# Patient Record
Sex: Female | Born: 1945 | Race: Black or African American | Hispanic: No | Smoking: Never smoker
Health system: Southern US, Community
[De-identification: ages and names within clinical notes are randomized; demographics above are authoritative.]

## PROBLEM LIST (undated history)

## (undated) DIAGNOSIS — M797 Fibromyalgia: Secondary | ICD-10-CM

## (undated) DIAGNOSIS — R000 Tachycardia, unspecified: Secondary | ICD-10-CM

## (undated) DIAGNOSIS — B029 Zoster without complications: Secondary | ICD-10-CM

## (undated) DIAGNOSIS — K219 Gastro-esophageal reflux disease without esophagitis: Secondary | ICD-10-CM

## (undated) DIAGNOSIS — G4730 Sleep apnea, unspecified: Secondary | ICD-10-CM

## (undated) DIAGNOSIS — G8929 Other chronic pain: Secondary | ICD-10-CM

## (undated) DIAGNOSIS — G4733 Obstructive sleep apnea (adult) (pediatric): Secondary | ICD-10-CM

## (undated) DIAGNOSIS — M1990 Unspecified osteoarthritis, unspecified site: Secondary | ICD-10-CM

## (undated) DIAGNOSIS — I10 Essential (primary) hypertension: Secondary | ICD-10-CM

## (undated) DIAGNOSIS — E119 Type 2 diabetes mellitus without complications: Secondary | ICD-10-CM

## (undated) DIAGNOSIS — H6690 Otitis media, unspecified, unspecified ear: Secondary | ICD-10-CM

## (undated) DIAGNOSIS — J45909 Unspecified asthma, uncomplicated: Secondary | ICD-10-CM

## (undated) DIAGNOSIS — J40 Bronchitis, not specified as acute or chronic: Secondary | ICD-10-CM

## (undated) HISTORY — PX: CHOLECYSTECTOMY: SHX55

## (undated) HISTORY — DX: Other chronic pain: G89.29

## (undated) HISTORY — DX: Fibromyalgia: M79.7

## (undated) HISTORY — DX: Sleep apnea, unspecified: G47.30

## (undated) HISTORY — DX: Obstructive sleep apnea (adult) (pediatric): G47.33

## (undated) HISTORY — PX: APPENDECTOMY: SHX54

## (undated) HISTORY — PX: FOOT SURGERY: SHX648

## (undated) HISTORY — DX: Type 2 diabetes mellitus without complications: E11.9

## (undated) HISTORY — DX: Zoster without complications: B02.9

## (undated) HISTORY — PX: ABDOMINAL HYSTERECTOMY: SHX81

## (undated) HISTORY — DX: Unspecified asthma, uncomplicated: J45.909

## (undated) HISTORY — PX: OTHER SURGICAL HISTORY: SHX169

## (undated) HISTORY — DX: Bronchitis, not specified as acute or chronic: J40

## (undated) HISTORY — DX: Gastro-esophageal reflux disease without esophagitis: K21.9

## (undated) HISTORY — PX: PARTIAL HYSTERECTOMY: SHX80

---

## 1988-06-30 HISTORY — PX: PARTIAL COLECTOMY: SHX5273

## 1990-06-30 HISTORY — PX: CERVICAL SPINE SURGERY: SHX589

## 1993-06-30 HISTORY — PX: NEUROPLASTY / TRANSPOSITION MEDIAN NERVE AT CARPAL TUNNEL BILATERAL: SUR894

## 1993-06-30 HISTORY — PX: CHOLECYSTECTOMY: SHX55

## 1993-06-30 HISTORY — PX: APPENDECTOMY: SHX54

## 1993-06-30 HISTORY — PX: BILATERAL SALPINGOOPHORECTOMY: SHX1223

## 1993-06-30 HISTORY — PX: PARTIAL COLECTOMY: SHX5273

## 1997-10-03 ENCOUNTER — Other Ambulatory Visit: Admission: RE | Admit: 1997-10-03 | Discharge: 1997-10-03 | Payer: Self-pay | Admitting: *Deleted

## 1998-06-30 HISTORY — PX: OTHER SURGICAL HISTORY: SHX169

## 1998-10-23 ENCOUNTER — Other Ambulatory Visit: Admission: RE | Admit: 1998-10-23 | Discharge: 1998-10-23 | Payer: Self-pay | Admitting: *Deleted

## 1999-10-22 ENCOUNTER — Encounter: Admission: RE | Admit: 1999-10-22 | Discharge: 1999-10-22 | Payer: Self-pay | Admitting: Unknown Physician Specialty

## 1999-10-22 ENCOUNTER — Encounter: Payer: Self-pay | Admitting: Unknown Physician Specialty

## 1999-11-07 ENCOUNTER — Encounter: Payer: Self-pay | Admitting: Internal Medicine

## 1999-11-07 ENCOUNTER — Encounter: Admission: RE | Admit: 1999-11-07 | Discharge: 1999-11-07 | Payer: Self-pay | Admitting: Internal Medicine

## 1999-12-23 ENCOUNTER — Encounter: Admission: RE | Admit: 1999-12-23 | Discharge: 1999-12-23 | Payer: Self-pay | Admitting: Internal Medicine

## 1999-12-23 ENCOUNTER — Encounter: Payer: Self-pay | Admitting: Internal Medicine

## 2000-05-26 ENCOUNTER — Encounter: Admission: RE | Admit: 2000-05-26 | Discharge: 2000-05-26 | Payer: Self-pay | Admitting: Orthopedic Surgery

## 2000-05-26 ENCOUNTER — Encounter: Payer: Self-pay | Admitting: Orthopedic Surgery

## 2000-11-09 ENCOUNTER — Encounter: Payer: Self-pay | Admitting: Internal Medicine

## 2000-11-09 ENCOUNTER — Encounter: Admission: RE | Admit: 2000-11-09 | Discharge: 2000-11-09 | Payer: Self-pay | Admitting: Internal Medicine

## 2001-11-16 ENCOUNTER — Encounter: Admission: RE | Admit: 2001-11-16 | Discharge: 2001-11-16 | Payer: Self-pay | Admitting: Unknown Physician Specialty

## 2001-11-16 ENCOUNTER — Encounter: Payer: Self-pay | Admitting: Unknown Physician Specialty

## 2002-11-19 ENCOUNTER — Emergency Department (HOSPITAL_COMMUNITY): Admission: EM | Admit: 2002-11-19 | Discharge: 2002-11-19 | Payer: Self-pay | Admitting: Emergency Medicine

## 2002-11-19 ENCOUNTER — Encounter: Payer: Self-pay | Admitting: Emergency Medicine

## 2002-11-22 ENCOUNTER — Encounter: Payer: Self-pay | Admitting: Internal Medicine

## 2003-06-01 ENCOUNTER — Encounter: Admission: RE | Admit: 2003-06-01 | Discharge: 2003-06-01 | Payer: Self-pay | Admitting: Internal Medicine

## 2003-06-28 ENCOUNTER — Ambulatory Visit (HOSPITAL_COMMUNITY): Admission: RE | Admit: 2003-06-28 | Discharge: 2003-06-28 | Payer: Self-pay | Admitting: Cardiology

## 2004-05-10 ENCOUNTER — Encounter: Admission: RE | Admit: 2004-05-10 | Discharge: 2004-05-10 | Payer: Self-pay | Admitting: Internal Medicine

## 2004-06-20 ENCOUNTER — Ambulatory Visit (HOSPITAL_BASED_OUTPATIENT_CLINIC_OR_DEPARTMENT_OTHER): Admission: RE | Admit: 2004-06-20 | Discharge: 2004-06-20 | Payer: Self-pay | Admitting: Internal Medicine

## 2004-07-26 ENCOUNTER — Encounter: Admission: RE | Admit: 2004-07-26 | Discharge: 2004-07-26 | Payer: Self-pay | Admitting: Internal Medicine

## 2004-09-14 ENCOUNTER — Inpatient Hospital Stay (HOSPITAL_COMMUNITY): Admission: EM | Admit: 2004-09-14 | Discharge: 2004-09-18 | Payer: Self-pay | Admitting: Emergency Medicine

## 2004-09-25 ENCOUNTER — Ambulatory Visit: Payer: Self-pay | Admitting: Internal Medicine

## 2004-12-20 ENCOUNTER — Ambulatory Visit: Payer: Self-pay | Admitting: Internal Medicine

## 2005-04-18 ENCOUNTER — Ambulatory Visit: Payer: Self-pay | Admitting: Internal Medicine

## 2005-04-22 ENCOUNTER — Ambulatory Visit: Payer: Self-pay | Admitting: Internal Medicine

## 2005-07-21 ENCOUNTER — Encounter: Admission: RE | Admit: 2005-07-21 | Discharge: 2005-07-21 | Payer: Self-pay | Admitting: Internal Medicine

## 2005-10-08 ENCOUNTER — Encounter: Admission: RE | Admit: 2005-10-08 | Discharge: 2005-10-08 | Payer: Self-pay | Admitting: Internal Medicine

## 2005-12-22 ENCOUNTER — Ambulatory Visit: Payer: Self-pay | Admitting: Internal Medicine

## 2006-04-24 ENCOUNTER — Ambulatory Visit: Payer: Self-pay | Admitting: Internal Medicine

## 2006-11-09 ENCOUNTER — Ambulatory Visit: Payer: Self-pay | Admitting: Internal Medicine

## 2007-01-28 ENCOUNTER — Encounter: Admission: RE | Admit: 2007-01-28 | Discharge: 2007-01-28 | Payer: Self-pay | Admitting: Internal Medicine

## 2007-03-11 ENCOUNTER — Ambulatory Visit: Payer: Self-pay | Admitting: Internal Medicine

## 2007-05-14 ENCOUNTER — Ambulatory Visit: Payer: Self-pay | Admitting: Internal Medicine

## 2007-10-11 ENCOUNTER — Ambulatory Visit: Payer: Self-pay | Admitting: Internal Medicine

## 2007-10-11 DIAGNOSIS — J4521 Mild intermittent asthma with (acute) exacerbation: Secondary | ICD-10-CM | POA: Insufficient documentation

## 2007-10-11 HISTORY — DX: Mild intermittent asthma with (acute) exacerbation: J45.21

## 2007-10-13 ENCOUNTER — Telehealth: Payer: Self-pay | Admitting: Internal Medicine

## 2007-10-17 DIAGNOSIS — J302 Other seasonal allergic rhinitis: Secondary | ICD-10-CM | POA: Insufficient documentation

## 2007-10-17 DIAGNOSIS — E119 Type 2 diabetes mellitus without complications: Secondary | ICD-10-CM | POA: Insufficient documentation

## 2007-10-17 DIAGNOSIS — K219 Gastro-esophageal reflux disease without esophagitis: Secondary | ICD-10-CM | POA: Insufficient documentation

## 2007-10-17 DIAGNOSIS — G4733 Obstructive sleep apnea (adult) (pediatric): Secondary | ICD-10-CM

## 2007-10-17 DIAGNOSIS — J4520 Mild intermittent asthma, uncomplicated: Secondary | ICD-10-CM | POA: Insufficient documentation

## 2007-10-17 DIAGNOSIS — J3089 Other allergic rhinitis: Secondary | ICD-10-CM

## 2007-10-17 HISTORY — DX: Other seasonal allergic rhinitis: J30.2

## 2007-10-17 HISTORY — DX: Mild intermittent asthma, uncomplicated: J45.20

## 2007-10-17 HISTORY — DX: Gastro-esophageal reflux disease without esophagitis: K21.9

## 2007-11-09 ENCOUNTER — Ambulatory Visit: Payer: Self-pay | Admitting: Internal Medicine

## 2008-01-31 ENCOUNTER — Encounter: Admission: RE | Admit: 2008-01-31 | Discharge: 2008-01-31 | Payer: Self-pay | Admitting: Internal Medicine

## 2008-03-24 ENCOUNTER — Ambulatory Visit: Payer: Self-pay | Admitting: Internal Medicine

## 2008-09-08 ENCOUNTER — Ambulatory Visit: Payer: Self-pay | Admitting: Internal Medicine

## 2008-09-19 ENCOUNTER — Ambulatory Visit (HOSPITAL_BASED_OUTPATIENT_CLINIC_OR_DEPARTMENT_OTHER): Admission: RE | Admit: 2008-09-19 | Discharge: 2008-09-20 | Payer: Self-pay | Admitting: Orthopaedic Surgery

## 2009-02-05 ENCOUNTER — Encounter: Admission: RE | Admit: 2009-02-05 | Discharge: 2009-02-05 | Payer: Self-pay | Admitting: Internal Medicine

## 2009-04-04 ENCOUNTER — Ambulatory Visit: Payer: Self-pay | Admitting: Internal Medicine

## 2009-04-05 ENCOUNTER — Ambulatory Visit: Payer: Self-pay | Admitting: Internal Medicine

## 2009-07-06 ENCOUNTER — Encounter: Admission: RE | Admit: 2009-07-06 | Discharge: 2009-07-06 | Payer: Self-pay | Admitting: Internal Medicine

## 2010-02-19 ENCOUNTER — Ambulatory Visit: Payer: Self-pay | Admitting: Internal Medicine

## 2010-02-20 ENCOUNTER — Encounter: Admission: RE | Admit: 2010-02-20 | Discharge: 2010-02-20 | Payer: Self-pay | Admitting: Internal Medicine

## 2010-03-28 ENCOUNTER — Telehealth: Payer: Self-pay | Admitting: Internal Medicine

## 2010-04-20 ENCOUNTER — Encounter: Payer: Self-pay | Admitting: Internal Medicine

## 2010-07-21 ENCOUNTER — Encounter: Payer: Self-pay | Admitting: Orthopaedic Surgery

## 2010-07-21 ENCOUNTER — Encounter: Payer: Self-pay | Admitting: Internal Medicine

## 2010-07-30 NOTE — Assessment & Plan Note (Signed)
Summary: NP follow up - discuss spirometry results   Primary Provider/Referring Provider:  [name]  CC:  abnormal spirometry at walk in clinic .  History of Present Illness: 03/24/08- 65 yo woman returning for f/u. Continues allergy vaccine at 0.3 ml of 1:50. OK at this dose. Highter doses give local reaction. Notices post nasal drip and some sniffing, but nothing purulent, no wheeze and no blood. Sleep issues impacted by need to care for demented husband in hospital. Much stress. She has been sitting up at bedside and often without her cpap. Fragmented sleep.  April 04, 2009- Yearly foillow up visit-brusing at injection site for allergy vaccine.  Needs allergy vaccine refilled.  Has noted easier bruising in general over last several months, not just with allergy shots.Has been giving her own shots for years, holding at 0.3 ml/vial/week of 1:50. Had sinus infection, got Zpak, not quite enough-residual stuffy head congestion, drainage, ear pressure.  February 19, 2010--Presents for an work in visit. Complains of had spirometry at walk-in clinic and was told that she has mild obstruction - Spirometry from walk in clinic showed FEV1 103%. She is very concerned b/c they said she had an obstruction. We repeated this today, she had alot of difficulty with the instructions, FEV1 appeared fine w/ >100%. . She had some sinus symptoms that have now resolved. Denies chest pain, dyspnea, orthopnea, hemoptysis, fever, n/v/d, edema, headache, wheezing.      Medications Prior to Update: 1)  Lyrica 50 Mg  Caps (Pregabalin) .... One Tablet 4 Times Daily 2)  Lopressor 100 Mg  Tabs (Metoprolol Tartrate) .... One Tablet Twice Daily 3)  Hyzaar 50-12.5 Mg  Tabs (Losartan Potassium-Hctz) .... Once Daily 4)  Bayer Aspirin Ec Low Dose 81 Mg  Tbec (Aspirin) .... Take 1 Tablet By Mouth Once A Day 5)  Nexium 40 Mg  Cpdr (Esomeprazole Magnesium) .... Take 1 Tablet By Mouth Once A Day 6)  Calcium 500/d 500-200  Mg-Unit  Tabs (Calcium Carbonate-Vitamin D) .... Take 1 Tablet By Mouth Once A Day 7)  Complete Energy   Tabs (Multiple Vitamins-Minerals) .... Take 1 Tablet By Mouth Once A Day 8)  Flexeril 10 Mg  Tabs (Cyclobenzaprine Hcl) .... As Needed 9)  Diclofenac Sodium 75 Mg  Tbec (Diclofenac Sodium) .... As Needed 10)  Ultram 50 Mg  Tabs (Tramadol Hcl) .... As Needed 11)  Cpap Machine .Marland Kitchen.. 12 12)  Allergy Vaccine Go 0.3 of 1:50 (W-E) 13)  Epipen 0.3 Mg/0.33ml (1:1000)  Devi (Epinephrine Hcl (Anaphylaxis)) .... For Severe Allergic Reaction 14)  Fluticasone Propionate 50 Mcg/act Susp (Fluticasone Propionate) .Marland Kitchen.. 1-2 Sprays Each Nostril Daily 15)  Metformin Hcl 500 Mg Tabs (Metformin Hcl) .... Take 1 By Mouth Two Times A Day 16)  Zithromax Z-Pak 250 Mg Tabs (Azithromycin) .... 2 Today Then One Daily  Current Medications (verified): 1)  Lyrica 50 Mg  Caps (Pregabalin) .... One Tablet 4 Times Daily 2)  Lopressor 100 Mg  Tabs (Metoprolol Tartrate) .... One Tablet Twice Daily 3)  Hyzaar 50-12.5 Mg  Tabs (Losartan Potassium-Hctz) .... Once Daily 4)  Bayer Aspirin Ec Low Dose 81 Mg  Tbec (Aspirin) .... Take 1 Tablet By Mouth Once A Day 5)  Nexium 40 Mg  Cpdr (Esomeprazole Magnesium) .... Take 1 Tablet By Mouth Once A Day 6)  Calcium 500/d 500-200 Mg-Unit  Tabs (Calcium Carbonate-Vitamin D) .... Take 1 Tablet By Mouth Once A Day 7)  Complete Energy   Tabs (Multiple Vitamins-Minerals) .... Take 1  Tablet By Mouth Once A Day 8)  Flexeril 10 Mg  Tabs (Cyclobenzaprine Hcl) .... As Needed 9)  Diclofenac Sodium 75 Mg  Tbec (Diclofenac Sodium) .... As Needed 10)  Ultram 50 Mg  Tabs (Tramadol Hcl) .... As Needed 11)  Cpap Machine .Marland Kitchen.. 12 12)  Allergy Vaccine Go 0.3 of 1:50 (W-E) 13)  Epipen 0.3 Mg/0.35ml (1:1000)  Devi (Epinephrine Hcl (Anaphylaxis)) .... For Severe Allergic Reaction 14)  Fluticasone Propionate 50 Mcg/act Susp (Fluticasone Propionate) .Marland Kitchen.. 1-2 Sprays Each Nostril Daily 15)  Metformin Hcl 500 Mg Tabs  (Metformin Hcl) .... Take 1 By Mouth Two Times A Day 16)  Fish Oil 1000 Mg Caps (Omega-3 Fatty Acids) .... Take 1 Capsule By Mouth Once A Day 17)  Metamucil 0.52 Gm Caps (Psyllium) .... Take 1 Capsule By Mouth Once A Day 18)  Lipitor 10 Mg Tabs (Atorvastatin Calcium) .... Take 1 Tab By Mouth At Bedtime  Allergies (verified): 1)  ! Compazine 2)  ! Percodan 3)  ! 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 adhesions- benign Appendectomy Cholecystectomy right knee arthroscopy right foot surgery bilateral carpal tunnel  Family History: Last updated: 04-10-09 Mother- deceased age 41; stroke/dm Father- died age 53; prostate cancer, Artihritis, multiple myeloma Sibling 1- living age 42  Social History: Last updated: 04-10-09 Patient never smoked.  Widowed with 1 children   Risk Factors: Smoking Status: never (10/11/2007)  Review of Systems      See HPI  Vital Signs:  Patient profile:   65 year old female Height:      59 inches Weight:      165.38 pounds BMI:     33.52 O2 Sat:      95 % on Room air Temp:     98.1 degrees F oral Pulse rate:   75 / minute BP sitting:   124 / 64  (left arm) Cuff size:   regular  Vitals Entered By: Boone Master CNA/MA (February 19, 2010 3:41 PM)  O2 Flow:  Room air CC: abnormal spirometry at walk in clinic  Is Patient Diabetic? Yes Comments Medications reviewed with patient Daytime contact number verified with patient. Boone Master CNA/MA  February 19, 2010 3:41 PM    Physical Exam  Additional Exam:  General: A/Ox3; pleasant and cooperative, NAD, obese SKIN: no rash, lesions NODES: no lymphadenopathy HEENT: Millard/AT, EOM- WNL,  Conjuctivae- clear, PERRLA, TM-left is retractedL, , Throat- clear and wnl, Melampatti II-III NECK: Supple w/ fair ROM, JVD- none, normal carotid impulses w/o bruits  CHEST: Clear to P&A HEART: RRR, no m/g/r heard ABDOMEN: Soft and nl; ZOX:WRUE, nl pulses, no edema  NEURO: Grossly intact to observation      Pulmonary Function Test Date: 02/19/2010 4:03 PM Gender: Female  Pre-Spirometry FVC    Value: 2.28 L/min   % Pred: 115.20 % FEV1    Value: 1.81 L     Pred: 1.53 L     % Pred: 118.40 % FEV1/FVC  Value: 79.75 %     % Pred: 101.50 %  Impression & Recommendations:  Problem # 1:  ASTHMA (ICD-493.90) Lung fxn appears ok  w/ FEV1 >100% no apparent flare.  discussed results with pt.   Problem # 2:  ALLERGIC RHINITIS (ICD-477.9) mild flare Clarinex  or Zyrtec  at bedtime as needed drainage Saline nasal rinse as needed  Veramyst or Nasonex 1 puff two times a day  Please contact office for sooner follow up if symptoms do not improve or worsen  Her updated medication list for this problem includes:    Fluticasone Propionate 50 Mcg/act Susp (Fluticasone propionate) .Marland Kitchen... 1-2 sprays each nostril daily  Medications Added to Medication List This Visit: 1)  Fish Oil 1000 Mg Caps (Omega-3 fatty acids) .... Take 1 capsule by mouth once a day 2)  Metamucil 0.52 Gm Caps (Psyllium) .... Take 1 capsule by mouth once a day 3)  Lipitor 10 Mg Tabs (Atorvastatin calcium) .... Take 1 tab by mouth at bedtime  Complete Medication List: 1)  Lyrica 50 Mg Caps (Pregabalin) .... One tablet 4 times daily 2)  Lopressor 100 Mg Tabs (Metoprolol tartrate) .... One tablet twice daily 3)  Hyzaar 50-12.5 Mg Tabs (Losartan potassium-hctz) .... Once daily 4)  Bayer Aspirin Ec Low Dose 81 Mg Tbec (Aspirin) .... Take 1 tablet by mouth once a day 5)  Nexium 40 Mg Cpdr (Esomeprazole magnesium) .... Take 1 tablet by mouth once a day 6)  Calcium 500/d 500-200 Mg-unit Tabs (Calcium carbonate-vitamin d) .... Take 1  tablet by mouth once a day 7)  Complete Energy Tabs (Multiple vitamins-minerals) .... Take 1 tablet by mouth once a day 8)  Flexeril 10 Mg Tabs (Cyclobenzaprine hcl) .... As needed 9)  Diclofenac Sodium 75 Mg Tbec (Diclofenac sodium) .... As needed 10)  Ultram 50 Mg Tabs (Tramadol hcl) .... As needed 11)  Cpap Machine  .Marland Kitchen.. 12 12)  Allergy Vaccine Go 0.3 of 1:50 (w-e)  13)  Epipen 0.3 Mg/0.18ml (1:1000) Devi (Epinephrine hcl (anaphylaxis)) .... For severe allergic reaction 14)  Fluticasone Propionate 50 Mcg/act Susp (Fluticasone propionate) .Marland Kitchen.. 1-2 sprays each nostril daily 15)  Metformin Hcl 500 Mg Tabs (Metformin hcl) .... Take 1 by mouth two times a day 16)  Fish Oil 1000 Mg Caps (Omega-3 fatty acids) .... Take 1 capsule by mouth once a day 17)  Metamucil 0.52 Gm Caps (Psyllium) .... Take 1 capsule by mouth once a day 18)  Lipitor 10 Mg Tabs (Atorvastatin calcium) .... Take 1 tab by mouth at bedtime  Other Orders: Est. Patient Level III (16109)  Patient Instructions: 1)  Clarinex or Zyrtec  at bedtime as needed drainage 2)  Saline nasal rinse as needed  3)  Veramyst or Nasonex 1 puff two times a day  4)  Please contact office for sooner follow up if symptoms do not improve or worsen    Immunization History:  Influenza Immunization History:    Influenza:  historical (04/30/2009)   CardioPerfect Spirometry  ID: [id number] Patient: Mindy Ingram, BEED DOB: 29-Jul-1945 Age: 65 Years Old Sex: Female Race: Black Physician: Rubye Oaks NP Height: 59 Weight: 165.38 Status: Unconfirmed Past Medical History:  AODM (ICD-250.00) OBSTRUCTIVE SLEEP APNEA (ICD-327.23) GERD (ICD-530.81) ASTHMA (ICD-493.90) ALLERGIC RHINITIS (ICD-477.9) BRONCHITIS (ICD-490)   Recorded: 02/19/2010 4:03 PM  Parameter  Measured Predicted %Predicted FVC     2.28        1.97        115.20 FEV1     1.81        1.53        118.40 FEV1%   79.75        78.61        101.50 PEF    4.51        4.49  100.50   Interpretation:

## 2010-07-30 NOTE — Progress Notes (Signed)
Summary: *****return nurse call****  Phone Note Call from Patient Call back at Home Phone [phone]   Caller: Patient Call For: young Reason for Call: Talk to Nurse Summary of Call: Need a new rx for cpap machine and supplies, also need 2 vials of allergy vaccine mailed to her home. Initial call taken by: Darletta Moll,  March 28, 2010 3:31 PM  Follow-up for Phone Call        [employer]  Need to know name of DME company.  Aundra Millet Reynolds LPN  March 28, 2010 3:59 PM   DME COMPANY IS [facility] SHE IS ELIGABLE FOR A NEW MACHINE AND THE FIXINS.Marland KitchenChantel Bowne  March 29, 2010 3:25 PM  will forward message to both CY to send order to [facility] for new cpap machine and supplies and also to allergy lab so they can ship out her allergy vials.  Aundra Millet Reynolds LPN  March 29, 2010 3:27 PM   Additional Follow-up for Phone Call Additional follow up Details #1::        I will order replacement CPAP machine through [facility] Additional Follow-up by: Waymon Budge MD,  March 29, 2010 5:09 PM    Additional Follow-up for Phone Call Additional follow up Details #2::    Replacement order for CPAP Machine, Mask and supplies, humidifier on pressures of 12 csp faxed to [facility]. Rhonda Cobb  April 01, 2010 9:42 AM Called and [facility] for pt that cpap replacement order has been faxed to [facility] Patient. If pt has any questions, to call me at [phone]. Rhonda Cobb  April 01, 2010 9:45 AM    New/Updated Medications: * CPAP MACHINE 12, HUMIDIFIER [facility] Prescriptions: CPAP MACHINE 12, HUMIDIFIER [facility]  #1 x prn   Entered by:   Waymon Budge MD   Authorized by:   Pulmonary Triage   Signed by:   Waymon Budge MD on 03/29/2010   Method used:   Historical   RxID:   [id number]

## 2010-07-30 NOTE — Letter (Signed)
Summary: CMN for CPAP Supplies/[facility]  CMN for CPAP Supplies/[facility]   Imported By: Sherian Rein 04/25/2010 08:30:38  _____________________________________________________________________  External Attachment:    Type:   Image     Comment:   External Document

## 2010-10-10 LAB — BASIC METABOLIC PANEL
BUN: 17 mg/dL (ref 6–23)
CO2: 27 mEq/L (ref 19–32)
Calcium: 9.3 mg/dL (ref 8.4–10.5)
Chloride: 105 mEq/L (ref 96–112)
Creatinine, Ser: 0.92 mg/dL (ref 0.4–1.2)
GFR calc Af Amer: >60 mL/min (ref >60)
GFR calc non Af Amer: >60 mL/min (ref >60)
Glucose, Bld: 146 mg/dL — ABNORMAL HIGH (ref 70–99)
Potassium: 3.8 mEq/L (ref 3.5–5.1)
Sodium: 139 mEq/L (ref 135–145)

## 2010-10-10 LAB — POCT HEMOGLOBIN-HEMACUE: Hemoglobin: 11.1 g/dL — ABNORMAL LOW (ref 12.0–15.0)

## 2010-11-12 NOTE — Op Note (Signed)
[name], Mindy Ingram            ACCOUNT NO.:  [id number]   MEDICAL RECORD NO.:  [id number]          PATIENT TYPE:  AMB   LOCATION:  DSC                          FACILITY:  [facility]   PHYSICIAN:  Lubertha Basque. Dalldorf, M.D.DATE OF BIRTH:  1946/04/08   DATE OF PROCEDURE:  DATE OF DISCHARGE:                               OPERATIVE REPORT   PREOPERATIVE DIAGNOSES:  1. Right knee torn medial meniscus.  2. Right knee chondromalacia of patella.   POSTOPERATIVE DIAGNOSES:  1. Right knee torn medial and torn lateral menisci.  2. Right knee chondromalacia of patella.   PROCEDURES:  1. Right knee partial medial and partial lateral meniscectomies.  2. Right knee abrasion chondroplasty, patellofemoral.   ANESTHESIA:  General.   ATTENDING SURGEON:  Lubertha Basque. Jerl Santos, MD   ASSISTANT:  Lindwood Qua, PA   INDICATIONS:  The patient is a 65 year old woman with a long history of  right knee pain.  This has persisted despite bracing and supervised  therapy as well as injections.  She has pain which seems to be better at  rest than walk.  An MRI scan shows some degenerative changes, but on  exam, she has pain consistent with the meniscal injury.  She is offered  an arthroscopy.  Informed operative consent was obtained after  discussion of the possible complications including reaction to  anesthesia and infection.   SUMMARY, FINDINGS, AND PROCEDURE:  Under general anesthesia, right knee  arthroscopy was performed.  Suprapatellar pouch was benign while the  patellofemoral joint exhibited some focal breakdown at the apex of the  patella, addressed with an abrasion chondroplasty to bleeding bone at  one small area.  Medial compartment exhibits some grade 3 change, fairly  broad across the medial femoral condyle and thorough chondroplasty was  done.  She did have a far posterior horn medial meniscus tear, addressed  with a 5% partial meniscectomy.  Lateral compartment had no degenerative  changes,  but she did, however, have a free-edge lateral meniscus tear,  addressed with a 5% partial lateral meniscectomy.  The ACL was intact.   DESCRIPTION OF PROCEDURE:  The patient was taken to the operating suite  where general anesthetic was applied without difficulty.  She was  positioned supine and prepped and draped in normal sterile fashion.  After the administration of IV Kefzol, an arthroscopy of the right knee  was performed through a total of 2 portals.  Findings were as noted  above.  Procedure consisted of the partial medial and partial lateral  meniscectomies as well as the abrasion chondroplasty of patellofemoral.  The knee was thoroughly irrigated followed by placement of Marcaine with  epinephrine and morphine.  Adaptic was applied followed by a dry gauze  and a loose Ace wrap.  Estimated blood loss and intraoperative fluids  were obtained from anesthesia records.   DISPOSITION:  The patient was extubated in the operating room and taken  to recovery room in stable condition.  She was to stay overnight for  pain control with probable discharge home in the morning.      Lubertha Basque Jerl Santos, M.D.  Electronically Signed  PGD/MEDQ  D:  09/19/2008  T:  09/20/2008  Job:  [id number]

## 2010-11-12 NOTE — Assessment & Plan Note (Signed)
[facility]                             PULMONARY OFFICE NOTE   TAVI, GAUGHRAN                   MRN:          [id number]  DATE:05/14/2007                            DOB:          1946-03-11    PROBLEM LIST:  1. Allergic rhinitis.  2. Allergic conjunctivitis.  3. Asthma.  4. Esophageal reflux.  5. Obstructive sleep apnea.  6. Diabetes.   HISTORY:  She continues using CPAP at 12-CWP every night and is  satisfied with that.  Allergy vaccine dosing is held at 0.3 of 1:50.  She feels it helps her but higher doses cause local reaction.  She uses  Zyrtec only p.r.n. because of finances and we discussed this.  I have re-  emphasized good sleep hygiene and environmental precautions.   MEDICATIONS:  1. CPAP 12.  2. Hyzaar.  3. Lopressor.  4. Nexium.  5. Zantac 150 mg.  6. Lyrica 50 mg.  7. Dicyclomine 20 mg.  8. Aspirin 81 mg.  9. She does have an EpiPen.   DRUG INTOLERANT:  1. COMPAZINE.  2. VICODIN.  3. PERCODAN.   OBJECTIVE:  VITAL SIGNS:  Weight 176 pounds, BP 114/76, pulse 80, room  air saturation 95%.  GENERAL:  She is obese and alert.  HEENT:  No pressure marks.  There is moderate turbinate edema with  watery clear secretion.  Conjunctivae are clear.  LUNGS:  Clear.  HEART:  Sounds normal.   IMPRESSION:  1. Allergic rhinitis, needs more alternatives.  2. CPAP does well with her sleep apnea.   PLAN:  1. Nasal inhalation Neo-Synephrine.  2. We are refilling her Astelin 1-2 sprays b.i.d. p.r.n.  3. Try Xyzal 5 mg p.r.n. antihistamine with some discussion.  4. We refilled her EpiPen.  5. Schedule return one year, earlier p.r.n.     Clinton D. Maple Hudson, MD, Tonny Bollman, FACP  Electronically Signed    CDY/MedQ  DD: 05/14/2007  DT: 05/16/2007  Job #: [id number]   cc:   [name], M.D.

## 2010-11-15 NOTE — Cardiovascular Report (Signed)
Mindy Ingram, Mindy Ingram            ACCOUNT NO.:  [id number]   MEDICAL RECORD NO.:  [id number]          PATIENT TYPE:  INP   LOCATION:  4731                         FACILITY:  [facility]   PHYSICIAN:  Armanda Magic, M.D.     DATE OF BIRTH:  April 29, 1946   DATE OF PROCEDURE:  DATE OF DISCHARGE:                              CARDIAC CATHETERIZATION   CONSULTING PHYSICIAN:  Armanda Magic, M.D.   REFERRING PHYSICIAN:  [name], M.D.   PROCEDURES:  1.  Left heart catheterization.  2.  Coronary angiography.  3.  Left ventriculography.   OPERATOR:  Armanda Magic, M.D.   INDICATIONS:  Chest pain.   COMPLICATIONS:  None.   INTRAVENOUS ACCESS:  Via the right femoral artery 6-French sheath.   This is a 65 year old black female with a history of atypical chest pain who  had a stress Cardiolite study done a year ago which showed no reducible  ischemia.  She does have a history of hyperlipidemia and hypertension, and a  family history of coronary disease with sudden death and now presents again  with substernal chest pain.  She ruled out for myocardial infarction with  enzymes and now is here to undergo cardiac catheterization for evaluation  chest pain.   The patient was brought to cardiac catheterization laboratory in the fasting  non sedated state.  Informed consent was obtained.  The patient was  connected to a continuous heart rate and pulse oximetry monitoring,  intermittent blood pressure monitoring.  The right groin was prepped and  draped in sterile fashion.  Xylocaine 1% was used for local anesthesia.  Using a modified Seldinger technique, a 6-French sheath was placed in the  right femoral artery.  Under fluoroscopic guidance, a 6-French JL-4 catheter  was placed in the left coronary artery.  Multiple cine films were taken at  30 degree REO, 40 LAO degree views.  This catheter was then exchanged over a  guidewire for 6-French JR-4 catheter which was placed under fluoroscopic  guidance in the right coronary artery.  Multiple cine films taken at 30  degree RAO, 40 degree LAO views.  This catheter was then exchanged over a  guidewire for a 6-French angled pigtail catheter which was placed under  fluoroscopic guidance in the left ventricular cavity.  Left ventriculography  was performed in 30 degree RAO view using total of 30 cc of contrast at 15  cc/sec.  The catheter was then pulled back across the aortic valve with no  significant gradient noted.  At the end of procedure, all catheters and  sheaths were removed.  Manual compression was performed until adequate  hemostasis was obtained.  The patient was transferred back to her room in  stable condition.   RESULTS:  1.  Left main coronary is widely patent. It bifurcates into the left      anterior descending artery and left circumflex artery. The left anterior      descending artery is widely patent throughout the course of the apex      giving rise to two diagonal branches.  The first diagonal branch is  patent but small.  The second diagonal is a moderate size vessel and      widely patent. The left circumflex is widely patent throughout its      course traversing the AV groove, giving rise to three obtuse marginal      branches.  The first two obtuse marginal branches are rather small but      patent.  The third obtuse marginal branch is a large branch which is      widely patent.  2.  Right coronary artery is widely patent throughout its course, except for      luminal irregularities and distally bifurcates into posterior descending      artery and posterior lateral artery both of which are widely patent.   LEFT VENTRICULOGRAPHY:  1.  Shows normal LV systolic function.  2.  Aortic pressure 139/75 mmHg.  3.  LV pressure 139 /8 mmHg.   ASSESSMENT:  1.  Noncardiac chest pain.  2.  Normal coronary arteries, except luminal irregularities.  3.  Normal left ventricle function.   PLAN:  IV fluid and  bedrest for 6 hours.  Okay for discharge home later  today from a cardiac standpoint if groin is stable.       TT/MEDQ  D:  09/17/2004  T:  09/17/2004  Job:  [id number]   cc:   [name], M.D.  [address] [place]  [place] [address]  Fax: [phone]

## 2010-11-15 NOTE — Op Note (Signed)
Mindy Ingram, Mindy Ingram            ACCOUNT NO.:  [id number]   MEDICAL RECORD NO.:  [id number]          PATIENT TYPE:  INP   LOCATION:  4731                         FACILITY:  [facility]   PHYSICIAN:  Cassell Clement, M.D. DATE OF BIRTH:  08/24/1945   DATE OF PROCEDURE:  09/15/2004  DATE OF DISCHARGE:                                 OPERATIVE REPORT   CHIEF COMPLAINT:  Chest pain.   HISTORY:  This is a 65 year old African-American woman, admitted with chest  pressure which began yesterday morning.  It persisted throughout the day,  and she came to the emergency room last night, where she was evaluated by  Dr. Kevan Ny and admitted.  The pain started at rest, and it appeared to  respond briefly to some nitroglycerin (which she had at home).  There was no  diaphoresis, but she felt like hot flashes at times.  She felt weak and  dizzy.  There was no nausea or vomiting.  Of note is the fact that  apparently a year or so ago she did have a stress Cardiolite by Dr. Mayford Knife,  which the patient states was negative.  The patient has multiple risk  factors for coronary disease, including hypertension, hypercholesterolemia  and a positive family history.  She is not diabetic and she is not a smoker.   FAMILY HISTORY:  Positive for coronary disease and sudden cardiac death in  several aunts on her father's side.  Her father himself is living and well.  Her mother died of a stroke and diabetes.   SOCIAL HISTORY:  Revealed that she is a nonsmoker.  She does office work.  She is married and has one child.   OPERATIONS:  Cholecystectomy, appendectomy, partial removal of intestines  for blockage, and carpal tunnel syndrome (both hands).   REVIEW OF SYSTEMS:  Positive for history of GERD.  She denies any recent  genitourinary symptoms.  She has had no cough or sputum production,  hemoptysis or pleurisy.   ALLERGIES:  COMPAZINE, VICODIN.   PHYSICAL EXAMINATION:  VITAL SIGNS:  Blood pressure 96/69, heart  rate 74,  respirations 20, temperature 97, O2 96% on 2L/min.  HEENT:  Negative.  NECK:  Carotids no bruits.  Jugular venous pressure normal.  CHEST:  Clear to percussion and auscultation.  HEART:  No murmur, gallop, rub or click.  There is no chest wall tenderness.  ABDOMEN:  Soft.  Liver and spleen are not enlarged.  EXTREMITIES:  Show good peripheral pulses; no edema, no phlebitis.   EKG:  Shows normal sinus rhythm and nonspecific T-wave flattening; but no  acute changes.   CHEST X-RAY:  Shows no active disease.   LABORATORY DATA:  Blood work is satisfactory, except for potassium of 3.2.  Initial cardiac enzymes are negative.   IMPRESSION:  1.  Chest pain, rule out acute coronary syndrome.  2.  Hypertensive cardiovascular disease.  3.  Hypercholesterolemia.  4.  GERD.   DISPOSITION:  We will continue present medications.  Anticipate possible  cardiac catheterization versus repeat stress Cardiolite on Monday by Dr.  Mayford Knife.      TB/MEDQ  D:  09/15/2004  T:  09/15/2004  Job:  [id number]   cc:   Candyce Churn, M.D.  [address] [place]  [place] [address]  Fax: [phone]   [name], M.D.  [address] [place]  [place] [address]  Fax: [phone]

## 2010-11-15 NOTE — Procedures (Signed)
NAMEFLANNERY, Mindy Ingram            ACCOUNT NO.:  [id number]   MEDICAL RECORD NO.:  [id number]          PATIENT TYPE:  OUT   LOCATION:  SLEEP CENTER                 FACILITY:  [facility]   PHYSICIAN:  Clinton D. Maple Hudson, M.D. DATE OF BIRTH:  [birth year]   DATE OF STUDY:                              NOCTURNAL POLYSOMNOGRAM   REFERRING PHYSICIAN:  Dr. [name].   INDICATION FOR SURGERY:  Hypersomnia with sleep apnea.   EPWORTH SLEEPINESS SCORE:  13/24.   NECK SIZE:  14 inches.   BODY MASS INDEX:  BMI 32, weight 165 pounds.   SLEEP ARCHITECTURE:  Total sleep time 341 minutes with sleep efficiency 72%;  stage I was 8%, stage II 67%, stages III and IV were absent.  REM was 25% of  total sleep time.  Sleep latency 16 minutes.  REM latency 167 minutes.  Awake after sleep onset 116 minutes.  Arousal index 17.   RESPIRATORY DATA:  Slit-study protocol.  RDI 17.9 obstructive events per  hour, indicating mild-to-moderate obstructive sleep apnea/hypopnea syndrome  before CPAP; this included 26 obstructive apneas and 25 hypopneas before  CPAP.  Most events were while sleeping supine.  REM RDI 12 per hour.  CPAP  was titrated to 12 CWP, RDI 0.7 per hour with snoring finally eliminated at  12 CWP.  An Ultra-Mirage full-face mask, size small, was used with heated  humidifier.   OXYGEN DATA:  Very loud snoring with oxygen desaturation to a nadir of 76%  with apneas.  Mean oxygen saturation after CPAP control was 95% to 98% on  room air.   CARDIAC DATA:  Normal sinus rhythm with PVCs.   MOVEMENT/PARASOMNIA:  An occasional leg jerk without significant affect on  sleep.  Bathroom x1.   IMPRESSION/RECOMMENDATION:  Mild-to-moderate obstructive sleep  apnea/hypopnea syndrome, respiratory disturbance index 17.9 per hour with  oxygen desaturation to 76%.  Continuous positive airway pressure titration  to  12 centimeters of water pressure, respiratory disturbance index 0.7 per hour  using a small  Ultra-Mirage full-face mask with heated humidifier.                                                           Clinton D. Maple Hudson, M.D.  Diplomate, [employer]   CDY/MEDQ  D:  06/24/2004 16:02:49  T:  06/25/2004 08:35:57  Job:  [id number]

## 2010-11-15 NOTE — H&P (Signed)
[name], Ingram            ACCOUNT NO.:  [id number]   MEDICAL RECORD NO.:  [id number]          PATIENT TYPE:  INP   LOCATION:  4731                         FACILITY:  [facility]   PHYSICIAN:  Candyce Churn, M.D.DATE OF BIRTH:  10-01-1945   DATE OF ADMISSION:  09/14/2004  DATE OF DISCHARGE:                                HISTORY & PHYSICAL   CHIEF COMPLAINT:  Chest pain.   HISTORY OF PRESENT ILLNESS:  Mindy Ingram is a very pleasant 65 year old  female with a history of feeling weak for several weeks and started feeling  lightheaded and 'woozy last p.m. She felt like she could not get enough air  or in other words could not take a deep enough breath the last p.m. This  a.m. she slept until approximately 11:30 which is very unusual for her. She  just felt exhausted after she woke up. After approximately 30 minutes of  being up she noticed some very deep precordial chest heaviness that radiated  to her left neck and to her left shoulder and also down her right arm. She  states that this was not like my reflux pain. She did have some  diaphoresis with the pain, but no shortness of breath. The pain lasted  fairly severely for about 10 minutes, but then had a lingering discomfort  all day. Two sublingual nitroglycerins at home helped relieve the precordial  pressure and after coming to the emergency room and nitro paste being  applied the chest pressure resolved. She denies any fever, chills, or cough.  She is admitted now to rule out unstable angina. She had negative cardiac  enzymes in the emergency room. She did have a stress Cardiolite, by history,  performed apparently by Dr. Armanda Magic in June 2005 that was within normal  limits.   PAST MEDICAL HISTORY:  1.  Hypertension.  2.  History of arrhythmias--question ectopy.  3.  GERD.  4.  Hypercholesterolemia.  5.  Obesity.  6.  Sleep apnea--she has been on CPAP for the last three months.  7.  Fibromyalgia.  8.   Allergies.  9.  DJD of multiple joints.  10. Constipation.   ALLERGIES:  1.  Mindy Ingram causes dystonia.  2.  Mindy Ingram AND Mindy Ingram have caused her throat to swell.   MEDICATIONS:  1.  Lopressor 100 mg p.o. b.i.d.  2.  Hyzaar 50/12.5 mg one daily.  3.  Lipitor 10 mg daily.  4.  Ranitidine 75 mg daily or Nexium 40 mg daily.  5.  Aspirin 81 mg daily.  6.  Calcium with vitamin D 600 mg daily.  7.  Flexeril 10 mg at night p.r.n.  8.  Ultram 50 mg b.i.d. p.r.n.  9.  Citrucel p.r.n. constipation.  10. Tylenol 650 mg p.o. q.4h. p.r.n.  11. Zyrtec 10 mg daily.  12. Allergy shots weekly.   PAST SURGICAL HISTORY:  1.  Appendectomy.  2.  Cholecystectomy.  3.  Status post intestinal resection for obstruction.  4.  Cervical diskectomy times two.  5.  Carpal tunnel surgery times two.   SOCIAL HISTORY:  The patient is married and quite  happily so. Her husband is  much older--age 36, but is very healthy and energetic. She is a Diplomatic Services operational officer at  [employer]. Denies tobacco or alcohol use.   FAMILY HISTORY:  Father had prostate cancer and strokes. Apparently mother  had coronary artery disease and strokes. She has one sister who is healthy.   REVIEW OF SYSTEMS:  As per HPI.  No recent change in bowel or urinary  habits.   PHYSICAL EXAMINATION:  GENERAL: Obese female in no acute distress. She is  alert and oriented.  VITAL SIGNS: Blood pressure 121/67, temperature 98.4, pulse 62 and regular,  respiratory rate 14. O2 saturation on two liters 99%.  HEENT: Normocephalic and atraumatic. No obvious abnormalities.  NECK: Without JVD or bruits. No thyromegaly.  CHEST: Clear to auscultation.  CARDIAC: Regular rhythm with a 1/6 systolic ejection murmur at the second  intercostal space. No rubs.  ABDOMEN: Soft, nontender. She sounds normal. No obvious organomegaly. She  does have sternal tenderness to palpation of the anterior chest wall. This  is a different pain that she described  having during the day.  EXTREMITIES: Without edema. There are warm cyanosis. Good capillary refill  distally.  NEUROLOGIC: Oriented times three. Nonfocal.  BREASTS/RECTAL/PELVIC EXAM: Performed.   Chest x-ray reveals no active disease. EKG revealed sinus bradycardia at 59  beats per minute. She has flattening of the T waves diffusely, but no ST  segment elevation or depression. Normal axis. White count is 6700,  hemoglobin 13.2, platelet count 217,000. CK in the ER is 130 and 120, two  hours apart, CK-MB was 1.4 and 1.2 respectively, and troponin-I was than  August 04, 2004, test two hours apart.   ASSESSMENT:  A 65 year old female with ischemic heart disease and unstable  angina.  Multiple other medical illnesses as well.   PLAN:  1.  Admit to telemetry bed and use IV nitroglycerin and subcu Lovenox. Will      give nasal O2, check serial enzymes, and EKGs. Will consult cardiology      and Dr. Patty Sermons will see in the a.m.  2.  Hypertension. Continue Lopressor.  3.  Hypercholesterolemia, continue Lipitor.  4.  Fibromyalgia, continue Flexeril.  5.  Gastroesophageal reflux disease--treat with Protonix 40  mg daily.  6.  Degenerative joint disease--use Tylenol p.r.n.  7.  Check D-dimer. Doubt PE, but consider nonetheless.      RNG/MEDQ  D:  09/14/2004  T:  09/15/2004  Job:  [id number]   cc:   Armanda Magic, M.D.  [address]  [place], [place] [address]  Fax: [phone]   Cassell Clement, M.D.  [address]  [place]  [place] [address]  Fax: [phone]   [name], M.D.  [address] [place]  [place] [address]  Fax: [phone]

## 2010-11-15 NOTE — Discharge Summary (Signed)
Mindy Ingram, Mindy Ingram            ACCOUNT NO.:  [id number]   MEDICAL RECORD NO.:  [id number]          PATIENT TYPE:  INP   LOCATION:  4731                         FACILITY:  [facility]   PHYSICIAN:  [name], M.D.    DATE OF BIRTH:  [birth year]   DATE OF ADMISSION:  09/14/2004  DATE OF DISCHARGE:  09/18/2004                                 DISCHARGE SUMMARY   ADMISSION DIAGNOSIS:  Chest pain.   DISCHARGE DIAGNOSES:  1.  Chest pain, questionable etiology.  2.  Hypertension.  3.  Gastroesophageal reflux disease.  4.  Hypercholesterolemia.  5.  Sleep apnea.  6.  Fibromyalgia.   HISTORY OF PRESENT ILLNESS:  The patient is a very nice 65 year old black  female who was admitted with an atypical chest pain syndrome. Please see the  admitting history and physical examination for further detail of that.   HOSPITAL COURSE:  The patient was admitted and on the basis of serial  enzymes and EKG's myocardial infarction was ruled out. She was seen in  consultation by cardiology, Dr. Cassell Clement and he felt the patient  should undergo further cardiac evaluation. The patient had some cardiac  workup in the past, and therefore it was opted to proceed with cardiac  catheterization but this could not be arranged on September 16, 2004 and was  done on September 17, 2004. Findings were of widely patent coronary arteries  with normal left ventricular systolic function. She was kept at bed rest but  could not be discharged that evening and was discharged the next day in  improved condition.   Throughout her hospitalization, her blood pressure remained under good  control and she had no additional complications.   DISCHARGE MEDICATIONS:  1.  Lopressor 100 mg b.i.d.  2.  Hyzaar 50/12.5 mg 1 q.d.  3.  Lipitor 10 mg q.d.  4.  Ranitidine or Nexium q.d.  5.  Aspirin 81 mg q.d.  6.  Calcium plus vitamin D q.d.  7.  Allergy shots as before.  8.  She will continue to use her sleep apnea appliance  nightly.   ACTIVITY:  No restrictions.   DIET:  No added salt.   FOLLOW UP:  She will call to make an appointment to be seen in Dr. Gloris Manchester  Turner's office in two weeks and will call to make an appointment to see me  in approximately three months. She will call me if there is any pain or  swelling occurring around the groin or if she develops any problems.      JO/MEDQ  D:  09/22/2004  T:  09/23/2004  Job:  [id number]

## 2010-11-15 NOTE — Assessment & Plan Note (Signed)
[facility]                               PULMONARY OFFICE NOTE   Mindy Ingram, Mindy Ingram                   MRN:          [id number]  DATE:04/24/2006                            DOB:          02/04/1946    PULMONARY/ALLERGY FOLLOWUP   PROBLEM LIST:  1. Allergic rhinitis.  2. Allergic conjunctivitis.  3. Asthma.  4. Esophageal reflux.  5. Obstructive sleep apnea.  6. Diabetes.   HISTORY:  Has been diagnosed with diabetes. She continues allergy vaccine at  1:50. She does well with this and thinks it helps but she cannot get over  0.5 mL per vile of 1:50 per week without getting a local reaction so she  holds at that dose. She still experiences some __________  nasal congestion  and pressure sensation occasionally and we discussed available supplemental  therapies. She has had a flu shot for this year. She says she cannot go  without her CPAP 12 CWP at night and she never misses it.   MEDICATIONS:  CPAP 12 CWP, Hyzaar, Lopressor, Lipitor, Nexium, Flexeril,  Ultram, allergy vaccine, Epipen for p.r.n.   Drug intolerance to COMPAZINE, VICODIN and PERCODAN.   We reviewed issues of vaccine administration outside of a medical office,  anaphylaxis and epinephrine. She wishes to continuing giving her own shots  and has had no problems.   PLAN:  Will hold her allergy vaccine at present dose. I discussed Claritin  for p.r.n. use and we reviewed nasal steroids. She did not like Astelin.  Schedule return in one year, earlier p.r.n.     Clinton D. Maple Hudson, MD, [facility], FACP    CDY/MedQ  DD: 04/25/2006  DT: 04/27/2006  Job #: [id number]   cc:   [name], M.D.

## 2010-12-31 ENCOUNTER — Ambulatory Visit (INDEPENDENT_AMBULATORY_CARE_PROVIDER_SITE_OTHER): Payer: Medicare Other

## 2010-12-31 DIAGNOSIS — J309 Allergic rhinitis, unspecified: Secondary | ICD-10-CM

## 2011-02-24 ENCOUNTER — Other Ambulatory Visit: Payer: Self-pay | Admitting: Internal Medicine

## 2011-02-24 DIAGNOSIS — Z1231 Encounter for screening mammogram for malignant neoplasm of breast: Secondary | ICD-10-CM

## 2011-03-05 ENCOUNTER — Encounter: Payer: Self-pay | Admitting: Internal Medicine

## 2011-03-06 ENCOUNTER — Ambulatory Visit (INDEPENDENT_AMBULATORY_CARE_PROVIDER_SITE_OTHER): Payer: Medicare Other | Admitting: Internal Medicine

## 2011-03-06 ENCOUNTER — Encounter: Payer: Self-pay | Admitting: Internal Medicine

## 2011-03-06 ENCOUNTER — Ambulatory Visit
Admission: RE | Admit: 2011-03-06 | Discharge: 2011-03-06 | Disposition: A | Discharge status: Home / Self Care | Payer: Medicare Other | Source: Ambulatory Visit | Attending: Internal Medicine | Admitting: Internal Medicine

## 2011-03-06 VITALS — BP 118/74 | HR 59 | Ht 59.0 in | Wt 158.2 lb

## 2011-03-06 DIAGNOSIS — Z23 Encounter for immunization: Secondary | ICD-10-CM

## 2011-03-06 DIAGNOSIS — G4733 Obstructive sleep apnea (adult) (pediatric): Secondary | ICD-10-CM

## 2011-03-06 DIAGNOSIS — Z1231 Encounter for screening mammogram for malignant neoplasm of breast: Secondary | ICD-10-CM

## 2011-03-06 DIAGNOSIS — J309 Allergic rhinitis, unspecified: Secondary | ICD-10-CM

## 2011-03-06 DIAGNOSIS — J45909 Unspecified asthma, uncomplicated: Secondary | ICD-10-CM

## 2011-03-06 MED ORDER — EPINEPHRINE 0.3 MG/0.3ML IJ DEVI: 0.3000 mg | Freq: Once | INTRAMUSCULAR | Status: AC

## 2011-03-06 MED ORDER — FLUTICASONE PROPIONATE 50 MCG/ACT NA SUSP: 2.0000 | Freq: Every day | NASAL | Status: DC

## 2011-03-06 NOTE — Patient Instructions (Signed)
Order- script for replacement CPAP machine, humidifier , Set pressure down to 10  Dx OSA  Script for replacement mask of choice and supplies.    Script sent for epipen,    Printed for fluticasone  Flu vax

## 2011-03-06 NOTE — Assessment & Plan Note (Signed)
inactive

## 2011-03-06 NOTE — Assessment & Plan Note (Signed)
Good compliance and control but with Nasal pillows and weight loss, pressure of 12 is a little high for comfort.  We will try reducing to 10.  Needs replacement for old machine.

## 2011-03-06 NOTE — Progress Notes (Signed)
Subjective:    Patient ID: Mindy Ingram, female    DOB: 10-Jul-1945, 65 y.o.   MRN: [id number]  HPI 03/06/11- 65 year old female never smoker followed for obstructive sleep apnea, allergic rhinitis complicated by DM, bronchitis, GERD. last here 02/19/10 w/ TP.  Now needs script for replacement CPAP machine- more than 65 years old. [facility].  She asks about when to retest. Has been set on 12 but feels high through her nasal pillows. Has been waking with headache between eyes for last week or two. Denies blowing or discharge. Has been trying slowly to lose weight and is down from 168 in 2010.  Allergy vaccine - does well. No more local reaction at 0.3- will hold there.  Old hx asthma, but has not wheezed or needed rescue inhaler in 4 years. She does get winded with choir singing.  Review of Systems Constitutional:   No-   weight loss, night sweats, fevers, chills, fatigue, lassitude. HEENT:   No-  headaches, difficulty swallowing, tooth/dental problems, sore throat,       No-  sneezing, itching, ear ache, nasal congestion, post nasal drip,  CV:  No-   chest pain, orthopnea, PND, swelling in lower extremities, anasarca, dizziness, palpitations Resp: No-   shortness of breath with exertion or at rest.              No-   productive cough,  No non-productive cough,  No-  coughing up of blood.              No-   change in color of mucus.  No- wheezing.   Skin: No-   rash or lesions. GI:  No-   heartburn, indigestion, abdominal pain, nausea, vomiting, diarrhea,                 change in bowel habits, loss of appetite GU: No-   dysuria, change in color of urine, no urgency or frequency.  No- flank pain. MS:  No-   joint pain or swelling.  No- decreased range of motion.  No- back pain. Neuro- grossly normal to observation, Or:  Psych:  No- change in mood or affect. No depression or anxiety.  No memory loss.      Objective:   Physical Exam General- Alert, Oriented,  Affect-appropriate, Distress- none acute Skin- rash-none, lesions- none, excoriation- none Lymphadenopathy- none Head- atraumatic            Eyes- Gross vision intact, PERRLA, conjunctivae clear secretions            Ears- Hearing, canals normal            Nose- turbinate edema, No-Septal dev, mucus, polyps, erosion, perforation             Throat- Mallampati II , mucosa clear , drainage- none, tonsils- atrophic Neck- flexible , trachea midline, no stridor , thyroid nl, carotid no bruit Chest - symmetrical excursion , unlabored           Heart/CV- RRR , no murmur , no gallop  , no rub, nl s1 s2                           - JVD- none , edema- none, stasis changes- none, varices- none           Lung- clear to P&A, wheeze- none, cough- none , dullness-none, rub- none           Chest wall-  Abd- tender-no, distended-no, bowel sounds-present, HSM- no Br/ Gen/ Rectal- Not done, not indicated Extrem- cyanosis- none, clubbing, none, atrophy- none, strength- nl Neuro- grossly intact to observation         Assessment & Plan:

## 2011-03-06 NOTE — Assessment & Plan Note (Addendum)
Good control- will continue vaccine, but will not try to advance beyond 1:50 which is the highest well-tolerated concentration. Headache between eyes w/out obvious sinusitis. Suggested otc med.

## 2011-04-02 ENCOUNTER — Ambulatory Visit (INDEPENDENT_AMBULATORY_CARE_PROVIDER_SITE_OTHER): Payer: Medicare Other

## 2011-04-02 DIAGNOSIS — J309 Allergic rhinitis, unspecified: Secondary | ICD-10-CM

## 2011-07-01 HISTORY — PX: BACK SURGERY: SHX140

## 2011-12-17 ENCOUNTER — Other Ambulatory Visit: Payer: Self-pay | Admitting: *Deleted

## 2011-12-17 DIAGNOSIS — M545 Low back pain: Secondary | ICD-10-CM

## 2012-01-26 DIAGNOSIS — M51379 Other intervertebral disc degeneration, lumbosacral region without mention of lumbar back pain or lower extremity pain: Secondary | ICD-10-CM

## 2012-01-26 DIAGNOSIS — M5137 Other intervertebral disc degeneration, lumbosacral region: Secondary | ICD-10-CM | POA: Insufficient documentation

## 2012-01-26 DIAGNOSIS — M48062 Spinal stenosis, lumbar region with neurogenic claudication: Secondary | ICD-10-CM | POA: Insufficient documentation

## 2012-01-26 DIAGNOSIS — M545 Low back pain, unspecified: Secondary | ICD-10-CM | POA: Insufficient documentation

## 2012-01-26 HISTORY — DX: Low back pain, unspecified: M54.50

## 2012-01-26 HISTORY — DX: Other intervertebral disc degeneration, lumbosacral region: M51.37

## 2012-01-26 HISTORY — DX: Spinal stenosis, lumbar region with neurogenic claudication: M48.062

## 2012-01-26 HISTORY — DX: Other intervertebral disc degeneration, lumbosacral region without mention of lumbar back pain or lower extremity pain: M51.379

## 2012-02-02 ENCOUNTER — Other Ambulatory Visit: Payer: Self-pay | Admitting: Internal Medicine

## 2012-02-02 DIAGNOSIS — Z1231 Encounter for screening mammogram for malignant neoplasm of breast: Secondary | ICD-10-CM

## 2012-02-10 DIAGNOSIS — M4306 Spondylolysis, lumbar region: Secondary | ICD-10-CM

## 2012-02-10 HISTORY — DX: Spondylolysis, lumbar region: M43.06

## 2012-03-05 ENCOUNTER — Ambulatory Visit: Payer: Medicare Other | Admitting: Internal Medicine

## 2012-03-16 ENCOUNTER — Ambulatory Visit: Payer: Medicare Other

## 2012-03-16 DIAGNOSIS — Z981 Arthrodesis status: Secondary | ICD-10-CM | POA: Insufficient documentation

## 2012-03-16 HISTORY — DX: Arthrodesis status: Z98.1

## 2012-03-30 ENCOUNTER — Encounter: Payer: Self-pay | Admitting: Internal Medicine

## 2012-03-30 ENCOUNTER — Ambulatory Visit
Admission: RE | Admit: 2012-03-30 | Discharge: 2012-03-30 | Disposition: A | Discharge status: Home / Self Care | Payer: Medicare Other | Source: Ambulatory Visit | Attending: Internal Medicine | Admitting: Internal Medicine

## 2012-03-30 ENCOUNTER — Ambulatory Visit (INDEPENDENT_AMBULATORY_CARE_PROVIDER_SITE_OTHER): Payer: Medicare Other | Admitting: Internal Medicine

## 2012-03-30 VITALS — BP 122/76 | HR 80 | Ht 59.0 in | Wt 152.2 lb

## 2012-03-30 DIAGNOSIS — G4733 Obstructive sleep apnea (adult) (pediatric): Secondary | ICD-10-CM

## 2012-03-30 DIAGNOSIS — Z1231 Encounter for screening mammogram for malignant neoplasm of breast: Secondary | ICD-10-CM

## 2012-03-30 DIAGNOSIS — Z23 Encounter for immunization: Secondary | ICD-10-CM

## 2012-03-30 NOTE — Progress Notes (Signed)
Subjective:    Patient ID: Mindy Ingram, female    DOB: February 09, 1946, 66 y.o.   MRN: [id number]  HPI 03/06/11- 66 year old female never smoker followed for obstructive sleep apnea, allergic rhinitis complicated by DM, bronchitis, GERD. last here 02/19/10 w/ TP.  Now needs script for replacement CPAP machine- more than 66 years old. [facility].  She asks about when to retest. Has been set on 12 but feels high through her nasal pillows. Has been waking with headache between eyes for last week or two. Denies blowing or discharge. Has been trying slowly to lose weight and is down from 168 in 2010.  Allergy vaccine - does well. No more local reaction at 0.3- will hold there.  Old hx asthma, but has not wheezed or needed rescue inhaler in 4 years. She does get winded with choir singing.   03/30/12-  66 year old female never smoker followed for obstructive sleep apnea, allergic rhinitis complicated by DM, bronchitis, GERD. Got new CPAP 10/ [facility] Has not taken allergy vaccine x 7 weeks due to  Lumbar spinesurgery and nursing home-would like to know how to restart. She had been on allergy vaccine 1:50 , 0.3 mL per vial, G.O.  Review of Systems- see HPI Constitutional:   No-   weight loss, night sweats, fevers, chills, fatigue, lassitude. HEENT:   No-  headaches, difficulty swallowing, tooth/dental problems, sore throat,       No-  sneezing, itching, ear ache, nasal congestion, post nasal drip,  CV:  No-   chest pain, orthopnea, PND, swelling in lower extremities, anasarca, dizziness, palpitations Resp: No-   shortness of breath with exertion or at rest.              No-   productive cough,  No non-productive cough,  No-  coughing up of blood.              No-   change in color of mucus.  No- wheezing.   Skin: No-   rash or lesions. GI:  No-   heartburn, indigestion, abdominal pain, nausea, vomiting,  GU:  MS:  No-   joint pain or swelling.   Neuro- nothing unusual  Psych:  No-  change in mood or affect. No depression or anxiety.  No memory loss.    Objective:   Physical Exam General- Alert, Oriented, Affect-appropriate, Distress- none acute Skin- rash-none, lesions- none, excoriation- none Lymphadenopathy- none Head- atraumatic            Eyes- Gross vision intact, PERRLA, conjunctivae clear secretions            Ears- Hearing, canals normal            Nose- turbinate edema, No-Septal dev, mucus, polyps, erosion, perforation             Throat- Mallampati II , mucosa clear , drainage- none, tonsils- atrophic Neck- flexible , trachea midline, no stridor , thyroid nl, carotid no bruit Chest - symmetrical excursion , unlabored           Heart/CV- RRR , no murmur , no gallop  , no rub, nl s1 s2                           - JVD- none , edema- none, stasis changes- none, varices- none           Lung- clear to P&A, wheeze- none, cough- none , dullness-none, rub- none  Chest wall-  Abd- Br/ Gen/ Rectal- Not done, not indicated Extrem- cyanosis- none, clubbing, none, atrophy- none, strength- nl. Cane. Neuro- grossly intact to observation Assessment & Plan:

## 2012-03-30 NOTE — Patient Instructions (Addendum)
We can continue CPAP at 10 for now. If it begins not to seem right, please let me know.  Start your allergy vaccine back at 0.1 ml each vial,  Then next week 0.2 ml,   Then go back to 0.3 ml/ vial and stay with that dose.   Flu vax  Sample Omnaris or Dymista

## 2012-04-08 NOTE — Assessment & Plan Note (Signed)
Good compliance and control. No changes needed. 

## 2012-08-11 ENCOUNTER — Encounter: Payer: Self-pay | Admitting: Neurology

## 2012-09-21 ENCOUNTER — Ambulatory Visit: Payer: Self-pay | Admitting: Neurology

## 2012-10-08 ENCOUNTER — Other Ambulatory Visit: Payer: Self-pay

## 2012-10-08 MED ORDER — PREGABALIN 50 MG PO CAPS: 50.0000 mg | ORAL_CAPSULE | Freq: Four times a day (QID) | ORAL | Status: DC

## 2012-10-08 NOTE — Telephone Encounter (Signed)
Former [facility] patient.  Assigned to Dr Terrace Arabia

## 2012-10-27 ENCOUNTER — Telehealth: Payer: Self-pay | Admitting: Internal Medicine

## 2012-10-27 NOTE — Telephone Encounter (Signed)
Okay I will make it up and send to Mrs.Henly.

## 2012-10-27 NOTE — Telephone Encounter (Deleted)
Pt.has been in hosp.

## 2012-10-27 NOTE — Telephone Encounter (Signed)
Per CY-Tammy Scott okay to refill vaccine. Keep at 0.66ml per vial per week.

## 2012-10-27 NOTE — Telephone Encounter (Signed)
In your 03/30/12 notes pt. Had been off shots for 7wks. Due to surgery. You advised the nursing home(rehab.) to start her at 0.1 16f 1:50 and build ea. Wk. And hold at 0.3.(tolerated dose) Pt. Claims you knew vac.was a year old. She says we sent her two sets of vaccine which she would have finished in April of 2013 if she was taking it once a wk.(which would have been within the exp. Date.) Obviously she got off schedule again. Pt. Requesting a refill please advise. Her next appt. Is 03/30/13.

## 2012-10-28 ENCOUNTER — Ambulatory Visit (INDEPENDENT_AMBULATORY_CARE_PROVIDER_SITE_OTHER): Payer: Medicare Other

## 2012-10-28 DIAGNOSIS — J309 Allergic rhinitis, unspecified: Secondary | ICD-10-CM

## 2013-02-23 DIAGNOSIS — M546 Pain in thoracic spine: Secondary | ICD-10-CM

## 2013-02-23 HISTORY — DX: Pain in thoracic spine: M54.6

## 2013-03-09 ENCOUNTER — Other Ambulatory Visit: Payer: Self-pay

## 2013-03-09 DIAGNOSIS — Z1231 Encounter for screening mammogram for malignant neoplasm of breast: Secondary | ICD-10-CM

## 2013-03-30 ENCOUNTER — Encounter: Payer: Self-pay | Admitting: Internal Medicine

## 2013-03-30 ENCOUNTER — Ambulatory Visit (INDEPENDENT_AMBULATORY_CARE_PROVIDER_SITE_OTHER): Payer: Medicare Other | Admitting: Internal Medicine

## 2013-03-30 VITALS — BP 120/72 | HR 70 | Ht 59.0 in | Wt 159.2 lb

## 2013-03-30 DIAGNOSIS — G4733 Obstructive sleep apnea (adult) (pediatric): Secondary | ICD-10-CM

## 2013-03-30 DIAGNOSIS — J309 Allergic rhinitis, unspecified: Secondary | ICD-10-CM

## 2013-03-30 DIAGNOSIS — Z23 Encounter for immunization: Secondary | ICD-10-CM

## 2013-03-30 DIAGNOSIS — J302 Other seasonal allergic rhinitis: Secondary | ICD-10-CM

## 2013-03-30 MED ORDER — AZELASTINE-FLUTICASONE 137-50 MCG/ACT NA SUSP: 1.0000 | Freq: Every day | NASAL | Status: DC

## 2013-03-30 MED ORDER — PHENYLEPHRINE HCL 1 % NA SOLN
3.0000 [drp] | Freq: Once | NASAL | Status: AC
  Administered 2013-03-30: 3 [drp] via NASAL

## 2013-03-30 MED ORDER — MOMETASONE FUROATE 50 MCG/ACT NA SUSP: 2.0000 | Freq: Every day | NASAL | Status: DC

## 2013-03-30 NOTE — Assessment & Plan Note (Signed)
Can continue allergy vaccine Mild seasonal exacerbation with eustachian dysfunction aggravated by cerumen retention Plan- neb nasal decongestant, Sample Dymista for now, then try going back to her usual Nasonex. Flu vax

## 2013-03-30 NOTE — Assessment & Plan Note (Signed)
Good compliance and control. She is satisfied life is better with CPAP

## 2013-03-30 NOTE — Progress Notes (Signed)
Subjective:    Patient ID: Mindy Ingram, female    DOB: [birth year], 67 y.o.   MRN: [id number]  HPI 03/06/11- 67 year old female never smoker followed for obstructive sleep apnea, allergic rhinitis complicated by DM, bronchitis, GERD. last here 02/19/10 w/ TP.  Now needs script for replacement CPAP machine- more than 67 years old. [facility].  She asks about when to retest. Has been set on 12 but feels high through her nasal pillows. Has been waking with headache between eyes for last week or two. Denies blowing or discharge. Has been trying slowly to lose weight and is down from 168 in 2010.  Allergy vaccine - does well. No more local reaction at 0.3- will hold there.  Old hx asthma, but has not wheezed or needed rescue inhaler in 4 years. She does get winded with choir singing.   03/30/12-  67 year old female never smoker followed for obstructive sleep apnea, allergic rhinitis complicated by DM, bronchitis, GERD. Got new CPAP 10/ [facility] Has not taken allergy vaccine x 7 weeks due to  Lumbar spinesurgery and nursing home-would like to know how to restart. She had been on allergy vaccine 1:50 , 0.3 mL per vial, G.O.  03/30/13- 67 year old female never smoker followed for obstructive sleep apnea, allergic rhinitis complicated by DM, bronchitis, GERD. FOLLOWS FOR: Wears CPAP 10 every night for about 7-8 hours; pressure working well for patient; DME is [facility]. Still on Allergy vaccine 1:50, 0.3 ml/ vial and doing well. Higher allergy vaccine doses cause local reaction. Throat tickle and drip x 3-4 weeks. Ears popping.  Review of Systems- see HPI Constitutional:   No-   weight loss, night sweats, fevers, chills, fatigue, lassitude. HEENT:   No-  headaches, difficulty swallowing, tooth/dental problems, sore throat,       No-  sneezing, itching, ear ache, nasal congestion,+post nasal drip,  CV:  No-   chest pain, orthopnea, PND, swelling in lower extremities,  anasarca, dizziness, palpitations Resp: No-   shortness of breath with exertion or at rest.              No-   productive cough,  No non-productive cough,  No-  coughing up of blood.              No-   change in color of mucus.  No- wheezing.   Skin: No-   rash or lesions. GI:  No-   heartburn, indigestion, abdominal pain, nausea, vomiting,  GU:  MS:  No-   joint pain or swelling.   Neuro- nothing unusual  Psych:  No- change in mood or affect. No depression or anxiety.  No memory loss.    Objective:   Physical Exam General- Alert, Oriented, Affect-appropriate, Distress- none acute Skin- rash-none, lesions- none, excoriation- none Lymphadenopathy- none Head- atraumatic            Eyes- Gross vision intact, PERRLA, conjunctivae clear secretions            Ears- +Occlusive cerumen            Nose- turbinate edema, No-Septal dev, mucus, polyps, erosion, perforation             Throat- Mallampati II , mucosa clear , drainage- none, tonsils- atrophic Neck- flexible , trachea midline, no stridor , thyroid nl, carotid no bruit Chest - symmetrical excursion , unlabored           Heart/CV- RRR , no murmur , no gallop  ,  no rub, nl s1 s2                           - JVD- none , edema- none, stasis changes- none, varices- none           Lung- clear to P&A, wheeze- none, cough-+slight , dullness-none, rub- none           Chest wall-  Abd- Br/ Gen/ Rectal- Not done, not indicated Extrem- cyanosis- none, clubbing, none, atrophy- none, strength- nl. Cane. Neuro- grossly intact to observation Assessment & Plan:

## 2013-03-30 NOTE — Patient Instructions (Addendum)
We can continue CPAP 10/ [facility]  We can continue Allergy vaccine 1:50, 0.3 ml/ vial GO  Sample Nasonex  Sample Dymista nasal spray    1-2 puffs each nostril once daily at bedtime. Try using this up first, then go back to your Nasonex.  Use ear wax kit or get your ears cleaned out to help the presure and popping in your ears.  Flu vax  Neb neo

## 2013-04-05 ENCOUNTER — Ambulatory Visit: Payer: Medicare Other

## 2013-04-11 ENCOUNTER — Ambulatory Visit: Payer: BC Managed Care – PPO

## 2013-04-25 ENCOUNTER — Ambulatory Visit
Admission: RE | Admit: 2013-04-25 | Discharge: 2013-04-25 | Disposition: A | Discharge status: Home / Self Care | Payer: BC Managed Care – PPO | Source: Ambulatory Visit

## 2013-04-25 DIAGNOSIS — Z1231 Encounter for screening mammogram for malignant neoplasm of breast: Secondary | ICD-10-CM

## 2013-07-07 ENCOUNTER — Other Ambulatory Visit: Payer: Self-pay

## 2013-07-07 DIAGNOSIS — K3189 Other diseases of stomach and duodenum: Secondary | ICD-10-CM

## 2013-07-07 NOTE — Progress Notes (Signed)
Pt has been scheduled for an EUS per [facility] request.  EUS scheduled, pt instructed and medications reviewed.  Patient instructions mailed to home.  Patient to call with any questions or concerns.

## 2013-07-08 ENCOUNTER — Encounter: Payer: Self-pay | Admitting: Gastroenterology

## 2013-07-12 ENCOUNTER — Telehealth: Payer: Self-pay

## 2013-07-12 ENCOUNTER — Encounter (HOSPITAL_COMMUNITY): Payer: Self-pay | Admitting: Pharmacy Technician

## 2013-07-12 ENCOUNTER — Encounter (HOSPITAL_COMMUNITY): Payer: Self-pay | Admitting: *Deleted

## 2013-07-12 NOTE — Telephone Encounter (Signed)
Patient phoned in to see if we could make her EUS appointment later on 07/21/13 than 7:30 AM, they live in [place].  [place] with Dr. Ardis Hughs to move time.  Spoke to [place] in ENDO and she moved her to 10:15 AM, arrive at 8:45 AM which will work much better for her.  Directions were mailed out yesterday by Christian Mate, CMA and patient will call back with any questions once she gets this in the mail.

## 2013-07-21 ENCOUNTER — Ambulatory Visit (HOSPITAL_COMMUNITY)
Admission: RE | Admit: 2013-07-21 | Discharge: 2013-07-21 | Disposition: A | Discharge status: Home / Self Care | Payer: Medicare Other | Source: Ambulatory Visit | Attending: Gastroenterology | Admitting: Gastroenterology

## 2013-07-21 ENCOUNTER — Encounter (HOSPITAL_COMMUNITY): Payer: Self-pay

## 2013-07-21 ENCOUNTER — Encounter (HOSPITAL_COMMUNITY)
Admission: RE | Disposition: A | Discharge status: Home / Self Care | Payer: Self-pay | Source: Ambulatory Visit | Attending: Gastroenterology

## 2013-07-21 ENCOUNTER — Ambulatory Visit (HOSPITAL_COMMUNITY): Payer: Medicare Other | Admitting: Anesthesiology

## 2013-07-21 ENCOUNTER — Encounter (HOSPITAL_COMMUNITY): Payer: Medicare Other | Admitting: Anesthesiology

## 2013-07-21 DIAGNOSIS — K3189 Other diseases of stomach and duodenum: Secondary | ICD-10-CM

## 2013-07-21 DIAGNOSIS — G4733 Obstructive sleep apnea (adult) (pediatric): Secondary | ICD-10-CM | POA: Insufficient documentation

## 2013-07-21 DIAGNOSIS — E119 Type 2 diabetes mellitus without complications: Secondary | ICD-10-CM | POA: Insufficient documentation

## 2013-07-21 DIAGNOSIS — K219 Gastro-esophageal reflux disease without esophagitis: Secondary | ICD-10-CM | POA: Insufficient documentation

## 2013-07-21 DIAGNOSIS — K319 Disease of stomach and duodenum, unspecified: Secondary | ICD-10-CM

## 2013-07-21 DIAGNOSIS — I10 Essential (primary) hypertension: Secondary | ICD-10-CM | POA: Insufficient documentation

## 2013-07-21 HISTORY — DX: Essential (primary) hypertension: I10

## 2013-07-21 HISTORY — DX: Tachycardia, unspecified: R00.0

## 2013-07-21 HISTORY — DX: Unspecified osteoarthritis, unspecified site: M19.90

## 2013-07-21 HISTORY — DX: Other diseases of stomach and duodenum: K31.89

## 2013-07-21 HISTORY — DX: Otitis media, unspecified, unspecified ear: H66.90

## 2013-07-21 HISTORY — PX: EUS: SHX5427

## 2013-07-21 LAB — GLUCOSE, CAPILLARY: Glucose-Capillary: 114 mg/dL — ABNORMAL HIGH (ref 70–99)

## 2013-07-21 SURGERY — UPPER ENDOSCOPIC ULTRASOUND (EUS) LINEAR
Anesthesia: Monitor Anesthesia Care

## 2013-07-21 MED ORDER — PROPOFOL 10 MG/ML IV BOLUS
INTRAVENOUS | Status: AC
  Filled 2013-07-21: qty 20

## 2013-07-21 MED ORDER — LACTATED RINGERS IV SOLN
INTRAVENOUS | Status: DC
  Administered 2013-07-21: 10:00:00 via INTRAVENOUS

## 2013-07-21 MED ORDER — SODIUM CHLORIDE 0.9 % IV SOLN: INTRAVENOUS | Status: DC

## 2013-07-21 MED ORDER — LIDOCAINE HCL (CARDIAC) 20 MG/ML IV SOLN
INTRAVENOUS | Status: DC | PRN
  Administered 2013-07-21: 50 mg via INTRAVENOUS

## 2013-07-21 MED ORDER — LIDOCAINE HCL (CARDIAC) 20 MG/ML IV SOLN
INTRAVENOUS | Status: AC
  Filled 2013-07-21: qty 5

## 2013-07-21 MED ORDER — PROPOFOL INFUSION 10 MG/ML OPTIME
INTRAVENOUS | Status: DC | PRN
  Administered 2013-07-21: 120 ug/kg/min via INTRAVENOUS

## 2013-07-21 NOTE — H&P (Signed)
  HPI: This is a woman noted to have duodenal nodule (submucosal?) by Dr. Lyda Jester EGD last month done for LUQ pain, reflux.  He took biopsies which showed inflammation, mild villous blunting.    Past Medical History  Diagnosis Date  . Type II or unspecified type diabetes mellitus without mention of complication, not stated as uncontrolled   . GERD (gastroesophageal reflux disease)   . Asthma   . Allergic rhinitis   . Bronchitis     none in years  . Fibromyalgia   . Hypertension   . Rapid heartbeat     no metoprolol lin pm  . OSA (obstructive sleep apnea)     cpap setting of 10  . Sleep apnea   . Otitis media     both ears with impacted cerumem  . DJD (degenerative joint disease)     Past Surgical History  Procedure Laterality Date  . Benign lump right axilla    . [place] and bso    . Partial colectomy  1990    benign adhesions  . Appendectomy    . Cholecystectomy    . Right knee arthroscopy    . Right foot surgery  2000    right great toe with artificial bone inserted  . Neuroplasty / transposition median nerve at carpal tunnel bilateral  1995  . Back surgery  2013    lower back with plates and screws  . Cervical spine surgery  1992    at [facility], slight limitation with turning neck  . Abdominal hysterectomy      Current Facility-Administered Medications  Medication Dose Route Frequency Provider Last Rate Last Dose  . 0.9 %  sodium chloride infusion   Intravenous Continuous Milus Banister, MD      . lactated ringers infusion   Intravenous Continuous Milus Banister, MD 125 mL/hr at 07/21/13 0086      Allergies as of 07/07/2013 - Review Complete 03/30/2013  Allergen Reaction Noted  . Hydrocodone-acetaminophen    . Oxycodone-aspirin    . Prochlorperazine edisylate      History reviewed. No pertinent family history.  History   Social History  . Marital Status: Widowed    Spouse Name: N/A    Number of Children: 1  . Years of Education: N/A    Occupational History  . Not on file.   Social History Main Topics  . Smoking status: Never Smoker   . Smokeless tobacco: Never Used  . Alcohol Use: No  . Drug Use: No  . Sexual Activity: Not on file   Other Topics Concern  . Not on file   Social History Narrative  . No narrative on file      Physical Exam: BP 138/90  Temp(Src) 98.2 F (36.8 C) (Oral)  Resp 16  SpO2 96% Constitutional: generally well-appearing Psychiatric: alert and oriented x3 Abdomen: soft, nontender, nondistended, no obvious ascites, no peritoneal signs, normal bowel sounds     Assessment and plan: 68 y.o. female with with duodenal nodule  For EUS today

## 2013-07-21 NOTE — Transfer of Care (Signed)
Immediate Anesthesia Transfer of Care Note  Patient: Mindy Ingram  Procedure(s) Performed: Procedure(s) (LRB): UPPER ENDOSCOPIC ULTRASOUND (EUS) LINEAR (N/A)  Patient Location: PACU  Anesthesia Type: MAC  Level of Consciousness: sedated, patient cooperative and responds to stimulation  Airway & Oxygen Therapy: Patient Spontanous Breathing and Patient connected to face mask oxgen  Post-op Assessment: Report given to PACU RN and Post -op Vital signs reviewed and stable  Post vital signs: Reviewed and stable  Complications: No apparent anesthesia complications

## 2013-07-21 NOTE — Op Note (Signed)
[facility] [place], [address]   ENDOSCOPIC ULTRASOUND PROCEDURE REPORT  PATIENT: Mindy, Ingram  MR#: [id number] BIRTHDATE: [birth year]  GENDER: Female ENDOSCOPIST: Milus Banister, MD REFERRED BY:  Kyra Leyland, M.D. PROCEDURE DATE:  07/21/2013 PROCEDURE:   Upper EUS ASA CLASS:      Class II INDICATIONS:   1.  abnormal duodenum on recent EGD by Dr. Lyda Jester. MEDICATIONS: MAC sedation, administered by CRNA  DESCRIPTION OF PROCEDURE:   After the risks benefits and alternatives of the procedure were  explained, informed consent was obtained. The patient was then placed in the left, lateral, decubitus postion and IV sedation was administered. Throughout the procedure, the patients blood pressure, pulse and oxygen saturations were monitored continuously.  Under direct visualization, the EUS scope [id number]  endoscope was introduced through the mouth  and advanced to the second portion of the duodenum .  Water was used as necessary to provide an acoustic interface.  Upon completion of the imaging, water was removed and the patient was sent to the recovery room in satisfactory condition.   Endoscopic findings: 1. In duodenal bulb there was a soft nodular area, about 1cm across. This was not clearly neoplastic appearing. 2. UGI tract was otherwise normal.  EUS findings: 1. The duodenal bulb nodularity corresponded with a thicker than usual mucosal, deep mucosal layers by EUS. There was no discrete mass lesions. 2. No perigastric, periportal adenopathy. 3. Limited views of the pancreas, left lobe of liver, spleen were all normal  Impression: The duodenal nodularity is not associated with any discrete masses, tumors.  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 Previous biopsies shows no sign of neoplasia.  I recommend she be followed clinically without need for further dedicated  imaging,surveillance of the duodenum unless new symptoms arise.   _______________________________ eSigned:  Milus Banister, MD 07/21/2013 11:44 AM

## 2013-07-21 NOTE — Anesthesia Postprocedure Evaluation (Signed)
  Anesthesia Post-op Note  Patient: Mindy Ingram  Procedure(s) Performed: Procedure(s) (LRB): UPPER ENDOSCOPIC ULTRASOUND (EUS) LINEAR (N/A)  Patient Location: PACU  Anesthesia Type: MAC  Level of Consciousness: awake and alert   Airway and Oxygen Therapy: Patient Spontanous Breathing  Post-op Pain: mild  Post-op Assessment: Post-op Vital signs reviewed, Patient's Cardiovascular Status Stable, Respiratory Function Stable, Patent Airway and No signs of Nausea or vomiting  Last Vitals:  Filed Vitals:   07/21/13 1142  BP: 138/89  Pulse: 79  Temp: 36.9 C  Resp: 16    Post-op Vital Signs: stable   Complications: No apparent anesthesia complications

## 2013-07-21 NOTE — Anesthesia Preprocedure Evaluation (Signed)
Anesthesia Evaluation  Patient identified by MRN, date of birth, ID band Patient awake    Reviewed: Allergy & Precautions, H&P , NPO status , Patient's Chart, lab work & pertinent test results  Airway Mallampati: II TM Distance: >3 FB Neck ROM: Full    Dental no notable dental hx. (+) Edentulous Upper and Upper Dentures   Pulmonary neg pulmonary ROS, asthma , sleep apnea and Continuous Positive Airway Pressure Ventilation ,  breath sounds clear to auscultation  Pulmonary exam normal       Cardiovascular hypertension, Pt. on medications negative cardio ROS  Rhythm:Regular Rate:Normal     Neuro/Psych negative neurological ROS  negative psych ROS   GI/Hepatic negative GI ROS, Neg liver ROS,   Endo/Other  negative endocrine ROSdiabetes, Type 2, Oral Hypoglycemic Agents  Renal/GU negative Renal ROS  negative genitourinary   Musculoskeletal negative musculoskeletal ROS (+)   Abdominal   Peds negative pediatric ROS (+)  Hematology negative hematology ROS (+)   Anesthesia Other Findings   Reproductive/Obstetrics negative OB ROS                           Anesthesia Physical Anesthesia Plan  ASA: II  Anesthesia Plan: MAC   Post-op Pain Management:    Induction:   Airway Management Planned:   Additional Equipment:   Intra-op Plan:   Post-operative Plan: Extubation in OR  Informed Consent: I have reviewed the patients History and Physical, chart, labs and discussed the procedure including the risks, benefits and alternatives for the proposed anesthesia with the patient or authorized representative who has indicated his/her understanding and acceptance.   Dental advisory given  Plan Discussed with: CRNA  Anesthesia Plan Comments:         Anesthesia Quick Evaluation

## 2013-07-21 NOTE — Discharge Instructions (Signed)
YOU HAD AN ENDOSCOPIC PROCEDURE TODAY: Refer to the procedure report that was given to you for any specific questions about what was found during the examination.  If the procedure report does not answer your questions, please call your gastroenterologist to clarify. ° °YOU SHOULD EXPECT: Some feelings of bloating in the abdomen. Passage of more gas than usual.  Walking can help get rid of the air that was put into your GI tract during the procedure and reduce the bloating. If you had a lower endoscopy (such as a colonoscopy or flexible sigmoidoscopy) you may notice spotting of blood in your stool or on the toilet paper.  ° °DIET: Your first meal following the procedure should be a light meal and then it is ok to progress to your normal diet.  A half-sandwich or bowl of soup is an example of a good first meal.  Heavy or fried foods are harder to digest and may make you feel nasueas or bloated.  Drink plenty of fluids but you should avoid alcoholic beverages for 24 hours. ° °ACTIVITY: Your care partner should take you home directly after the procedure.  You should plan to take it easy, moving slowly for the rest of the day.  You can resume normal activity the day after the procedure however you should NOT DRIVE or use heavy machinery for 24 hours (because of the sedation medicines used during the test).   ° °SYMPTOMS TO REPORT IMMEDIATELY  °A gastroenterologist can be reached at any hour.  Please call your doctor's office for any of the following symptoms: ° °· Following lower endoscopy (colonoscopy, flexible sigmoidoscopy) ° Excessive amounts of blood in the stool ° Significant tenderness, worsening of abdominal pains ° Swelling of the abdomen that is new, acute ° Fever of 100° or higher °· Following upper endoscopy (EGD, EUS, ERCP) ° Vomiting of blood or coffee ground material ° New, significant abdominal pain ° New, significant chest pain or pain under the shoulder blades ° Painful or persistently difficult  swallowing ° New shortness of breath ° Black, tarry-looking stools ° °FOLLOW UP: °If any biopsies were taken you will be contacted by phone or by letter within the next 1-3 weeks.  Call your gastroenterologist if you have not heard about the biopsies in 3 weeks.  °Please also call your gastroenterologist's office with any specific questions about appointments or follow up tests. °Gastrointestinal Endoscopy °Care After °Refer to this sheet in the next few weeks. These instructions provide you with information on caring for yourself after your procedure. Your caregiver may also give you more specific instructions. Your treatment has been planned according to current medical practices, but problems sometimes occur. Call your caregiver if you have any problems or questions after your procedure. °HOME CARE INSTRUCTIONS °· If you were given medicine to help you relax (sedative), do not drive, operate machinery, or sign important documents for 24 hours. °· Avoid alcohol and hot or warm beverages for the first 24 hours after the procedure. °· Only take over-the-counter or prescription medicines for pain, discomfort, or fever as directed by your caregiver. You may resume taking your normal medicines unless your caregiver tells you otherwise. Ask your caregiver when you may resume taking medicines that may cause bleeding, such as aspirin, clopidogrel, or warfarin. °· You may return to your normal diet and activities on the day after your procedure, or as directed by your caregiver. Walking may help to reduce any bloated feeling in your abdomen. °· Drink enough fluids to   keep your urine clear or pale yellow. °· You may gargle with salt water if you have a sore throat. °SEEK IMMEDIATE MEDICAL CARE IF: °· You have severe nausea or vomiting. °· You have severe abdominal pain, abdominal cramps that last longer than 6 hours, or abdominal swelling (distention). °· You have severe shoulder or back pain. °· You have trouble  swallowing. °· You have shortness of breath, your breathing is shallow, or you are breathing faster than normal. °· You have a fever or a rapid heartbeat. °· You vomit blood or material that looks like coffee grounds. °· You have bloody, black, or tarry stools. °MAKE SURE YOU: °· Understand these instructions. °· Will watch your condition. °· Will get help right away if you are not doing well or get worse. °Document Released: 01/29/2004 Document Revised: 12/16/2011 Document Reviewed: 09/16/2011 °ExitCare® Patient Information ©2014 [employer]. ° °

## 2013-07-21 NOTE — Preoperative (Signed)
Beta Blockers   Reason not to administer Beta Blockers:Not Applicable 

## 2013-07-22 ENCOUNTER — Encounter (HOSPITAL_COMMUNITY): Payer: Self-pay | Admitting: Gastroenterology

## 2014-03-30 ENCOUNTER — Encounter (INDEPENDENT_AMBULATORY_CARE_PROVIDER_SITE_OTHER): Payer: Self-pay

## 2014-03-30 ENCOUNTER — Ambulatory Visit (INDEPENDENT_AMBULATORY_CARE_PROVIDER_SITE_OTHER): Payer: Medicare Other | Admitting: Internal Medicine

## 2014-03-30 ENCOUNTER — Encounter: Payer: Self-pay | Admitting: Internal Medicine

## 2014-03-30 VITALS — BP 128/80 | HR 69 | Ht 59.0 in | Wt 152.0 lb

## 2014-03-30 DIAGNOSIS — J3089 Other allergic rhinitis: Secondary | ICD-10-CM

## 2014-03-30 DIAGNOSIS — G4733 Obstructive sleep apnea (adult) (pediatric): Secondary | ICD-10-CM

## 2014-03-30 DIAGNOSIS — J302 Other seasonal allergic rhinitis: Secondary | ICD-10-CM

## 2014-03-30 DIAGNOSIS — J309 Allergic rhinitis, unspecified: Secondary | ICD-10-CM

## 2014-03-30 DIAGNOSIS — Z23 Encounter for immunization: Secondary | ICD-10-CM

## 2014-03-30 NOTE — Progress Notes (Signed)
Subjective:    Patient ID: Mindy Ingram, female    DOB: 1946/05/30, 68 y.o.   MRN: [id number]  HPI 03/06/11- 68 year old female never smoker followed for obstructive sleep apnea, allergic rhinitis complicated by DM, bronchitis, GERD. last here 02/19/10 w/ TP.  Now needs script for replacement CPAP machine- more than 68 years old. [facility].  She asks about when to retest. Has been set on 12 but feels high through her nasal pillows. Has been waking with headache between eyes for last week or two. Denies blowing or discharge. Has been trying slowly to lose weight and is down from 168 in 2010.  Allergy vaccine - does well. No more local reaction at 0.3- will hold there.  Old hx asthma, but has not wheezed or needed rescue inhaler in 4 years. She does get winded with choir singing.   03/30/12-  68 year old female never smoker followed for obstructive sleep apnea, allergic rhinitis complicated by DM, bronchitis, GERD. Got new CPAP 10/ [facility] Has not taken allergy vaccine x 7 weeks due to  Lumbar spinesurgery and nursing home-would like to know how to restart. She had been on allergy vaccine 1:50 , 0.3 mL per vial, G.O.  03/30/13- 68 year old female never smoker followed for obstructive sleep apnea, allergic rhinitis complicated by DM, bronchitis, GERD. FOLLOWS FOR: Wears CPAP 10 every night for about 7-8 hours; pressure working well for patient; DME is [facility]. Still on Allergy vaccine 1:50, 0.3 ml/ vial and doing well. Higher allergy vaccine doses cause local reaction. Throat tickle and drip x 3-4 weeks. Ears popping.  03/30/14- 68 year old female never smoker followed for obstructive sleep apnea, allergic rhinitis complicated by DM, bronchitis, GERD. FOLLOWS IRS:WNIOE CPAP 10/ [facility] avg.7-8 hrs. at  night,pr. good,fitting good,had bronchitis 2 wks. ago,no cough,low energy level,no sob Curious about trying a little increase in pressure.  Allergy vaccine 1:50  GO, 0.3, Higher doses caused local reaction- QUIT VACCINE 3 months ago. Had an acute bronchitis 1 month ago, resoved wigth prednisone and antibiotic.Marland Kitchen  Review of Systems- see HPI Constitutional:   No-   weight loss, night sweats, fevers, chills, fatigue, lassitude. HEENT:   No-  headaches, difficulty swallowing, tooth/dental problems, sore throat,       No-  sneezing, itching, ear ache, +nasal congestion,+post nasal drip,  CV:  No-   chest pain, orthopnea, PND, swelling in lower extremities, anasarca, dizziness, palpitations Resp: No-   shortness of breath with exertion or at rest.              No-   productive cough,  No non-productive cough,  No-  coughing up of blood.              No-   change in color of mucus.  No- wheezing.   Skin: No-   rash or lesions. GI:  No-   heartburn, indigestion, abdominal pain, nausea, vomiting,  GU:  MS:  No-   joint pain or swelling.   Neuro- nothing unusual  Psych:  No- change in mood or affect. No depression or anxiety.  No memory loss.    Objective:   Physical Exam General- Alert, Oriented, Affect-appropriate, Distress- none acute Skin- rash-none, lesions- none, excoriation- none Lymphadenopathy- none Head- atraumatic            Eyes- Gross vision intact, PERRLA, conjunctivae clear secretions            Ears- +Occlusive cerumen  Nose- +turbinate edema, No-Septal dev, mucus+, No-polyps, erosion, perforation             Throat- Mallampati II , mucosa clear , drainage- none, tonsils- atrophic Neck- flexible , trachea midline, no stridor , thyroid nl, carotid no bruit Chest - symmetrical excursion , unlabored           Heart/CV- RRR , no murmur , no gallop  , no rub, nl s1 s2                           - JVD- none , edema- none, stasis changes- none, varices- none           Lung- clear to P&A, wheeze- none, cough-+slight , dullness-none, rub- none           Chest wall-  Abd- Br/ Gen/ Rectal- Not done, not indicated Extrem- cyanosis- none,  clubbing, none, atrophy- none, strength- nl. Cane. Neuro- grossly intact to observation Assessment & Plan:

## 2014-03-30 NOTE — Patient Instructions (Addendum)
Flu vax  Suggest you try Mucinex if you are having trouble getting rid of the last effects of the bronchitis  Order- DME [facility]    Download CPAP for pressure compliance         Dx OSA           You can call us if you want to try changing your CPAP pressure  We are stopping allergy shots and will watch to see how you do.  You can use salt water nasal spray (saline) as much as you want.   Flonase and Nasacort are now over the counter for daily use if needed

## 2014-03-31 NOTE — Assessment & Plan Note (Addendum)
Good compliance and control. Plan- she will call for CPAP pressure change if she decides to try up to 11.Marland Kitchen Request download.

## 2014-03-31 NOTE — Assessment & Plan Note (Signed)
Persistent mild rhinitis Plan- allergy vaccine discontinued few months ago, so we will watch to see how she does.

## 2014-05-02 ENCOUNTER — Ambulatory Visit
Admission: RE | Admit: 2014-05-02 | Discharge: 2014-05-02 | Disposition: A | Discharge status: Home / Self Care | Payer: Medicare Other | Source: Ambulatory Visit

## 2014-05-02 ENCOUNTER — Other Ambulatory Visit: Payer: Self-pay

## 2014-05-02 ENCOUNTER — Encounter (INDEPENDENT_AMBULATORY_CARE_PROVIDER_SITE_OTHER): Payer: Self-pay

## 2014-05-02 DIAGNOSIS — Z1231 Encounter for screening mammogram for malignant neoplasm of breast: Secondary | ICD-10-CM

## 2014-05-05 ENCOUNTER — Encounter: Payer: Self-pay | Admitting: Internal Medicine

## 2014-09-20 ENCOUNTER — Other Ambulatory Visit: Payer: BC Managed Care – PPO

## 2014-09-20 ENCOUNTER — Other Ambulatory Visit: Payer: Self-pay | Admitting: Otolaryngology

## 2014-09-20 DIAGNOSIS — M542 Cervicalgia: Secondary | ICD-10-CM

## 2014-10-11 ENCOUNTER — Ambulatory Visit
Admission: RE | Admit: 2014-10-11 | Discharge: 2014-10-11 | Disposition: A | Discharge status: Home / Self Care | Payer: PPO | Source: Ambulatory Visit | Attending: Otolaryngology | Admitting: Otolaryngology

## 2014-10-11 ENCOUNTER — Other Ambulatory Visit: Payer: PPO

## 2014-10-11 DIAGNOSIS — M542 Cervicalgia: Secondary | ICD-10-CM

## 2014-10-11 MED ORDER — IOPAMIDOL (ISOVUE-300) INJECTION 61%
75.0000 mL | Freq: Once | INTRAVENOUS | Status: AC | PRN
  Administered 2014-10-11: 75 mL via INTRAVENOUS

## 2014-10-24 ENCOUNTER — Telehealth: Payer: Self-pay | Admitting: Internal Medicine

## 2014-10-24 DIAGNOSIS — G4733 Obstructive sleep apnea (adult) (pediatric): Secondary | ICD-10-CM

## 2014-10-24 NOTE — Telephone Encounter (Signed)
Left message for patient that we have taken care of the request; Order sent to [facility]. Pt will call back if any other questions or concerns at this time.

## 2014-11-24 ENCOUNTER — Ambulatory Visit: Admit: 2014-11-24 | Payer: Self-pay | Admitting: Otolaryngology

## 2014-11-24 SURGERY — LARYNGOSCOPY, DIRECT
Anesthesia: General

## 2015-01-23 DIAGNOSIS — E785 Hyperlipidemia, unspecified: Secondary | ICD-10-CM | POA: Insufficient documentation

## 2015-01-23 DIAGNOSIS — I10 Essential (primary) hypertension: Secondary | ICD-10-CM | POA: Insufficient documentation

## 2015-01-23 DIAGNOSIS — I152 Hypertension secondary to endocrine disorders: Secondary | ICD-10-CM

## 2015-01-23 DIAGNOSIS — E1159 Type 2 diabetes mellitus with other circulatory complications: Secondary | ICD-10-CM

## 2015-01-23 HISTORY — DX: Essential (primary) hypertension: I10

## 2015-01-23 HISTORY — DX: Type 2 diabetes mellitus with other circulatory complications: E11.59

## 2015-01-23 HISTORY — DX: Hypertension secondary to endocrine disorders: I15.2

## 2015-01-23 HISTORY — DX: Hyperlipidemia, unspecified: E78.5

## 2015-02-20 ENCOUNTER — Encounter: Payer: Self-pay | Admitting: Internal Medicine

## 2015-04-02 ENCOUNTER — Ambulatory Visit: Payer: Medicare Other | Admitting: Internal Medicine

## 2015-04-03 ENCOUNTER — Ambulatory Visit (INDEPENDENT_AMBULATORY_CARE_PROVIDER_SITE_OTHER): Payer: PPO | Admitting: Internal Medicine

## 2015-04-03 ENCOUNTER — Encounter: Payer: Self-pay | Admitting: Internal Medicine

## 2015-04-03 ENCOUNTER — Other Ambulatory Visit: Payer: PPO

## 2015-04-03 VITALS — BP 130/72 | HR 71 | Ht 59.0 in | Wt 153.4 lb

## 2015-04-03 DIAGNOSIS — R21 Rash and other nonspecific skin eruption: Secondary | ICD-10-CM | POA: Diagnosis not present

## 2015-04-03 DIAGNOSIS — G4733 Obstructive sleep apnea (adult) (pediatric): Secondary | ICD-10-CM | POA: Diagnosis not present

## 2015-04-03 DIAGNOSIS — Z23 Encounter for immunization: Secondary | ICD-10-CM

## 2015-04-03 DIAGNOSIS — J40 Bronchitis, not specified as acute or chronic: Secondary | ICD-10-CM | POA: Diagnosis not present

## 2015-04-03 NOTE — Progress Notes (Signed)
Subjective:    Patient ID: Mindy Ingram, female    DOB: 15-Jan-1946, 69 y.o.   MRN: [id number]  HPI 03/06/11- 69 year old female never smoker followed for obstructive sleep apnea, allergic rhinitis complicated by DM, bronchitis, GERD. last here 02/19/10 w/ TP.  Now needs script for replacement CPAP machine- more than 69 years old. [facility].  She asks about when to retest. Has been set on 12 but feels high through her nasal pillows. Has been waking with headache between eyes for last week or two. Denies blowing or discharge. Has been trying slowly to lose weight and is down from 168 in 2010.  Allergy vaccine - does well. No more local reaction at 0.3- will hold there.  Old hx asthma, but has not wheezed or needed rescue inhaler in 4 years. She does get winded with choir singing.   03/30/12-  69 year old female never smoker followed for obstructive sleep apnea, allergic rhinitis complicated by DM, bronchitis, GERD. Got new CPAP 10/ [facility] Has not taken allergy vaccine x 7 weeks due to  Lumbar spinesurgery and nursing home-would like to know how to restart. She had been on allergy vaccine 1:50 , 0.3 mL per vial, G.O.  03/30/13- 69 year old female never smoker followed for obstructive sleep apnea, allergic rhinitis complicated by DM, bronchitis, GERD. FOLLOWS FOR: Wears CPAP 10 every night for about 7-8 hours; pressure working well for patient; DME is [facility]. Still on Allergy vaccine 1:50, 0.3 ml/ vial and doing well. Higher allergy vaccine doses cause local reaction. Throat tickle and drip x 3-4 weeks. Ears popping.  03/30/14- 69 year old female never smoker followed for obstructive sleep apnea, allergic rhinitis complicated by DM, bronchitis, GERD. FOLLOWS ZDG:UYQIH CPAP 10/ [facility] avg.7-8 hrs. at  night,pr. good,fitting good,had bronchitis 2 wks. ago,no cough,low energy level,no sob Curious about trying a little increase in pressure.  Allergy vaccine 1:50  GO, 0.3, Higher doses caused local reaction- QUIT VACCINE 3 months ago. Had an acute bronchitis 1 month ago, resoved wigth prednisone and antibiotic..  04/02/15- 69 year old female never smoker followed for obstructive sleep apnea, allergic rhinitis/ quit vaccine, complicated by DM, bronchitis, GERD. CPAP 10/[facility]. Pressure is comfortable Notices occasionally raspy breathing without wheeze or significant cough. Does not have inhaler. She had a rash on her low back which dermatology set was not shingles. She thinks they called an atopic dermatitis. She asks about potential allergy to peanuts and wheat. Discussed available testing. Office Spirometry 04/03/2015- WNL FVC 2.14/110%, FEV1 1.94/128%, FEV1/FVC 0.91   Review of Systems- see HPI Constitutional:   No-   weight loss, night sweats, fevers, chills, fatigue, lassitude. HEENT:   No-  headaches, difficulty swallowing, tooth/dental problems, sore throat,       No-  sneezing, itching, ear ache, +nasal congestion,+post nasal drip,  CV:  No-   chest pain, orthopnea, PND, swelling in lower extremities, anasarca, dizziness, palpitations Resp: No-   shortness of breath with exertion or at rest.              No-   productive cough,  No non-productive cough,  No-  coughing up of blood.              No-   change in color of mucus.  No- wheezing.   Skin: +HPI GI:  No-   heartburn, indigestion, abdominal pain, nausea, vomiting,  GU:  MS:  No-   joint pain or swelling.   Neuro- nothing unusual  Psych:  No-  change in mood or affect. No depression or anxiety.  No memory loss.    Objective:   Physical Exam General- Alert, Oriented, Affect-appropriate, Distress- none acute Skin- rash-none, lesions- none, excoriation- none Lymphadenopathy- none Head- atraumatic            Eyes- Gross vision intact, PERRLA, conjunctivae clear secretions            Ears- +Occlusive cerumen            Nose- +turbinate edema, No-Septal dev, mucus+, No-polyps, erosion,  perforation             Throat- Mallampati II , mucosa clear , drainage- none, tonsils- atrophic Neck- flexible , trachea midline, no stridor , thyroid nl, carotid no bruit Chest - symmetrical excursion , unlabored           Heart/CV- RRR , no murmur , no gallop  , no rub, nl s1 s2                           - JVD- none , edema- none, stasis changes- none, varices- none           Lung- clear to P&A, wheeze- none, cough-+slight , dullness-none, rub- none           Chest wall-  Abd- Br/ Gen/ Rectal- Not done, not indicated Extrem- cyanosis- none, clubbing, none, atrophy- none, strength- nl. Cane. Neuro- grossly intact to observation Assessment & Plan:

## 2015-04-03 NOTE — Patient Instructions (Signed)
Flu vax  Ok to continue CPAP 10/ [facility]  Order- office spirometry    Dx bronchitis without exacerbation  Order- lab- Celiac panel, Food IgE panel    Dx rash  We are stopping allergy vaccine now. Ok to take an antihistamine if needed

## 2015-04-03 NOTE — Assessment & Plan Note (Signed)
She reports good compliance and control with CPAP and is comfortable with pressure of 10

## 2015-04-03 NOTE — Assessment & Plan Note (Signed)
She did have a rash on her left low back which dermatologist told her was not shingles apparently thought was atopic dermatitis. She thinks peanuts and may be wheat products might bother her stomach at times. Not very solid history. Plan-food allergy and celiac panels.

## 2015-04-04 LAB — ALLERGEN FOOD PROFILE SPECIFIC IGE
Corn: <0.1 kU/L
Egg White IgE: <0.1 kU/L
Fish Cod: <0.1 kU/L
IGE (IMMUNOGLOBULIN E), SERUM: 321 kU/L — AB (ref <115)
MILK IGE: 1.23 kU/L — AB
SHRIMP IGE: 0.33 kU/L — AB
Soybean IgE: <0.1 kU/L
Tuna IgE: <0.1 kU/L
Wheat IgE: <0.1 kU/L

## 2015-04-04 LAB — GLIADIN ANTIBODIES, SERUM
GLIADIN IGG: 1 U (ref <20)
Gliadin IgA: 4 Units (ref <20)

## 2015-04-04 LAB — TISSUE TRANSGLUTAMINASE, IGA: Tissue Transglutaminase Ab, IgA: 1 U/mL (ref <4)

## 2015-04-06 ENCOUNTER — Telehealth: Payer: Self-pay | Admitting: Internal Medicine

## 2015-04-06 LAB — RETICULIN ANTIBODIES, IGA W TITER: RETICULIN AB, IGA: NEGATIVE

## 2015-04-06 NOTE — Telephone Encounter (Signed)
Pt returning call.Mindy Ingram ° °

## 2015-04-06 NOTE — Telephone Encounter (Signed)
Patient notified of results. Copy mailed to patient. Nothing further needed.

## 2015-04-06 NOTE — Telephone Encounter (Signed)
Notes Recorded by Deneise Lever, MD on 04/06/2015 at 2:11 PM Food allergy tests- There were elevated allergy antibody levels for milk/ dairy, and for shrimp. Avoid those if they seem to cause problems. Tests for wheat and Celiac/ gluten sensitivity were normal. ------------- lmtcb x1 for pt.

## 2015-04-09 NOTE — Progress Notes (Signed)
Quick Note:  Contacted pt regarding Allergy test results. Pt had already spoke with another nurse last week regarding these results. Results had been placed in the mail for pt. Pt now inquiring on results for allergy test from approx 10 years ago. Advised pt we will have to order paper chart and this could take approx 2 - 3 days to a week, and we will contact her back once we receive that with more information. Pt understood with no other concerns. Order for paper chart placed today 10/10. Will hold in [place] box to follow up on. ______

## 2015-04-10 ENCOUNTER — Other Ambulatory Visit: Payer: Self-pay

## 2015-04-10 DIAGNOSIS — Z1231 Encounter for screening mammogram for malignant neoplasm of breast: Secondary | ICD-10-CM

## 2015-04-23 ENCOUNTER — Telehealth: Payer: Self-pay | Admitting: Internal Medicine

## 2015-04-23 NOTE — Telephone Encounter (Signed)
lmtcb x1 for pt. 

## 2015-04-24 NOTE — Telephone Encounter (Signed)
lmtcb X2 for pt.  

## 2015-04-25 NOTE — Telephone Encounter (Signed)
Only suggest ahe avoid foods that really cause problems. If baked goods, dairy products don't bother her, then ok. The food allergy test just gives [place] advice about what to pay more attention to.

## 2015-04-25 NOTE — Telephone Encounter (Signed)
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

## 2015-04-25 NOTE — Telephone Encounter (Signed)
She is aware of CY's recommendations. Nothing further was needed.

## 2015-05-07 ENCOUNTER — Encounter: Payer: Self-pay | Admitting: Internal Medicine

## 2015-05-11 ENCOUNTER — Ambulatory Visit: Payer: PPO

## 2015-05-15 ENCOUNTER — Ambulatory Visit
Admission: RE | Admit: 2015-05-15 | Discharge: 2015-05-15 | Disposition: A | Discharge status: Home / Self Care | Payer: PPO | Source: Ambulatory Visit

## 2015-05-15 ENCOUNTER — Ambulatory Visit
Admission: RE | Admit: 2015-05-15 | Discharge: 2015-05-15 | Disposition: A | Discharge status: Home / Self Care | Payer: PPO | Source: Ambulatory Visit | Attending: Internal Medicine | Admitting: Internal Medicine

## 2015-05-15 ENCOUNTER — Other Ambulatory Visit: Payer: Self-pay | Admitting: Internal Medicine

## 2015-05-15 DIAGNOSIS — M25511 Pain in right shoulder: Secondary | ICD-10-CM

## 2015-05-15 DIAGNOSIS — Z1231 Encounter for screening mammogram for malignant neoplasm of breast: Secondary | ICD-10-CM

## 2015-07-24 DIAGNOSIS — E119 Type 2 diabetes mellitus without complications: Secondary | ICD-10-CM | POA: Diagnosis not present

## 2015-07-24 DIAGNOSIS — M2041 Other hammer toe(s) (acquired), right foot: Secondary | ICD-10-CM | POA: Diagnosis not present

## 2015-07-26 DIAGNOSIS — E785 Hyperlipidemia, unspecified: Secondary | ICD-10-CM | POA: Diagnosis not present

## 2015-07-26 DIAGNOSIS — I10 Essential (primary) hypertension: Secondary | ICD-10-CM | POA: Diagnosis not present

## 2015-07-26 DIAGNOSIS — E088 Diabetes mellitus due to underlying condition with unspecified complications: Secondary | ICD-10-CM | POA: Diagnosis not present

## 2015-09-24 ENCOUNTER — Encounter: Payer: Self-pay | Admitting: Internal Medicine

## 2015-09-24 ENCOUNTER — Ambulatory Visit (INDEPENDENT_AMBULATORY_CARE_PROVIDER_SITE_OTHER): Payer: PPO | Admitting: Internal Medicine

## 2015-09-24 ENCOUNTER — Telehealth: Payer: Self-pay | Admitting: Internal Medicine

## 2015-09-24 VITALS — BP 132/70 | HR 71 | Ht 59.5 in | Wt 155.0 lb

## 2015-09-24 DIAGNOSIS — R058 Other specified cough: Secondary | ICD-10-CM | POA: Insufficient documentation

## 2015-09-24 DIAGNOSIS — R05 Cough: Secondary | ICD-10-CM | POA: Diagnosis not present

## 2015-09-24 HISTORY — DX: Other specified cough: R05.8

## 2015-09-24 MED ORDER — TRAMADOL HCL 50 MG PO TABS: 75.0000 mg | ORAL_TABLET | Freq: Four times a day (QID) | ORAL | Status: DC | PRN

## 2015-09-24 MED ORDER — PREDNISONE 10 MG PO TABS: ORAL_TABLET | ORAL | Status: DC

## 2015-09-24 MED ORDER — OMEPRAZOLE 20 MG PO CPDR: 20.0000 mg | DELAYED_RELEASE_CAPSULE | Freq: Every day | ORAL | Status: DC

## 2015-09-24 MED ORDER — METHYLPREDNISOLONE ACETATE 80 MG/ML IJ SUSP
120.0000 mg | Freq: Once | INTRAMUSCULAR | Status: AC
  Administered 2015-09-24: 120 mg via INTRAMUSCULAR

## 2015-09-24 NOTE — Telephone Encounter (Signed)
Patient added on Wert's schedule today - no availability with CY or NP's.  Nothing further needed.

## 2015-09-24 NOTE — Patient Instructions (Signed)
Depomedrol 120 mg IM today and if not better then ok to take the prednisone = Prednisone 10 mg take  4 each am x 2 days,   2 each am x 2 days,  1 each am x 2 days and stop   Try prilosec otc 20mg   Take 30- 60 min before your first and last meals of the day until better for a week and then stop.  For cough > delsym 2 tsp every 12 hours as needed and supplement with tramadol   GERD (REFLUX)  is an extremely common cause of respiratory symptoms just like yours , many times with no obvious heartburn at all.    It can be treated with medication, but also with lifestyle changes including elevation of the head of your bed (ideally with 6 inch  bed blocks),  Smoking cessation, avoidance of late meals, excessive alcohol, and avoid fatty foods, chocolate, peppermint, colas, red wine, and acidic juices such as orange juice.  NO MINT OR MENTHOL PRODUCTS SO NO COUGH DROPS  USE SUGARLESS CANDY INSTEAD (Jolley ranchers or Stover's or Life Savers) or even ice chips will also do - the key is to swallow to prevent all throat clearing. NO OIL BASED VITAMINS - use powdered substitutes.  See Dr Annamaria Boots in 2 weeks if not better

## 2015-09-24 NOTE — Telephone Encounter (Signed)
Pt c/o sore throat and cough. Pt reports cough was very dry in the beginning but is slowly starting to produce mucus.  Pt states that she she painted her finger nails Saturday night and the fumes were strong. Pt states that the fumes were very strong on her hands even after drying.  Pt states that she is not sure if this is what caused her current symptoms or if it is the rising pollen count. Pt denies any current allergies.  Denies head congestion/chest congestion. Pt using Robitussin prn Please advise Dr Annamaria Boots. Pt requesting to be seen today.  Allergies  Allergen Reactions  . Hydrocodone-Acetaminophen     REACTION: sore throat with vicodin  . Oxycontin [Oxycodone Hcl]     "makes me crazy"  . Prochlorperazine Edisylate     REACTION: stroke-like symptoms with companzine  . Percodan [Oxycodone-Aspirin] Palpitations    Fast heartbeat with percodan     Medication List       This list is accurate as of: 09/24/15  9:49 AM.  Always use your most recent med list.               Azelastine-Fluticasone 137-50 MCG/ACT Susp  Place 1 spray into the nose daily as needed (congestion).     calcium-vitamin D 500-200 MG-UNIT tablet  Take 1 tablet by mouth daily.     COMPLETE ENERGY Tabs  Take 1 tablet by mouth daily.     cyclobenzaprine 10 MG tablet  Commonly known as:  FLEXERIL  Take 10 mg by mouth as needed for muscle spasms. As needed     dicyclomine 10 MG capsule  Commonly known as:  BENTYL  Take 10 mg by mouth 4 (four) times daily. Take 4 times a day as needed     fish oil-omega-3 fatty acids 1000 MG capsule  Take 2 g by mouth daily.     fluticasone 50 MCG/ACT nasal spray  Commonly known as:  FLONASE  Place 2 sprays into the nose daily as needed for allergies.     losartan-hydrochlorothiazide 50-12.5 MG tablet  Commonly known as:  HYZAAR  Take 1 tablet by mouth at bedtime.     metFORMIN 500 MG 24 hr tablet  Commonly known as:  GLUCOPHAGE-XR  Take 1,000 mg by mouth daily.     metoprolol 200 MG 24 hr tablet  Commonly known as:  TOPROL-XL  Take 200 mg by mouth daily.     NONFORMULARY OR COMPOUNDED ITEM  Allergy Vaccine 1:50 Given at Home     omeprazole 20 MG tablet  Commonly known as:  PRILOSEC OTC  Take 20 mg by mouth daily.     PRESCRIPTION MEDICATION  by Subconjunctival route once a week.     rosuvastatin 10 MG tablet  Commonly known as:  CRESTOR  Take 10 mg by mouth 2 (two) times a week.     traMADol 50 MG tablet  Commonly known as:  ULTRAM  Take 75 mg by mouth Every 4 hours as needed for severe pain. pain     triamcinolone 0.025 % ointment  Commonly known as:  KENALOG  Apply 1 application topically 2 (two) times daily.

## 2015-09-24 NOTE — Progress Notes (Signed)
Subjective:     Patient ID: Mindy Ingram, female   DOB: April 24, 1946,   MRN: [id number]  HPI   87 yobf never smoker followed by Dr Annamaria Boots for allergic rhinitis/osa  09/25/2015 acute extended ov/Abbegail Matuska re: coughing fits Chief Complaint  Patient presents with  . Acute Visit    Pt c/o cough x 2 days- prod but unsure of sputum color. She states that when she coughs her chest burns.    not on any maint rx for rhinitis(out of astelin)  and not taking ppi consistently then abruptly ill with harcking mostly dry cough worse day than noct Abrupt onset p inhaling finger nail polish or remover s antecedent nasal sympotms   No obvious day to day or daytime variability or assoc excess/ purulent sputum or mucus plugs   or   chest tightness, subjective wheeze or overt sinus or hb symptoms. No unusual exp hx or h/o childhood pna/ asthma or knowledge of premature birth.   Also denies any obvious fluctuation of symptoms with weather or environmental changes or other aggravating or alleviating factors except as outlined above   Current Medications, Allergies, Complete Past Medical History, Past Surgical History, Family History, and Social History were reviewed in [employer] record.  ROS  The following are not active complaints unless bolded sore throat, dysphagia, dental problems, itching, sneezing,  nasal congestion or excess/ purulent secretions, ear ache,   fever, chills, sweats, unintended wt loss, classically pleuritic or exertional cp, hemoptysis,  orthopnea pnd or leg swelling, presyncope, palpitations, abdominal pain, anorexia, nausea, vomiting, diarrhea  or change in bowel or bladder habits, change in stools or urine, dysuria,hematuria,  rash, arthralgias, visual complaints, headache, numbness, weakness or ataxia or problems with walking or coordination,  change in mood/affect or memory.            Review of Systems     Objective:   Physical Exam   Animated hoarse bf nad  with shrill upper airway cough   Wt Readings from Last 3 Encounters:  09/24/15 155 lb (70.308 kg)  04/03/15 153 lb 6.4 oz (69.582 kg)  03/30/14 152 lb (68.947 kg)    Vital signs reviewed  HEENT: nl dentition, turbinates, and oropharynx. Nl external ear canals without cough reflex   NECK :  without JVD/Nodes/TM/ nl carotid upstrokes bilaterally   LUNGS: no acc muscle use,  Nl contour chest with classic insp>> exp wheeze with cough early on inspt   CV:  RRR  no s3 or murmur or increase in P2, no edema   ABD:  soft and nontender with nl inspiratory excursion in the supine position. No bruits or organomegaly, bowel sounds nl  MS:  Nl gait/ ext warm without deformities, calf tenderness, cyanosis or clubbing No obvious joint restrictions   SKIN: warm and dry without lesions    NEURO:  alert, approp, nl sensorium with  no motor deficits        Assessment:

## 2015-09-24 NOTE — Telephone Encounter (Signed)
Error

## 2015-09-24 NOTE — Telephone Encounter (Signed)
Per CY-sorry no openings today and suggests patient be seen by NP. Thanks.

## 2015-09-24 NOTE — Telephone Encounter (Signed)
Phone note closed in error. 

## 2015-09-25 ENCOUNTER — Encounter: Payer: Self-pay | Admitting: Internal Medicine

## 2015-09-25 NOTE — Assessment & Plan Note (Signed)
?   Triggered by allergic rhinitis or sinus dz or viral or non-specific irritants (fingernail polish remover in her case) but there is no evidence of asthma here and all the wheezing she is doing is upper airway in nature.   Classic Upper airway cough syndrome, so named because it's frequently impossible to sort out how much is  CR/sinusitis with freq throat clearing (which can be related to primary GERD)   vs  causing  secondary (" extra esophageal")  GERD from wide swings in gastric pressure that occur with throat clearing, often  promoting self use of mint and menthol lozenges that reduce the lower esophageal sphincter tone and exacerbate the problem further in a cyclical fashion.   These are the same pts (now being labeled as having "irritable larynx syndrome" by some cough centers) who not infrequently have a history of having failed to tolerate ace inhibitors,  dry powder inhalers or biphosphonates or report having atypical reflux symptoms that don't respond to standard doses of PPI , and are easily confused as having aecopd or asthma flares by even experienced allergists/ pulmonologists.   Explained the natural history of cyclical cough and why it's necessary in patients at risk to treat GERD aggressively - at least  short term -   to reduce risk of evolving cyclical cough initially  triggered by epithelial injury and a heightened sensitivty to the effects of any upper airway irritants,  most importantly acid - related - then perpetuated by epithelial injury related to the cough itself as the upper airway collapses on itself.  That is, the more sensitive the epithelium becomes once it is damaged by the virus, the more the ensuing irritability> the more the cough, the more the secondary reflux (especially in those prone to reflux) the more the irritation of the sensitive mucosa and so on in a  Classic cyclical pattern.    I had an extended discussion with the patient reviewing all relevant studies  completed to date and  lasting 25 minutes of a 40  Minute acute office  visit    Each maintenance medication was reviewed in detail including most importantly the difference between maintenance and prns and under what circumstances the prns are to be triggered using an action plan format that is not reflected in the computer generated alphabetically organized AVS.    Please see instructions for details which were reviewed in writing and the patient given a copy highlighting the part that I personally wrote and discussed at today's ov.

## 2015-09-27 ENCOUNTER — Other Ambulatory Visit: Payer: Self-pay | Admitting: Internal Medicine

## 2015-09-27 MED ORDER — OMEPRAZOLE 20 MG PO CPDR: DELAYED_RELEASE_CAPSULE | ORAL | Status: DC

## 2015-10-26 DIAGNOSIS — M797 Fibromyalgia: Secondary | ICD-10-CM | POA: Diagnosis not present

## 2015-10-26 DIAGNOSIS — J45909 Unspecified asthma, uncomplicated: Secondary | ICD-10-CM | POA: Diagnosis not present

## 2015-10-26 DIAGNOSIS — K219 Gastro-esophageal reflux disease without esophagitis: Secondary | ICD-10-CM | POA: Diagnosis not present

## 2015-10-26 DIAGNOSIS — G4733 Obstructive sleep apnea (adult) (pediatric): Secondary | ICD-10-CM | POA: Diagnosis not present

## 2015-10-26 DIAGNOSIS — E559 Vitamin D deficiency, unspecified: Secondary | ICD-10-CM | POA: Diagnosis not present

## 2015-10-26 DIAGNOSIS — Z1389 Encounter for screening for other disorder: Secondary | ICD-10-CM | POA: Diagnosis not present

## 2015-10-26 DIAGNOSIS — E785 Hyperlipidemia, unspecified: Secondary | ICD-10-CM | POA: Diagnosis not present

## 2015-10-26 DIAGNOSIS — E119 Type 2 diabetes mellitus without complications: Secondary | ICD-10-CM | POA: Diagnosis not present

## 2015-10-26 DIAGNOSIS — Z79899 Other long term (current) drug therapy: Secondary | ICD-10-CM | POA: Diagnosis not present

## 2015-10-26 DIAGNOSIS — I10 Essential (primary) hypertension: Secondary | ICD-10-CM | POA: Diagnosis not present

## 2015-10-26 DIAGNOSIS — Z7984 Long term (current) use of oral hypoglycemic drugs: Secondary | ICD-10-CM | POA: Diagnosis not present

## 2015-10-26 DIAGNOSIS — Z0000 Encounter for general adult medical examination without abnormal findings: Secondary | ICD-10-CM | POA: Diagnosis not present

## 2015-10-26 DIAGNOSIS — Z0001 Encounter for general adult medical examination with abnormal findings: Secondary | ICD-10-CM | POA: Diagnosis not present

## 2015-10-26 DIAGNOSIS — M549 Dorsalgia, unspecified: Secondary | ICD-10-CM | POA: Diagnosis not present

## 2015-11-12 DIAGNOSIS — G4733 Obstructive sleep apnea (adult) (pediatric): Secondary | ICD-10-CM | POA: Diagnosis not present

## 2015-11-13 DIAGNOSIS — G4733 Obstructive sleep apnea (adult) (pediatric): Secondary | ICD-10-CM | POA: Diagnosis not present

## 2015-11-27 DIAGNOSIS — N952 Postmenopausal atrophic vaginitis: Secondary | ICD-10-CM | POA: Diagnosis not present

## 2015-11-27 DIAGNOSIS — N951 Menopausal and female climacteric states: Secondary | ICD-10-CM | POA: Diagnosis not present

## 2015-11-27 DIAGNOSIS — Z01411 Encounter for gynecological examination (general) (routine) with abnormal findings: Secondary | ICD-10-CM | POA: Diagnosis not present

## 2016-01-15 DIAGNOSIS — Z780 Asymptomatic menopausal state: Secondary | ICD-10-CM | POA: Diagnosis not present

## 2016-01-15 DIAGNOSIS — N952 Postmenopausal atrophic vaginitis: Secondary | ICD-10-CM | POA: Diagnosis not present

## 2016-02-25 DIAGNOSIS — Z6831 Body mass index (BMI) 31.0-31.9, adult: Secondary | ICD-10-CM | POA: Diagnosis not present

## 2016-02-25 DIAGNOSIS — E785 Hyperlipidemia, unspecified: Secondary | ICD-10-CM | POA: Diagnosis not present

## 2016-04-02 ENCOUNTER — Ambulatory Visit (INDEPENDENT_AMBULATORY_CARE_PROVIDER_SITE_OTHER): Payer: PPO | Admitting: Internal Medicine

## 2016-04-02 ENCOUNTER — Ambulatory Visit: Payer: PPO | Admitting: Internal Medicine

## 2016-04-02 ENCOUNTER — Encounter: Payer: Self-pay | Admitting: Internal Medicine

## 2016-04-02 VITALS — BP 128/70 | HR 70 | Ht 59.0 in | Wt 157.2 lb

## 2016-04-02 DIAGNOSIS — J4521 Mild intermittent asthma with (acute) exacerbation: Secondary | ICD-10-CM

## 2016-04-02 DIAGNOSIS — J3089 Other allergic rhinitis: Secondary | ICD-10-CM

## 2016-04-02 DIAGNOSIS — J302 Other seasonal allergic rhinitis: Secondary | ICD-10-CM

## 2016-04-02 DIAGNOSIS — Z23 Encounter for immunization: Secondary | ICD-10-CM

## 2016-04-02 DIAGNOSIS — R05 Cough: Secondary | ICD-10-CM

## 2016-04-02 DIAGNOSIS — R21 Rash and other nonspecific skin eruption: Secondary | ICD-10-CM | POA: Diagnosis not present

## 2016-04-02 DIAGNOSIS — G4733 Obstructive sleep apnea (adult) (pediatric): Secondary | ICD-10-CM

## 2016-04-02 DIAGNOSIS — R058 Other specified cough: Secondary | ICD-10-CM

## 2016-04-02 MED ORDER — LEVALBUTEROL HCL 0.63 MG/3ML IN NEBU
0.6300 mg | INHALATION_SOLUTION | Freq: Once | RESPIRATORY_TRACT | Status: AC
  Administered 2016-04-02: 0.63 mg via RESPIRATORY_TRACT

## 2016-04-02 MED ORDER — METHYLPREDNISOLONE ACETATE 80 MG/ML IJ SUSP
80.0000 mg | Freq: Once | INTRAMUSCULAR | Status: AC
  Administered 2016-04-02: 80 mg via INTRAMUSCULAR

## 2016-04-02 MED ORDER — AZELASTINE-FLUTICASONE 137-50 MCG/ACT NA SUSP: NASAL | 12 refills | Status: DC

## 2016-04-02 MED ORDER — FLUTICASONE-SALMETEROL 100-50 MCG/DOSE IN AEPB: 1.0000 | INHALATION_SPRAY | Freq: Every day | RESPIRATORY_TRACT | 0 refills | Status: DC

## 2016-04-02 NOTE — Progress Notes (Signed)
Subjective:    Patient ID: Mindy Ingram, female    DOB: 03/04/1946, 70 y.o.   MRN: [id number]  HPI F never smoker followed for obstructive sleep apnea, allergic rhinitis complicated by DM, bronchitis, GERD. .  04/02/15- 70 year old female never smoker followed for obstructive sleep apnea, allergic rhinitis/ quit vaccine, complicated by DM, bronchitis, GERD. CPAP 10/[facility]. Pressure is comfortable Notices occasionally raspy breathing without wheeze or significant cough. Does not have inhaler. She had a rash on her low back which dermatology set was not shingles. She thinks they called an atopic dermatitis. She asks about potential allergy to peanuts and wheat. Discussed available testing. Office Spirometry 04/03/2015- WNL FVC 2.14/110%, FEV1 1.94/128%, FEV1/FVC 0.91   04/02/2016-70 year old female never smoker followed for OSA, allergic rhinitis, complicated by DM, bronchitis, GERD CPAP 10[facility] FOLLOWS FOR: DME: [facility]. DL attached. Pt wears CPAP nightly for about 6 hours. Pressure works well for patient and needs order for new supplies  Download shows good compliance 94%/4 hours, good control AHI 3.1/hour she has been comfortable with CPAP and sleeping better with it. Concerned about bumps on skin of face over last 6 weeks-roughly in the area of her fullface mask. No itching or burning. She has seen a dermatologist. No biopsy. Increased dry cough without wheeze, nasal stuffiness. Blames fall season.  Review of Systems- see HPI Constitutional:   No-   weight loss, night sweats, fevers, chills, fatigue, lassitude. HEENT:   No-  headaches, difficulty swallowing, tooth/dental problems, sore throat,       No-  sneezing, itching, ear ache, +nasal congestion,+post nasal drip,  CV:  No-   chest pain, orthopnea, PND, swelling in lower extremities, anasarca, dizziness, palpitations Resp: No-   shortness of breath with exertion or at rest.              No-   productive cough,  No  non-productive cough,  No-  coughing up of blood.              No-   change in color of mucus.  No- wheezing.   Skin: +HPI GI:  No-   heartburn, indigestion, abdominal pain, nausea, vomiting,  GU:  MS:  No-   joint pain or swelling.   Neuro- nothing unusual  Psych:  No- change in mood or affect. No depression or anxiety.  No memory loss.    Objective:   Physical Exam General- Alert, Oriented, Affect-appropriate, Distress- none acute Skin- +Raised nodules right nasolabial fold without erythema or excoriation Lymphadenopathy- none Head- atraumatic            Eyes- Gross vision intact, PERRLA, conjunctivae clear secretions            Ears- +Occlusive cerumen            Nose- +turbinate edema, No-Septal dev, mucus+, No-polyps, erosion, perforation             Throat- Mallampati II , mucosa clear , drainage- none, tonsils- atrophic Neck- flexible , trachea midline, no stridor , thyroid nl, carotid no bruit Chest - symmetrical excursion , unlabored           Heart/CV- RRR , no murmur , no gallop  , no rub, nl s1 s2                           - JVD- none , edema- none, stasis changes- none, varices- none  Lung- clear to P&A, wheeze + minimal unlabored, cough-+slight , dullness-none, rub- none           Chest wall-  Abd- Br/ Gen/ Rectal- Not done, not indicated Extrem- cyanosis- none, clubbing, none, atrophy- none, strength- nl. Cane. Neuro- grossly intact to observation Assessment & Plan:

## 2016-04-02 NOTE — Patient Instructions (Addendum)
Order- [facility]   Please help change CPAP mask of choice, continue CPAP 10, humidifier, supplies, AirView   Dx  OSA  Suggest you get a CPAP mask that touches your face differently   Depo 80    Dx rash, allergic rhinitis  Flu vax  Sample Advair 100     Inhale 1 puff then rinse mouth, twice daily  See if this helps your chest

## 2016-04-03 NOTE — Assessment & Plan Note (Addendum)
Good compliance and control. She is going try different face mask styles to see if that helps with the bumps on her face.

## 2016-04-03 NOTE — Assessment & Plan Note (Signed)
Minimal wheezing noted. Plan-Depo-Medrol today to help both bronchospasm and skin rash. Sample Advair 100 with education for trial

## 2016-04-03 NOTE — Assessment & Plan Note (Signed)
Turbinate edema. We discussed management options. Plan-refill Dymista nasal spray

## 2016-04-03 NOTE — Assessment & Plan Note (Signed)
Intradermal nodules right nasolabial fold and maybe a little bit on her chin. No evident inflammation. This is not a common finding from CPAP mask pressure but we will have her try changing mask style anyway. She will follow-up with dermatology.

## 2016-04-16 ENCOUNTER — Encounter: Payer: Self-pay | Admitting: Internal Medicine

## 2016-04-29 ENCOUNTER — Other Ambulatory Visit: Payer: Self-pay | Admitting: Internal Medicine

## 2016-04-29 ENCOUNTER — Ambulatory Visit
Admission: RE | Admit: 2016-04-29 | Discharge: 2016-04-29 | Disposition: A | Discharge status: Home / Self Care | Payer: PPO | Source: Ambulatory Visit | Attending: Internal Medicine | Admitting: Internal Medicine

## 2016-04-29 DIAGNOSIS — E559 Vitamin D deficiency, unspecified: Secondary | ICD-10-CM | POA: Diagnosis not present

## 2016-04-29 DIAGNOSIS — J45909 Unspecified asthma, uncomplicated: Secondary | ICD-10-CM | POA: Diagnosis not present

## 2016-04-29 DIAGNOSIS — Z1231 Encounter for screening mammogram for malignant neoplasm of breast: Secondary | ICD-10-CM

## 2016-04-29 DIAGNOSIS — G4762 Sleep related leg cramps: Secondary | ICD-10-CM | POA: Diagnosis not present

## 2016-04-29 DIAGNOSIS — E785 Hyperlipidemia, unspecified: Secondary | ICD-10-CM | POA: Diagnosis not present

## 2016-04-29 DIAGNOSIS — G4730 Sleep apnea, unspecified: Secondary | ICD-10-CM | POA: Diagnosis not present

## 2016-04-29 DIAGNOSIS — Z7984 Long term (current) use of oral hypoglycemic drugs: Secondary | ICD-10-CM | POA: Diagnosis not present

## 2016-04-29 DIAGNOSIS — N952 Postmenopausal atrophic vaginitis: Secondary | ICD-10-CM | POA: Diagnosis not present

## 2016-04-29 DIAGNOSIS — L659 Nonscarring hair loss, unspecified: Secondary | ICD-10-CM | POA: Diagnosis not present

## 2016-04-29 DIAGNOSIS — I10 Essential (primary) hypertension: Secondary | ICD-10-CM | POA: Diagnosis not present

## 2016-04-29 DIAGNOSIS — E119 Type 2 diabetes mellitus without complications: Secondary | ICD-10-CM | POA: Diagnosis not present

## 2016-04-29 DIAGNOSIS — J309 Allergic rhinitis, unspecified: Secondary | ICD-10-CM | POA: Diagnosis not present

## 2016-05-01 DIAGNOSIS — G4733 Obstructive sleep apnea (adult) (pediatric): Secondary | ICD-10-CM | POA: Diagnosis not present

## 2016-06-05 DIAGNOSIS — H43811 Vitreous degeneration, right eye: Secondary | ICD-10-CM | POA: Diagnosis not present

## 2016-06-05 DIAGNOSIS — H2511 Age-related nuclear cataract, right eye: Secondary | ICD-10-CM | POA: Diagnosis not present

## 2016-06-05 DIAGNOSIS — H2512 Age-related nuclear cataract, left eye: Secondary | ICD-10-CM | POA: Diagnosis not present

## 2016-06-05 DIAGNOSIS — H25012 Cortical age-related cataract, left eye: Secondary | ICD-10-CM | POA: Diagnosis not present

## 2016-08-08 DIAGNOSIS — E119 Type 2 diabetes mellitus without complications: Secondary | ICD-10-CM | POA: Diagnosis not present

## 2016-08-08 DIAGNOSIS — H04123 Dry eye syndrome of bilateral lacrimal glands: Secondary | ICD-10-CM | POA: Diagnosis not present

## 2016-08-08 DIAGNOSIS — H25812 Combined forms of age-related cataract, left eye: Secondary | ICD-10-CM | POA: Diagnosis not present

## 2016-08-12 ENCOUNTER — Telehealth: Payer: Self-pay | Admitting: Internal Medicine

## 2016-08-12 DIAGNOSIS — G4733 Obstructive sleep apnea (adult) (pediatric): Secondary | ICD-10-CM

## 2016-08-12 NOTE — Telephone Encounter (Signed)
Spoke with pt. She is needing an order sent to [facility] for a new CPAP machine. Pt is eligible for a new machine. Order has been placed. Nothing further was needed.

## 2016-09-01 DIAGNOSIS — G4733 Obstructive sleep apnea (adult) (pediatric): Secondary | ICD-10-CM | POA: Diagnosis not present

## 2016-09-18 DIAGNOSIS — M6701 Short Achilles tendon (acquired), right ankle: Secondary | ICD-10-CM | POA: Diagnosis not present

## 2016-09-18 DIAGNOSIS — M6702 Short Achilles tendon (acquired), left ankle: Secondary | ICD-10-CM | POA: Diagnosis not present

## 2016-09-18 DIAGNOSIS — M7742 Metatarsalgia, left foot: Secondary | ICD-10-CM | POA: Diagnosis not present

## 2016-09-18 DIAGNOSIS — L989 Disorder of the skin and subcutaneous tissue, unspecified: Secondary | ICD-10-CM

## 2016-09-18 DIAGNOSIS — M7741 Metatarsalgia, right foot: Secondary | ICD-10-CM

## 2016-09-18 DIAGNOSIS — M6240 Contracture of muscle, unspecified site: Secondary | ICD-10-CM | POA: Insufficient documentation

## 2016-09-18 HISTORY — DX: Metatarsalgia, right foot: M77.41

## 2016-09-18 HISTORY — DX: Disorder of the skin and subcutaneous tissue, unspecified: L98.9

## 2016-10-08 DIAGNOSIS — R3915 Urgency of urination: Secondary | ICD-10-CM | POA: Diagnosis not present

## 2016-10-08 DIAGNOSIS — R350 Frequency of micturition: Secondary | ICD-10-CM | POA: Diagnosis not present

## 2016-10-14 ENCOUNTER — Ambulatory Visit: Payer: Self-pay | Admitting: Allergy

## 2016-10-17 ENCOUNTER — Telehealth: Payer: Self-pay | Admitting: Internal Medicine

## 2016-10-17 DIAGNOSIS — G4733 Obstructive sleep apnea (adult) (pediatric): Secondary | ICD-10-CM

## 2016-10-17 NOTE — Telephone Encounter (Signed)
Called and spoke with pt and she stated that she will need to have an order sent over to [facility] [place] [facility] [facility] in [place] for a new cpap machine.  She stated that the humidifier keeps failing and she stated that she has to restart the machine over and over to get it to work.  They advised her that she has had her machine for over 5 years so she will qualify for a new machine.  CY please advise. Thanks  Last ov--04/02/2016 Next ov--no pending appt.   Allergies  Allergen Reactions  . Hydrocodone-Acetaminophen     REACTION: sore throat with vicodin  . Oxycontin [Oxycodone Hcl]     "makes me crazy"  . Prochlorperazine Edisylate     REACTION: stroke-like symptoms with companzine  . Percodan [Oxycodone-Aspirin] Palpitations    Fast heartbeat with percodan

## 2016-10-17 NOTE — Telephone Encounter (Signed)
Order has been placed for the pt to get the  new cpap via [facility].  Pt is aware.

## 2016-10-17 NOTE — Telephone Encounter (Signed)
We had her listed as DME company [facility]. Ok to change to [facility] if that is what she wishes. There has been no break in therapy.  Order- replace old CPAP machine  Auto 5-15, mask of choice, humidifier, supplies, AirView   Dx OSA

## 2016-10-20 DIAGNOSIS — H25812 Combined forms of age-related cataract, left eye: Secondary | ICD-10-CM | POA: Diagnosis not present

## 2016-10-20 DIAGNOSIS — H2512 Age-related nuclear cataract, left eye: Secondary | ICD-10-CM | POA: Diagnosis not present

## 2016-10-28 DIAGNOSIS — N952 Postmenopausal atrophic vaginitis: Secondary | ICD-10-CM | POA: Diagnosis not present

## 2016-10-28 DIAGNOSIS — Z1389 Encounter for screening for other disorder: Secondary | ICD-10-CM | POA: Diagnosis not present

## 2016-10-28 DIAGNOSIS — J309 Allergic rhinitis, unspecified: Secondary | ICD-10-CM | POA: Diagnosis not present

## 2016-10-28 DIAGNOSIS — Z23 Encounter for immunization: Secondary | ICD-10-CM | POA: Diagnosis not present

## 2016-10-28 DIAGNOSIS — I10 Essential (primary) hypertension: Secondary | ICD-10-CM | POA: Diagnosis not present

## 2016-10-28 DIAGNOSIS — E119 Type 2 diabetes mellitus without complications: Secondary | ICD-10-CM | POA: Diagnosis not present

## 2016-10-28 DIAGNOSIS — Z0001 Encounter for general adult medical examination with abnormal findings: Secondary | ICD-10-CM | POA: Diagnosis not present

## 2016-10-28 DIAGNOSIS — E559 Vitamin D deficiency, unspecified: Secondary | ICD-10-CM | POA: Diagnosis not present

## 2016-10-28 DIAGNOSIS — E785 Hyperlipidemia, unspecified: Secondary | ICD-10-CM | POA: Diagnosis not present

## 2016-10-28 DIAGNOSIS — J45909 Unspecified asthma, uncomplicated: Secondary | ICD-10-CM | POA: Diagnosis not present

## 2016-10-28 DIAGNOSIS — G4762 Sleep related leg cramps: Secondary | ICD-10-CM | POA: Diagnosis not present

## 2016-10-28 DIAGNOSIS — Z79899 Other long term (current) drug therapy: Secondary | ICD-10-CM | POA: Diagnosis not present

## 2016-10-28 DIAGNOSIS — G4730 Sleep apnea, unspecified: Secondary | ICD-10-CM | POA: Diagnosis not present

## 2016-10-28 DIAGNOSIS — L659 Nonscarring hair loss, unspecified: Secondary | ICD-10-CM | POA: Diagnosis not present

## 2016-10-30 DIAGNOSIS — G8929 Other chronic pain: Secondary | ICD-10-CM | POA: Diagnosis not present

## 2016-10-30 DIAGNOSIS — M25561 Pain in right knee: Secondary | ICD-10-CM | POA: Diagnosis not present

## 2016-11-03 ENCOUNTER — Telehealth: Payer: Self-pay | Admitting: Allergy

## 2016-11-03 NOTE — Telephone Encounter (Signed)
Dr. Nelva Bush, Mindy Ingram has been seeing Dr. Tarri Fuller D. Young.  He is retiring and she was wanting to establish care with you.  Analiza just wanted me to send you a message and ask that you look at her notes from Dr. Annamaria Boots because she did not feel she needed to be allergy tested again.  She is still coming for her appointment on 5/8 but she was hoping to bypass the testing.

## 2016-11-04 ENCOUNTER — Encounter: Payer: Self-pay | Admitting: Allergy

## 2016-11-04 ENCOUNTER — Ambulatory Visit (INDEPENDENT_AMBULATORY_CARE_PROVIDER_SITE_OTHER): Payer: PPO | Admitting: Allergy

## 2016-11-04 VITALS — BP 136/80 | HR 62 | Temp 98.5°F | Resp 18 | Ht 58.15 in | Wt 153.0 lb

## 2016-11-04 DIAGNOSIS — Z91018 Allergy to other foods: Secondary | ICD-10-CM | POA: Diagnosis not present

## 2016-11-04 DIAGNOSIS — J3089 Other allergic rhinitis: Secondary | ICD-10-CM

## 2016-11-04 DIAGNOSIS — R058 Other specified cough: Secondary | ICD-10-CM

## 2016-11-04 DIAGNOSIS — J4520 Mild intermittent asthma, uncomplicated: Secondary | ICD-10-CM

## 2016-11-04 DIAGNOSIS — R05 Cough: Secondary | ICD-10-CM | POA: Diagnosis not present

## 2016-11-04 DIAGNOSIS — J302 Other seasonal allergic rhinitis: Secondary | ICD-10-CM | POA: Diagnosis not present

## 2016-11-04 MED ORDER — ALBUTEROL SULFATE HFA 108 (90 BASE) MCG/ACT IN AERS: INHALATION_SPRAY | RESPIRATORY_TRACT | 1 refills | Status: DC

## 2016-11-04 MED ORDER — EPINEPHRINE 0.3 MG/0.3ML IJ SOAJ: 0.3000 mg | Freq: Once | INTRAMUSCULAR | 1 refills | Status: AC

## 2016-11-04 NOTE — Patient Instructions (Addendum)
Allergies     - continue as needed use of long-actin antihistamine like Zyrtec 10mg , Allegra 180mg  or Xyzal 5mg  daily (all are over-the-counter     - continue use of nasal spray as needed for nasal congestion/drainage like Fluticasone, Nasacort 2 sprays each nostril    - if symptoms persist despite above regimen and you would like to resume allergy shots let us know and we will need to perform skin testing to environmental allergens prior to going back on allergy shots.    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     - recommend a in-office shrimp challenge to determine if you are truly allergy to shrimp or if you can re-introduce in diet.      - have access to Epipen 0.3mg  for as needed use in case of allergic reaction and follow emergency action plan provided today.    Asthma   - at this time appears well controlled   - have access to albuterol inhaler 2 puffs every 4-6 hours as needed for cough, wheeze, difficulty breathing or chest tightness.  Monitor frequency of use.    Follow-up 6 months or sooner if needed (or for shrimp challenge)

## 2016-11-04 NOTE — Progress Notes (Signed)
New Patient Note  RE: Mindy Ingram MRN: [id number] DOB: 1945-07-11 Date of Office Visit: 11/04/2016  Referring provider: Josetta Huddle, MD Primary care provider: Josetta Huddle, MD  Chief Complaint: Environmental and food allergies  History of present illness: Mindy Ingram is a 71 y.o. female presenting today for consultation for allergy management. She would like to establish care for her allergies as her previous doctor (Dr. Annamaria Boots) is no longer providing this service.  She reports she was seeing Dr. Annamaria Boots since the 80s.  She was on allergen immunotherapy for a while and she stopped this about 2 to 2-1/2 years ago.  She reports she did really well without immunotherapy and could tell that her symptoms were much improved on this therapy.  She does recall having to have to vials and believe she was allergic to pollens, mold, dust.  She reports her symptoms are sneezing, cough and nasal congestion but symptoms are not that bothersome to her. She reports she does not take an antihistamine.  She will use a nasal spray (fluticasone) about 4 times a week which helps with her congestion.  At this time she is not interested in resuming allergen immunotherapy.  She reports she avoids dairy.  She reports upset stomach and diarrhea pretty immediately after straight dairy ingestion.  She is able to eat baked dairy products without any issue.   She does have a history of a rash back in 2016 that per EMR was noted to be atopic dermatitis.  She did see a dermatologist for this.  She did have serum IgE testing to various foods due to the rash that came back positive to dairy and shrimp. Thus she cut out shellfish from her diet. She does note the rash seemed to improve when she cut fish/shellfish from her diet.   She reports she would eat shrimp all the time without any issues.  She no longer has an up-to-date EpiPen.  She has a history of asthma.  She was told by Dr. Annamaria Boots at past visits that her  asthma was under good control.  She did use advair in the past but reports she has not been on a controller medication in the past year.  She does not have an albuterol inhaler either.  She denies any significant daytime or nighttime symptoms. She denies any oral steroid needs however she has received Depo-Medrol injections in the past with most recent being on October 2017. 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  She has sleep apnea and is on a CPAP nightly which she will continue to follow Dr. Annamaria Boots for management.  She does have occasional issue with reflux however she does not take any medication for this.    Review of systems: Review of Systems  Constitutional: Negative for chills and malaise/fatigue.  HENT: Positive for congestion. Negative for ear discharge, ear pain, nosebleeds, sinus pain, sore throat and tinnitus.   Eyes: Negative for discharge and redness.  Respiratory: Negative for cough, shortness of breath and wheezing.   Cardiovascular: Negative for chest pain.  Gastrointestinal: Negative for abdominal pain, diarrhea, heartburn, nausea and vomiting.  Musculoskeletal: Negative for joint pain and myalgias.  Skin: Negative for itching and rash.  Neurological: Negative for headaches.    All other systems negative unless noted above in HPI  Past medical history: Past Medical History:  Diagnosis Date  . Allergic rhinitis   .  Asthma   . Bronchitis    none in years  . DJD (degenerative joint disease)   . Fibromyalgia   . GERD (gastroesophageal reflux disease)   . Hypertension   . OSA (obstructive sleep apnea)    cpap setting of 10  . Otitis media    both ears with impacted cerumem  . Rapid heartbeat    no metoprolol lin pm  . Sleep apnea   . Type II or unspecified type diabetes mellitus without mention of complication, not stated as uncontrolled     Past surgical history: Past Surgical  History:  Procedure Laterality Date  . ABDOMINAL HYSTERECTOMY    . APPENDECTOMY    . BACK SURGERY  2013   lower back with plates and screws  . benign lump right axilla    . [place]   at [facility], slight limitation with turning neck  . CHOLECYSTECTOMY    . EUS N/A 07/21/2013   Procedure: UPPER ENDOSCOPIC ULTRASOUND (EUS) LINEAR;  Surgeon: Milus Banister, MD;  Location: [facility];  Service: Endoscopy;  Laterality: N/A;  . NEUROPLASTY / TRANSPOSITION MEDIAN NERVE AT [place]  . PARTIAL COLECTOMY  1990   benign adhesions  . right foot surgery  2000   right great toe with artificial bone inserted  . right knee arthroscopy    . [facility] and [facility]so      Family history:  Family History  Problem Relation Age of Onset  . Allergies Neg Hx   . Asthma Neg Hx   . Eczema Neg Hx   . Immunodeficiency Neg Hx     Social history: She lives in a home without carpeting with gas heating and central cooling. There are no pets in the home. There is no concern for water damage, mildew or roaches in the home. She is retired. She is a widow.    Medication List: Allergies as of 11/04/2016      Reactions   Hydrocodone-acetaminophen    REACTION: sore throat with vicodin   Oxycontin [oxycodone Hcl]    "makes me crazy"   Prochlorperazine Edisylate    REACTION: stroke-like symptoms with companzine   Percodan [oxycodone-aspirin] Palpitations   Fast heartbeat with percodan      Medication List       Accurate as of 11/04/16 11:52 AM. Always use your most recent med list.          albuterol 108 (90 Base) MCG/ACT inhaler Commonly known as:  PROVENTIL HFA;VENTOLIN HFA Inhale 2 puffs every 4-6 hours as needed   aspirin 325 MG tablet Take 325 mg by mouth daily.   COMPLETE ENERGY Tabs Take 1 tablet by mouth daily.   cyclobenzaprine 10 MG tablet Commonly known as:  FLEXERIL Take 10 mg by mouth as needed for muscle spasms. As needed   EPINEPHrine 0.3 mg/0.3 mL  Soaj injection Commonly known as:  EPI-PEN Inject 0.3 mLs (0.3 mg total) into the muscle once.   fluticasone 50 MCG/ACT nasal spray Commonly known as:  FLONASE Place 2 sprays into the nose daily as needed for allergies.   metFORMIN 500 MG 24 hr tablet Commonly known as:  GLUCOPHAGE-XR Take 1,000 mg by mouth daily.   metoprolol 200 MG 24 hr tablet Commonly known as:  TOPROL-XL Take 200 mg by mouth daily.   nitroGLYCERIN 0.4 MG SL tablet Commonly known as:  NITROSTAT Place 0.4 mg under the tongue every 5 (five) minutes as needed for chest pain.  rosuvastatin 10 MG tablet Commonly known as:  CRESTOR Take 10 mg by mouth 2 (two) times a week.   traMADol 50 MG tablet Commonly known as:  ULTRAM Take 1.5 tablets (75 mg total) by mouth every 6 (six) hours as needed for severe pain (or cough). pain   triamcinolone 0.025 % ointment Commonly known as:  KENALOG Apply 1 application topically 2 (two) times daily.   valsartan-hydrochlorothiazide 80-12.5 MG tablet Commonly known as:  DIOVAN-HCT Take 1 tablet by mouth daily.   VITAMIN D PO Take 1 tablet by mouth daily.       Known medication allergies: Allergies  Allergen Reactions  . Hydrocodone-Acetaminophen     REACTION: sore throat with vicodin  . Oxycontin [Oxycodone Hcl]     "makes me crazy"  . Prochlorperazine Edisylate     REACTION: stroke-like symptoms with companzine  . Percodan [Oxycodone-Aspirin] Palpitations    Fast heartbeat with percodan     Physical examination: Blood pressure 136/80, pulse 62, temperature 98.5 F (36.9 C), temperature source Oral, resp. rate 18, height 4' 10.15" (1.477 m), weight 153 lb (69.4 kg).  General: Alert, interactive, in no acute distress. HEENT: TMs pearly gray, turbinates minimally edematous without discharge, post-pharynx non erythematous. Neck: Supple without lymphadenopathy. Lungs: Clear to auscultation without wheezing, rhonchi or rales. {no increased work of  breathing. CV: Normal S1, S2 without murmurs. Abdomen: Nondistended, nontender. Skin: Warm and dry, without lesions or rashes. Extremities:  No clubbing, cyanosis or edema. Neuro:   Grossly intact.  Diagnositics/Labs: Labs:  Component     Latest Ref Rng & Units 04/03/2015  Egg White IgE     kU/L <0.10  Milk IgE     kU/L 1.23 (H)  Fish Cod     kU/L <0.10  Wheat IgE     kU/L <0.10  Peanut IgE     kU/L <0.10  Soybean IgE     kU/L <0.10  Corn     kU/L <0.10  Tomato IgE     kU/L <0.10  Orange     kU/L <0.10  Apple     kU/L <0.10  Chicken IgE     kU/L <0.10  Shrimp IgE     kU/L 0.33 (H)  Tuna IgE     kU/L <0.10  IgE (Immunoglobulin E), Serum     <115 kU/L 321 (H)   Component     Latest Ref Rng & Units 04/03/2015  Deamidated Gliadin Abs, IgG     <20 Units 1  Gliadin IgA     <20 Units 4  Reticulin Ab, IgA     NEGATIVE NEGATIVE  Reticulin IgA titer     <1:2.5 SEE NOTE  Tissue Transglutaminase Ab, IgA     <4 U/mL 1   Spirometry: FEV1: 1.87L   143%, FVC: 2.44L  143%, ratio consistent with non-obstructive pattern  Allergy testing: food skin prick testing slightly reactive to crab. Histamine was positive.   Milk, shrimp, oyster, lobster, scallop were negative   Allergy testing results were read and interpreted by provider, documented by clinical staff.   Assessment and plan:   Allergic rhinitis     - continue as needed use of long-actin antihistamine like Zyrtec 10mg , Allegra 180mg  or Xyzal 5mg  daily (all are over-the-counter) -- provided with Xyzal sample box     - continue use of nasal spray as needed for nasal congestion/drainage like Fluticasone, Nasacort 2 sprays each nostril -- provided with Nasacort sample box    - if symptoms  persist despite above regimen and you would like to resume allergen immunotherapy let us know and we will need to perform skin testing to environmental allergens prior to going back on allergy shots.    Food allergy    - food skin  prick testing today was slightly positive to crab    - at this time recommend continued avoidance of straight dairy products (keep baked dairy products in diet) and shellfish.     - recommend a in-office shrimp challenge to determine if you are truly allergy to shrimp or if you can re-introduce in diet.      - have access to Epipen 0.3mg  for as needed use in case of allergic reaction and follow emergency action plan provided today.    Asthma, mild intermittent vs upper airway cough syndrome   - at this time symptoms are well-controlled   - Given her history I would like for her to have access to a rescue inhaler -- albuterol inhaler 2 puffs every 4-6 hours as needed for cough, wheeze, difficulty breathing or chest tightness.  Monitor frequency of use.    Follow-up 6 months or sooner if needed (or for shrimp challenge)  I appreciate the opportunity to take part in Loyce's care. Please do not hesitate to contact me with questions.  Sincerely,   Prudy Feeler, MD Allergy/Immunology [facility]

## 2016-11-11 DIAGNOSIS — R3915 Urgency of urination: Secondary | ICD-10-CM | POA: Diagnosis not present

## 2016-11-11 DIAGNOSIS — R350 Frequency of micturition: Secondary | ICD-10-CM | POA: Diagnosis not present

## 2016-11-11 DIAGNOSIS — I10 Essential (primary) hypertension: Secondary | ICD-10-CM | POA: Diagnosis not present

## 2016-11-11 DIAGNOSIS — E785 Hyperlipidemia, unspecified: Secondary | ICD-10-CM | POA: Diagnosis not present

## 2016-11-11 DIAGNOSIS — R0789 Other chest pain: Secondary | ICD-10-CM | POA: Diagnosis not present

## 2016-11-11 DIAGNOSIS — M62838 Other muscle spasm: Secondary | ICD-10-CM | POA: Diagnosis not present

## 2016-11-11 DIAGNOSIS — M940 Chondrocostal junction syndrome [Tietze]: Secondary | ICD-10-CM | POA: Diagnosis not present

## 2016-12-16 DIAGNOSIS — H11153 Pinguecula, bilateral: Secondary | ICD-10-CM | POA: Diagnosis not present

## 2017-01-09 DIAGNOSIS — E119 Type 2 diabetes mellitus without complications: Secondary | ICD-10-CM | POA: Diagnosis not present

## 2017-01-30 DIAGNOSIS — G4733 Obstructive sleep apnea (adult) (pediatric): Secondary | ICD-10-CM | POA: Diagnosis not present

## 2017-02-10 DIAGNOSIS — H20012 Primary iridocyclitis, left eye: Secondary | ICD-10-CM | POA: Diagnosis not present

## 2017-02-10 DIAGNOSIS — H25811 Combined forms of age-related cataract, right eye: Secondary | ICD-10-CM | POA: Diagnosis not present

## 2017-02-10 DIAGNOSIS — Z961 Presence of intraocular lens: Secondary | ICD-10-CM | POA: Diagnosis not present

## 2017-02-28 HISTORY — PX: OTHER SURGICAL HISTORY: SHX169

## 2017-04-02 ENCOUNTER — Other Ambulatory Visit: Payer: Self-pay | Admitting: Internal Medicine

## 2017-04-02 DIAGNOSIS — Z1239 Encounter for other screening for malignant neoplasm of breast: Secondary | ICD-10-CM

## 2017-04-22 ENCOUNTER — Ambulatory Visit (INDEPENDENT_AMBULATORY_CARE_PROVIDER_SITE_OTHER): Payer: PPO | Admitting: Acute Care

## 2017-04-22 ENCOUNTER — Telehealth: Payer: Self-pay | Admitting: Internal Medicine

## 2017-04-22 ENCOUNTER — Encounter: Payer: Self-pay | Admitting: Acute Care

## 2017-04-22 DIAGNOSIS — J329 Chronic sinusitis, unspecified: Secondary | ICD-10-CM | POA: Diagnosis not present

## 2017-04-22 LAB — NITRIC OXIDE: Nitric Oxide: 17

## 2017-04-22 MED ORDER — PREDNISONE 10 MG PO TABS: ORAL_TABLET | ORAL | 0 refills | Status: DC

## 2017-04-22 MED ORDER — LEVALBUTEROL HCL 0.63 MG/3ML IN NEBU
0.6300 mg | INHALATION_SOLUTION | Freq: Once | RESPIRATORY_TRACT | Status: AC
  Administered 2017-04-22: 0.63 mg via RESPIRATORY_TRACT

## 2017-04-22 MED ORDER — AMOXICILLIN-POT CLAVULANATE 875-125 MG PO TABS
1.0000 | ORAL_TABLET | Freq: Two times a day (BID) | ORAL | 0 refills | Status: DC
Start: 2017-04-22 — End: 2017-06-02

## 2017-04-22 MED ORDER — METHYLPREDNISOLONE ACETATE 80 MG/ML IJ SUSP
80.0000 mg | Freq: Once | INTRAMUSCULAR | Status: AC
  Administered 2017-04-22: 80 mg via INTRAMUSCULAR

## 2017-04-22 NOTE — Patient Instructions (Addendum)
It is nice to meet you today. We will do a breathing treatment now. ( Xopenex) We will treat you with Augmentin 875 mg every 12 hours ( twice daily ) x 7 days.  Prednisone taper; 10 mg tablets: 4 tabs x 2 days, 3 tabs x 2 days, 2 tabs x 2 days 1 tab x 2 days then stop. Nasocort sinus spray for nasal congestion. Sinus rinses with Neti Pott. Sips of water instead of throat clearing Sugar free hard candies for throat soothing Zyrtec once daily for post nasal drip. Avoid mint, menthol, and oil based antibiotics. Follow up with Dr. Annamaria Boots 06/02/2017 as is scheduled. Follow up next week if you are not better. Please contact office for sooner follow up if symptoms do not improve or worsen or seek emergency care

## 2017-04-22 NOTE — Assessment & Plan Note (Addendum)
Sinusitis  Afebrile, but bloody secretions and sinus pain upon palpation Plan: FENO is 17 ppb ( less likely asthma) We will do a breathing treatment now. ( Xopenex) We will treat you with Augmentin 875 mg every 12 hours ( twice daily ) x 7 days.  Prednisone taper; 10 mg tablets: 4 tabs x 2 days, 3 tabs x 2 days, 2 tabs x 2 days 1 tab x 2 days then stop. Nasocort sinus spray for nasal congestion. Sinus rinses with Neti Pott. Sips of water instead of throat clearing Sugar free hard candies for throat soothing Zyrtec once daily for post nasal drip. Avoid mint, menthol, and oil based antibiotics. Follow up with Dr. Annamaria Boots 06/02/2017 as is scheduled. Follow up next week if you are not better. Please contact office for sooner follow up if symptoms do not improve or worsen or seek emergency care

## 2017-04-22 NOTE — Progress Notes (Signed)
History of Present Illness Mindy Ingram is a 71 y.o. female never smoker followed by Dr. Annamaria Boots for upper airway cough/ allergic rhinitis/osa.     04/22/2017 Acute OV: Pt presents for an acute OV: She stares she is having an asthma flare, which sounds more like a sinusitis.. She states she has no fever, but has sinus congestion and headache.with sneezing. She states she had yellow secretions yesterday and is so congested that she is not blowing anything . She states she has blood streaks in the sputum. She states on Sunday she made 2 home visits where there were  powerful home deodorizers, which she thinks has triggered this again. She states she has been wheezing. She is not coughing.She denies fever,chest pain, orthopnea  Or hemoptysis.  Test Results:  FENO: 17  CBC Latest Ref Rng & Units 09/19/2008  Hemoglobin 12.0 - 15.0 g/dL 11.1(L)    BMP Latest Ref Rng & Units 09/14/2008  Glucose 70 - 99 mg/dL 146(H)  BUN 6 - 23 mg/dL 17  Creatinine 0.4 - 1.2 mg/dL 0.92  Sodium 135 - 145 mEq/L 139  Potassium 3.5 - 5.1 mEq/L 3.8  Chloride 96 - 112 mEq/L 105  CO2 19 - 32 mEq/L 27  Calcium 8.4 - 10.5 mg/dL 9.3     Past medical hx Past Medical History:  Diagnosis Date  . Allergic rhinitis   . Asthma   . Bronchitis    none in years  . DJD (degenerative joint disease)   . Fibromyalgia   . GERD (gastroesophageal reflux disease)   . Hypertension   . OSA (obstructive sleep apnea)    cpap setting of 10  . Otitis media    both ears with impacted cerumem  . Rapid heartbeat    no metoprolol lin pm  . Sleep apnea   . Type II or unspecified type diabetes mellitus without mention of complication, not stated as uncontrolled      Social History  Substance Use Topics  . Smoking status: Never Smoker  . Smokeless tobacco: Never Used  . Alcohol use No    Ms.Hendler reports that she has never smoked. She has never used smokeless tobacco. She reports that she does not drink alcohol or  use drugs.  Tobacco Cessation: Never smoker  Past surgical hx, Family hx, Social hx all reviewed.  Current Outpatient Prescriptions on File Prior to Visit  Medication Sig  . albuterol (PROVENTIL HFA;VENTOLIN HFA) 108 (90 Base) MCG/ACT inhaler Inhale 2 puffs every 4-6 hours as needed  . aspirin 325 MG tablet Take 325 mg by mouth daily.  . Cholecalciferol (VITAMIN D PO) Take 1 tablet by mouth daily.  . cyclobenzaprine (FLEXERIL) 10 MG tablet Take 10 mg by mouth as needed for muscle spasms. As needed  . metFORMIN (GLUCOPHAGE-XR) 500 MG 24 hr tablet Take 1,000 mg by mouth daily.  . metoprolol (TOPROL-XL) 200 MG 24 hr tablet Take 200 mg by mouth daily.  . Multiple Vitamins-Minerals (COMPLETE ENERGY) TABS Take 1 tablet by mouth daily.   . nitroGLYCERIN (NITROSTAT) 0.4 MG SL tablet Place 0.4 mg under the tongue every 5 (five) minutes as needed for chest pain.  . rosuvastatin (CRESTOR) 10 MG tablet Take 10 mg by mouth 2 (two) times a week.  . traMADol (ULTRAM) 50 MG tablet Take 1.5 tablets (75 mg total) by mouth every 6 (six) hours as needed for severe pain (or cough). pain  . triamcinolone (KENALOG) 0.025 % ointment Apply 1 application topically 2 (two) times  daily.  . fluticasone (FLONASE) 50 MCG/ACT nasal spray Place 2 sprays into the nose daily as needed for allergies.   No current facility-administered medications on file prior to visit.      Allergies  Allergen Reactions  . Hydrocodone-Acetaminophen     REACTION: sore throat with vicodin  . Oxycontin [Oxycodone Hcl]     "makes me crazy"  . Prochlorperazine Edisylate     REACTION: stroke-like symptoms with companzine  . Percodan [Oxycodone-Aspirin] Palpitations    Fast heartbeat with percodan    Review Of Systems:  Constitutional:   No  weight loss, night sweats,  Fevers, chills, fatigue, or  lassitude.  HEENT:   No headaches,  Difficulty swallowing,  Tooth/dental problems, or  Sore throat,                No sneezing, itching,  ear ache, +nasal congestion,+ post nasal drip,   CV:  No chest pain,  Orthopnea, PND, swelling in lower extremities, anasarca, dizziness, palpitations, syncope.   GI  No heartburn, indigestion, abdominal pain, nausea, vomiting, diarrhea, change in bowel habits, loss of appetite, bloody stools.   Resp: No shortness of breath with exertion or at rest.  + excess mucus, no productive cough,  No non-productive cough,  No coughing up of blood.  + change in color of mucus.  + wheezing.  No chest wall deformity  Skin: no rash or lesions.  GU: no dysuria, change in color of urine, no urgency or frequency.  No flank pain, no hematuria   MS:  No joint pain or swelling.  No decreased range of motion.  No back pain.  Psych:  No change in mood or affect. No depression or anxiety.  No memory loss.   Vital Signs BP 132/70 (BP Location: Left Arm, Cuff Size: Normal)   Pulse 72   Temp (!) 97.5 F (36.4 C) (Oral)   Ht 4\' 11"  (1.499 m)   Wt 152 lb (68.9 kg)   SpO2 97%   BMI 30.70 kg/m    Physical Exam:  General- No distress,  A&Ox3, pleasant ENT: No sinus tenderness, TM clear, pale nasal mucosa, no oral exudate,no post nasal drip, no LAN Cardiac: S1, S2, regular rate and rhythm, no murmur Chest: Few wheezes/no rales/ dullness; no accessory muscle use, no nasal flaring, no sternal retractions Abd.: Soft Non-tender, non-distended Ext: No clubbing cyanosis, edema Neuro:  normal strength, cranial nerves intact Skin: No rashes, warm and dry Psych: normal mood and behavior   Assessment/Plan  Sinusitis Sinusitis  Afebrile, but bloody secretions and sinus pain upon palpation Plan: FENO is 17 ppb ( less likely asthma) We will do a breathing treatment now. ( Xopenex) We will treat you with Augmentin 875 mg every 12 hours ( twice daily ) x 7 days.  Prednisone taper; 10 mg tablets: 4 tabs x 2 days, 3 tabs x 2 days, 2 tabs x 2 days 1 tab x 2 days then stop. Nasocort sinus spray for nasal  congestion. Sinus rinses with Neti Pott. Sips of water instead of throat clearing Sugar free hard candies for throat soothing Zyrtec once daily for post nasal drip. Avoid mint, menthol, and oil based antibiotics. Follow up with Dr. Annamaria Boots 06/02/2017 as is scheduled. Follow up next week if you are not better. Please contact office for sooner follow up if symptoms do not improve or worsen or seek emergency care      Magdalen Spatz, NP 04/22/2017  2:36 PM

## 2017-04-22 NOTE — Telephone Encounter (Signed)
Patient stated that her asthma/allergies have been bothering her for the past few days. She was requesting to see CY today but he did not have any appointments. I was able to schedule her with SG at 215. She verbalized understanding. Nothing else needed at time of call.

## 2017-04-22 NOTE — Telephone Encounter (Signed)
lmtcb X1 for pt. abx prescribed today by SG was in fact sent to [place].

## 2017-04-22 NOTE — Addendum Note (Signed)
Addended by: Jannette Spanner on: 04/22/2017 02:54 PM   Modules accepted: Orders

## 2017-04-23 NOTE — Telephone Encounter (Signed)
lmtcb x2 for pt. 

## 2017-04-23 NOTE — Telephone Encounter (Signed)
Pt returned phone call; advised the medication was sent to [place].Marland Kitchen

## 2017-04-30 ENCOUNTER — Ambulatory Visit
Admission: RE | Admit: 2017-04-30 | Discharge: 2017-04-30 | Disposition: A | Discharge status: Home / Self Care | Payer: PPO | Source: Ambulatory Visit | Attending: Internal Medicine | Admitting: Internal Medicine

## 2017-04-30 DIAGNOSIS — G4730 Sleep apnea, unspecified: Secondary | ICD-10-CM | POA: Diagnosis not present

## 2017-04-30 DIAGNOSIS — E559 Vitamin D deficiency, unspecified: Secondary | ICD-10-CM | POA: Diagnosis not present

## 2017-04-30 DIAGNOSIS — J45909 Unspecified asthma, uncomplicated: Secondary | ICD-10-CM | POA: Diagnosis not present

## 2017-04-30 DIAGNOSIS — Z1239 Encounter for other screening for malignant neoplasm of breast: Secondary | ICD-10-CM

## 2017-04-30 DIAGNOSIS — Z1231 Encounter for screening mammogram for malignant neoplasm of breast: Secondary | ICD-10-CM | POA: Diagnosis not present

## 2017-04-30 DIAGNOSIS — J309 Allergic rhinitis, unspecified: Secondary | ICD-10-CM | POA: Diagnosis not present

## 2017-04-30 DIAGNOSIS — Z79899 Other long term (current) drug therapy: Secondary | ICD-10-CM | POA: Diagnosis not present

## 2017-04-30 DIAGNOSIS — Z7984 Long term (current) use of oral hypoglycemic drugs: Secondary | ICD-10-CM | POA: Diagnosis not present

## 2017-04-30 DIAGNOSIS — E785 Hyperlipidemia, unspecified: Secondary | ICD-10-CM | POA: Diagnosis not present

## 2017-04-30 DIAGNOSIS — G4762 Sleep related leg cramps: Secondary | ICD-10-CM | POA: Diagnosis not present

## 2017-04-30 DIAGNOSIS — B001 Herpesviral vesicular dermatitis: Secondary | ICD-10-CM | POA: Diagnosis not present

## 2017-04-30 DIAGNOSIS — I10 Essential (primary) hypertension: Secondary | ICD-10-CM | POA: Diagnosis not present

## 2017-04-30 DIAGNOSIS — E119 Type 2 diabetes mellitus without complications: Secondary | ICD-10-CM | POA: Diagnosis not present

## 2017-05-05 ENCOUNTER — Ambulatory Visit: Payer: PPO | Admitting: Allergy

## 2017-06-02 ENCOUNTER — Ambulatory Visit: Payer: PPO | Admitting: Internal Medicine

## 2017-06-02 ENCOUNTER — Ambulatory Visit (INDEPENDENT_AMBULATORY_CARE_PROVIDER_SITE_OTHER): Payer: PPO | Admitting: Internal Medicine

## 2017-06-02 ENCOUNTER — Encounter: Payer: Self-pay | Admitting: Internal Medicine

## 2017-06-02 DIAGNOSIS — J3089 Other allergic rhinitis: Secondary | ICD-10-CM | POA: Diagnosis not present

## 2017-06-02 DIAGNOSIS — J4520 Mild intermittent asthma, uncomplicated: Secondary | ICD-10-CM

## 2017-06-02 DIAGNOSIS — G4733 Obstructive sleep apnea (adult) (pediatric): Secondary | ICD-10-CM

## 2017-06-02 DIAGNOSIS — J302 Other seasonal allergic rhinitis: Secondary | ICD-10-CM

## 2017-06-02 MED ORDER — ALBUTEROL SULFATE HFA 108 (90 BASE) MCG/ACT IN AERS: INHALATION_SPRAY | RESPIRATORY_TRACT | 12 refills | Status: DC

## 2017-06-02 MED ORDER — FLUTICASONE PROPIONATE 50 MCG/ACT NA SUSP: NASAL | 12 refills | Status: DC

## 2017-06-02 NOTE — Assessment & Plan Note (Signed)
She is comfortable with current pressure.  Machine is 71 years old.  She asks about changing DME companies when she gets new insurance after the first of the year so we will use that time to change her to a new CPAP machine with [employer]ap 5-15

## 2017-06-02 NOTE — Patient Instructions (Addendum)
Scripts printed refilling albuterol rescue inhaler for asthma  Please call us after the first of the year when you have your new insurance information. I expect we will be able to help you change your DME company from [employer] and get you a new autoPAP CPAP machine set at 5-15, as discussed.  Please call if we can help

## 2017-06-02 NOTE — Assessment & Plan Note (Signed)
Well-controlled with very rare need for rescue inhaler.  She just asked for a printed prescription.

## 2017-06-02 NOTE — Progress Notes (Signed)
Subjective:    Patient ID: Mindy Ingram, female    DOB: 03-26-1946, 71 y.o.   MRN: [id number]  HPI F never smoker followed for obstructive sleep apnea, allergic rhinitis complicated by DM, bronchitis, GERD. Office Spirometry 04/03/2015- WNL FVC 2.14/110%, FEV1 1.94/128%, FEV1/FVC 0.91 . NPSG 11/12/10- AHI  17.9/ hr, desat to 76% titrated to CPAP 12, body weight 165 lbs Office spirometry- 11/04/16--by allergy office- FEV1/FVC 0.77 WNL -------------------------------------------------------------------------------.  04/02/2016-71 year old female never smoker followed for OSA, allergic rhinitis, complicated by DM, bronchitis, GERD CPAP 10/Advanced FOLLOWS FOR: DME: [facility]. DL attached. Pt wears CPAP nightly for about 6 hours. Pressure works well for patient and needs order for new supplies  Download shows good compliance 94%/4 hours, good control AHI 3.1/hour she has been comfortable with CPAP and sleeping better with it. Concerned about bumps on skin of face over last 6 weeks-roughly in the area of her fullface mask. No itching or burning. She has seen a dermatologist. No biopsy. Increased dry cough without wheeze, nasal stuffiness. Blames fall season.  06/02/17- 71 year old female never smoker followed for OSA, complicated by allergic rhinitis, asthmatic bronchitis, DM2, GERD,  CPAP 10/Advanced ----OSA; DME:[facility]-pt would like to change DME's at first of year with new insurance. DL attached. Pt wears CPAP nightly. Office spirometry- 11/04/16--by allergy office- FEV1/FVC 0.77 WNL CPAP download 92% compliance, AHI 3.4/hour.  She is comfortable with the pressure.  Feels better and sleeps better with CPAP. Bothered by nasal stuffiness with some seasonal variation.  We discussed Flonase and she continues saline nasal rinse.  Review of Systems- see HPI + = positive Constitutional:   No-   weight loss, night sweats, fevers, chills, fatigue, lassitude. HEENT:   No-  headaches, difficulty swallowing,  tooth/dental problems, sore throat,       No-  sneezing, itching, ear ache, +nasal congestion,+post nasal drip,  CV:  No-   chest pain, orthopnea, PND, swelling in lower extremities, anasarca, dizziness, palpitations Resp: No-   shortness of breath with exertion or at rest.              No-   productive cough,  No non-productive cough,  No-  coughing up of blood.              No-   change in color of mucus.  No- wheezing.   Skin:  GI:  No-   heartburn, indigestion, abdominal pain, nausea, vomiting,  GU:  MS:  No-   joint pain or swelling.   Neuro- nothing unusual  Psych:  No- change in mood or affect. No depression or anxiety.  No memory loss.    Objective:   Physical Exam General- Alert, Oriented, Affect-appropriate, Distress- none acute Skin- +Raised nodules right nasolabial fold without erythema or excoriation Lymphadenopathy- none Head- atraumatic            Eyes- Gross vision intact, PERRLA, conjunctivae clear secretions            Ears-hearing normal            Nose- +turbinate edema, No-Septal dev, mucus+, No-polyps, erosion, perforation             Throat- Mallampati II , mucosa clear , drainage- none, tonsils- atrophic Neck- flexible , trachea midline, no stridor , thyroid nl, carotid no bruit Chest - symmetrical excursion , unlabored           Heart/CV- RRR , no murmur , no gallop  , no rub, nl s1 s2                           -  JVD- none , edema- none, stasis changes- none, varices- none           Lung- clear to P&A, wheeze-none, unlabored, cough-none, dullness-none, rub- none           Chest wall-  Abd- Br/ Gen/ Rectal- Not done, not indicated Extrem- cyanosis- none, clubbing, none, atrophy- none, strength- nl. Cane. Neuro- grossly intact to observation Assessment & Plan:

## 2017-06-02 NOTE — Assessment & Plan Note (Signed)
We discussed OTC meds.  She is careful not to use decongestant nasal sprays but continues Flonase.  Prescription refilled at her request.

## 2017-06-12 DIAGNOSIS — Z1211 Encounter for screening for malignant neoplasm of colon: Secondary | ICD-10-CM | POA: Diagnosis not present

## 2017-06-12 DIAGNOSIS — Z86010 Personal history of colonic polyps: Secondary | ICD-10-CM | POA: Diagnosis not present

## 2017-06-12 LAB — HM COLONOSCOPY

## 2017-06-25 DIAGNOSIS — E119 Type 2 diabetes mellitus without complications: Secondary | ICD-10-CM | POA: Diagnosis not present

## 2017-06-25 DIAGNOSIS — Z961 Presence of intraocular lens: Secondary | ICD-10-CM | POA: Diagnosis not present

## 2017-06-25 DIAGNOSIS — H2511 Age-related nuclear cataract, right eye: Secondary | ICD-10-CM | POA: Diagnosis not present

## 2017-06-25 DIAGNOSIS — H43811 Vitreous degeneration, right eye: Secondary | ICD-10-CM | POA: Diagnosis not present

## 2017-06-26 DIAGNOSIS — E785 Hyperlipidemia, unspecified: Secondary | ICD-10-CM | POA: Diagnosis not present

## 2017-06-26 DIAGNOSIS — Z7984 Long term (current) use of oral hypoglycemic drugs: Secondary | ICD-10-CM | POA: Diagnosis not present

## 2017-06-26 DIAGNOSIS — J45909 Unspecified asthma, uncomplicated: Secondary | ICD-10-CM | POA: Diagnosis not present

## 2017-06-26 DIAGNOSIS — E119 Type 2 diabetes mellitus without complications: Secondary | ICD-10-CM | POA: Diagnosis not present

## 2017-06-26 DIAGNOSIS — I10 Essential (primary) hypertension: Secondary | ICD-10-CM | POA: Diagnosis not present

## 2017-07-14 ENCOUNTER — Telehealth: Payer: Self-pay | Admitting: Internal Medicine

## 2017-07-14 DIAGNOSIS — G4733 Obstructive sleep apnea (adult) (pediatric): Secondary | ICD-10-CM

## 2017-07-14 NOTE — Telephone Encounter (Signed)
Spoke with pt. She is needing an order sent to [facility] for new CPAP supplies. Order has been placed. Nothing further was needed.

## 2017-08-11 DIAGNOSIS — G5791 Unspecified mononeuropathy of right lower limb: Secondary | ICD-10-CM | POA: Diagnosis not present

## 2017-08-11 DIAGNOSIS — M2041 Other hammer toe(s) (acquired), right foot: Secondary | ICD-10-CM

## 2017-08-11 DIAGNOSIS — E119 Type 2 diabetes mellitus without complications: Secondary | ICD-10-CM | POA: Diagnosis not present

## 2017-08-11 HISTORY — DX: Other hammer toe(s) (acquired), right foot: M20.41

## 2017-08-28 DIAGNOSIS — J309 Allergic rhinitis, unspecified: Secondary | ICD-10-CM | POA: Diagnosis not present

## 2017-08-28 DIAGNOSIS — K219 Gastro-esophageal reflux disease without esophagitis: Secondary | ICD-10-CM | POA: Diagnosis not present

## 2017-08-28 DIAGNOSIS — R05 Cough: Secondary | ICD-10-CM | POA: Diagnosis not present

## 2017-09-16 DIAGNOSIS — H04123 Dry eye syndrome of bilateral lacrimal glands: Secondary | ICD-10-CM | POA: Diagnosis not present

## 2017-09-25 DIAGNOSIS — G4733 Obstructive sleep apnea (adult) (pediatric): Secondary | ICD-10-CM | POA: Diagnosis not present

## 2017-10-27 DIAGNOSIS — E119 Type 2 diabetes mellitus without complications: Secondary | ICD-10-CM | POA: Diagnosis not present

## 2017-10-27 DIAGNOSIS — I10 Essential (primary) hypertension: Secondary | ICD-10-CM | POA: Diagnosis not present

## 2017-10-27 DIAGNOSIS — J45909 Unspecified asthma, uncomplicated: Secondary | ICD-10-CM | POA: Diagnosis not present

## 2017-10-27 DIAGNOSIS — Z7984 Long term (current) use of oral hypoglycemic drugs: Secondary | ICD-10-CM | POA: Diagnosis not present

## 2017-10-27 DIAGNOSIS — E785 Hyperlipidemia, unspecified: Secondary | ICD-10-CM | POA: Diagnosis not present

## 2017-10-29 DIAGNOSIS — E559 Vitamin D deficiency, unspecified: Secondary | ICD-10-CM | POA: Diagnosis not present

## 2017-10-29 DIAGNOSIS — J45909 Unspecified asthma, uncomplicated: Secondary | ICD-10-CM | POA: Diagnosis not present

## 2017-10-29 DIAGNOSIS — M2550 Pain in unspecified joint: Secondary | ICD-10-CM | POA: Diagnosis not present

## 2017-10-29 DIAGNOSIS — M2041 Other hammer toe(s) (acquired), right foot: Secondary | ICD-10-CM | POA: Diagnosis not present

## 2017-10-29 DIAGNOSIS — Z1389 Encounter for screening for other disorder: Secondary | ICD-10-CM | POA: Diagnosis not present

## 2017-10-29 DIAGNOSIS — Z0000 Encounter for general adult medical examination without abnormal findings: Secondary | ICD-10-CM | POA: Diagnosis not present

## 2017-10-29 DIAGNOSIS — E785 Hyperlipidemia, unspecified: Secondary | ICD-10-CM | POA: Diagnosis not present

## 2017-10-29 DIAGNOSIS — E119 Type 2 diabetes mellitus without complications: Secondary | ICD-10-CM | POA: Diagnosis not present

## 2017-10-29 DIAGNOSIS — I10 Essential (primary) hypertension: Secondary | ICD-10-CM | POA: Diagnosis not present

## 2017-10-29 DIAGNOSIS — L84 Corns and callosities: Secondary | ICD-10-CM | POA: Diagnosis not present

## 2017-10-30 DIAGNOSIS — J45909 Unspecified asthma, uncomplicated: Secondary | ICD-10-CM | POA: Diagnosis not present

## 2017-10-30 DIAGNOSIS — E785 Hyperlipidemia, unspecified: Secondary | ICD-10-CM | POA: Diagnosis not present

## 2017-10-30 DIAGNOSIS — I10 Essential (primary) hypertension: Secondary | ICD-10-CM | POA: Diagnosis not present

## 2017-10-30 DIAGNOSIS — Z7984 Long term (current) use of oral hypoglycemic drugs: Secondary | ICD-10-CM | POA: Diagnosis not present

## 2017-10-30 DIAGNOSIS — E119 Type 2 diabetes mellitus without complications: Secondary | ICD-10-CM | POA: Diagnosis not present

## 2017-12-01 ENCOUNTER — Ambulatory Visit: Payer: PPO | Admitting: Internal Medicine

## 2017-12-10 DIAGNOSIS — J45909 Unspecified asthma, uncomplicated: Secondary | ICD-10-CM | POA: Diagnosis not present

## 2017-12-10 DIAGNOSIS — E119 Type 2 diabetes mellitus without complications: Secondary | ICD-10-CM | POA: Diagnosis not present

## 2017-12-10 DIAGNOSIS — Z7984 Long term (current) use of oral hypoglycemic drugs: Secondary | ICD-10-CM | POA: Diagnosis not present

## 2017-12-10 DIAGNOSIS — E785 Hyperlipidemia, unspecified: Secondary | ICD-10-CM | POA: Diagnosis not present

## 2017-12-10 DIAGNOSIS — I10 Essential (primary) hypertension: Secondary | ICD-10-CM | POA: Diagnosis not present

## 2018-01-19 DIAGNOSIS — Z7984 Long term (current) use of oral hypoglycemic drugs: Secondary | ICD-10-CM | POA: Diagnosis not present

## 2018-01-19 DIAGNOSIS — E785 Hyperlipidemia, unspecified: Secondary | ICD-10-CM | POA: Diagnosis not present

## 2018-01-19 DIAGNOSIS — E119 Type 2 diabetes mellitus without complications: Secondary | ICD-10-CM | POA: Diagnosis not present

## 2018-01-19 DIAGNOSIS — J45909 Unspecified asthma, uncomplicated: Secondary | ICD-10-CM | POA: Diagnosis not present

## 2018-01-19 DIAGNOSIS — I10 Essential (primary) hypertension: Secondary | ICD-10-CM | POA: Diagnosis not present

## 2018-02-10 DIAGNOSIS — Z7984 Long term (current) use of oral hypoglycemic drugs: Secondary | ICD-10-CM | POA: Diagnosis not present

## 2018-02-10 DIAGNOSIS — J45909 Unspecified asthma, uncomplicated: Secondary | ICD-10-CM | POA: Diagnosis not present

## 2018-02-10 DIAGNOSIS — I10 Essential (primary) hypertension: Secondary | ICD-10-CM | POA: Diagnosis not present

## 2018-02-10 DIAGNOSIS — E119 Type 2 diabetes mellitus without complications: Secondary | ICD-10-CM | POA: Diagnosis not present

## 2018-02-10 DIAGNOSIS — E785 Hyperlipidemia, unspecified: Secondary | ICD-10-CM | POA: Diagnosis not present

## 2018-02-18 ENCOUNTER — Ambulatory Visit: Payer: PPO | Admitting: Internal Medicine

## 2018-03-11 ENCOUNTER — Other Ambulatory Visit: Payer: Self-pay | Admitting: Internal Medicine

## 2018-03-11 DIAGNOSIS — L84 Corns and callosities: Secondary | ICD-10-CM | POA: Diagnosis not present

## 2018-03-11 DIAGNOSIS — L989 Disorder of the skin and subcutaneous tissue, unspecified: Secondary | ICD-10-CM | POA: Diagnosis not present

## 2018-03-11 DIAGNOSIS — W57XXXA Bitten or stung by nonvenomous insect and other nonvenomous arthropods, initial encounter: Secondary | ICD-10-CM | POA: Diagnosis not present

## 2018-03-11 DIAGNOSIS — E119 Type 2 diabetes mellitus without complications: Secondary | ICD-10-CM | POA: Diagnosis not present

## 2018-03-11 DIAGNOSIS — Z23 Encounter for immunization: Secondary | ICD-10-CM | POA: Diagnosis not present

## 2018-03-11 DIAGNOSIS — J45909 Unspecified asthma, uncomplicated: Secondary | ICD-10-CM | POA: Diagnosis not present

## 2018-03-11 DIAGNOSIS — M7712 Lateral epicondylitis, left elbow: Secondary | ICD-10-CM | POA: Diagnosis not present

## 2018-03-11 DIAGNOSIS — E785 Hyperlipidemia, unspecified: Secondary | ICD-10-CM | POA: Diagnosis not present

## 2018-03-11 DIAGNOSIS — Z1231 Encounter for screening mammogram for malignant neoplasm of breast: Secondary | ICD-10-CM

## 2018-03-11 DIAGNOSIS — I10 Essential (primary) hypertension: Secondary | ICD-10-CM | POA: Diagnosis not present

## 2018-03-15 ENCOUNTER — Encounter: Payer: Self-pay | Admitting: Acute Care

## 2018-03-15 ENCOUNTER — Ambulatory Visit (INDEPENDENT_AMBULATORY_CARE_PROVIDER_SITE_OTHER): Payer: PPO | Admitting: Acute Care

## 2018-03-15 VITALS — BP 124/72 | HR 60 | Ht 59.0 in | Wt 145.8 lb

## 2018-03-15 DIAGNOSIS — J4521 Mild intermittent asthma with (acute) exacerbation: Secondary | ICD-10-CM | POA: Diagnosis not present

## 2018-03-15 DIAGNOSIS — Z0000 Encounter for general adult medical examination without abnormal findings: Secondary | ICD-10-CM

## 2018-03-15 DIAGNOSIS — J4520 Mild intermittent asthma, uncomplicated: Secondary | ICD-10-CM

## 2018-03-15 DIAGNOSIS — G4733 Obstructive sleep apnea (adult) (pediatric): Secondary | ICD-10-CM | POA: Diagnosis not present

## 2018-03-15 DIAGNOSIS — K219 Gastro-esophageal reflux disease without esophagitis: Secondary | ICD-10-CM | POA: Diagnosis not present

## 2018-03-15 DIAGNOSIS — Z23 Encounter for immunization: Secondary | ICD-10-CM | POA: Insufficient documentation

## 2018-03-15 DIAGNOSIS — G5602 Carpal tunnel syndrome, left upper limb: Secondary | ICD-10-CM | POA: Diagnosis not present

## 2018-03-15 HISTORY — DX: Encounter for immunization: Z23

## 2018-03-15 MED ORDER — ALBUTEROL SULFATE HFA 108 (90 BASE) MCG/ACT IN AERS: INHALATION_SPRAY | RESPIRATORY_TRACT | 12 refills | Status: DC

## 2018-03-15 NOTE — Assessment & Plan Note (Signed)
AHI is 4.1 at 10 cm H2O Pressure  Mask leak 100% compliance Plan: We will order change in settings Please place order for auto set 5-15 cm H2O We will get a down Load 1 month after setting change to ensure adequate control. We will place an order to check your machine to make sure it is working properly. Work on mask leak as your down load  showed some increase in leaks.  The seal must be right to assure optimal functioning of the CPAP device .   Continue on CPAP at bedtime. You appear to be benefiting from the treatment Goal is to wear for at least 6 hours each night for maximal clinical benefit. Continue to work on weight loss, as the link between excess weight  and sleep apnea is well established.  Do not drive if sleepy. Remember to clean mask, tubing, filter, and reservoir once weekly with soapy water.  Follow up with Dr. Annamaria Boots or Judson Roch NP  In 6 months  or before as needed.

## 2018-03-15 NOTE — Assessment & Plan Note (Signed)
Stable interval She is using Flonase daily She is not using Zyrtec daily Plan: Continue Flonase once daily Consider adding Zyrtec generic and prilosec to see if these help with cough. Albuterol rescue inhaler preferred by [employer] prescription [facility]) as needed for shortness of breath or wheezing. Follow up with Dr. Nelva Bush as needed for allergy issues.

## 2018-03-15 NOTE — Assessment & Plan Note (Signed)
Cough and + ROS for reflux Plan: Consider adding Prilosec back into your medication routine Follow GERD Diet

## 2018-03-15 NOTE — Progress Notes (Signed)
History of Present Illness Mindy Ingram is a 72 y.o. female never smoker followed for OSA, allergic asthma, allergic rhinitis, complicated by DM, bronchitis, GERD. She is followed by Dr. Annamaria Boots.  CPAP: Set Pressure 10 cm H2O   03/15/2018  6 month follow up for asthma/ OSA/CPAP: Pt. Presents for 6 month  follow up. She states she has been doing well with her CPAP.She states she has been compliant, she has no issues with either her device or her therapy with the exception of being awakened one night last week with the sensation that she had stopped breathing. Her down Load does  Note significant leak. She states her asthma and allergies have been well controlled.She does still have some coughing which she thinks is drainage from the back of her throat. She denies any wheezing.She is not taking her Zyrtec or her Prilosec. We discussed resuming both. She does not like taking medications, and therefore she has agreed taking these as needed.She uses her rescue inhaler very infrequently. She denies fever, chest pain, orthopnea or hemoptysis.   Down Load Results: S9 Autoset Set pressure of 10 cm H2O 12/15/2017-03/14/2018 Usage 100% of the time Average usage: 7 hours 29 minutes AHI>> 4.1 Leak Max 115.8    Test Results: NPSG 11/12/10- AHI  17.9/ hr, desat to 76% titrated to CPAP 12, body weight 165 lbs Spirometry 10/2016>> Normal Respiratory Function                   03/2015>> Normal Respiratory Function  CBC Latest Ref Rng & Units 09/19/2008  Hemoglobin 12.0 - 15.0 g/dL 11.1(L)    BMP Latest Ref Rng & Units 09/14/2008  Glucose 70 - 99 mg/dL 146(H)  BUN 6 - 23 mg/dL 17  Creatinine 0.4 - 1.2 mg/dL 0.92  Sodium 135 - 145 mEq/L 139  Potassium 3.5 - 5.1 mEq/L 3.8  Chloride 96 - 112 mEq/L 105  CO2 19 - 32 mEq/L 27  Calcium 8.4 - 10.5 mg/dL 9.3    BNP No results found for: BNP  ProBNP No results found for: PROBNP  PFT No results found for: FEV1PRE, FEV1POST, FVCPRE, FVCPOST, TLC,  DLCOUNC, PREFEV1FVCRT, PSTFEV1FVCRT  No results found.   Past medical hx Past Medical History:  Diagnosis Date  . Allergic rhinitis   . Asthma   . Bronchitis    none in years  . DJD (degenerative joint disease)   . Fibromyalgia   . GERD (gastroesophageal reflux disease)   . Hypertension   . OSA (obstructive sleep apnea)    cpap setting of 10  . Otitis media    both ears with impacted cerumem  . Rapid heartbeat    no metoprolol lin pm  . Sleep apnea   . Type II or unspecified type diabetes mellitus without mention of complication, not stated as uncontrolled      Social History   Tobacco Use  . Smoking status: Never Smoker  . Smokeless tobacco: Never Used  Substance Use Topics  . Alcohol use: No  . Drug use: No    Ms.Courter reports that she has never smoked. She has never used smokeless tobacco. She reports that she does not drink alcohol or use drugs.  Tobacco Cessation: Never smoker  Past surgical hx, Family hx, Social hx all reviewed.  Current Outpatient Medications on File Prior to Visit  Medication Sig  . aspirin EC 81 MG tablet Take 81 mg by mouth daily.  . Cholecalciferol (VITAMIN D PO) Take 2,000 Units by  mouth daily.   . cyclobenzaprine (FLEXERIL) 10 MG tablet Take 10 mg by mouth as needed for muscle spasms. As needed  . fluticasone (FLONASE) 50 MCG/ACT nasal spray 1-2 puffs each nostril once daily  . irbesartan-hydrochlorothiazide (AVALIDE) 150-12.5 MG tablet TK 1 T PO QD  . meloxicam (MOBIC) 7.5 MG tablet Take 7.5 mg by mouth daily as needed.   . metFORMIN (GLUCOPHAGE-XR) 500 MG 24 hr tablet Take 1,000 mg by mouth daily.  . metoprolol (TOPROL-XL) 200 MG 24 hr tablet Take 200 mg by mouth daily.  . Multiple Vitamins-Minerals (COMPLETE ENERGY) TABS Take 1 tablet by mouth daily.   . nitroGLYCERIN (NITROSTAT) 0.4 MG SL tablet Place 0.4 mg under the tongue every 5 (five) minutes as needed for chest pain.  . rosuvastatin (CRESTOR) 10 MG tablet Take 10 mg by  mouth 2 (two) times a week.  . traMADol (ULTRAM) 50 MG tablet Take 1.5 tablets (75 mg total) by mouth every 6 (six) hours as needed for severe pain (or cough). pain  . triamcinolone (KENALOG) 0.025 % ointment Apply 1 application topically 2 (two) times daily.   No current facility-administered medications on file prior to visit.      Allergies  Allergen Reactions  . Dairy Aid [Lactase]     Hives, diarrhea  . Anoro Ellipta [Umeclidinium-Vilanterol]     Lactose allergy  . Compazine [Prochlorperazine Edisylate]   . Hydrocodone-Acetaminophen     REACTION: sore throat with vicodin  . Hydrocodone-Acetaminophen     Other reaction(s): Other (See Comments) Sore throat , like it is closing up on her REACTION: sore throat with vicodin   . Lipitor [Atorvastatin]     Severe myalgias  . Myrbetriq [Mirabegron]     Headache   . Oxycontin [Oxycodone Hcl]     "makes me crazy"  . Prochlorperazine Edisylate     REACTION: stroke-like symptoms with companzine  . Rosuvastatin     Can take brand crestor-severe myalgias  . Shellfish Allergy   . Percodan [Oxycodone-Aspirin] Palpitations    Fast heartbeat with percodan    Review Of Systems:  Constitutional:   No  weight loss, night sweats,  Fevers, chills, fatigue, or  lassitude.  HEENT:   No headaches,  Difficulty swallowing,  Tooth/dental problems, or  Sore throat,                No sneezing, itching, ear ache, + mild nasal congestion, +post nasal drip,   CV:  No chest pain,  Orthopnea, PND, swelling in lower extremities, anasarca, dizziness, palpitations, syncope.   GI  No heartburn,+ Mild  indigestion, No abdominal pain, nausea, vomiting, diarrhea, change in bowel habits, loss of appetite, bloody stools.   Resp: No shortness of breath with exertion or at rest.  No excess mucus, no productive cough,  + Mild  non-productive cough,  No coughing up of blood.  No change in color of mucus.  No wheezing.  No chest wall deformity  Skin: no rash or  lesions, warm, dry and intact.  GU: no dysuria, change in color of urine, no urgency or frequency.  No flank pain, no hematuria   MS:  No joint pain or swelling.  No decreased range of motion.  No back pain.  Psych:  No change in mood or affect. No depression or anxiety.  No memory loss.   Vital Signs BP 124/72 (BP Location: Left Arm, Cuff Size: Normal)   Pulse 60   Ht 4\' 11"  (1.499 m)   Wt  145 lb 12.8 oz (66.1 kg)   SpO2 97%   BMI 29.45 kg/m    Physical Exam:  General- No distress,  A&Ox3, pleasant ENT: No sinus tenderness, TM clear, pale nasal mucosa, no oral exudate,+ post nasal drip, no LAN Cardiac: S1, S2, regular rate and rhythm, no murmur Chest: No wheeze/ rales/ dullness; no accessory muscle use, no nasal flaring, no sternal retractions Abd.: Soft Non-tender, ND, BS + Ext: No clubbing cyanosis, edema Neuro:  normal strength, A&O x 3, MAE x 4, appropriate Skin: No rashes, warm and dry Psych: normal mood and behavior   Assessment/Plan  Obstructive sleep apnea AHI is 4.1 at 10 cm H2O Pressure  Mask leak 100% compliance Plan: We will order change in settings Please place order for auto set 5-15 cm H2O We will get a down Load 1 month after setting change to ensure adequate control. We will place an order to check your machine to make sure it is working properly. Work on mask leak as your down load  showed some increase in leaks.  The seal must be right to assure optimal functioning of the CPAP device .   Continue on CPAP at bedtime. You appear to be benefiting from the treatment Goal is to wear for at least 6 hours each night for maximal clinical benefit. Continue to work on weight loss, as the link between excess weight  and sleep apnea is well established.  Do not drive if sleepy. Remember to clean mask, tubing, filter, and reservoir once weekly with soapy water.  Follow up with Dr. Annamaria Boots or Judson Roch NP  In 6 months  or before as needed.     Asthmatic  bronchitis, mild intermittent, with acute exacerbation Stable interval She is using Flonase daily She is not using Zyrtec daily Plan: Continue Flonase once daily Consider adding Zyrtec generic and prilosec to see if these help with cough. Albuterol rescue inhaler preferred by [employer] prescription [facility]) as needed for shortness of breath or wheezing. Follow up with Dr. Nelva Bush as needed for allergy issues.   GERD Cough and + ROS for reflux Plan: Consider adding Prilosec back into your medication routine Follow GERD Diet  Healthcare maintenance Pt. Had flu vaccine 03/11/2018    Magdalen Spatz, NP 03/15/2018  3:03 PM

## 2018-03-15 NOTE — Assessment & Plan Note (Signed)
Pt. Had flu vaccine 03/11/2018

## 2018-03-15 NOTE — Patient Instructions (Addendum)
It is good to see you today. We will order change in settings Please place order for auto set 5-15 cm H2O We will get a down Load 1 month after setting change to ensure adequate control. We will place an order to check your machine to make sure it is working properly. Work on mask leak as your down load  showed some increase in leaks.  The seal must be right to assure optimal functioning of the CPAP device .   Continue on CPAP at bedtime. You appear to be benefiting from the treatment Goal is to wear for at least 6 hours each night for maximal clinical benefit. Continue to work on weight loss, as the link between excess weight  and sleep apnea is well established.  Do not drive if sleepy. Remember to clean mask, tubing, filter, and reservoir once weekly with soapy water.  Follow up with Dr. Annamaria Boots or Judson Roch NP  In 6 months  or before as needed.   Continue Flonase once daily Consider adding Zyrtec generic and prilosec to see if these help with cough. Albuterol rescue inhaler preferred by [employer] prescription [facility]) as needed for shortness of breath or wheezing. Follow up with Dr. Nelva Bush as needed for allergy issues.  Please contact office for sooner follow up if symptoms do not improve or worsen or seek emergency care

## 2018-03-24 DIAGNOSIS — R52 Pain, unspecified: Secondary | ICD-10-CM | POA: Insufficient documentation

## 2018-03-24 HISTORY — DX: Pain, unspecified: R52

## 2018-04-14 DIAGNOSIS — H04123 Dry eye syndrome of bilateral lacrimal glands: Secondary | ICD-10-CM | POA: Diagnosis not present

## 2018-04-14 DIAGNOSIS — H0289 Other specified disorders of eyelid: Secondary | ICD-10-CM | POA: Diagnosis not present

## 2018-04-14 DIAGNOSIS — H25811 Combined forms of age-related cataract, right eye: Secondary | ICD-10-CM | POA: Diagnosis not present

## 2018-04-14 DIAGNOSIS — E119 Type 2 diabetes mellitus without complications: Secondary | ICD-10-CM | POA: Diagnosis not present

## 2018-04-14 DIAGNOSIS — Z961 Presence of intraocular lens: Secondary | ICD-10-CM | POA: Diagnosis not present

## 2018-04-14 DIAGNOSIS — H43812 Vitreous degeneration, left eye: Secondary | ICD-10-CM | POA: Diagnosis not present

## 2018-04-18 ENCOUNTER — Encounter: Payer: Self-pay | Admitting: Internal Medicine

## 2018-04-21 DIAGNOSIS — E785 Hyperlipidemia, unspecified: Secondary | ICD-10-CM | POA: Diagnosis not present

## 2018-04-21 DIAGNOSIS — J45909 Unspecified asthma, uncomplicated: Secondary | ICD-10-CM | POA: Diagnosis not present

## 2018-04-21 DIAGNOSIS — I10 Essential (primary) hypertension: Secondary | ICD-10-CM | POA: Diagnosis not present

## 2018-04-21 DIAGNOSIS — E119 Type 2 diabetes mellitus without complications: Secondary | ICD-10-CM | POA: Diagnosis not present

## 2018-05-04 ENCOUNTER — Ambulatory Visit
Admission: RE | Admit: 2018-05-04 | Discharge: 2018-05-04 | Disposition: A | Discharge status: Home / Self Care | Payer: PPO | Source: Ambulatory Visit | Attending: Internal Medicine | Admitting: Internal Medicine

## 2018-05-04 DIAGNOSIS — Z1231 Encounter for screening mammogram for malignant neoplasm of breast: Secondary | ICD-10-CM

## 2018-05-06 ENCOUNTER — Other Ambulatory Visit: Payer: Self-pay | Admitting: Internal Medicine

## 2018-05-06 DIAGNOSIS — R928 Other abnormal and inconclusive findings on diagnostic imaging of breast: Secondary | ICD-10-CM

## 2018-05-10 ENCOUNTER — Other Ambulatory Visit: Payer: Self-pay | Admitting: Internal Medicine

## 2018-05-10 ENCOUNTER — Ambulatory Visit
Admission: RE | Admit: 2018-05-10 | Discharge: 2018-05-10 | Disposition: A | Discharge status: Home / Self Care | Payer: PPO | Source: Ambulatory Visit | Attending: Internal Medicine | Admitting: Internal Medicine

## 2018-05-10 DIAGNOSIS — R928 Other abnormal and inconclusive findings on diagnostic imaging of breast: Secondary | ICD-10-CM

## 2018-05-10 DIAGNOSIS — N6320 Unspecified lump in the left breast, unspecified quadrant: Secondary | ICD-10-CM

## 2018-05-10 DIAGNOSIS — N6489 Other specified disorders of breast: Secondary | ICD-10-CM | POA: Diagnosis not present

## 2018-05-11 ENCOUNTER — Other Ambulatory Visit: Payer: PPO

## 2018-05-13 ENCOUNTER — Telehealth: Payer: Self-pay | Admitting: Internal Medicine

## 2018-05-13 NOTE — Telephone Encounter (Signed)
Called patient unable to reach left message to give us a call back.

## 2018-05-17 NOTE — Telephone Encounter (Signed)
Patient returned phone call;contact # [phone]

## 2018-05-17 NOTE — Telephone Encounter (Signed)
Called [facility] back, no one answered. Will try again later.

## 2018-05-17 NOTE — Telephone Encounter (Signed)
Attempted to call pt but unable to reach her. Left message for pt to return call. 

## 2018-05-17 NOTE — Telephone Encounter (Signed)
Spoke with patient. She was checking to see if we had received a CPAP report from [place] [facility]. Per patient, this was sent over at least a week or so ago. Advised patient that I would ask [facility] to resend the report.   I called [facility] but the person handling the CPAP reports was at lunch. Will call back in an hour.

## 2018-05-19 DIAGNOSIS — M7741 Metatarsalgia, right foot: Secondary | ICD-10-CM | POA: Diagnosis not present

## 2018-05-19 DIAGNOSIS — R52 Pain, unspecified: Secondary | ICD-10-CM | POA: Diagnosis not present

## 2018-05-19 DIAGNOSIS — M5413 Radiculopathy, cervicothoracic region: Secondary | ICD-10-CM | POA: Diagnosis not present

## 2018-05-19 DIAGNOSIS — M7742 Metatarsalgia, left foot: Secondary | ICD-10-CM | POA: Diagnosis not present

## 2018-05-19 NOTE — Telephone Encounter (Signed)
Mindy Ingram and Dr. Annamaria Boots, please advise if you have received the report from [facility] on pt's CPAP. Thanks!

## 2018-05-19 NOTE — Telephone Encounter (Signed)
Called patient unable to reach left message to give us a call back.

## 2018-05-19 NOTE — Telephone Encounter (Signed)
No records found on patient.

## 2018-05-21 NOTE — Telephone Encounter (Signed)
Attempted to call pt but unable to reach her. Left message for pt to return call. 

## 2018-05-24 NOTE — Telephone Encounter (Signed)
Patient returned call, asks to call her cell [phone] around 12:00 or after today.

## 2018-05-24 NOTE — Telephone Encounter (Signed)
Attempted to contact pt. I did not receive an answer. I have left a message for pt to return our call.  

## 2018-05-24 NOTE — Telephone Encounter (Signed)
Called patient unable to reach left message to give us a call back.

## 2018-05-26 NOTE — Telephone Encounter (Signed)
Attempted to contact pt. I did not receive an answer. I have left a message for pt to return our call.  

## 2018-05-28 NOTE — Telephone Encounter (Signed)
Called and spoke with pt letting her know that we have checked and we have not seen any report/records from [facility] [address] on pt.  I advised pt to call [facility] letting them know this to see if there is anything that needs to be done by her again so we can be able to receive the report. Pt expressed understanding.  I did provide pt with our new phone number and fax number so she could giveto [facility] and also gave pt our new office address. Nothing further needed.

## 2018-06-04 DIAGNOSIS — G4733 Obstructive sleep apnea (adult) (pediatric): Secondary | ICD-10-CM | POA: Diagnosis not present

## 2018-06-18 ENCOUNTER — Ambulatory Visit
Admission: RE | Admit: 2018-06-18 | Discharge: 2018-06-18 | Disposition: A | Discharge status: Home / Self Care | Payer: PPO | Source: Ambulatory Visit | Attending: Internal Medicine | Admitting: Internal Medicine

## 2018-06-18 ENCOUNTER — Other Ambulatory Visit: Payer: Self-pay | Admitting: Internal Medicine

## 2018-06-18 DIAGNOSIS — M542 Cervicalgia: Secondary | ICD-10-CM

## 2018-06-18 DIAGNOSIS — E119 Type 2 diabetes mellitus without complications: Secondary | ICD-10-CM | POA: Diagnosis not present

## 2018-06-18 DIAGNOSIS — I10 Essential (primary) hypertension: Secondary | ICD-10-CM | POA: Diagnosis not present

## 2018-06-18 DIAGNOSIS — G5602 Carpal tunnel syndrome, left upper limb: Secondary | ICD-10-CM | POA: Diagnosis not present

## 2018-06-18 DIAGNOSIS — M5134 Other intervertebral disc degeneration, thoracic region: Secondary | ICD-10-CM | POA: Diagnosis not present

## 2018-06-18 DIAGNOSIS — M549 Dorsalgia, unspecified: Secondary | ICD-10-CM | POA: Diagnosis not present

## 2018-06-18 DIAGNOSIS — M546 Pain in thoracic spine: Secondary | ICD-10-CM

## 2018-06-18 DIAGNOSIS — L84 Corns and callosities: Secondary | ICD-10-CM | POA: Diagnosis not present

## 2018-06-18 DIAGNOSIS — J45909 Unspecified asthma, uncomplicated: Secondary | ICD-10-CM | POA: Diagnosis not present

## 2018-06-18 DIAGNOSIS — M47812 Spondylosis without myelopathy or radiculopathy, cervical region: Secondary | ICD-10-CM | POA: Diagnosis not present

## 2018-06-18 DIAGNOSIS — M7712 Lateral epicondylitis, left elbow: Secondary | ICD-10-CM | POA: Diagnosis not present

## 2018-06-18 DIAGNOSIS — E785 Hyperlipidemia, unspecified: Secondary | ICD-10-CM | POA: Diagnosis not present

## 2018-06-27 DIAGNOSIS — H9312 Tinnitus, left ear: Secondary | ICD-10-CM | POA: Diagnosis not present

## 2018-07-01 DIAGNOSIS — H9192 Unspecified hearing loss, left ear: Secondary | ICD-10-CM | POA: Insufficient documentation

## 2018-07-01 DIAGNOSIS — H6123 Impacted cerumen, bilateral: Secondary | ICD-10-CM

## 2018-07-01 DIAGNOSIS — H903 Sensorineural hearing loss, bilateral: Secondary | ICD-10-CM | POA: Diagnosis not present

## 2018-07-01 DIAGNOSIS — H9203 Otalgia, bilateral: Secondary | ICD-10-CM | POA: Diagnosis not present

## 2018-07-01 HISTORY — DX: Impacted cerumen, bilateral: H61.23

## 2018-07-01 HISTORY — DX: Unspecified hearing loss, left ear: H91.92

## 2018-07-26 DIAGNOSIS — J3089 Other allergic rhinitis: Secondary | ICD-10-CM | POA: Diagnosis not present

## 2018-08-19 ENCOUNTER — Encounter: Payer: Self-pay | Admitting: Cardiology

## 2018-08-19 ENCOUNTER — Ambulatory Visit (INDEPENDENT_AMBULATORY_CARE_PROVIDER_SITE_OTHER): Payer: PPO | Admitting: Cardiology

## 2018-08-19 ENCOUNTER — Ambulatory Visit: Payer: PPO | Admitting: Cardiology

## 2018-08-19 VITALS — BP 132/80 | HR 66 | Ht 59.0 in | Wt 151.0 lb

## 2018-08-19 DIAGNOSIS — E785 Hyperlipidemia, unspecified: Secondary | ICD-10-CM

## 2018-08-19 DIAGNOSIS — E088 Diabetes mellitus due to underlying condition with unspecified complications: Secondary | ICD-10-CM | POA: Insufficient documentation

## 2018-08-19 DIAGNOSIS — I10 Essential (primary) hypertension: Secondary | ICD-10-CM | POA: Diagnosis not present

## 2018-08-19 NOTE — Patient Instructions (Signed)
Medication Instructions:   Your physician recommends that you continue on your current medications as directed. Please refer to the Current Medication list given to you today.   If you need a refill on your cardiac medications before your next appointment, please call your pharmacy.   Lab work:  NONE  Testing/Procedures:  NONE  Follow-Up: At [facility], you and your health needs are our priority.  As part of our continuing mission to provide you with exceptional heart care, we have created designated Provider Care Teams.  These Care Teams include your primary Cardiologist (physician) and Advanced Practice Providers (APPs -  Physician Assistants and Nurse Practitioners) who all work together to provide you with the care you need, when you need it.  . You will need a follow up appointment in 9 months.  Please call our office 2 months in advance to schedule this appointment.

## 2018-08-19 NOTE — Progress Notes (Signed)
Cardiology Office Note:    Date:  08/19/2018   ID:  Mindy Ingram, DOB 08-12-1945, MRN [id number]  PCP:  Josetta Huddle, MD  Cardiologist:  Jenean Lindau, MD   Referring MD: Josetta Huddle, MD    ASSESSMENT:    1. Essential hypertension   2. Dyslipidemia   3. Diabetes mellitus due to underlying condition with unspecified complications ([place])    PLAN:    In order of problems listed above:  1. Primary prevention stressed with the patient.  Importance of compliance with diet and medication stressed and she vocalized understanding.  Her blood pressure is stable.  Diet was discussed for dyslipidemia and diabetes mellitus.  She has excellent effort tolerance.  I discussed the EKG findings today again with her she has T wave inversions in inferolateral leads.  She is asymptomatic. 2. Patient will be seen in follow-up appointment in 9 months or earlier if the patient has any concerns   Medication Adjustments/Labs and Tests Ordered: Current medicines are reviewed at length with the patient today.  Concerns regarding medicines are outlined above.  No orders of the defined types were placed in this encounter.  No orders of the defined types were placed in this encounter.    No chief complaint on file.    History of Present Illness:    Mindy Ingram is a 73 y.o. female.  She has past medical history of essential hypertension dyslipidemia diabetes mellitus and abnormal EKG.  She denies any problems at this time and takes care of activities of daily living.  No chest pain orthopnea or PND.  She is here for a routine visit.    Past Medical History:  Diagnosis Date  . Allergic rhinitis   . Asthma   . Bronchitis    none in years  . DJD (degenerative joint disease)   . Fibromyalgia   . GERD (gastroesophageal reflux disease)   . Hypertension   . OSA (obstructive sleep apnea)    cpap setting of 10  . Otitis media    both ears with impacted cerumem  . Rapid heartbeat    no  metoprolol lin pm  . Sleep apnea   . Type II or unspecified type diabetes mellitus without mention of complication, not stated as uncontrolled     Past Surgical History:  Procedure Laterality Date  . ABDOMINAL HYSTERECTOMY    . APPENDECTOMY    . BACK SURGERY  2013   lower back with plates and screws  . benign lump right axilla    . [place]   at [facility], slight limitation with turning neck  . CHOLECYSTECTOMY    . EUS N/A 07/21/2013   Procedure: UPPER ENDOSCOPIC ULTRASOUND (EUS) LINEAR;  Surgeon: Milus Banister, MD;  Location: [facility];  Service: Endoscopy;  Laterality: N/A;  . NEUROPLASTY / TRANSPOSITION MEDIAN NERVE AT [place]  . PARTIAL COLECTOMY  1990   benign adhesions  . right foot surgery  2000   right great toe with artificial bone inserted  . right knee arthroscopy    . tah and bso      Current Medications: Current Meds  Medication Sig  . albuterol (PROVENTIL HFA;VENTOLIN HFA) 108 (90 Base) MCG/ACT inhaler Inhale 2 puffs every 4-6 hours as needed  . aspirin EC 81 MG tablet Take 81 mg by mouth daily.  . cetirizine (ZYRTEC) 10 MG tablet Take 1 tablet by mouth daily.  . Cholecalciferol (VITAMIN D PO) Take  2,000 Units by mouth daily.   . cyclobenzaprine (FLEXERIL) 10 MG tablet Take 10 mg by mouth as needed for muscle spasms. As needed  . fluticasone (FLONASE) 50 MCG/ACT nasal spray 1-2 puffs each nostril once daily  . irbesartan-hydrochlorothiazide (AVALIDE) 150-12.5 MG tablet Take 1 tablet by mouth daily.   . meloxicam (MOBIC) 7.5 MG tablet Take 7.5 mg by mouth 2 (two) times a week.   . metFORMIN (GLUCOPHAGE-XR) 500 MG 24 hr tablet Take 1,000 mg by mouth daily.  . metoprolol (TOPROL-XL) 200 MG 24 hr tablet Take 200 mg by mouth daily.  . Multiple Vitamins-Minerals (COMPLETE ENERGY) TABS Take 1 tablet by mouth daily.   . nitroGLYCERIN (NITROSTAT) 0.4 MG SL tablet Place 0.4 mg under the tongue every 5 (five) minutes as needed for  chest pain.  Marland Kitchen omeprazole (PRILOSEC) 20 MG capsule Take 1 capsule by mouth daily.  . rosuvastatin (CRESTOR) 10 MG tablet Take 10 mg by mouth 2 (two) times a week.  . traMADol (ULTRAM) 50 MG tablet Take 1.5 tablets (75 mg total) by mouth every 6 (six) hours as needed for severe pain (or cough). pain  . triamcinolone (KENALOG) 0.025 % ointment Apply 1 application topically 2 (two) times daily.     Allergies:   Dairy aid [lactase]; Lactose; Anoro ellipta [umeclidinium-vilanterol]; Compazine [prochlorperazine edisylate]; Hydrocodone-acetaminophen; Hydrocodone-acetaminophen; Lipitor [atorvastatin]; Myrbetriq [mirabegron]; Oxycontin [oxycodone hcl]; Prochlorperazine edisylate; Rosuvastatin; Shellfish allergy; and Percodan [oxycodone-aspirin]   Social History   Socioeconomic History  . Marital status: Widowed    Spouse name: Not on file  . Number of children: 1  . Years of education: Not on file  . Highest education level: Not on file  Occupational History  . Not on file  Social Needs  . Financial resource strain: Not on file  . Food insecurity:    Worry: Not on file    Inability: Not on file  . Transportation needs:    Medical: Not on file    Non-medical: Not on file  Tobacco Use  . Smoking status: Never Smoker  . Smokeless tobacco: Never Used  Substance and Sexual Activity  . Alcohol use: No  . Drug use: No  . Sexual activity: Not on file  Lifestyle  . Physical activity:    Days per week: Not on file    Minutes per session: Not on file  . Stress: Not on file  Relationships  . Social connections:    Talks on phone: Not on file    Gets together: Not on file    Attends religious service: Not on file    Active member of club or organization: Not on file    Attends meetings of clubs or organizations: Not on file    Relationship status: Not on file  Other Topics Concern  . Not on file  Social History Narrative  . Not on file     Family History: The patient's family history  includes Breast cancer in her maternal aunt. There is no history of Allergies, Asthma, Eczema, or Immunodeficiency.  ROS:   Please see the history of present illness.    All other systems reviewed and are negative.  EKGs/Labs/Other Studies Reviewed:    The following studies were reviewed today: EKG reveals sinus rhythm and T wave inversions in inferolateral leads.   Recent Labs: No results found for requested labs within last 8760 hours.  Recent Lipid Panel No results found for: CHOL, TRIG, HDL, CHOLHDL, VLDL, LDLCALC, LDLDIRECT  Physical Exam:  VS:  BP 132/80 (BP Location: Right Arm, Patient Position: Sitting, Cuff Size: Normal)   Pulse 66   Ht 4\' 11"  (1.499 m)   Wt 151 lb (68.5 kg)   SpO2 98%   BMI 30.50 kg/m     Wt Readings from Last 3 Encounters:  08/19/18 151 lb (68.5 kg)  03/15/18 145 lb 12.8 oz (66.1 kg)  06/02/17 153 lb 6.4 oz (69.6 kg)     GEN: Patient is in no acute distress HEENT: Normal NECK: No JVD; No carotid bruits LYMPHATICS: No lymphadenopathy CARDIAC: Hear sounds regular, 2/6 systolic murmur at the apex. RESPIRATORY:  Clear to auscultation without rales, wheezing or rhonchi  ABDOMEN: Soft, non-tender, non-distended MUSCULOSKELETAL:  No edema; No deformity  SKIN: Warm and dry NEUROLOGIC:  Alert and oriented x 3 PSYCHIATRIC:  Normal affect   Signed, Jenean Lindau, MD  08/19/2018 12:16 PM    [facility]

## 2018-08-23 ENCOUNTER — Telehealth: Payer: Self-pay | Admitting: Cardiology

## 2018-08-23 NOTE — Telephone Encounter (Signed)
Called patient to discuss after visit summary task complete

## 2018-08-23 NOTE — Telephone Encounter (Signed)
Has questions about her after visit summary

## 2018-08-24 ENCOUNTER — Telehealth: Payer: Self-pay | Admitting: Internal Medicine

## 2018-08-24 NOTE — Telephone Encounter (Signed)
Left message for patient to call back  

## 2018-08-25 NOTE — Telephone Encounter (Signed)
Called and spoke with patient, she stated that she has been trying to get [place] [facility] to fax over her compliance report for [place]. Per SG she wanted to see if the patients current pressure was working well for her. I do not see where we have received this. I contacted [place] [facility] and spoke with Judson Roch, she stated that the patient will have to bring her machine in for them to get a download printed. Called and spoke with patient advised her that per Judson Roch and Hollister from [facility], she will need to bring her machine in for a current download. Patient stated that she will take it and have them send the download to [place]. Verified correct fax number. Will route this over to Katie to follow up on since this is a CY patient.

## 2018-08-31 NOTE — Telephone Encounter (Signed)
Raquel Sarna Have you seen this download report from [facility] yet for CY?

## 2018-09-01 NOTE — Telephone Encounter (Signed)
Patient is returning phone call.  Patient phone number is [phone].

## 2018-09-01 NOTE — Telephone Encounter (Signed)
Will route this over to CY as Raquel Sarna has not worked in [employer] yet per [facility]. Dr. Annamaria Boots please advise if you have received the download for this patient. If not we can contact both the patient and [facility] as well. Thank you.

## 2018-09-01 NOTE — Telephone Encounter (Signed)
I don't think we have gotten any recent download on her.

## 2018-09-01 NOTE — Telephone Encounter (Signed)
Spoke with pt. She is aware that we have not received anything on her CPAP. States that she is going to take her machine back to [place] [facility] and have them do the download again.

## 2018-09-01 NOTE — Telephone Encounter (Signed)
Attempted to call pt but unable to reach. Left message for pt to return call. 

## 2018-09-10 NOTE — Telephone Encounter (Signed)
Call made to [place] [facility], made aware patient has not yet brought in her cpap machine for a download. Left message for a update regarding this with patient.

## 2018-09-12 ENCOUNTER — Encounter: Payer: Self-pay | Admitting: Internal Medicine

## 2018-09-13 ENCOUNTER — Ambulatory Visit (INDEPENDENT_AMBULATORY_CARE_PROVIDER_SITE_OTHER): Payer: PPO | Admitting: Internal Medicine

## 2018-09-13 ENCOUNTER — Other Ambulatory Visit: Payer: Self-pay

## 2018-09-13 ENCOUNTER — Encounter: Payer: Self-pay | Admitting: Internal Medicine

## 2018-09-13 DIAGNOSIS — G4733 Obstructive sleep apnea (adult) (pediatric): Secondary | ICD-10-CM

## 2018-09-13 DIAGNOSIS — J4520 Mild intermittent asthma, uncomplicated: Secondary | ICD-10-CM

## 2018-09-13 MED ORDER — ALBUTEROL SULFATE HFA 108 (90 BASE) MCG/ACT IN AERS: INHALATION_SPRAY | RESPIRATORY_TRACT | 12 refills | Status: DC

## 2018-09-13 NOTE — Patient Instructions (Signed)
We can continue CPAP auto 5-15, mask of choice, humidifier, supplies, AirView  Refill printed for albuterol rescue inhaler    Inhale 2 puffs every 6 hours if needed for wheezing, shortness of breath  Please call if we can elp

## 2018-09-13 NOTE — Progress Notes (Signed)
Subjective:    Patient ID: Mindy Ingram, female    DOB: [birth year], 73 y.o.   MRN: [id number]  HPI F never smoker followed for obstructive sleep apnea, allergic rhinitis complicated by DM, bronchitis, GERD. Office Spirometry 04/03/2015- WNL FVC 2.14/110%, FEV1 1.94/128%, FEV1/FVC 0.91 . NPSG 11/12/10- AHI  17.9/ hr, desat to 76% titrated to CPAP 12, body weight 165 lbs Office spirometry- 11/04/16--by allergy office- FEV1/FVC 0.77 WNL -------------------------------------------------------------------------------.  06/02/17- 74 year old female never smoker followed for OSA, complicated by allergic rhinitis, asthmatic bronchitis, DM2, GERD,  CPAP 10/[facility] ----OSA; DME:[facility]-pt would like to change DME's at first of year with new insurance. DL attached. Pt wears CPAP nightly. Office spirometry- 11/04/16--by allergy office- FEV1/FVC 0.77 WNL CPAP download 92% compliance, AHI 3.4/hour.  She is comfortable with the pressure.  Feels better and sleeps better with CPAP. Bothered by nasal stuffiness with some seasonal variation.  We discussed Flonase and she continues saline nasal rinse.  09/13/2018- 73 year old female never smoker followed for OSA, complicated by allergic rhinitis, asthmatic bronchitis, DM2, GERD,  CPAP auto 5-15/ [facility] ([facility]) Download 100% compliance, AHI 2.8/ hr -----OSA on CPAP; uses every night, no issues w/ pressure settings or mask; no new or worsening breathing symptoms Body weight today 153 lbs Asthma is doing well- rare need for rescue inhaler.  Review of Systems- see HPI + = positive Constitutional:   No-   weight loss, night sweats, fevers, chills, fatigue, lassitude. HEENT:   No-  headaches, difficulty swallowing, tooth/dental problems, sore throat,       No-  sneezing, itching, ear ache, +nasal congestion,+post nasal drip,  CV:  No-   chest pain, orthopnea, PND, swelling in lower extremities, anasarca, dizziness, palpitations Resp: No-   shortness of breath with  exertion or at rest.              No-   productive cough,  No non-productive cough,  No-  coughing up of blood.              No-   change in color of mucus.  No- wheezing.   Skin:  GI:  No-   heartburn, indigestion, abdominal pain, nausea, vomiting,  GU:  MS:  No-   joint pain or swelling.   Neuro- nothing unusual  Psych:  No- change in mood or affect. No depression or anxiety.  No memory loss.    Objective:   Physical Exam    Mask Covid precaution General- Alert, Oriented, Affect-appropriate, Distress- none acute, + overweight Skin- +Raised nodules right nasolabial fold without erythema or excoriation Lymphadenopathy- none Head- atraumatic            Eyes- Gross vision intact, PERRLA, conjunctivae clear secretions            Ears-hearing normal            Nose- +turbinate edema, No-Septal dev, mucus+, No-polyps, erosion, perforation             Throat- Mallampati II , mucosa clear , drainage- none, tonsils- atrophic Neck- flexible , trachea midline, no stridor , thyroid nl, carotid no bruit Chest - symmetrical excursion , unlabored           Heart/CV- RRR , no murmur , no gallop  , no rub, nl s1 s2                           - JVD- none , edema- none, stasis changes- none, varices- none  Lung- clear to P&A, wheeze-none, unlabored, cough-none, dullness-none, rub- none           Chest wall-  Abd- Br/ Gen/ Rectal- Not done, not indicated Extrem- cyanosis- none, clubbing, none, atrophy- none, strength- nl. Cane. Neuro- grossly intact to observation Assessment & Plan:

## 2018-09-14 DIAGNOSIS — G4733 Obstructive sleep apnea (adult) (pediatric): Secondary | ICD-10-CM | POA: Diagnosis not present

## 2018-09-20 NOTE — Assessment & Plan Note (Signed)
She continues to benefit from CPAP with improved sleep. Plan- Continue CPAP auto 5-15

## 2018-09-20 NOTE — Assessment & Plan Note (Signed)
No recent exacerbation. She will keep rescue inhaler available as discussed.

## 2018-10-15 DIAGNOSIS — G4733 Obstructive sleep apnea (adult) (pediatric): Secondary | ICD-10-CM | POA: Diagnosis not present

## 2018-11-16 ENCOUNTER — Other Ambulatory Visit: Payer: PPO

## 2018-11-25 ENCOUNTER — Telehealth: Payer: Self-pay | Admitting: Internal Medicine

## 2018-11-25 NOTE — Telephone Encounter (Signed)
I understand the patient has concerns regarding inhaler use.  If the patient is not currently using her albuterol inhaler there still is not a need for maintenance inhalers.  If she would like to discuss maintenance inhalers further she can be scheduled with an office visit with either Dr. Annamaria Boots or a visit with an APP.  If we were going to transition the patient to a maintenance inhaler with asthma is the main diagnosis and I would favor more an inhaled corticosteroid inhaler or LABA/ICS as needed. This really should be discussed more though in an office visit setting or we can weigh out the pros and cons of doing this.   Wyn Quaker FNP

## 2018-11-25 NOTE — Telephone Encounter (Signed)
Called and spoke with pt who is wanting to know if she can be placed on either Spiriva or Combivent Respimat. Pt has albuterol prescribed as rescue inhaler and stated that she has not had to use that and is wanting to have an inhaler prescribed for a maintenance inhaler to help prevent her from having any flares with her asthma.  Pt stated when she was at the office to see CY 09/13/2018, she had a good visit and has not had any complaints of wheezing or SOB.  Instructions from that [place] are shown below: Patient Instructions by Deneise Lever, MD at 09/13/2018 11:00 AM  Author: Deneise Lever, MD Author Type: Physician Filed: 09/13/2018 11:31 AM  Note Status: Signed Cosign: Cosign Not Required Encounter Date: 09/13/2018  Editor: Deneise Lever, MD (Physician)    We can continue CPAP auto 5-15, mask of choice, humidifier, supplies, AirView  Refill printed for albuterol rescue inhaler    Inhale 2 puffs every 6 hours if needed for wheezing, shortness of breath  Please call if we can elp    Instructions Return in about 1 year (around 09/13/2019).    Since Aaron Edelman was APP of the day today, 11/25/2018 routing this to him for review as Judson Roch is not going to be in the office 11/26/2018. Pt has been made aware that it will be 5/29 before we will call her back with a response and she was fine with that.  Aaron Edelman, please advise on this for pt. Thanks!

## 2018-11-26 NOTE — Telephone Encounter (Signed)
Called & spoke w/ pt regarding BPM's recommendations. Pt verbalized understanding. Pt states she is doing well and has no new or worsening breathing symptoms since LOV w/ CY 09/13/2018. She also states that she hasn't had to use her albuterol rescue inhaler.   Pt's main question was if she could use Combivent respimat as an alternative to her albuterol rescue inhaler if she ever ran out of albuterol. I informed pt that combivent respimat is a maintenance inhaler and that according to CY's most recent recommendations from [place] 09/13/2018, she is only to take albuterol q6h PRN for wheezing & SOB. Pt verbalized understanding.   I offered pt an OV w/ CY when he returns to the office 12/01/2018. Pt stated she does not need an OV at this time. I let pt know she has 12 refills of her albuterol rescue inhaler, therefore, she should not run out but if she has any problems attaining this medication to give our office a call. I also let her know to give our office a call should she have any new or worsening breathing symptoms. Pt expressed understanding with no additional questions. Nothing further needed at this time.

## 2018-12-01 ENCOUNTER — Ambulatory Visit
Admission: RE | Admit: 2018-12-01 | Discharge: 2018-12-01 | Disposition: A | Discharge status: Home / Self Care | Payer: PPO | Source: Ambulatory Visit | Attending: Internal Medicine | Admitting: Internal Medicine

## 2018-12-01 ENCOUNTER — Other Ambulatory Visit: Payer: Self-pay | Admitting: Internal Medicine

## 2018-12-01 ENCOUNTER — Other Ambulatory Visit: Payer: Self-pay

## 2018-12-01 DIAGNOSIS — N6321 Unspecified lump in the left breast, upper outer quadrant: Secondary | ICD-10-CM | POA: Diagnosis not present

## 2018-12-01 DIAGNOSIS — N6320 Unspecified lump in the left breast, unspecified quadrant: Secondary | ICD-10-CM

## 2018-12-13 NOTE — Progress Notes (Signed)
Virtual Visit via Telephone Note  I connected with Mindy Ingram on 12/13/18 at 11:30 AM EDT by telephone and verified that I am speaking with the correct person using two identifiers.  Location: Patient: Home Provider: Office   I discussed the limitations, risks, security and privacy concerns of performing an evaluation and management service by telephone and the availability of in person appointments. I also discussed with the patient that there may be a patient responsible charge related to this service. The patient expressed understanding and agreed to proceed.   History of Present Illness: 73 year old female, never smoked. PMH asthmatic bronchitis, OSA, seasonal and perennial allergic rhinitis. Patient of Dr. Annamaria Boots, last seen on 09/13/18. NPSG 11/12/10- AHI  17.9/ hr, desat to 76% titrated to CPAP 12. CPAP auto 5-15/ [facility] ([facility]).  12/14/2018 Patient called today for new CPAP machine. Reports that the motor on her machine states that it is over extended. She has had this same CPAP machine for 7-8 years through [facility] patient. She would like to stay with [facility] for her CPAP machine even though it is out of network. She is 98% compliant with use, averages 8-10 hours each night. Using full face mask. DME company for CPAP supplies is [facility].    Observations/Objective:  - No shortness of breath, wheezing or cough  Assessment and Plan:  OSA - Compliant with CPAP and reports benefit from use - Referral to [facility] for new CPAP machine (out of network) - Gets CPAP supplies through [facility]  Follow Up Instructions:   - FU in March 2021 with Dr. Annamaria Boots   I discussed the assessment and treatment plan with the patient. The patient was provided an opportunity to ask questions and all were answered. The patient agreed with the plan and demonstrated an understanding of the instructions.   The patient was advised to call back or seek an in-person  evaluation if the symptoms worsen or if the condition fails to improve as anticipated.  I provided 20 minutes of non-face-to-face time during this encounter.   Martyn Ehrich, NP

## 2018-12-14 ENCOUNTER — Other Ambulatory Visit: Payer: Self-pay

## 2018-12-14 ENCOUNTER — Encounter: Payer: Self-pay | Admitting: Primary Care

## 2018-12-14 ENCOUNTER — Ambulatory Visit (INDEPENDENT_AMBULATORY_CARE_PROVIDER_SITE_OTHER): Payer: PPO | Admitting: Primary Care

## 2018-12-14 DIAGNOSIS — G4733 Obstructive sleep apnea (adult) (pediatric): Secondary | ICD-10-CM | POA: Diagnosis not present

## 2018-12-14 NOTE — Addendum Note (Signed)
Addended by: Karmen Stabs on: 12/14/2018 12:03 PM   Modules accepted: Orders

## 2018-12-14 NOTE — Addendum Note (Signed)
Addended by: Karmen Stabs on: 12/14/2018 12:09 PM   Modules accepted: Orders

## 2018-12-14 NOTE — Patient Instructions (Addendum)
  New CPAP machine order to [facility]  Tia Alert) [phone]  Order for CPAP supplies with [facility]   Pressure 5-15cm H20  Follow up with Dr. Annamaria Boots as recommended March 2021

## 2018-12-23 DIAGNOSIS — G4733 Obstructive sleep apnea (adult) (pediatric): Secondary | ICD-10-CM | POA: Diagnosis not present

## 2019-01-22 DIAGNOSIS — G4733 Obstructive sleep apnea (adult) (pediatric): Secondary | ICD-10-CM | POA: Diagnosis not present

## 2019-01-25 ENCOUNTER — Encounter: Payer: Self-pay | Admitting: Primary Care

## 2019-01-25 ENCOUNTER — Other Ambulatory Visit: Payer: Self-pay

## 2019-01-25 ENCOUNTER — Ambulatory Visit (INDEPENDENT_AMBULATORY_CARE_PROVIDER_SITE_OTHER): Payer: PPO | Admitting: Primary Care

## 2019-01-25 DIAGNOSIS — G4733 Obstructive sleep apnea (adult) (pediatric): Secondary | ICD-10-CM | POA: Diagnosis not present

## 2019-01-25 NOTE — Progress Notes (Signed)
Virtual Visit via Telephone Note  I connected with [name] on 01/25/19 at 11:00 AM EDT by telephone and verified that I am speaking with the correct person using two identifiers.  Location: Patient: Home Provider: Office   I discussed the limitations, risks, security and privacy concerns of performing an evaluation and management service by telephone and the availability of in person appointments. I also discussed with the patient that there may be a patient responsible charge related to this service. The patient expressed understanding and agreed to proceed.  History of Present Illness: 73 year old female, never smoked. PMH asthmatic bronchitis, OSA, seasonal and perennial allergic rhinitis. Patient of Dr. Annamaria Boots. NPSG 11/12/10- AHI  17.9/ hr, desat to 76% titrated to CPAP 12. CPAP auto 5-15/ [facility] ([facility]).  Previous [place] pulmonary encounter:  12/14/2018 Patient called today for new CPAP machine. Reports that the motor on her machine states that it is over extended. She has had this same CPAP machine for 7-8 years through [facility]. She would like to stay with [facility] for her CPAP machine even though it is out of network. She is 98% compliant with use, averages 8-10 hours each night. Using full face mask. DME company for CPAP supplies is [facility].  Sent new order CPAP machine to [place] [facility] patient and supplies to [place].   01/25/2019 Patient contacted today for CPAP follow-up. Feels well, no acute complaints. She received new machine last month. Reports that everything has been fine. No issues with mask or pressure setting.  Sleeping well at night. Download shows 100% compliance with CPAP. Nothing further needed.   Airview download: 30/30 days; 100% >4 hours Average usage 7 hours 53 mins Pressure 5-15cm h20 (14.5) Leaks 11.1L/min AHI 3.6  Observations/Objective:  - Good spirits - No shortness of breath, wheezing or cough noted  during phone conversation   Assessment and Plan:  OSA - Received new CPAP machine which is working well - 100% compliant with use and reports benefit  - Pressure 5-15cm h20 (95% is 14.5); AHI 3.6 - Plan change pressure setting to 7-17cm h20  - Download in 4 weeks  Follow Up Instructions:   - FU in March 2021 with Dr. Annamaria Boots  I discussed the assessment and treatment plan with the patient. The patient was provided an opportunity to ask questions and all were answered. The patient agreed with the plan and demonstrated an understanding of the instructions.   The patient was advised to call back or seek an in-person evaluation if the symptoms worsen or if the condition fails to improve as anticipated.  I provided 15 minutes of non-face-to-face time during this encounter.   Martyn Ehrich, NP

## 2019-01-25 NOTE — Patient Instructions (Addendum)
Nice speaking with you today, glad you are doing well with new CPAP machine  Change pressure 7-17cm h20  Airview download in 4 weeks    Due to see Dr. Annamaria Boots in March 2021 or sooner if needed

## 2019-01-27 DIAGNOSIS — G4733 Obstructive sleep apnea (adult) (pediatric): Secondary | ICD-10-CM | POA: Diagnosis not present

## 2019-02-22 DIAGNOSIS — G4733 Obstructive sleep apnea (adult) (pediatric): Secondary | ICD-10-CM | POA: Diagnosis not present

## 2019-03-16 DIAGNOSIS — E119 Type 2 diabetes mellitus without complications: Secondary | ICD-10-CM | POA: Diagnosis not present

## 2019-03-16 DIAGNOSIS — H43811 Vitreous degeneration, right eye: Secondary | ICD-10-CM | POA: Diagnosis not present

## 2019-03-16 DIAGNOSIS — E559 Vitamin D deficiency, unspecified: Secondary | ICD-10-CM | POA: Diagnosis not present

## 2019-03-16 DIAGNOSIS — Z23 Encounter for immunization: Secondary | ICD-10-CM | POA: Diagnosis not present

## 2019-03-16 DIAGNOSIS — E785 Hyperlipidemia, unspecified: Secondary | ICD-10-CM | POA: Diagnosis not present

## 2019-03-16 DIAGNOSIS — H2511 Age-related nuclear cataract, right eye: Secondary | ICD-10-CM | POA: Diagnosis not present

## 2019-03-16 DIAGNOSIS — H43812 Vitreous degeneration, left eye: Secondary | ICD-10-CM | POA: Diagnosis not present

## 2019-03-16 DIAGNOSIS — Z961 Presence of intraocular lens: Secondary | ICD-10-CM | POA: Diagnosis not present

## 2019-03-16 DIAGNOSIS — I10 Essential (primary) hypertension: Secondary | ICD-10-CM | POA: Diagnosis not present

## 2019-03-25 DIAGNOSIS — G4733 Obstructive sleep apnea (adult) (pediatric): Secondary | ICD-10-CM | POA: Diagnosis not present

## 2019-04-04 DIAGNOSIS — M549 Dorsalgia, unspecified: Secondary | ICD-10-CM | POA: Diagnosis not present

## 2019-04-04 DIAGNOSIS — E559 Vitamin D deficiency, unspecified: Secondary | ICD-10-CM | POA: Diagnosis not present

## 2019-04-04 DIAGNOSIS — M2550 Pain in unspecified joint: Secondary | ICD-10-CM | POA: Diagnosis not present

## 2019-04-04 DIAGNOSIS — Z79899 Other long term (current) drug therapy: Secondary | ICD-10-CM | POA: Diagnosis not present

## 2019-04-04 DIAGNOSIS — I10 Essential (primary) hypertension: Secondary | ICD-10-CM | POA: Diagnosis not present

## 2019-04-04 DIAGNOSIS — E785 Hyperlipidemia, unspecified: Secondary | ICD-10-CM | POA: Diagnosis not present

## 2019-04-04 DIAGNOSIS — E119 Type 2 diabetes mellitus without complications: Secondary | ICD-10-CM | POA: Diagnosis not present

## 2019-04-24 DIAGNOSIS — G4733 Obstructive sleep apnea (adult) (pediatric): Secondary | ICD-10-CM | POA: Diagnosis not present

## 2019-05-10 ENCOUNTER — Ambulatory Visit (INDEPENDENT_AMBULATORY_CARE_PROVIDER_SITE_OTHER): Payer: PPO | Admitting: Cardiology

## 2019-05-10 ENCOUNTER — Encounter: Payer: Self-pay | Admitting: Cardiology

## 2019-05-10 ENCOUNTER — Other Ambulatory Visit: Payer: Self-pay

## 2019-05-10 VITALS — BP 142/82 | HR 77 | Ht 71.5 in | Wt 146.2 lb

## 2019-05-10 DIAGNOSIS — I10 Essential (primary) hypertension: Secondary | ICD-10-CM | POA: Diagnosis not present

## 2019-05-10 DIAGNOSIS — G4733 Obstructive sleep apnea (adult) (pediatric): Secondary | ICD-10-CM

## 2019-05-10 DIAGNOSIS — E785 Hyperlipidemia, unspecified: Secondary | ICD-10-CM | POA: Diagnosis not present

## 2019-05-10 MED ORDER — NITROGLYCERIN 0.4 MG SL SUBL: 0.4000 mg | SUBLINGUAL_TABLET | SUBLINGUAL | 4 refills | Status: DC | PRN

## 2019-05-10 NOTE — Progress Notes (Signed)
Cardiology Office Note:    Date:  05/10/2019   ID:  Mindy Ingram, DOB 1946-04-20, MRN [id number]  PCP:  Josetta Huddle, MD  Cardiologist:  Jenean Lindau, MD   Referring MD: Josetta Huddle, MD    ASSESSMENT:    1. Essential hypertension   2. Dyslipidemia   3. Obstructive sleep apnea    PLAN:    In order of problems listed above:  1. Primary prevention stressed with the patient.  Importance of compliance with diet and medication stressed and she vocalized understanding.  Her blood pressure is stable. 2. Essential hypertension: Blood pressure is stable and diet was emphasized 3. Mixed dyslipidemia: Diet was emphasized and the importance of regular exercise stressed 30 minutes a day at least 5 days a week and she promises to do so. 4. Patient will be seen in follow-up appointment in 6 months or earlier if the patient has any concerns    Medication Adjustments/Labs and Tests Ordered: Current medicines are reviewed at length with the patient today.  Concerns regarding medicines are outlined above.  No orders of the defined types were placed in this encounter.  No orders of the defined types were placed in this encounter.    No chief complaint on file.    History of Present Illness:    Mindy Ingram is a 73 y.o. female.  Patient denies any problems at this time and takes care of activities of daily living.  No chest pain orthopnea or PND.  She has essential hypertension and dyslipidemia.  At the time of my evaluation, the patient is alert awake oriented and in no distress.  She walks on a regular basis.  Past Medical History:  Diagnosis Date  . Allergic rhinitis   . Asthma   . Bronchitis    none in years  . DJD (degenerative joint disease)   . Fibromyalgia   . GERD (gastroesophageal reflux disease)   . Hypertension   . OSA (obstructive sleep apnea)    cpap setting of 10  . Otitis media    both ears with impacted cerumem  . Rapid heartbeat    no  metoprolol lin pm  . Sleep apnea   . Type II or unspecified type diabetes mellitus without mention of complication, not stated as uncontrolled     Past Surgical History:  Procedure Laterality Date  . ABDOMINAL HYSTERECTOMY    . APPENDECTOMY    . BACK SURGERY  2013   lower back with plates and screws  . benign lump right axilla    . [place]   at [facility], slight limitation with turning neck  . CHOLECYSTECTOMY    . EUS N/A 07/21/2013   Procedure: UPPER ENDOSCOPIC ULTRASOUND (EUS) LINEAR;  Surgeon: Milus Banister, MD;  Location: [facility];  Service: Endoscopy;  Laterality: N/A;  . NEUROPLASTY / TRANSPOSITION MEDIAN NERVE AT [place]  . PARTIAL COLECTOMY  1990   benign adhesions  . right foot surgery  2000   right great toe with artificial bone inserted  . right knee arthroscopy    . tah and bso      Current Medications: Current Meds  Medication Sig  . albuterol (PROVENTIL HFA;VENTOLIN HFA) 108 (90 Base) MCG/ACT inhaler Inhale 2 puffs every 4-6 hours as needed  . amLODipine (NORVASC) 2.5 MG tablet Take 2.5 mg by mouth daily.  Marland Kitchen aspirin EC 81 MG tablet Take 81 mg by mouth daily.  . cetirizine (ZYRTEC)  10 MG tablet Take 1 tablet by mouth daily.  . Cholecalciferol (VITAMIN D PO) Take 2,000 Units by mouth daily.   . cyclobenzaprine (FLEXERIL) 10 MG tablet Take 10 mg by mouth as needed for muscle spasms. As needed  . Elderberry 575 MG/5ML SYRP Take 5 mLs by mouth daily.  . fluticasone (FLONASE) 50 MCG/ACT nasal spray 1-2 puffs each nostril once daily  . irbesartan-hydrochlorothiazide (AVALIDE) 150-12.5 MG tablet Take 1 tablet by mouth daily.   . meloxicam (MOBIC) 7.5 MG tablet Take 7.5 mg by mouth 2 (two) times a week.   . metFORMIN (GLUCOPHAGE-XR) 500 MG 24 hr tablet Take 1,000 mg by mouth daily.  . metoprolol (TOPROL-XL) 200 MG 24 hr tablet Take 200 mg by mouth daily.  . Multiple Vitamins-Minerals (COMPLETE ENERGY) TABS Take 1 tablet by  mouth daily.   . nitroGLYCERIN (NITROSTAT) 0.4 MG SL tablet Place 0.4 mg under the tongue every 5 (five) minutes as needed for chest pain.  . Omega-3 Fatty Acids (FISH OIL) 1000 MG CAPS Take 1 capsule by mouth daily.  Marland Kitchen omeprazole (PRILOSEC) 20 MG capsule Take 1 capsule by mouth daily.  . rosuvastatin (CRESTOR) 10 MG tablet Take 10 mg by mouth 2 (two) times a week.  . triamcinolone (KENALOG) 0.025 % ointment Apply 1 application topically 2 (two) times daily.     Allergies:   Dairy aid [lactase], Lactose, Anoro ellipta [umeclidinium-vilanterol], Compazine [prochlorperazine edisylate], Hydrocodone-acetaminophen, Hydrocodone-acetaminophen, Lipitor [atorvastatin], Myrbetriq [mirabegron], Oxycontin [oxycodone hcl], Prochlorperazine edisylate, Rosuvastatin, Shellfish allergy, and Percodan [oxycodone-aspirin]   Social History   Socioeconomic History  . Marital status: Widowed    Spouse name: Not on file  . Number of children: 1  . Years of education: Not on file  . Highest education level: Not on file  Occupational History  . Not on file  Social Needs  . Financial resource strain: Not on file  . Food insecurity    Worry: Not on file    Inability: Not on file  . Transportation needs    Medical: Not on file    Non-medical: Not on file  Tobacco Use  . Smoking status: Never Smoker  . Smokeless tobacco: Never Used  Substance and Sexual Activity  . Alcohol use: No  . Drug use: No  . Sexual activity: Not on file  Lifestyle  . Physical activity    Days per week: Not on file    Minutes per session: Not on file  . Stress: Not on file  Relationships  . Social Herbalist on phone: Not on file    Gets together: Not on file    Attends religious service: Not on file    Active member of club or organization: Not on file    Attends meetings of clubs or organizations: Not on file    Relationship status: Not on file  Other Topics Concern  . Not on file  Social History Narrative  .  Not on file     Family History: The patient's family history includes Breast cancer in her maternal aunt. There is no history of Allergies, Asthma, Eczema, or Immunodeficiency.  ROS:   Please see the history of present illness.    All other systems reviewed and are negative.  EKGs/Labs/Other Studies Reviewed:    The following studies were reviewed today: I discussed findings from previous evaluation   Recent Labs: No results found for requested labs within last 8760 hours.  Recent Lipid Panel No results found for: CHOL,  TRIG, HDL, CHOLHDL, VLDL, LDLCALC, LDLDIRECT  Physical Exam:    VS:  BP (!) 142/82 (BP Location: Left Arm, Patient Position: Sitting, Cuff Size: Normal)   Pulse 77   Ht 5' 11.5" (1.816 m)   Wt 146 lb 3.2 oz (66.3 kg)   SpO2 98%   BMI 20.11 kg/m     Wt Readings from Last 3 Encounters:  05/10/19 146 lb 3.2 oz (66.3 kg)  09/13/18 153 lb 3.2 oz (69.5 kg)  08/19/18 151 lb (68.5 kg)     GEN: Patient is in no acute distress HEENT: Normal NECK: No JVD; No carotid bruits LYMPHATICS: No lymphadenopathy CARDIAC: Hear sounds regular, 2/6 systolic murmur at the apex. RESPIRATORY:  Clear to auscultation without rales, wheezing or rhonchi  ABDOMEN: Soft, non-tender, non-distended MUSCULOSKELETAL:  No edema; No deformity  SKIN: Warm and dry NEUROLOGIC:  Alert and oriented x 3 PSYCHIATRIC:  Normal affect   Signed, Jenean Lindau, MD  05/10/2019 11:20 AM    [employer]

## 2019-05-10 NOTE — Addendum Note (Signed)
Addended by: Beckey Rutter on: 05/10/2019 11:50 AM   Modules accepted: Orders

## 2019-05-10 NOTE — Addendum Note (Signed)
Addended by: Beckey Rutter on: 05/10/2019 11:46 AM   Modules accepted: Orders

## 2019-05-10 NOTE — Addendum Note (Signed)
Addended by: Beckey Rutter on: 05/10/2019 11:43 AM   Modules accepted: Orders

## 2019-05-10 NOTE — Patient Instructions (Signed)

## 2019-05-12 DIAGNOSIS — M2550 Pain in unspecified joint: Secondary | ICD-10-CM | POA: Diagnosis not present

## 2019-05-12 DIAGNOSIS — I10 Essential (primary) hypertension: Secondary | ICD-10-CM | POA: Diagnosis not present

## 2019-05-12 DIAGNOSIS — E119 Type 2 diabetes mellitus without complications: Secondary | ICD-10-CM | POA: Diagnosis not present

## 2019-05-12 DIAGNOSIS — Z79899 Other long term (current) drug therapy: Secondary | ICD-10-CM | POA: Diagnosis not present

## 2019-05-12 DIAGNOSIS — E559 Vitamin D deficiency, unspecified: Secondary | ICD-10-CM | POA: Diagnosis not present

## 2019-05-12 DIAGNOSIS — K219 Gastro-esophageal reflux disease without esophagitis: Secondary | ICD-10-CM | POA: Diagnosis not present

## 2019-05-12 DIAGNOSIS — Z0001 Encounter for general adult medical examination with abnormal findings: Secondary | ICD-10-CM | POA: Diagnosis not present

## 2019-05-12 DIAGNOSIS — E785 Hyperlipidemia, unspecified: Secondary | ICD-10-CM | POA: Diagnosis not present

## 2019-05-12 DIAGNOSIS — M549 Dorsalgia, unspecified: Secondary | ICD-10-CM | POA: Diagnosis not present

## 2019-05-12 DIAGNOSIS — Z1389 Encounter for screening for other disorder: Secondary | ICD-10-CM | POA: Diagnosis not present

## 2019-05-24 ENCOUNTER — Ambulatory Visit: Payer: PPO | Admitting: Cardiology

## 2019-05-24 DIAGNOSIS — I10 Essential (primary) hypertension: Secondary | ICD-10-CM | POA: Diagnosis not present

## 2019-05-24 DIAGNOSIS — M2550 Pain in unspecified joint: Secondary | ICD-10-CM | POA: Diagnosis not present

## 2019-05-24 DIAGNOSIS — M26629 Arthralgia of temporomandibular joint, unspecified side: Secondary | ICD-10-CM | POA: Diagnosis not present

## 2019-05-24 DIAGNOSIS — H6121 Impacted cerumen, right ear: Secondary | ICD-10-CM | POA: Diagnosis not present

## 2019-05-25 DIAGNOSIS — G4733 Obstructive sleep apnea (adult) (pediatric): Secondary | ICD-10-CM | POA: Diagnosis not present

## 2019-05-30 ENCOUNTER — Telehealth: Payer: Self-pay | Admitting: Internal Medicine

## 2019-05-30 NOTE — Telephone Encounter (Signed)
Forwarding to [facility] since this is regarding denial for CPAP, thanks

## 2019-05-31 NOTE — Telephone Encounter (Signed)
I spoke with the patient and she states that she has already received her CPAP from [facility]. She states that she knew they were not in network. She states that [facility] states that she would have to start all over with them including a new sleep study. She states that she is paying a monthly payment with [facility] and was going to have to do this with [facility] also, so she stayed with [facility]. She states that she got the letter after she had already gotten her CPAP machine. She just wanted to know if it was possible to write the letter, but if not its ok she will continue to pay monthly.

## 2019-05-31 NOTE — Telephone Encounter (Signed)
Spoke with the Mindy Ingram  She states that she has already contacted [facility]  She states she got a letter from [employer] [facility] stating that her CPAP was denied  She is requesting that Dr Annamaria Boots right a letter of medical necessity for CPAP for her  Please advise thanks

## 2019-05-31 NOTE — Telephone Encounter (Signed)
I don't have a way to change what her insurance would insist on- I'm sorry.

## 2019-05-31 NOTE — Telephone Encounter (Signed)
We do not auth cpas the dme company does pt needs to contact [facility] Mindy Ingram

## 2019-05-31 NOTE — Telephone Encounter (Signed)
Her insurance is not going to pay to replace her CPAP machine through [facility] because [facility] is out of network. The fact that she likes [facility] won't carry any weight with her insurance- there is not a medical justification for them to reverse.  I recommend that we send her replacement CPAP order to [facility], where she gets her supplies already.

## 2019-06-02 NOTE — Telephone Encounter (Signed)
Called and spoke to pt. Informed her of the recs per CY. Pt aware a letter will not hold water as she knowingly accepted the CPAP with [facility] knowing they are out of network. Pt states she is doing well otherwise and denies any complaints at this time. Will sign off.

## 2019-06-08 DIAGNOSIS — E559 Vitamin D deficiency, unspecified: Secondary | ICD-10-CM | POA: Diagnosis not present

## 2019-06-08 DIAGNOSIS — J309 Allergic rhinitis, unspecified: Secondary | ICD-10-CM | POA: Diagnosis not present

## 2019-06-08 DIAGNOSIS — Z79899 Other long term (current) drug therapy: Secondary | ICD-10-CM | POA: Diagnosis not present

## 2019-06-08 DIAGNOSIS — I10 Essential (primary) hypertension: Secondary | ICD-10-CM | POA: Diagnosis not present

## 2019-06-08 DIAGNOSIS — E785 Hyperlipidemia, unspecified: Secondary | ICD-10-CM | POA: Diagnosis not present

## 2019-06-08 DIAGNOSIS — M1990 Unspecified osteoarthritis, unspecified site: Secondary | ICD-10-CM | POA: Diagnosis not present

## 2019-06-08 DIAGNOSIS — K219 Gastro-esophageal reflux disease without esophagitis: Secondary | ICD-10-CM | POA: Diagnosis not present

## 2019-06-08 DIAGNOSIS — Z131 Encounter for screening for diabetes mellitus: Secondary | ICD-10-CM | POA: Diagnosis not present

## 2019-06-16 ENCOUNTER — Ambulatory Visit
Admission: RE | Admit: 2019-06-16 | Discharge: 2019-06-16 | Disposition: A | Discharge status: Home / Self Care | Payer: PPO | Source: Ambulatory Visit | Attending: Internal Medicine | Admitting: Internal Medicine

## 2019-06-16 ENCOUNTER — Other Ambulatory Visit: Payer: Self-pay

## 2019-06-16 DIAGNOSIS — N6320 Unspecified lump in the left breast, unspecified quadrant: Secondary | ICD-10-CM

## 2019-06-16 DIAGNOSIS — N6321 Unspecified lump in the left breast, upper outer quadrant: Secondary | ICD-10-CM | POA: Diagnosis not present

## 2019-06-16 DIAGNOSIS — R922 Inconclusive mammogram: Secondary | ICD-10-CM | POA: Diagnosis not present

## 2019-06-24 DIAGNOSIS — G4733 Obstructive sleep apnea (adult) (pediatric): Secondary | ICD-10-CM | POA: Diagnosis not present

## 2019-07-22 ENCOUNTER — Telehealth: Payer: Self-pay | Admitting: Internal Medicine

## 2019-07-22 NOTE — Telephone Encounter (Signed)
lmtcb for pt.  

## 2019-07-22 NOTE — Telephone Encounter (Signed)
Returning call [phone]

## 2019-07-26 NOTE — Telephone Encounter (Signed)
Spoke with patient regarding prior message about the COVID vaccine . Per Dr. Annamaria Boots had a advised patient to take a antihistamine benadryl or zyrtec a hour before she get's the shot. And advised patient when she get's the vaccine that they have a waiting period before she can leave. Patient voice was understanding. Nothing else further needed.

## 2019-07-26 NOTE — Telephone Encounter (Signed)
She should get the vaccine. I have suggested that people with allergies take an antihistamine like benadryl or zyrtec an hour before they get the shot. Almost all sites have waiting areas where they have patients stay for a while after they get their shot, before going home.

## 2019-07-26 NOTE — Telephone Encounter (Signed)
Spoke with the pt  She is asking with all of her allergies should she get the covid vaccine  She is concerned about where to go and wants to go to a special place for people with allergies to be monitored carefully afterwards  Please advise thanks

## 2019-09-13 ENCOUNTER — Ambulatory Visit: Payer: PPO | Admitting: Internal Medicine

## 2019-10-18 ENCOUNTER — Encounter: Payer: Self-pay | Admitting: Internal Medicine

## 2019-10-20 ENCOUNTER — Ambulatory Visit (INDEPENDENT_AMBULATORY_CARE_PROVIDER_SITE_OTHER): Payer: Medicare Other | Admitting: Internal Medicine

## 2019-10-20 ENCOUNTER — Encounter: Payer: Self-pay | Admitting: Internal Medicine

## 2019-10-20 ENCOUNTER — Other Ambulatory Visit: Payer: Self-pay

## 2019-10-20 DIAGNOSIS — G4733 Obstructive sleep apnea (adult) (pediatric): Secondary | ICD-10-CM | POA: Diagnosis not present

## 2019-10-20 DIAGNOSIS — J4521 Mild intermittent asthma with (acute) exacerbation: Secondary | ICD-10-CM

## 2019-10-20 NOTE — Patient Instructions (Signed)
Order- DME [facility] continue CPAP auto 5-17, mask of choice, humidifier, supplies, AirView/ card  Please call if we can help

## 2019-10-20 NOTE — Progress Notes (Signed)
Subjective:    Patient ID: Mindy Ingram, female    DOB: [birth year], 74 y.o.   MRN: [id number]  HPI F never smoker followed for obstructive sleep apnea, allergic rhinitis complicated by DM, bronchitis, GERD. Office Spirometry 04/03/2015- WNL FVC 2.14/110%, FEV1 1.94/128%, FEV1/FVC 0.91 . NPSG 11/12/10- AHI  17.9/ hr, desat to 76% titrated to CPAP 12, body weight 165 lbs Office spirometry- 11/04/16--by allergy office- FEV1/FVC 0.77 WNL -------------------------------------------------------------------------------.  09/13/2018- 74 year old female never smoker followed for OSA, complicated by allergic rhinitis, asthmatic bronchitis, DM2, GERD,  CPAP auto 5-15/ [facility]) Download 100% compliance, AHI 2.8/ hr -----OSA on CPAP; uses every night, no issues w/ pressure settings or mask; no new or worsening breathing symptoms Body weight today 153 lbs Asthma is doing well- rare need for rescue inhaler.  4/22/211- Virtual Visit via Telephone Note  I connected with Mindy Ingram on 10/20/19 at  2:30 PM EDT by telephone and verified that I am speaking with the correct person using two identifiers.  Location: Patient: H Provider: O   I discussed the limitations, risks, security and privacy concerns of performing an evaluation and management service by telephone and the availability of in person appointments. I also discussed with the patient that there may be a patient responsible charge related to this service. The patient expressed understanding and agreed to proceed.   History of Present Illness: 74 year old female never smoker followed for OSA, complicated by allergic rhinitis, asthmatic bronchitis, DM2, GERD,  CPAP auto 5-17/ [facility] ([place])   Machine replaced 11/2019 Download compliance 97%, AHI 2.1/ hr Proventil hfa, flonase,  Hasn't felt any need for rescue inhaler   Observations/Objective:   Assessment and Plan: OSA- good compliance and control-  cntinue auto 5-17 Asthmatic bronchitis- mild intermittent uncomplicated  Follow Up Instructions: 1 year   I discussed the assessment and treatment plan with the patient. The patient was provided an opportunity to ask questions and all were answered. The patient agreed with the plan and demonstrated an understanding of the instructions.   The patient was advised to call back or seek an in-person evaluation if the symptoms worsen or if the condition fails to improve as anticipated.  I provided 21 minutes of non-face-to-face time during this encounter.   Baird Lyons, MD     Review of Systems- see HPI + = positive Constitutional:   No-   weight loss, night sweats, fevers, chills, fatigue, lassitude. HEENT:   No-  headaches, difficulty swallowing, tooth/dental problems, sore throat,       No-  sneezing, itching, ear ache, +nasal congestion,+post nasal drip,  CV:  No-   chest pain, orthopnea, PND, swelling in lower extremities, anasarca, dizziness, palpitations Resp: No-   shortness of breath with exertion or at rest.              No-   productive cough,  No non-productive cough,  No-  coughing up of blood.              No-   change in color of mucus.  No- wheezing.   Skin:  GI:  No-   heartburn, indigestion, abdominal pain, nausea, vomiting,  GU:  MS:  No-   joint pain or swelling.   Neuro- nothing unusual  Psych:  No- change in mood or affect. No depression or anxiety.  No memory loss.    Objective:   Physical Exam    Mask Covid precaution General- Alert, Oriented, Affect-appropriate, Distress- none acute, +  overweight Skin- +Raised nodules right nasolabial fold without erythema or excoriation Lymphadenopathy- none Head- atraumatic            Eyes- Gross vision intact, PERRLA, conjunctivae clear secretions            Ears-hearing normal            Nose- +turbinate edema, No-Septal dev, mucus+, No-polyps, erosion, perforation             Throat- Mallampati II , mucosa clear ,  drainage- none, tonsils- atrophic Neck- flexible , trachea midline, no stridor , thyroid nl, carotid no bruit Chest - symmetrical excursion , unlabored           Heart/CV- RRR , no murmur , no gallop  , no rub, nl s1 s2                           - JVD- none , edema- none, stasis changes- none, varices- none           Lung- clear to P&A, wheeze-none, unlabored, cough-none, dullness-none, rub- none           Chest wall-  Abd- Br/ Gen/ Rectal- Not done, not indicated Extrem- cyanosis- none, clubbing, none, atrophy- none, strength- nl. Cane. Neuro- grossly intact to observation Assessment & Plan:

## 2019-11-29 NOTE — Assessment & Plan Note (Signed)
Well controlled with no exacerbation and no recent need for rescue inhaler Plan- keep albuterol available

## 2019-11-29 NOTE — Assessment & Plan Note (Signed)
Benefits with good compliance and control Plan- continue auto 5-17

## 2019-12-12 ENCOUNTER — Other Ambulatory Visit: Payer: Self-pay

## 2019-12-20 ENCOUNTER — Ambulatory Visit (INDEPENDENT_AMBULATORY_CARE_PROVIDER_SITE_OTHER): Payer: Medicare Other | Admitting: Cardiology

## 2019-12-20 ENCOUNTER — Other Ambulatory Visit: Payer: Self-pay

## 2019-12-20 ENCOUNTER — Encounter: Payer: Self-pay | Admitting: Cardiology

## 2019-12-20 VITALS — BP 160/80 | Ht 71.5 in | Wt 149.0 lb

## 2019-12-20 DIAGNOSIS — E088 Diabetes mellitus due to underlying condition with unspecified complications: Secondary | ICD-10-CM | POA: Diagnosis not present

## 2019-12-20 DIAGNOSIS — G4733 Obstructive sleep apnea (adult) (pediatric): Secondary | ICD-10-CM

## 2019-12-20 DIAGNOSIS — I10 Essential (primary) hypertension: Secondary | ICD-10-CM | POA: Diagnosis not present

## 2019-12-20 MED ORDER — AMLODIPINE BESYLATE 5 MG PO TABS: 7.5000 mg | ORAL_TABLET | Freq: Every day | ORAL | 3 refills | Status: DC

## 2019-12-20 MED ORDER — LOSARTAN POTASSIUM-HCTZ 50-12.5 MG PO TABS: 1.0000 | ORAL_TABLET | Freq: Every day | ORAL | 3 refills | Status: DC

## 2019-12-20 NOTE — Progress Notes (Signed)
Cardiology Office Note:    Date:  12/20/2019   ID:  Mindy Ingram, DOB 06-22-1946, MRN [id number]  PCP:  Josetta Huddle, MD  Cardiologist:  Jenean Lindau, MD   Referring MD: Josetta Huddle, MD    ASSESSMENT:    1. Essential hypertension   2. Diabetes mellitus due to underlying condition with unspecified complications ([place])   3. Obstructive sleep apnea    PLAN:    In order of problems listed above:  1. Primary prevention stressed with the patient.  Importance of compliance with diet medication stressed and she vocalized understanding. 2. Essential hypertension: Blood pressure stable we will continue current medications.  I will increase her amlodipine to 7.5 mg daily because of elevated blood pressure and she will keep a track of her blood pressures and send it to [place] in the next 2 to 3 weeks. 3. Mixed dyslipidemia: Lipids managed by primary care physician.  Diet was emphasized weight reduction stressed.  Importance of regular exercise stressed and she promises to comply 4. Diabetes mellitus: Hemoglobin A1c is elevated and patient will take appropriate measures with diet and exercise and is followed by primary care physician with this. 5. Patient will be seen in follow-up appointment in 6 months or earlier if the patient has any concerns    Medication Adjustments/Labs and Tests Ordered: Current medicines are reviewed at length with the patient today.  Concerns regarding medicines are outlined above.  No orders of the defined types were placed in this encounter.  No orders of the defined types were placed in this encounter.    No chief complaint on file.    History of Present Illness:    Mindy Ingram is a 74 y.o. female.  Patient has past medical history of essential hypertension obstructive sleep apnea and diabetes mellitus.  She denies any problems at this time and takes care of activities of daily living.  No chest pain orthopnea or PND.  She also has  dyslipidemia.  She has had a sedentary lifestyle in the past 1 year.  At the time of my evaluation, the patient is alert awake oriented and in no distress.  Past Medical History:  Diagnosis Date  . Allergic rhinitis   . Asthma   . Bronchitis    none in years  . DJD (degenerative joint disease)   . Fibromyalgia   . GERD (gastroesophageal reflux disease)   . Hypertension   . OSA (obstructive sleep apnea)    cpap setting of 10  . Otitis media    both ears with impacted cerumem  . Rapid heartbeat    no metoprolol lin pm  . Sleep apnea   . Type II or unspecified type diabetes mellitus without mention of complication, not stated as uncontrolled     Past Surgical History:  Procedure Laterality Date  . ABDOMINAL HYSTERECTOMY    . APPENDECTOMY    . BACK SURGERY  2013   lower back with plates and screws  . benign lump right axilla    . [place]   at [facility], slight limitation with turning neck  . CHOLECYSTECTOMY    . EUS N/A 07/21/2013   Procedure: UPPER ENDOSCOPIC ULTRASOUND (EUS) LINEAR;  Surgeon: Milus Banister, MD;  Location: [facility];  Service: Endoscopy;  Laterality: N/A;  . NEUROPLASTY / TRANSPOSITION MEDIAN NERVE AT [place]  . PARTIAL COLECTOMY  1990   benign adhesions  . right foot surgery  2000  right great toe with artificial bone inserted  . right knee arthroscopy    . tah and bso      Current Medications: Current Meds  Medication Sig  . albuterol (PROVENTIL HFA;VENTOLIN HFA) 108 (90 Base) MCG/ACT inhaler Inhale 2 puffs every 4-6 hours as needed  . amLODipine (NORVASC) 2.5 MG tablet Take 2.5 mg by mouth daily.  Marland Kitchen aspirin EC 81 MG tablet Take 81 mg by mouth daily.  Marland Kitchen azelastine (ASTELIN) 0.1 % nasal spray Place 1 spray into the nose 2 (two) times daily.  . cetirizine (ZYRTEC) 10 MG tablet Take 1 tablet by mouth daily.  . Cholecalciferol (VITAMIN D PO) Take 2,000 Units by mouth daily.   Kendall Flack 575 MG/5ML SYRP  Take 5 mLs by mouth daily.  . fluticasone (FLONASE) 50 MCG/ACT nasal spray 1-2 puffs each nostril once daily  . Lancets (ONETOUCH DELICA PLUS [id number]) MISC CHECK BLOOD SUGAR TWICE DAILY PRIOR TO MEAL  . meloxicam (MOBIC) 7.5 MG tablet Take 7.5 mg by mouth 2 (two) times a week.   . metFORMIN (GLUCOPHAGE-XR) 500 MG 24 hr tablet Take 1,000 mg by mouth daily.  . metoprolol (TOPROL-XL) 200 MG 24 hr tablet Take 200 mg by mouth daily.  . Multiple Vitamins-Minerals (COMPLETE ENERGY) TABS Take 1 tablet by mouth daily.   . nitroGLYCERIN (NITROSTAT) 0.4 MG SL tablet Place 1 tablet (0.4 mg total) under the tongue every 5 (five) minutes as needed for chest pain.  . Omega-3 Fatty Acids (FISH OIL) 1000 MG CAPS Take 1 capsule by mouth daily.  Marland Kitchen omeprazole (PRILOSEC) 20 MG capsule Take 1 capsule by mouth daily.  Glory Rosebush VERIO test strip TEST BLOOD SUGAR TWICE DAILY PRIOR TO MEALS  . rosuvastatin (CRESTOR) 10 MG tablet Take 10 mg by mouth 2 (two) times a week.  . traMADol (ULTRAM) 50 MG tablet Take 50 mg by mouth 2 (two) times daily.  Marland Kitchen triamcinolone (KENALOG) 0.025 % ointment Apply 1 application topically 2 (two) times daily.  . valsartan-hydrochlorothiazide (DIOVAN-HCT) 160-12.5 MG tablet Take 1 tablet by mouth daily.     Allergies:   Dairy aid [lactase], Lactose, Anoro ellipta [umeclidinium-vilanterol], Compazine [prochlorperazine edisylate], Hydrocodone-acetaminophen, Hydrocodone-acetaminophen, Lipitor [atorvastatin], Myrbetriq [mirabegron], Oxycontin [oxycodone hcl], Prochlorperazine edisylate, Rosuvastatin, Shellfish allergy, and Percodan [oxycodone-aspirin]   Social History   Socioeconomic History  . Marital status: Widowed    Spouse name: Not on file  . Number of children: 1  . Years of education: Not on file  . Highest education level: Not on file  Occupational History  . Not on file  Tobacco Use  . Smoking status: Never Smoker  . Smokeless tobacco: Never Used  Substance and Sexual  Activity  . Alcohol use: No  . Drug use: No  . Sexual activity: Not on file  Other Topics Concern  . Not on file  Social History Narrative  . Not on file   Social Determinants of Health   Financial Resource Strain:   . Difficulty of Paying Living Expenses:   Food Insecurity:   . Worried About Charity fundraiser in the Last Year:   . Arboriculturist in the Last Year:   Transportation Needs:   . Film/video editor (Medical):   Marland Kitchen Lack of Transportation (Non-Medical):   Physical Activity:   . Days of Exercise per Week:   . Minutes of Exercise per Session:   Stress:   . Feeling of Stress :   Social Connections:   . Frequency of  Communication with Friends and Family:   . Frequency of Social Gatherings with Friends and Family:   . Attends Religious Services:   . Active Member of Clubs or Organizations:   . Attends Archivist Meetings:   Marland Kitchen Marital Status:      Family History: The patient's family history includes Breast cancer in her maternal aunt. There is no history of Allergies, Asthma, Eczema, or Immunodeficiency.  ROS:   Please see the history of present illness.    All other systems reviewed and are negative.  EKGs/Labs/Other Studies Reviewed:    The following studies were reviewed today: EKG reveals sinus rhythm and nonspecific ST-T changes   Recent Labs: No results found for requested labs within last 8760 hours.  Recent Lipid Panel No results found for: CHOL, TRIG, HDL, CHOLHDL, VLDL, LDLCALC, LDLDIRECT  Physical Exam:    VS:  BP (!) 160/80   Ht 5' 11.5" (1.816 m)   Wt 149 lb (67.6 kg)   SpO2 97%   BMI 20.49 kg/m     Wt Readings from Last 3 Encounters:  12/20/19 149 lb (67.6 kg)  05/10/19 146 lb 3.2 oz (66.3 kg)  09/13/18 153 lb 3.2 oz (69.5 kg)     GEN: Patient is in no acute distress HEENT: Normal NECK: No JVD; No carotid bruits LYMPHATICS: No lymphadenopathy CARDIAC: Hear sounds regular, 2/6 systolic murmur at the  apex. RESPIRATORY:  Clear to auscultation without rales, wheezing or rhonchi  ABDOMEN: Soft, non-tender, non-distended MUSCULOSKELETAL:  No edema; No deformity  SKIN: Warm and dry NEUROLOGIC:  Alert and oriented x 3 PSYCHIATRIC:  Normal affect   Signed, Jenean Lindau, MD  12/20/2019 11:40 AM    [place]

## 2019-12-20 NOTE — Addendum Note (Signed)
Addended by: Truddie Hidden on: 12/20/2019 11:55 AM   Modules accepted: Orders

## 2019-12-20 NOTE — Addendum Note (Signed)
Addended by: Truddie Hidden on: 12/20/2019 11:47 AM   Modules accepted: Orders

## 2019-12-20 NOTE — Patient Instructions (Addendum)
Medication Instructions:  Your physician has recommended you make the following change in your medication: increase your Amlodipine to 7.5 mg (1 1/2 tablet) daily. Stop Valsartan and restart Losartan/HCTZ *If you need a refill on your cardiac medications before your next appointment, please call your pharmacy*   Lab Work: None ordered If you have labs (blood work) drawn today and your tests are completely normal, you will receive your results only by: Marland Kitchen MyChart Message (if you have MyChart) OR . A paper copy in the mail If you have any lab test that is abnormal or we need to change your treatment, we will call you to review the results.   Testing/Procedures: None ordered   Follow-Up: At [facility], you and your health needs are our priority.  As part of our continuing mission to provide you with exceptional heart care, we have created designated Provider Care Teams.  These Care Teams include your primary Cardiologist (physician) and Advanced Practice Providers (APPs -  Physician Assistants and Nurse Practitioners) who all work together to provide you with the care you need, when you need it.  We recommend signing up for the patient portal called "MyChart".  Sign up information is provided on this After Visit Summary.  MyChart is used to connect with patients for Virtual Visits (Telemedicine).  Patients are able to view lab/test results, encounter notes, upcoming appointments, etc.  Non-urgent messages can be sent to your provider as well.   To learn more about what you can do with MyChart, go to [email or web address].    Your next appointment:   6 month(s)  The format for your next appointment:   In Person  Provider:   [name], MD   Other Instructions Amlodipine; Atorvastatin oral tablets What is this medicine? AMLODIPINE; ATORVASTATIN (am LOE di peen; a TORE va sta tin) is a combination of 2 drugs. Amlodipine is a calcium-channel blocker used to lower high blood  pressure. It also relieves chest pain caused by angina. Atorvastatin blocks the body's ability to make cholesterol. It can help lower blood cholesterol for patients who are at risk of getting heart disease or a stroke. It is only for patients whose cholesterol level is not controlled by diet. This medicine may be used for other purposes; ask your health care provider or pharmacist if you have questions. COMMON BRAND NAME(S): Caduet What should I tell my health care provider before I take this medicine? They need to know if you have any of these conditions:  an alcohol problem  heart problems, including heart failure or aortic stenosis  hormone disorder like diabetes or under-active thyroid  infection  kidney or liver disease  low blood pressure  other medical condition  recent surgery  seizures (convulsions)  severe injury  an unusual or allergic reaction to Amlodipine; Atorvastatin, medicines, foods, dyes, or preservatives  pregnant or trying to get pregnant  breast-feeding How should I use this medicine? Take this medicine by mouth with a glass of water. Follow the directions on the prescription label. You can take the tablets with or without food. Do not change the amount of grapefruit juice you drink from day to day while taking this drug, or avoid grapefruit juice altogether. Take your doses at regular intervals. Do not take your medicine more often then directed. Do not suddenly stop taking this medicine. Ask your doctor or health care professional how you can gradually reduce the dose. Talk to your pediatrician regarding the use of this medicine in children. Special care  may be needed. Overdosage: If you think you have taken too much of this medicine contact a poison control center or emergency room at once. NOTE: This medicine is only for you. Do not share this medicine with others. What if I miss a dose? If you miss a dose, take it as soon as you can. If it is almost  time for your next dose, take only that dose. Do not take double or extra doses. What may interact with this medicine? Do not take this medicine with any of the following medications:  other cholesterol medicines known as statins like fluvastatin, lovastatin, pravastatin, and simvastatin  red yeast rice  telaprevir  telithromycin  voriconazole This medicine may also interact with the following medications:  antiviral medicines for HIV or AIDS  boceprevir  certain medicines for cholesterol like clofibrate, fenofibrate, and gemfibrozil  cyclosporine  digoxin  female hormones, like estrogens or progestins and birth control pills  grapefruit juice  medicines for fungal infections like fluconazole, itraconazole, ketoconazole, voriconazole  medicines for high blood pressure  niacin  rifampin  some antibiotics like clarithromycin, erythromycin, and troleandomycin This list may not describe all possible interactions. Give your health care provider a list of all the medicines, herbs, non-prescription drugs, or dietary supplements you use. Also tell them if you smoke, drink alcohol, or use illegal drugs. Some items may interact with your medicine. What should I watch for while using this medicine? Visit your doctor or health care professional for regular checks on your progress. You will need to have regular tests to make sure your liver is working properly. Tell your doctor or health care professional as soon as you can if you get any unexplained muscle pain, tenderness, or weakness, especially if you also have a fever and tiredness. Your doctor or health care professional may tell you to stop taking this medicine if you develop muscle problems. If your muscle problems do not go away after stopping this medicine, contact your health care professional. This medicine contains a cholesterol-lowering agent but is only part of a total cholesterol-lowering program. Your physician or  dietitian can suggest a low-cholesterol and low-fat diet that will reduce your risk of getting heart and blood vessel disease. Avoid alcohol and smoking, and keep a proper exercise schedule. Check your blood pressure and pulse rate regularly. Ask your doctor or health care professional what your blood pressure and pulse rate should be and when you should contact him or her. This medicine may affect blood sugar levels. If you have diabetes, check with your doctor or health care professional before you change your diet or the dose of your diabetic medicine. You may feel dizzy or lightheaded. Do not drive, use machinery, or do anything that needs mental alertness until you know how this medicine affects you. To reduce the risk of dizzy or fainting spells, do not sit or stand up quickly, especially if you are an older patient. Avoid alcoholic drinks. They can make you more dizzy, increase flushing and rapid heartbeats. This medicine may cause a decrease in Co-Enzyme Q-10. You should make sure that you get enough Co-Enzyme Q-10 while you are taking this medicine. Discuss the foods you eat and the vitamins you take with your health care professional. What side effects may I notice from receiving this medicine? Side effects that you should report to your doctor or health care professional as soon as possible:  allergic reactions like skin rash, itching or hives, swelling of the face, lips, or tongue  chest pain  dark urine  fast, irregular heartbeat  feeling faint or lightheaded, falls  fever  muscle pain, weakness  redness, blistering, peeling or loosening of the skin, including inside the mouth  swelling of ankles, legs  trouble passing urine or change in the amount of urine  unusually weak or tired  yellowing of the eyes or skin Side effects that usually do not require medical attention (report to your doctor or health care professional if they continue or are  bothersome):  diarrhea  facial flushing  gas  headache  nausea, vomiting  stomach upset or pain This list may not describe all possible side effects. Call your doctor for medical advice about side effects. You may report side effects to FDA at 1-800-FDA-1088. Where should I keep my medicine? Keep out of the reach of children. Store at room temperature between 15 and 30 degrees C (59 and 86 degrees F). Throw away any unused medicine after the expiration date. NOTE: This sheet is a summary. It may not cover all possible information. If you have questions about this medicine, talk to your doctor, pharmacist, or health care provider.  2020 Elsevier/Gold Standard (2017-01-28 09:58:24)   Blood Pressure Record Sheet To take your blood pressure, you will need a blood pressure machine. You can buy a blood pressure machine (blood pressure monitor) at your clinic, drug store, or online. When choosing one, consider:  An automatic monitor that has an arm cuff.  A cuff that wraps snugly around your upper arm. You should be able to fit only one finger between your arm and the cuff.  A device that stores blood pressure reading results.  Do not choose a monitor that measures your blood pressure from your wrist or finger. Follow your health care provider's instructions for how to take your blood pressure. To use this form:  Get one reading in the morning (a.m.) before you take any medicines.  Get one reading in the evening (p.m.) before supper.  Take at least 2 readings with each blood pressure check. This makes sure the results are correct. Wait 1-2 minutes between measurements.  Write down the results in the spaces on this form.  Repeat this once a week, or as told by your health care provider.  Make a follow-up appointment with your health care provider to discuss the results. Blood pressure log Date: _______________________  a.m. _____________________(1st reading)  _____________________(2nd reading)  p.m. _____________________(1st reading) _____________________(2nd reading) Date: _______________________  a.m. _____________________(1st reading) _____________________(2nd reading)  p.m. _____________________(1st reading) _____________________(2nd reading) Date: _______________________  a.m. _____________________(1st reading) _____________________(2nd reading)  p.m. _____________________(1st reading) _____________________(2nd reading) Date: _______________________  a.m. _____________________(1st reading) _____________________(2nd reading)  p.m. _____________________(1st reading) _____________________(2nd reading) Date: _______________________  a.m. _____________________(1st reading) _____________________(2nd reading)  p.m. _____________________(1st reading) _____________________(2nd reading) This information is not intended to replace advice given to you by your health care provider. Make sure you discuss any questions you have with your health care provider. Document Revised: 08/14/2017 Document Reviewed: 06/16/2017 Elsevier Patient Education  [place].

## 2020-01-26 ENCOUNTER — Other Ambulatory Visit: Payer: Self-pay | Admitting: Internal Medicine

## 2020-01-26 ENCOUNTER — Ambulatory Visit
Admission: RE | Admit: 2020-01-26 | Discharge: 2020-01-26 | Disposition: A | Discharge status: Home / Self Care | Payer: Medicare Other | Source: Ambulatory Visit | Attending: Internal Medicine | Admitting: Internal Medicine

## 2020-01-26 DIAGNOSIS — M545 Low back pain, unspecified: Secondary | ICD-10-CM

## 2020-02-19 IMAGING — US ULTRASOUND LEFT BREAST LIMITED
1 series · 6 of 6 positions shown · non-contrast
Comparison: Previous exam(s).

CLINICAL DATA: Patient recalled from screening for left breast
asymmetry.

EXAM:
DIGITAL DIAGNOSTIC LEFT MAMMOGRAM WITH CAD AND TOMO
ULTRASOUND LEFT BREAST

[Series 1: ultrasound left breast limited · 0.07mm/px · 6 of 6 slices shown]
[im 1/6]
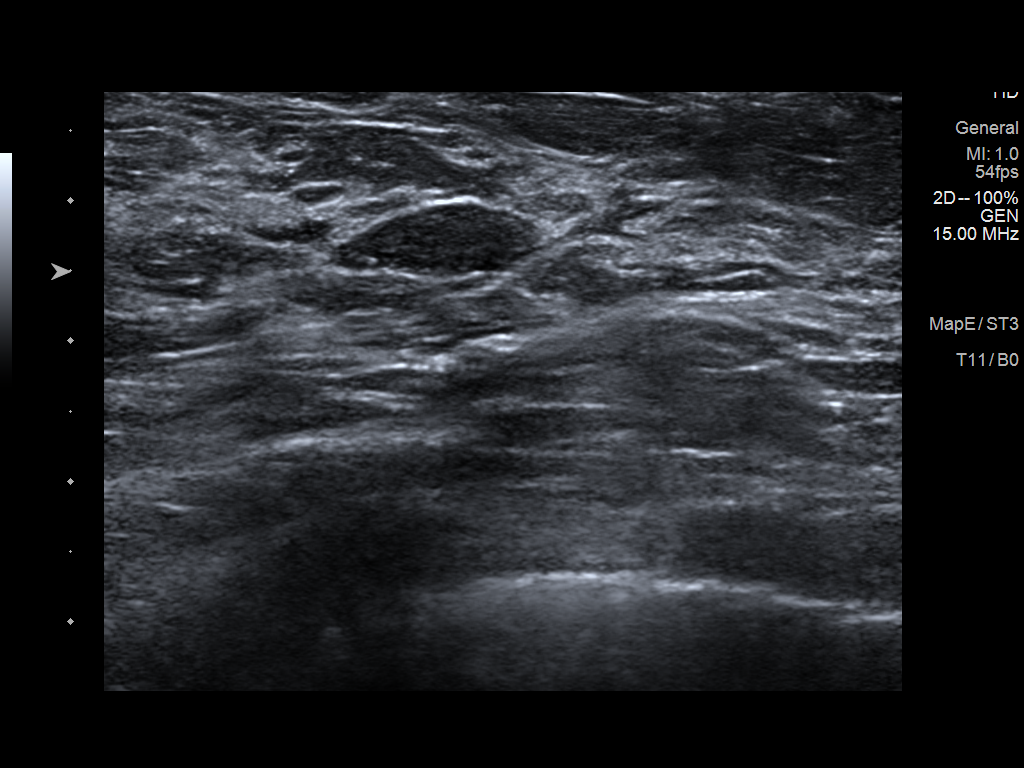
[im 2/6]
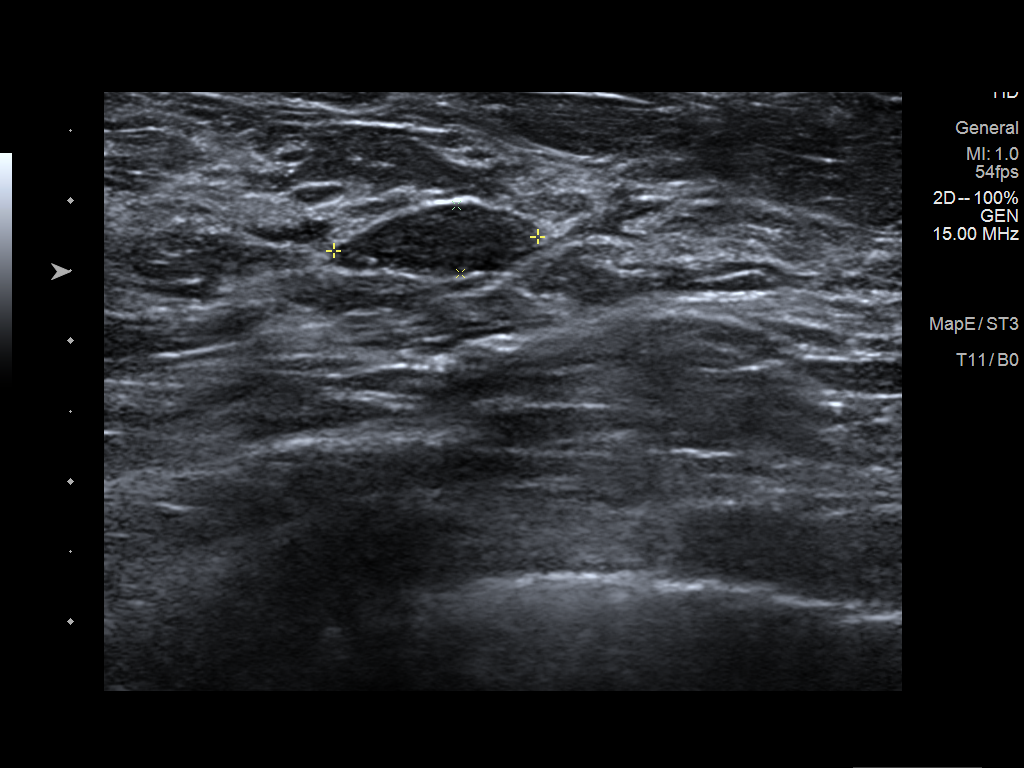
[im 3/6]
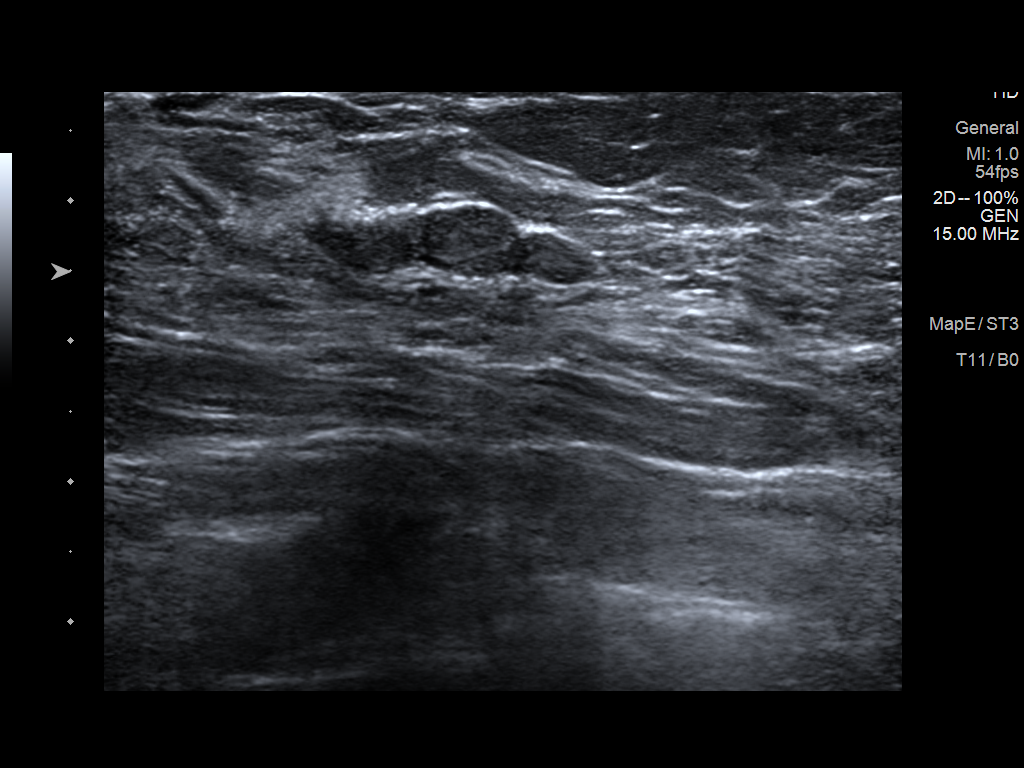
[im 4/6]
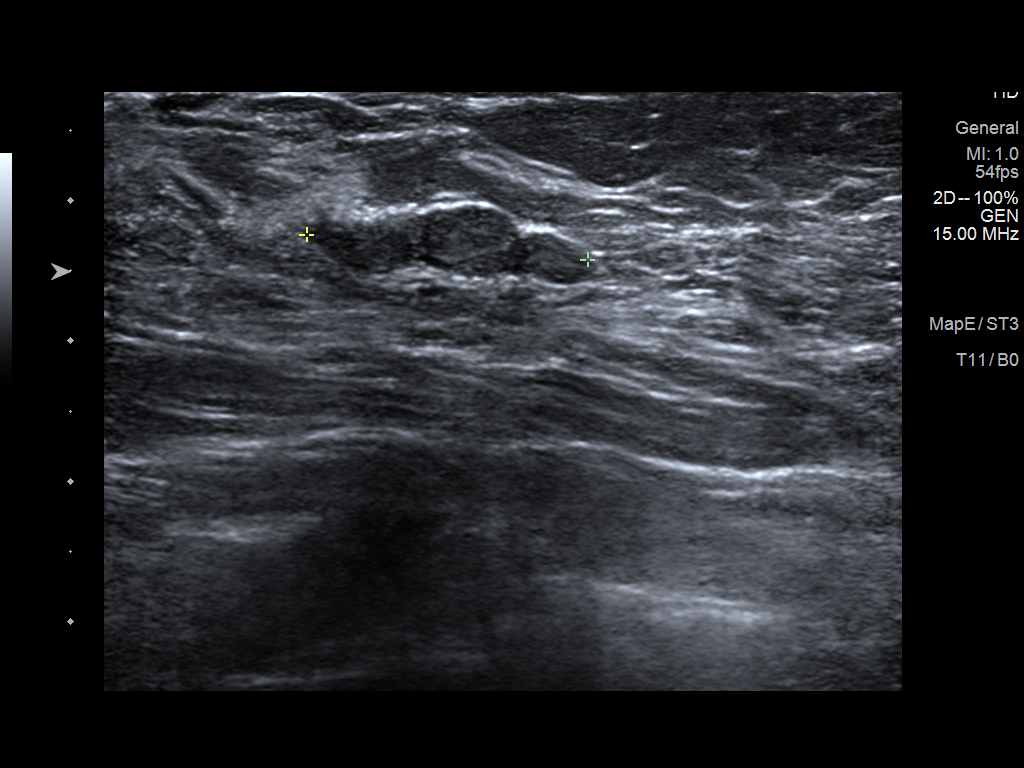
[im 5/6]
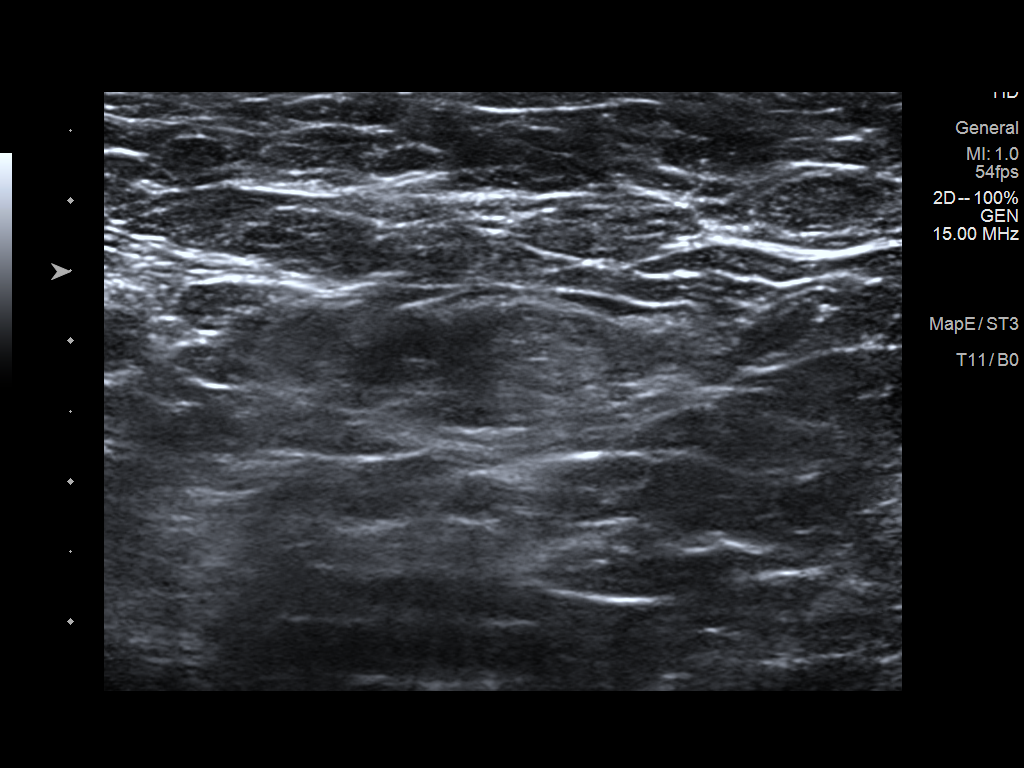
[im 6/6]
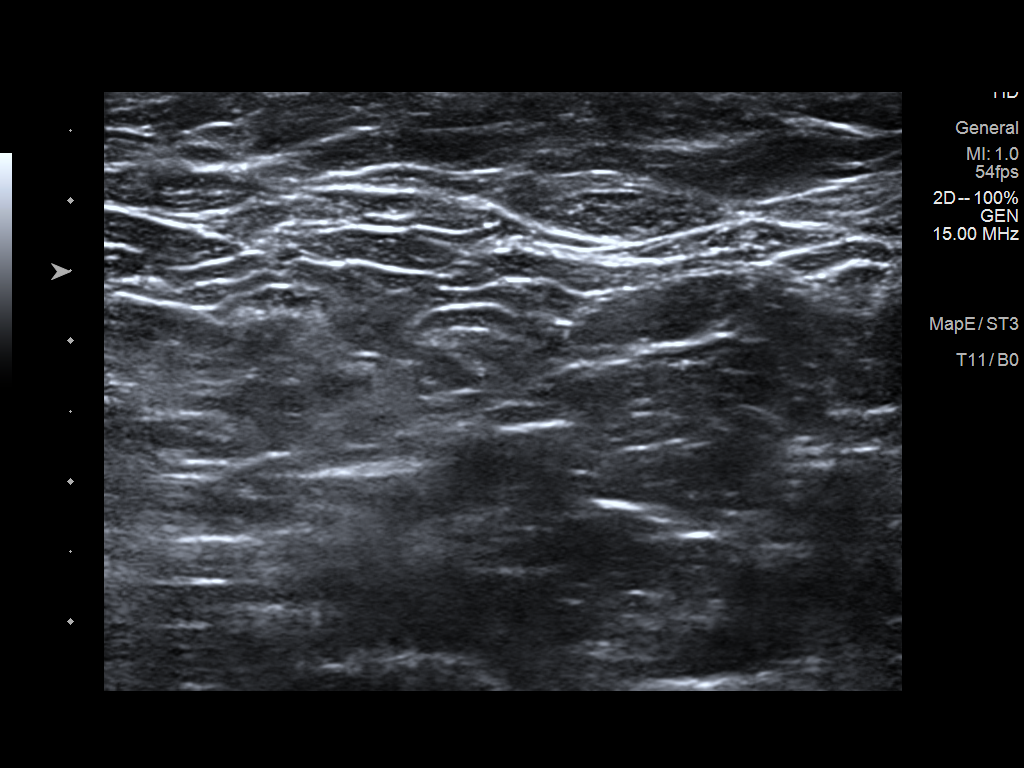

[6 of 6 positions shown; findings below may reference images not displayed]

ACR Breast Density Category b: There are scattered areas of
fibroglandular density.
FINDINGS: Spot compression CC and MLO views of the left breast were obtained
demonstrating an obscured mass within the upper-outer left breast
anterior depth.

Mammographic images were processed with CAD.

On physical exam, I palpate no discrete mass within the upper-outer
left breast.

Targeted ultrasound is performed, showing a 1.5 x 0.5 x 2.0 cm oval
circumscribed hypoechoic mass left breast 2 o'clock position 1 cm
from the nipple.
IMPRESSION: Probably benign left breast mass, favored to represent a
fibroadenoma.

RECOMMENDATION:
Left breast ultrasound in 6 months to ensure stability of probably
benign left breast mass.

I have discussed the findings and recommendations with the patient.
Results were also provided in writing at the conclusion of the
visit. If applicable, a reminder letter will be sent to the patient
regarding the next appointment.

BI-RADS CATEGORY  3: Probably benign.

## 2020-02-19 IMAGING — MG DIGITAL DIAGNOSTIC UNILATERAL LEFT MAMMOGRAM WITH TOMO AND CAD
4 series · 4 of 12 positions shown · non-contrast
Comparison: Previous exam(s).

CLINICAL DATA: Patient recalled from screening for left breast
asymmetry.

EXAM:
DIGITAL DIAGNOSTIC LEFT MAMMOGRAM WITH CAD AND TOMO
ULTRASOUND LEFT BREAST

[L CC synth-2D]
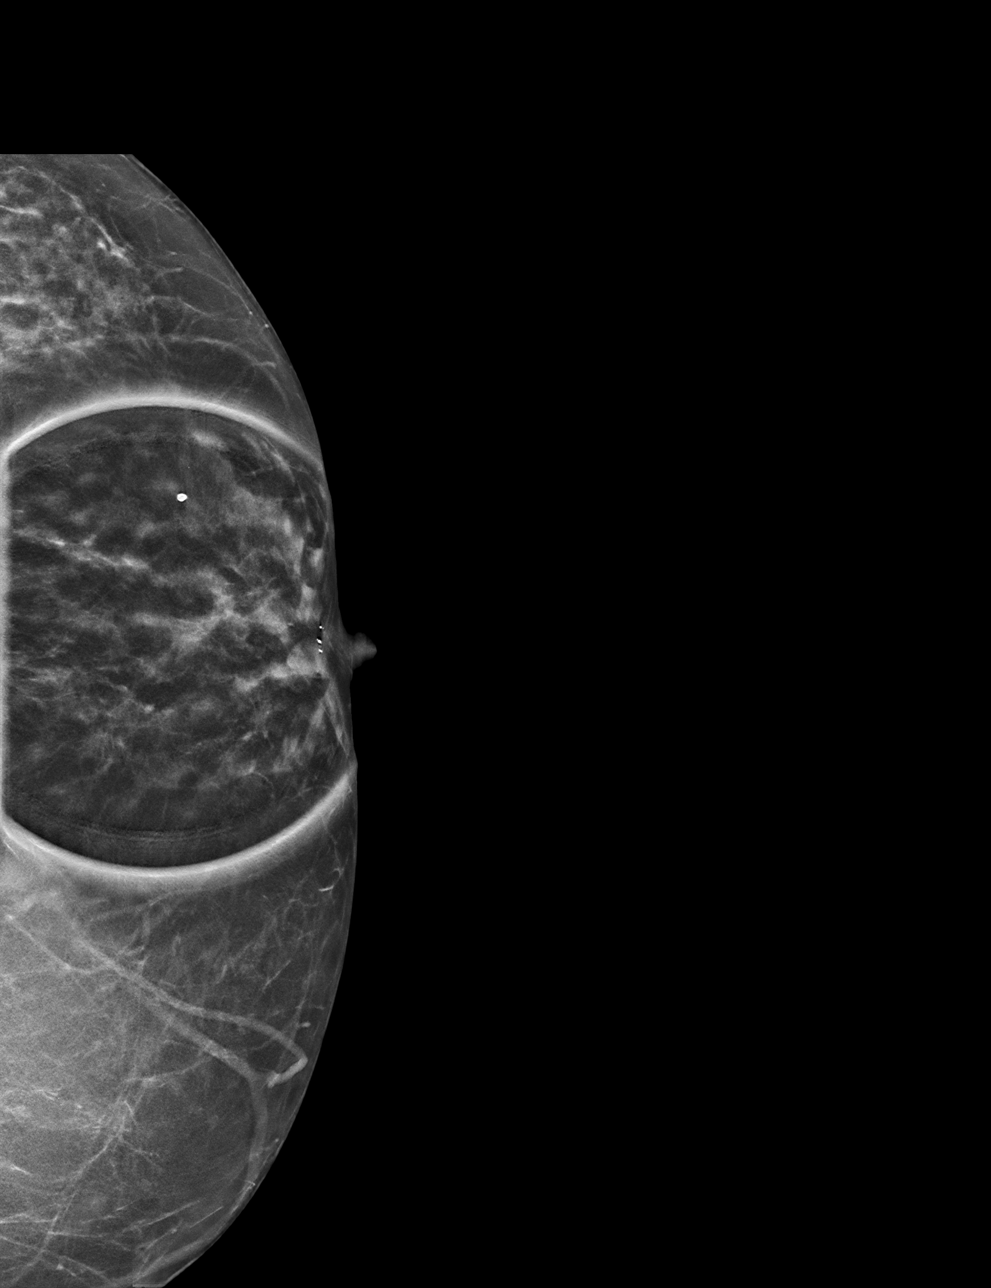

[L MLO synth-2D]
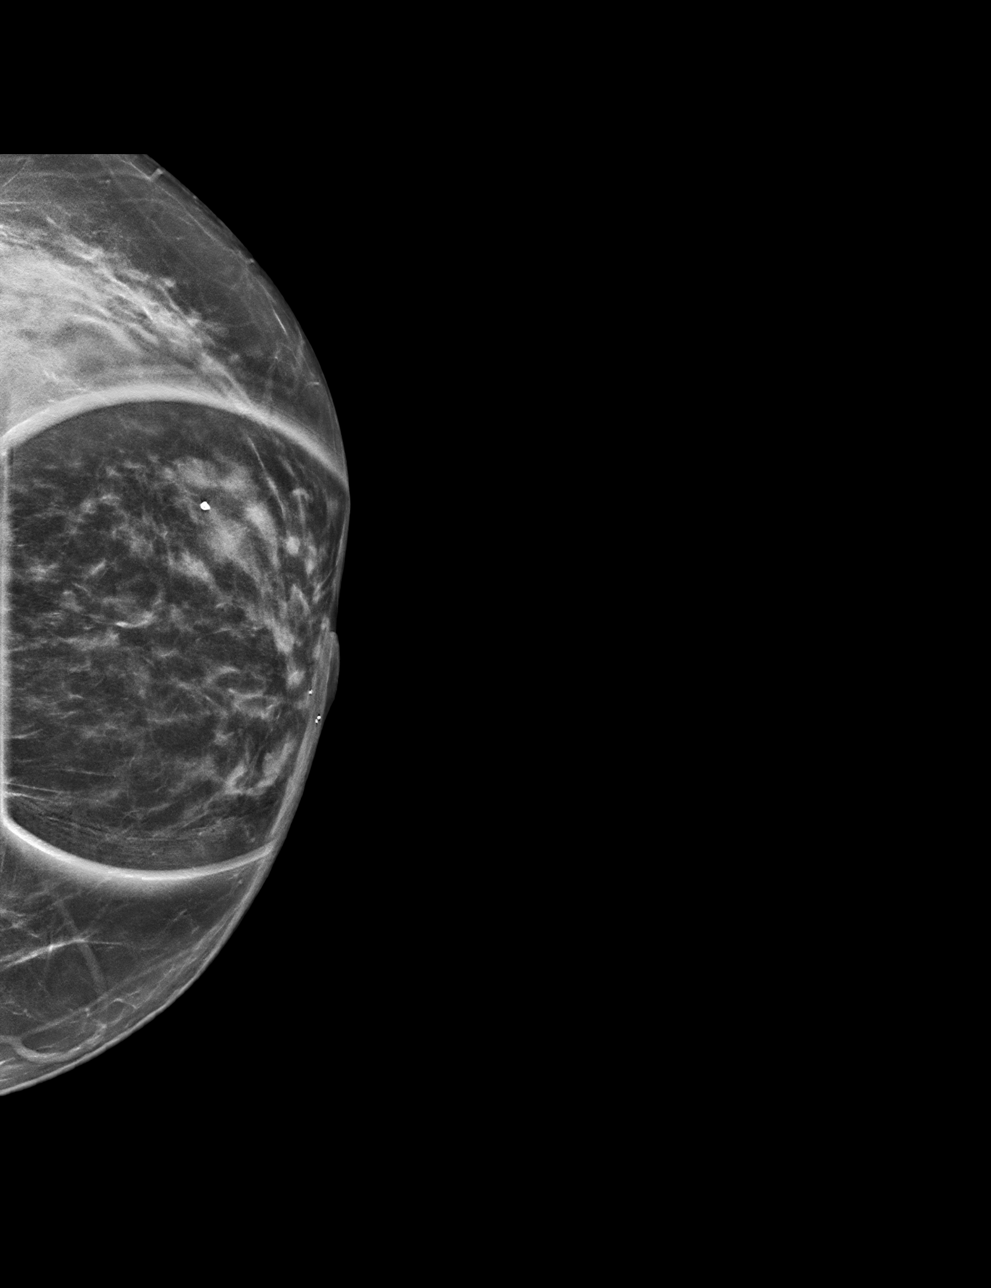

[L CC tomo · tomo slice 22/43.0]
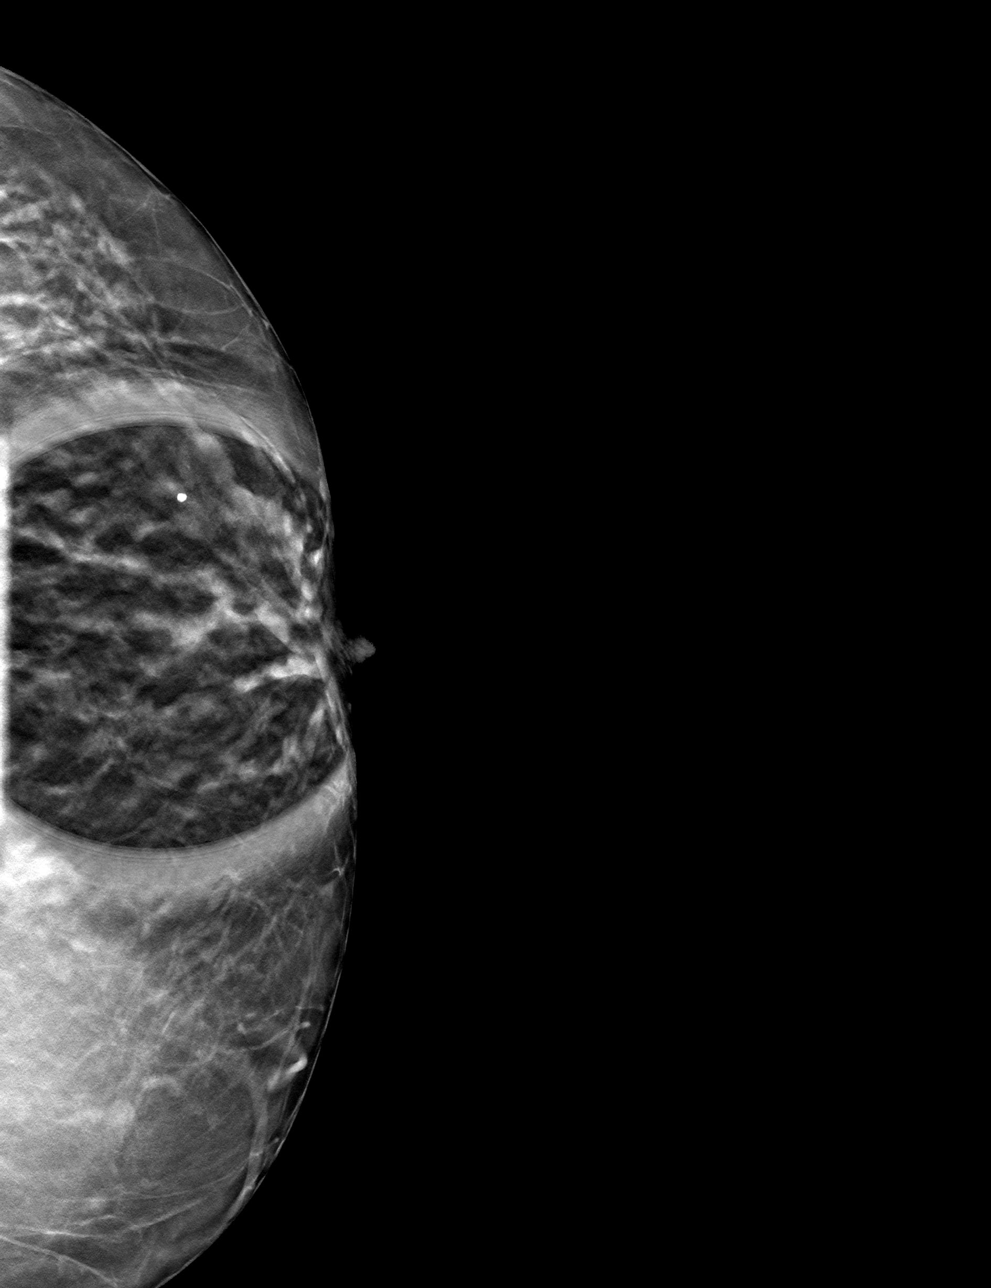

[L MLO tomo · tomo slice 27/54.0]
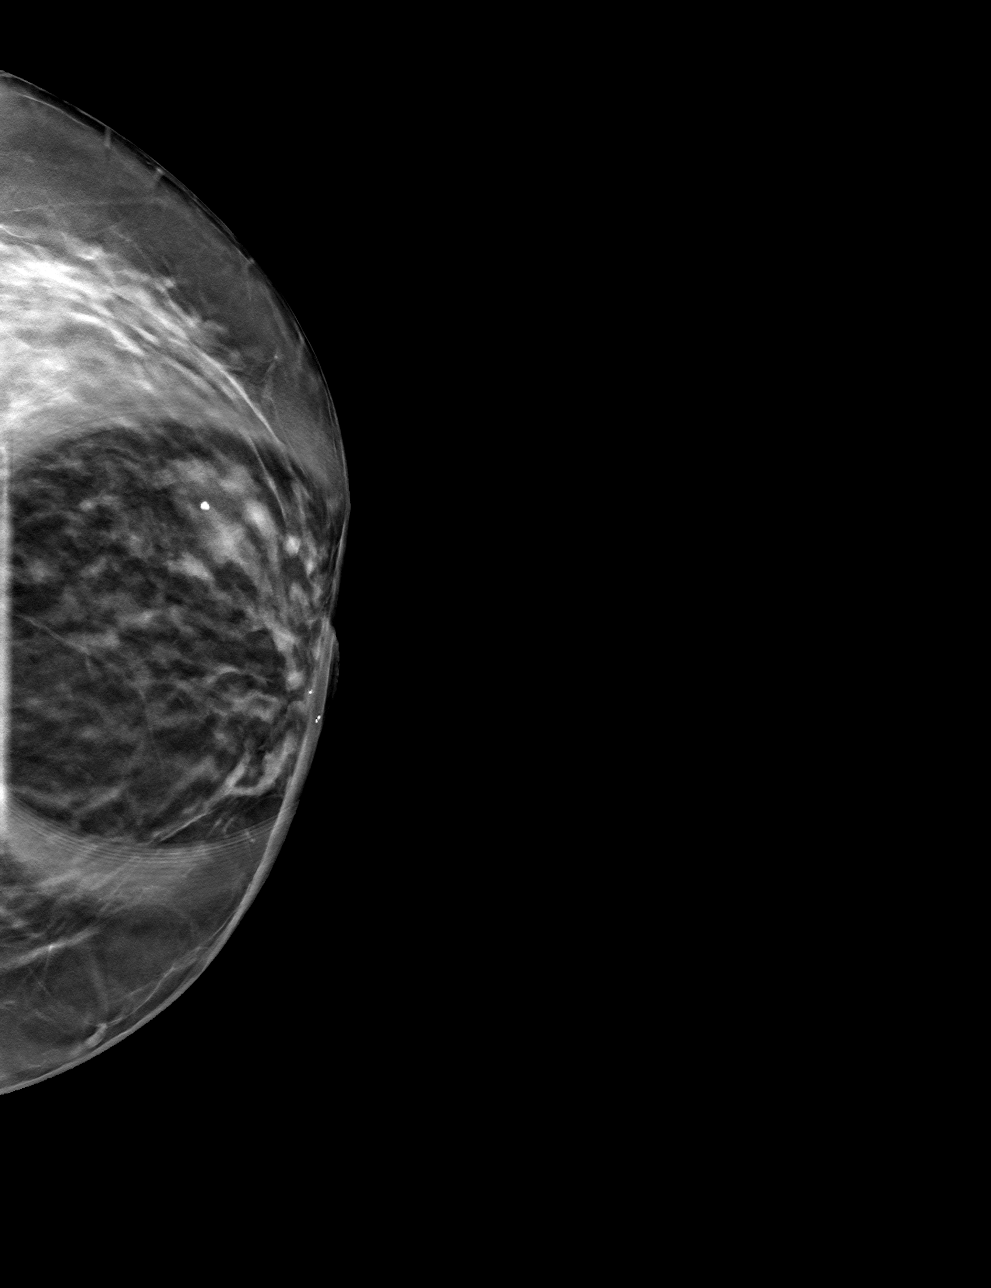

[4 of 12 positions shown; findings below may reference images not displayed]

ACR Breast Density Category b: There are scattered areas of
fibroglandular density.
FINDINGS: Spot compression CC and MLO views of the left breast were obtained
demonstrating an obscured mass within the upper-outer left breast
anterior depth.

Mammographic images were processed with CAD.

On physical exam, I palpate no discrete mass within the upper-outer
left breast.

Targeted ultrasound is performed, showing a 1.5 x 0.5 x 2.0 cm oval
circumscribed hypoechoic mass left breast 2 o'clock position 1 cm
from the nipple.
IMPRESSION: Probably benign left breast mass, favored to represent a
fibroadenoma.

RECOMMENDATION:
Left breast ultrasound in 6 months to ensure stability of probably
benign left breast mass.

I have discussed the findings and recommendations with the patient.
Results were also provided in writing at the conclusion of the
visit. If applicable, a reminder letter will be sent to the patient
regarding the next appointment.

BI-RADS CATEGORY  3: Probably benign.

## 2020-03-20 ENCOUNTER — Encounter (INDEPENDENT_AMBULATORY_CARE_PROVIDER_SITE_OTHER): Payer: BC Managed Care – PPO | Admitting: Ophthalmology

## 2020-05-09 ENCOUNTER — Other Ambulatory Visit: Payer: Self-pay | Admitting: Internal Medicine

## 2020-05-09 DIAGNOSIS — N6320 Unspecified lump in the left breast, unspecified quadrant: Secondary | ICD-10-CM

## 2020-06-19 ENCOUNTER — Ambulatory Visit
Admission: RE | Admit: 2020-06-19 | Discharge: 2020-06-19 | Disposition: A | Discharge status: Home / Self Care | Payer: Medicare Other | Source: Ambulatory Visit | Attending: Internal Medicine | Admitting: Internal Medicine

## 2020-06-19 ENCOUNTER — Other Ambulatory Visit: Payer: Self-pay

## 2020-06-19 ENCOUNTER — Other Ambulatory Visit: Payer: Self-pay | Admitting: Internal Medicine

## 2020-06-19 DIAGNOSIS — N6320 Unspecified lump in the left breast, unspecified quadrant: Secondary | ICD-10-CM

## 2020-06-25 ENCOUNTER — Other Ambulatory Visit: Payer: Self-pay

## 2020-06-25 DIAGNOSIS — M25519 Pain in unspecified shoulder: Secondary | ICD-10-CM

## 2020-06-25 DIAGNOSIS — E119 Type 2 diabetes mellitus without complications: Secondary | ICD-10-CM | POA: Insufficient documentation

## 2020-06-25 DIAGNOSIS — I10 Essential (primary) hypertension: Secondary | ICD-10-CM | POA: Insufficient documentation

## 2020-06-25 DIAGNOSIS — G4733 Obstructive sleep apnea (adult) (pediatric): Secondary | ICD-10-CM | POA: Insufficient documentation

## 2020-06-25 DIAGNOSIS — B001 Herpesviral vesicular dermatitis: Secondary | ICD-10-CM | POA: Insufficient documentation

## 2020-06-25 DIAGNOSIS — E559 Vitamin D deficiency, unspecified: Secondary | ICD-10-CM | POA: Insufficient documentation

## 2020-06-25 DIAGNOSIS — J309 Allergic rhinitis, unspecified: Secondary | ICD-10-CM | POA: Insufficient documentation

## 2020-06-25 DIAGNOSIS — M75101 Unspecified rotator cuff tear or rupture of right shoulder, not specified as traumatic: Secondary | ICD-10-CM

## 2020-06-25 DIAGNOSIS — R000 Tachycardia, unspecified: Secondary | ICD-10-CM | POA: Insufficient documentation

## 2020-06-25 DIAGNOSIS — Z780 Asymptomatic menopausal state: Secondary | ICD-10-CM | POA: Insufficient documentation

## 2020-06-25 DIAGNOSIS — K219 Gastro-esophageal reflux disease without esophagitis: Secondary | ICD-10-CM | POA: Insufficient documentation

## 2020-06-25 DIAGNOSIS — G5602 Carpal tunnel syndrome, left upper limb: Secondary | ICD-10-CM

## 2020-06-25 DIAGNOSIS — J40 Bronchitis, not specified as acute or chronic: Secondary | ICD-10-CM | POA: Insufficient documentation

## 2020-06-25 DIAGNOSIS — H6690 Otitis media, unspecified, unspecified ear: Secondary | ICD-10-CM | POA: Insufficient documentation

## 2020-06-25 DIAGNOSIS — G4730 Sleep apnea, unspecified: Secondary | ICD-10-CM | POA: Insufficient documentation

## 2020-06-25 DIAGNOSIS — E785 Hyperlipidemia, unspecified: Secondary | ICD-10-CM | POA: Insufficient documentation

## 2020-06-25 DIAGNOSIS — M1990 Unspecified osteoarthritis, unspecified site: Secondary | ICD-10-CM | POA: Insufficient documentation

## 2020-06-25 DIAGNOSIS — R252 Cramp and spasm: Secondary | ICD-10-CM | POA: Insufficient documentation

## 2020-06-25 DIAGNOSIS — R0789 Other chest pain: Secondary | ICD-10-CM | POA: Insufficient documentation

## 2020-06-25 DIAGNOSIS — G8929 Other chronic pain: Secondary | ICD-10-CM | POA: Insufficient documentation

## 2020-06-25 DIAGNOSIS — L659 Nonscarring hair loss, unspecified: Secondary | ICD-10-CM | POA: Insufficient documentation

## 2020-06-25 DIAGNOSIS — W57XXXA Bitten or stung by nonvenomous insect and other nonvenomous arthropods, initial encounter: Secondary | ICD-10-CM | POA: Insufficient documentation

## 2020-06-25 DIAGNOSIS — M7710 Lateral epicondylitis, unspecified elbow: Secondary | ICD-10-CM | POA: Insufficient documentation

## 2020-06-25 DIAGNOSIS — Z79899 Other long term (current) drug therapy: Secondary | ICD-10-CM | POA: Insufficient documentation

## 2020-06-25 DIAGNOSIS — D229 Melanocytic nevi, unspecified: Secondary | ICD-10-CM | POA: Insufficient documentation

## 2020-06-25 DIAGNOSIS — M940 Chondrocostal junction syndrome [Tietze]: Secondary | ICD-10-CM | POA: Insufficient documentation

## 2020-06-25 DIAGNOSIS — R142 Eructation: Secondary | ICD-10-CM | POA: Insufficient documentation

## 2020-06-25 DIAGNOSIS — L84 Corns and callosities: Secondary | ICD-10-CM | POA: Insufficient documentation

## 2020-06-25 HISTORY — DX: Unspecified rotator cuff tear or rupture of right shoulder, not specified as traumatic: M75.101

## 2020-06-25 HISTORY — DX: Cramp and spasm: R25.2

## 2020-06-25 HISTORY — DX: Asymptomatic menopausal state: Z78.0

## 2020-06-25 HISTORY — DX: Other long term (current) drug therapy: Z79.899

## 2020-06-25 HISTORY — DX: Corns and callosities: L84

## 2020-06-25 HISTORY — DX: Vitamin D deficiency, unspecified: E55.9

## 2020-06-25 HISTORY — DX: Pain in unspecified shoulder: M25.519

## 2020-06-25 HISTORY — DX: Hyperlipidemia, unspecified: E78.5

## 2020-06-25 HISTORY — DX: Carpal tunnel syndrome, left upper limb: G56.02

## 2020-06-25 HISTORY — DX: Lateral epicondylitis, unspecified elbow: M77.10

## 2020-06-25 HISTORY — DX: Chondrocostal junction syndrome (tietze): M94.0

## 2020-06-25 HISTORY — DX: Eructation: R14.2

## 2020-06-25 HISTORY — DX: Nonscarring hair loss, unspecified: L65.9

## 2020-06-25 HISTORY — DX: Melanocytic nevi, unspecified: D22.9

## 2020-06-26 ENCOUNTER — Ambulatory Visit (INDEPENDENT_AMBULATORY_CARE_PROVIDER_SITE_OTHER): Payer: Medicare Other | Admitting: Cardiology

## 2020-06-26 ENCOUNTER — Other Ambulatory Visit: Payer: Self-pay

## 2020-06-26 ENCOUNTER — Encounter: Payer: Self-pay | Admitting: Cardiology

## 2020-06-26 VITALS — BP 118/72 | HR 72 | Ht 59.0 in | Wt 146.8 lb

## 2020-06-26 DIAGNOSIS — E782 Mixed hyperlipidemia: Secondary | ICD-10-CM

## 2020-06-26 DIAGNOSIS — E119 Type 2 diabetes mellitus without complications: Secondary | ICD-10-CM | POA: Diagnosis not present

## 2020-06-26 DIAGNOSIS — G4730 Sleep apnea, unspecified: Secondary | ICD-10-CM

## 2020-06-26 DIAGNOSIS — I10 Essential (primary) hypertension: Secondary | ICD-10-CM | POA: Diagnosis not present

## 2020-06-26 NOTE — Progress Notes (Signed)
Cardiology Office Note:    Date:  06/26/2020   ID:  Mindy Ingram, DOB [birth year], MRN [id number]  PCP:  Mindy Huddle, MD  Cardiologist:  Mindy Lindau, MD   Referring MD: Mindy Huddle, MD    ASSESSMENT:    1. Essential hypertension   2. Type 2 diabetes mellitus without complication, without long-term current use of insulin ([place])   3. Sleep apnea, unspecified type   4. Mixed hyperlipidemia    PLAN:    In order of problems listed above:  1. Primary prevention stressed with the patient.  Importance of compliance with diet medication stressed and she vocalized understanding.  She leads a sedentary lifestyle but plans to start walking on a regular basis.  She is asymptomatic. 2. Essential hypertension: Blood pressure stable and diet was emphasized. 3. Diabetes mellitus: Stable.  Hemoglobin A1c is 7 and she tells me that it is on a downward trend.  She is doing her best with the assistance of her doctor to get this number lower.  At this time to be more compliant. 4. Mixed dyslipidemia: On statin therapy and lipids are fine.  I reviewed them from the [facility] sheet. 5. Sleep apnea: Sleep health issues were discussed. 6. Patient will be seen in follow-up appointment in 6 months or earlier if the patient has any concerns    Medication Adjustments/Labs and Tests Ordered: Current medicines are reviewed at length with the patient today.  Concerns regarding medicines are outlined above.  Orders Placed This Encounter  Procedures  . EKG 12-Lead   No orders of the defined types were placed in this encounter.    No chief complaint on file.    History of Present Illness:    Mindy Ingram is a 74 y.o. female.  Patient has past medical history of essential hypertension dyslipidemia and sleep apnea.  She is a diabetic.  She denies any problems at this time and takes care of activities of daily living.  No chest pain orthopnea or PND.  She leads a sedentary lifestyle because of  orthopedic issues and arthritis.  At the time of my evaluation, the patient is alert awake oriented and in no distress.  Past Medical History:  Diagnosis Date  . Allergic rhinitis   . Asthma   . Bronchitis    none in years  . DJD (degenerative joint disease)   . Fibromyalgia   . GERD (gastroesophageal reflux disease)   . Hypertension   . OSA (obstructive sleep apnea)    cpap setting of 10  . Otitis media    both ears with impacted cerumem  . Rapid heartbeat    no metoprolol lin pm  . Sleep apnea   . Type II or unspecified type diabetes mellitus without mention of complication, not stated as uncontrolled     Past Surgical History:  Procedure Laterality Date  . ABDOMINAL HYSTERECTOMY    . APPENDECTOMY    . BACK SURGERY  2013   lower back with plates and screws  . benign lump right axilla    . [place]   at [facility], slight limitation with turning neck  . CHOLECYSTECTOMY    . EUS N/A 07/21/2013   Procedure: UPPER ENDOSCOPIC ULTRASOUND (EUS) LINEAR;  Surgeon: Milus Banister, MD;  Location: [facility] ENDOSCOPY;  Service: Endoscopy;  Laterality: N/A;  . NEUROPLASTY / TRANSPOSITION MEDIAN NERVE AT [place]  . PARTIAL COLECTOMY  1990   benign adhesions  .  right foot surgery  2000   right great toe with artificial bone inserted  . right knee arthroscopy    . tah and bso      Current Medications: Current Meds  Medication Sig  . albuterol (PROVENTIL HFA;VENTOLIN HFA) 108 (90 Base) MCG/ACT inhaler Inhale 2 puffs every 4-6 hours as needed  . amLODipine (NORVASC) 5 MG tablet Take 1.5 tablets (7.5 mg total) by mouth daily.  Marland Kitchen aspirin EC 81 MG tablet Take 81 mg by mouth daily.  Marland Kitchen azelastine (ASTELIN) 0.1 % nasal spray Place 1 spray into the nose 2 (two) times daily.  . carisoprodol (SOMA) 350 MG tablet Take by mouth.  . cetirizine (ZYRTEC) 10 MG tablet Take 1 tablet by mouth daily.  . Cholecalciferol (VITAMIN D PO) Take 2,000 Units by mouth daily.    Lucila Maine 575 MG/5ML SYRP Take 5 mLs by mouth daily.  . fluticasone (FLONASE) 50 MCG/ACT nasal spray 1-2 puffs each nostril once daily  . Lancets (ONETOUCH DELICA PLUS LANCET33G) MISC CHECK BLOOD SUGAR TWICE DAILY PRIOR TO MEAL  . losartan-hydrochlorothiazide (HYZAAR) 50-12.5 MG tablet Take 1 tablet by mouth daily.  . meloxicam (MOBIC) 7.5 MG tablet Take 7.5 mg by mouth 2 (two) times a week.   . metFORMIN (GLUCOPHAGE-XR) 500 MG 24 hr tablet Take 1,000 mg by mouth daily.  . methocarbamol (ROBAXIN) 500 MG tablet Take 500-1,000 mg by mouth at bedtime as needed.  . metoprolol (TOPROL-XL) 200 MG 24 hr tablet Take 200 mg by mouth daily.  . Multiple Vitamins-Minerals (COMPLETE ENERGY) TABS Take 1 tablet by mouth daily.  . nitroGLYCERIN (NITROSTAT) 0.4 MG SL tablet Place 1 tablet (0.4 mg total) under the tongue every 5 (five) minutes as needed for chest pain.  . Omega-3 Fatty Acids (FISH OIL) 1000 MG CAPS Take 1 capsule by mouth daily.  Marland Kitchen omeprazole (PRILOSEC) 20 MG capsule Take 1 capsule by mouth daily.  Letta Pate VERIO test strip TEST BLOOD SUGAR TWICE DAILY PRIOR TO MEALS  . rosuvastatin (CRESTOR) 10 MG tablet Take 10 mg by mouth 2 (two) times a week.  . traMADol (ULTRAM) 50 MG tablet Take 50 mg by mouth 2 (two) times daily.  Marland Kitchen triamcinolone (KENALOG) 0.025 % ointment Apply 1 application topically 2 (two) times daily.  . valACYclovir (VALTREX) 1000 MG tablet 1 tablet  . valsartan-hydrochlorothiazide (DIOVAN-HCT) 160-12.5 MG tablet Take 1 tablet by mouth daily.     Allergies:   Dairy aid [lactase], Lactose, Anoro ellipta [umeclidinium-vilanterol], Atorvastatin, Compazine [prochlorperazine edisylate], Hydrocodone-acetaminophen, Hydrocodone-acetaminophen, Milk-related compounds, Myrbetriq [mirabegron], Oxycontin [oxycodone hcl], Prochlorperazine, Prochlorperazine edisylate, Rosuvastatin, Shellfish allergy, Shellfish-derived products, and Percodan [oxycodone-aspirin]   Social History    Socioeconomic History  . Marital status: Widowed    Spouse name: Not on file  . Number of children: 1  . Years of education: Not on file  . Highest education level: Not on file  Occupational History  . Not on file  Tobacco Use  . Smoking status: Never Smoker  . Smokeless tobacco: Never Used  Substance and Sexual Activity  . Alcohol use: No  . Drug use: No  . Sexual activity: Not on file  Other Topics Concern  . Not on file  Social History Narrative  . Not on file   Social Determinants of Health   Financial Resource Strain: Not on file  Food Insecurity: Not on file  Transportation Needs: Not on file  Physical Activity: Not on file  Stress: Not on file  Social Connections: Not on file  Family History: The patient's family history includes Breast cancer in her maternal aunt. There is no history of Allergies, Asthma, Eczema, or Immunodeficiency.  ROS:   Please see the history of present illness.    All other systems reviewed and are negative.  EKGs/Labs/Other Studies Reviewed:    The following studies were reviewed today: I discussed my findings with the patient at length.  EKG reveals sinus rhythm and nonspecific ST-T changes.   Recent Labs: No results found for requested labs within last 8760 hours.  Recent Lipid Panel No results found for: CHOL, TRIG, HDL, CHOLHDL, VLDL, LDLCALC, LDLDIRECT  Physical Exam:    VS:  BP 118/72   Pulse 72   Ht 4\' 11"  (1.499 m)   Wt 146 lb 12.8 oz (66.6 kg)   SpO2 94%   BMI 29.65 kg/m     Wt Readings from Last 3 Encounters:  06/26/20 146 lb 12.8 oz (66.6 kg)  12/20/19 149 lb (67.6 kg)  05/10/19 146 lb 3.2 oz (66.3 kg)     GEN: Patient is in no acute distress HEENT: Normal NECK: No JVD; No carotid bruits LYMPHATICS: No lymphadenopathy CARDIAC: Hear sounds regular, 2/6 systolic murmur at the apex. RESPIRATORY:  Clear to auscultation without rales, wheezing or rhonchi  ABDOMEN: Soft, non-tender,  non-distended MUSCULOSKELETAL:  No edema; No deformity  SKIN: Warm and dry NEUROLOGIC:  Alert and oriented x 3 PSYCHIATRIC:  Normal affect   Signed, Mindy Lindau, MD  06/26/2020 3:27 PM    [place]

## 2020-06-26 NOTE — Patient Instructions (Signed)

## 2020-07-03 DIAGNOSIS — M5136 Other intervertebral disc degeneration, lumbar region: Secondary | ICD-10-CM | POA: Diagnosis not present

## 2020-07-03 DIAGNOSIS — M2550 Pain in unspecified joint: Secondary | ICD-10-CM | POA: Diagnosis not present

## 2020-07-03 DIAGNOSIS — M7989 Other specified soft tissue disorders: Secondary | ICD-10-CM | POA: Diagnosis not present

## 2020-07-03 DIAGNOSIS — M797 Fibromyalgia: Secondary | ICD-10-CM | POA: Diagnosis not present

## 2020-07-03 DIAGNOSIS — M25521 Pain in right elbow: Secondary | ICD-10-CM | POA: Diagnosis not present

## 2020-07-03 DIAGNOSIS — M150 Primary generalized (osteo)arthritis: Secondary | ICD-10-CM | POA: Diagnosis not present

## 2020-07-24 DIAGNOSIS — M25571 Pain in right ankle and joints of right foot: Secondary | ICD-10-CM | POA: Diagnosis not present

## 2020-07-24 DIAGNOSIS — I10 Essential (primary) hypertension: Secondary | ICD-10-CM | POA: Diagnosis not present

## 2020-07-24 DIAGNOSIS — J45909 Unspecified asthma, uncomplicated: Secondary | ICD-10-CM | POA: Diagnosis not present

## 2020-07-24 DIAGNOSIS — E119 Type 2 diabetes mellitus without complications: Secondary | ICD-10-CM | POA: Diagnosis not present

## 2020-07-24 DIAGNOSIS — K219 Gastro-esophageal reflux disease without esophagitis: Secondary | ICD-10-CM | POA: Diagnosis not present

## 2020-07-24 DIAGNOSIS — E785 Hyperlipidemia, unspecified: Secondary | ICD-10-CM | POA: Diagnosis not present

## 2020-07-24 DIAGNOSIS — M1990 Unspecified osteoarthritis, unspecified site: Secondary | ICD-10-CM | POA: Diagnosis not present

## 2020-07-24 DIAGNOSIS — M25471 Effusion, right ankle: Secondary | ICD-10-CM | POA: Diagnosis not present

## 2020-08-06 DIAGNOSIS — H9202 Otalgia, left ear: Secondary | ICD-10-CM | POA: Diagnosis not present

## 2020-08-06 DIAGNOSIS — H6121 Impacted cerumen, right ear: Secondary | ICD-10-CM | POA: Diagnosis not present

## 2020-08-06 DIAGNOSIS — H9201 Otalgia, right ear: Secondary | ICD-10-CM | POA: Diagnosis not present

## 2020-08-10 DIAGNOSIS — E119 Type 2 diabetes mellitus without complications: Secondary | ICD-10-CM | POA: Diagnosis not present

## 2020-08-10 DIAGNOSIS — E785 Hyperlipidemia, unspecified: Secondary | ICD-10-CM | POA: Diagnosis not present

## 2020-08-10 DIAGNOSIS — K219 Gastro-esophageal reflux disease without esophagitis: Secondary | ICD-10-CM | POA: Diagnosis not present

## 2020-08-10 DIAGNOSIS — J45909 Unspecified asthma, uncomplicated: Secondary | ICD-10-CM | POA: Diagnosis not present

## 2020-08-10 DIAGNOSIS — I10 Essential (primary) hypertension: Secondary | ICD-10-CM | POA: Diagnosis not present

## 2020-08-24 DIAGNOSIS — G4733 Obstructive sleep apnea (adult) (pediatric): Secondary | ICD-10-CM | POA: Diagnosis not present

## 2020-09-03 DIAGNOSIS — H9319 Tinnitus, unspecified ear: Secondary | ICD-10-CM | POA: Diagnosis not present

## 2020-09-03 DIAGNOSIS — H6121 Impacted cerumen, right ear: Secondary | ICD-10-CM | POA: Diagnosis not present

## 2020-09-03 DIAGNOSIS — H903 Sensorineural hearing loss, bilateral: Secondary | ICD-10-CM | POA: Diagnosis not present

## 2020-09-03 DIAGNOSIS — H9190 Unspecified hearing loss, unspecified ear: Secondary | ICD-10-CM | POA: Diagnosis not present

## 2020-09-03 DIAGNOSIS — H61303 Acquired stenosis of external ear canal, unspecified, bilateral: Secondary | ICD-10-CM | POA: Diagnosis not present

## 2020-09-17 NOTE — Progress Notes (Signed)
New Patient Office Visit  Subjective:  Patient ID: Mindy Ingram, female    DOB: June 01, 1946  Age: 75 y.o. MRN: [id number]  CC:  Chief Complaint  Patient presents with  . Diabetes  . Hypertension  . Gastroesophageal Reflux    HPI Mindy Ingram presents to establish care. Diabetes:A1C was 7 in November. On metformin. Tries to eat healthy. Checks sugars twice a day. Usually in the 120s.   Right hearing loss: Has seen Dr. Gaylyn Cheers.  Hyperlipidemia: on Crestor 2 x week. Intolerant to generic crestor caused myalgia. When she was taking brand name crestor, she did not seem to have muscle pain. OTC fish oil 1000 mg once daily.  Joint pain: Saw Dr. Amil Amen one month ago. Evaluating joint pain. Labs per patient were slightly abnormal. Not enough to diagnose RA, but he wishes to see her back in 6 months. Took meloxicam and helped joint pain, but caused elevated bp. Stopped meloxicam 3 weeks ago. Her bp dropped after stopping meloxicam. Tramadol 50 mg one twice a day prn severe joint pain (takes maybe taken 1-2 times in 3 months.   GERD: omeprazole 20 mg once daily. .   Right ankle/foot - swelling and pain about 3 weeks ago to urgent care. Gave her a kenalog shot and a pain shot. Resolved in 3 days. Was not hot or red. Excruciating pain.   Sees Dr.Young for allergies, asthma, and OSA (Autotitrate - setting of 10.)  Asthma: Mild intermittent. Uses albuterol inhaler prn.  Never smoked.    Past Medical History:  Diagnosis Date  . Allergic rhinitis   . Asthma   . Bronchitis    none in years  . DJD (degenerative joint disease)   . Fibromyalgia   . GERD (gastroesophageal reflux disease)   . Hypertension   . OSA (obstructive sleep apnea)    cpap setting of 10  . Otitis media    both ears with impacted cerumem  . Rapid heartbeat    no metoprolol lin pm  . Sleep apnea   . Type II or unspecified type diabetes mellitus without mention of complication, not stated as uncontrolled      Past Surgical History:  Procedure Laterality Date  . APPENDECTOMY  1995  . BACK SURGERY  2013   lower back with plates and screws  . benign lump right axilla    . BILATERAL SALPINGOOPHORECTOMY  1995  . [facility]   at [facility], slight limitation with turning neck  . CHOLECYSTECTOMY  1995  . EUS N/A 07/21/2013   Procedure: UPPER ENDOSCOPIC ULTRASOUND (EUS) LINEAR;  Surgeon: Mindy Banister, MD;  Location: [facility];  Service: Endoscopy;  Laterality: N/A;  . FOOT SURGERY     2007 - right great toe, 2000 - right fifth digit .  Marland Kitchen NEUROPLASTY / TRANSPOSITION MEDIAN NERVE AT CARPAL TUNNEL BILATERAL  1995  . PARTIAL COLECTOMY  1995   benign adhesions  . PARTIAL HYSTERECTOMY  1980s.   abdominal  . right foot surgery  2000   right great toe with artificial bone inserted  . right knee arthroscopy Right 02/2017    Family History  Problem Relation Age of Onset  . Breast cancer Maternal Aunt   . Allergies Neg Hx   . Asthma Neg Hx   . Eczema Neg Hx   . Immunodeficiency Neg Hx     Social History   Socioeconomic History  . Marital status: Widowed    Spouse name: Not on  file  . Number of children: 1  . Years of education: Not on file  . Highest education level: Not on file  Occupational History  . Not on file  Tobacco Use  . Smoking status: Never Smoker  . Smokeless tobacco: Never Used  Substance and Sexual Activity  . Alcohol use: No  . Drug use: No  . Sexual activity: Not on file  Other Topics Concern  . Not on file  Social History Narrative  . Not on file   Social Determinants of Health   Financial Resource Strain: Not on file  Food Insecurity: Not on file  Transportation Needs: Not on file  Physical Activity: Not on file  Stress: Not on file  Social Connections: Not on file  Intimate Partner Violence: Not on file    ROS Review of Systems  Constitutional: Negative for chills, fatigue and fever.  HENT: Positive for ear pain (recently seen  by Dr. Gaylyn Cheers). Negative for congestion, rhinorrhea and sore throat.   Respiratory: Negative for cough and shortness of breath.   Cardiovascular: Negative for chest pain.  Gastrointestinal: Negative for abdominal pain, constipation, diarrhea, nausea and vomiting.  Endocrine: Negative for polydipsia, polyphagia and polyuria.  Genitourinary: Negative for dysuria and urgency.  Musculoskeletal: Positive for arthralgias (bilateral knee pain ), back pain, myalgias and neck pain.  Neurological: Positive for light-headedness. Negative for dizziness, weakness and headaches.  Psychiatric/Behavioral: Negative for dysphoric mood. The patient is not nervous/anxious.     Objective:   Today's Vitals: BP 104/60   Pulse 78   Temp (!) 97.4 F (36.3 C)   Resp 18   Ht 4\' 10"  (1.473 m)   Wt 147 lb 9.6 oz (67 kg)   BMI 30.85 kg/m   Physical Exam Vitals reviewed.  Constitutional:      Appearance: Normal appearance. She is normal weight.  Neck:     Vascular: No carotid bruit.  Cardiovascular:     Rate and Rhythm: Normal rate and regular rhythm.     Pulses: Normal pulses.     Heart sounds: Normal heart sounds.  Pulmonary:     Effort: Pulmonary effort is normal. No respiratory distress.     Breath sounds: Normal breath sounds.  Abdominal:     General: Abdomen is flat. Bowel sounds are normal.     Palpations: Abdomen is soft.     Tenderness: There is no abdominal tenderness.  Neurological:     Mental Status: She is alert and oriented to person, place, and time.  Psychiatric:        Mood and Affect: Mood normal.        Behavior: Behavior normal.     Assessment & Plan:  1. Essential hypertension Improved having discontinued meloxicam.  The current medical regimen is effective;  continue present plan and medications. Return for labs.  - Lipid panel; Future - Comprehensive metabolic panel; Future - TSH; Future  2. OSA (obstructive sleep apnea) Mgmt per Dr. Annamaria Boots  3. Gastro-esophageal reflux  disease without esophagitis The current medical regimen is effective;  continue present plan and medications.  4. DM type 2 with diabetic mixed hyperlipidemia (HCC) Control: good Recommend check sugars fasting daily. Recommend check feet daily. Recommend annual eye exams. Medicines: no changes Continue to work on eating a healthy diet and exercise.  Pt to return for labwork.  Will need microalbumin at next visit.  - CBC with Differential/Platelet; Future - Hemoglobin A1c; Future  5. Mixed hyperlipidemia Continue brand name crestor twice a  Week Low fat diet and exercise.  - FLP, Future  6. BMI 30.0-30.9,adult Recommend continue to work on eating healthy diet and exercise.  7. Non-seasonal allergic rhinitis due to other allergic trigger The current medical regimen is effective;  continue present plan and medications.  8. Allergic asthma, mild intermittent, uncomplicated The current medical regimen is effective;  continue present plan and medications. Management per specialist. Dr Annamaria Boots.    Outpatient Encounter Medications as of 09/18/2020  Medication Sig  . albuterol (PROVENTIL HFA;VENTOLIN HFA) 108 (90 Base) MCG/ACT inhaler Inhale 2 puffs every 4-6 hours as needed  . aspirin EC 81 MG tablet Take 81 mg by mouth daily.  Marland Kitchen azelastine (ASTELIN) 0.1 % nasal spray Place 1 spray into the nose 2 (two) times daily.  . cetirizine (ZYRTEC) 10 MG tablet Take 1 tablet by mouth daily.  . Cholecalciferol (VITAMIN D PO) Take 2,000 Units by mouth daily.   Kendall Flack 575 MG/5ML SYRP Take 5 mLs by mouth daily.  . fluticasone (FLONASE) 50 MCG/ACT nasal spray 1-2 puffs each nostril once daily  . Inulin (FIBER CHOICE PO) Take by mouth.  . Lancets (ONETOUCH DELICA PLUS [id number]) MISC CHECK BLOOD SUGAR TWICE DAILY PRIOR TO MEAL  . metFORMIN (GLUCOPHAGE-XR) 500 MG 24 hr tablet Take 1,000 mg by mouth daily.  . metoprolol (TOPROL-XL) 200 MG 24 hr tablet Take 200 mg by mouth daily.  . Multiple  Vitamins-Minerals (COMPLETE ENERGY) TABS Take 1 tablet by mouth daily.  . nitroGLYCERIN (NITROSTAT) 0.4 MG SL tablet Place 1 tablet (0.4 mg total) under the tongue every 5 (five) minutes as needed for chest pain.  . Omega-3 Fatty Acids (FISH OIL) 1000 MG CAPS Take 1 capsule by mouth daily.  Marland Kitchen omeprazole (PRILOSEC) 20 MG capsule Take 1 capsule by mouth daily.  Glory Rosebush VERIO test strip TEST BLOOD SUGAR TWICE DAILY PRIOR TO MEALS  . rosuvastatin (CRESTOR) 10 MG tablet Take 10 mg by mouth 2 (two) times a week.  . traMADol (ULTRAM) 50 MG tablet Take 50 mg by mouth 2 (two) times daily.  Marland Kitchen triamcinolone (KENALOG) 0.025 % ointment Apply 1 application topically 2 (two) times daily.  . valACYclovir (VALTREX) 1000 MG tablet Take 1,000 mg by mouth daily as needed.  . [DISCONTINUED] losartan-hydrochlorothiazide (HYZAAR) 50-12.5 MG tablet Take 1 tablet by mouth daily.  . [DISCONTINUED] meloxicam (MOBIC) 7.5 MG tablet Take 7.5 mg by mouth 2 (two) times a week.   Marland Kitchen amLODipine (NORVASC) 5 MG tablet Take 0.5 tablets (2.5 mg total) by mouth daily.  . [DISCONTINUED] amLODipine (NORVASC) 5 MG tablet Take 1.5 tablets (7.5 mg total) by mouth daily. (Patient taking differently: Take 2.5 mg by mouth daily.)  . [DISCONTINUED] carisoprodol (SOMA) 350 MG tablet Take by mouth.  . [DISCONTINUED] methocarbamol (ROBAXIN) 500 MG tablet Take 500-1,000 mg by mouth at bedtime as needed.  . [DISCONTINUED] valsartan-hydrochlorothiazide (DIOVAN-HCT) 160-12.5 MG tablet Take 1 tablet by mouth daily.   No facility-administered encounter medications on file as of 09/18/2020.    Follow-up: No follow-ups on file.   [name], MD

## 2020-09-18 ENCOUNTER — Encounter: Payer: Self-pay | Admitting: Family Medicine

## 2020-09-18 ENCOUNTER — Ambulatory Visit (INDEPENDENT_AMBULATORY_CARE_PROVIDER_SITE_OTHER): Payer: Medicare Other | Admitting: Family Medicine

## 2020-09-18 ENCOUNTER — Other Ambulatory Visit: Payer: Self-pay

## 2020-09-18 VITALS — BP 104/60 | HR 78 | Temp 97.4°F | Resp 18 | Ht <= 58 in | Wt 147.6 lb

## 2020-09-18 DIAGNOSIS — E1169 Type 2 diabetes mellitus with other specified complication: Secondary | ICD-10-CM

## 2020-09-18 DIAGNOSIS — J4520 Mild intermittent asthma, uncomplicated: Secondary | ICD-10-CM | POA: Diagnosis not present

## 2020-09-18 DIAGNOSIS — K219 Gastro-esophageal reflux disease without esophagitis: Secondary | ICD-10-CM | POA: Diagnosis not present

## 2020-09-18 DIAGNOSIS — G4733 Obstructive sleep apnea (adult) (pediatric): Secondary | ICD-10-CM

## 2020-09-18 DIAGNOSIS — J3089 Other allergic rhinitis: Secondary | ICD-10-CM | POA: Diagnosis not present

## 2020-09-18 DIAGNOSIS — E782 Mixed hyperlipidemia: Secondary | ICD-10-CM | POA: Diagnosis not present

## 2020-09-18 DIAGNOSIS — I10 Essential (primary) hypertension: Secondary | ICD-10-CM | POA: Diagnosis not present

## 2020-09-18 DIAGNOSIS — E119 Type 2 diabetes mellitus without complications: Secondary | ICD-10-CM

## 2020-09-18 DIAGNOSIS — Z6830 Body mass index (BMI) 30.0-30.9, adult: Secondary | ICD-10-CM

## 2020-09-20 ENCOUNTER — Other Ambulatory Visit: Payer: Self-pay

## 2020-09-20 ENCOUNTER — Other Ambulatory Visit: Payer: BC Managed Care – PPO

## 2020-09-20 DIAGNOSIS — E119 Type 2 diabetes mellitus without complications: Secondary | ICD-10-CM

## 2020-09-20 DIAGNOSIS — I10 Essential (primary) hypertension: Secondary | ICD-10-CM | POA: Diagnosis not present

## 2020-09-21 DIAGNOSIS — G4733 Obstructive sleep apnea (adult) (pediatric): Secondary | ICD-10-CM | POA: Diagnosis not present

## 2020-09-21 LAB — CBC WITH DIFFERENTIAL/PLATELET
Basophils Absolute: 0 10*3/uL (ref 0.0–0.2)
Basos: 1 %
EOS (ABSOLUTE): 0.1 10*3/uL (ref 0.0–0.4)
Eos: 1 %
Hematocrit: 40 % (ref 34.0–46.6)
Hemoglobin: 13.4 g/dL (ref 11.1–15.9)
Immature Grans (Abs): 0 10*3/uL (ref 0.0–0.1)
Immature Granulocytes: 0 %
Lymphocytes Absolute: 1.9 10*3/uL (ref 0.7–3.1)
Lymphs: 41 %
MCH: 30.7 pg (ref 26.6–33.0)
MCHC: 33.5 g/dL (ref 31.5–35.7)
MCV: 92 fL (ref 79–97)
Monocytes Absolute: 0.4 10*3/uL (ref 0.1–0.9)
Monocytes: 8 %
Neutrophils Absolute: 2.3 10*3/uL (ref 1.4–7.0)
Neutrophils: 49 %
Platelets: 283 10*3/uL (ref 150–450)
RBC: 4.37 x10E6/uL (ref 3.77–5.28)
RDW: 13.1 % (ref 11.7–15.4)
WBC: 4.6 10*3/uL (ref 3.4–10.8)

## 2020-09-21 LAB — LIPID PANEL
Chol/HDL Ratio: 2.9 ratio (ref 0.0–4.4)
Cholesterol, Total: 171 mg/dL (ref 100–199)
HDL: 58 mg/dL (ref >39)
LDL Chol Calc (NIH): 95 mg/dL (ref 0–99)
Triglycerides: 102 mg/dL (ref 0–149)
VLDL Cholesterol Cal: 18 mg/dL (ref 5–40)

## 2020-09-21 LAB — COMPREHENSIVE METABOLIC PANEL
ALT: 10 IU/L (ref 0–32)
AST: 20 IU/L (ref 0–40)
Albumin/Globulin Ratio: 1.9 (ref 1.2–2.2)
Albumin: 4.5 g/dL (ref 3.7–4.7)
Alkaline Phosphatase: 45 IU/L (ref 44–121)
BUN/Creatinine Ratio: 14 (ref 12–28)
BUN: 13 mg/dL (ref 8–27)
Bilirubin Total: 0.3 mg/dL (ref 0.0–1.2)
CO2: 24 mmol/L (ref 20–29)
Calcium: 10 mg/dL (ref 8.7–10.3)
Chloride: 102 mmol/L (ref 96–106)
Creatinine, Ser: 0.9 mg/dL (ref 0.57–1.00)
Globulin, Total: 2.4 g/dL (ref 1.5–4.5)
Glucose: 109 mg/dL — ABNORMAL HIGH (ref 65–99)
Potassium: 4.2 mmol/L (ref 3.5–5.2)
Sodium: 145 mmol/L — ABNORMAL HIGH (ref 134–144)
Total Protein: 6.9 g/dL (ref 6.0–8.5)
eGFR: 67 mL/min/{1.73_m2} (ref >59)

## 2020-09-21 LAB — CARDIOVASCULAR RISK ASSESSMENT

## 2020-09-21 LAB — HEMOGLOBIN A1C
Est. average glucose Bld gHb Est-mCnc: 146 mg/dL
Hgb A1c MFr Bld: 6.7 % — ABNORMAL HIGH (ref 4.8–5.6)

## 2020-09-21 LAB — TSH: TSH: 1.45 u[IU]/mL (ref 0.450–4.500)

## 2020-10-03 DIAGNOSIS — E119 Type 2 diabetes mellitus without complications: Secondary | ICD-10-CM | POA: Diagnosis not present

## 2020-10-03 DIAGNOSIS — H2511 Age-related nuclear cataract, right eye: Secondary | ICD-10-CM | POA: Diagnosis not present

## 2020-10-06 ENCOUNTER — Other Ambulatory Visit: Payer: Self-pay | Admitting: Cardiology

## 2020-10-08 NOTE — Telephone Encounter (Signed)
Rx refill sent to pharmacy. 

## 2020-10-14 ENCOUNTER — Encounter: Payer: Self-pay | Admitting: Family Medicine

## 2020-10-14 ENCOUNTER — Other Ambulatory Visit: Payer: Self-pay | Admitting: Family Medicine

## 2020-10-14 MED ORDER — AMLODIPINE BESYLATE 5 MG PO TABS: 2.5000 mg | ORAL_TABLET | Freq: Every day | ORAL | 0 refills | Status: DC

## 2020-10-16 ENCOUNTER — Telehealth: Payer: Self-pay | Admitting: Family Medicine

## 2020-10-16 NOTE — Progress Notes (Signed)
  Chronic Care Management   Outreach Note  10/16/2020 Name: Mindy Ingram MRN: [id number] DOB: 1946/05/14  Referred by: [name], MD Reason for referral : No chief complaint on file.   An unsuccessful telephone outreach was attempted today. The patient was referred to the pharmacist for assistance with care management and care coordination.   Follow Up Plan:   Carley Perdue [employer] Scheduler

## 2020-10-16 NOTE — Progress Notes (Signed)
  Chronic Care Management   Note  10/16/2020 Name: SHARNELL KNIGHT MRN: [id number] DOB: [birth year]  JADIN KAGEL is a 75 y.o. year old female who is a primary care patient of Cox, Kirsten, MD. I reached out to [name] by phone today in response to a referral sent by Ms. Yehuda Mao PCP, [name], MD.   Ms. Morden was given information about Chronic Care Management services today including:  1. CCM service includes personalized support from designated clinical staff supervised by her physician, including individualized plan of care and coordination with other care providers 2. 24/7 contact phone numbers for assistance for urgent and routine care needs. 3. Service will only be billed when office clinical staff spend 20 minutes or more in a month to coordinate care. 4. Only one practitioner may furnish and bill the service in a calendar month. 5. The patient may stop CCM services at any time (effective at the end of the month) by phone call to the office staff.   Patient agreed to services and verbal consent obtained.   Follow up plan:   Carley Perdue [employer] Scheduler

## 2020-10-25 ENCOUNTER — Ambulatory Visit: Payer: BC Managed Care – PPO | Admitting: Internal Medicine

## 2020-11-13 ENCOUNTER — Telehealth: Payer: Self-pay

## 2020-11-13 NOTE — Progress Notes (Addendum)
Chronic Care Management Pharmacy Assistant   Name: Mindy Ingram  MRN: [id number] DOB: 06-07-1946  Mindy Ingram is an 75 y.o. year old female who presents for his initial CCM visit with the clinical pharmacist.  Reason for Encounter: Initial call questions   Conditions to be addressed/monitored: HTN, OSA, DM, Asthma, GERD, Hyperlipidemia    Recent office visits:  09/18/2020: Mindy Brome, MD (PCP) for HTN/ Discontinued Amlodipine, Carisoprodol, and Meloxicam. Completed course of Methocarbamol. Change in therapy: Discontinued Valsartan-Hydrochlorothiazide 160-12.5 mg.    Recent consult visits:  07/03/2020: Mindy Aurora, MD (Rheumatology) for joint pain / labs drawn, X-ray (R) elbow/ no medication changes noted   05/31/2020: Mindy Heinz, MD (Cardiology) for HTN/ EKG obtained / no medication changes noted    Hospital visits:  No hospital visits noted within the past 6 months   Medications: Outpatient Encounter Medications as of 11/13/2020  Medication Sig   albuterol (PROVENTIL HFA;VENTOLIN HFA) 108 (90 Base) MCG/ACT inhaler Inhale 2 puffs every 4-6 hours as needed   amLODipine (NORVASC) 5 MG tablet TAKE 1/2 TABLET BY MOUTH DAILY   aspirin EC 81 MG tablet Take 81 mg by mouth daily.   azelastine (ASTELIN) 0.1 % nasal spray Place 1 spray into the nose 2 (two) times daily.   cetirizine (ZYRTEC) 10 MG tablet Take 1 tablet by mouth daily.   Cholecalciferol (VITAMIN Ingram PO) Take 2,000 Units by mouth daily.    Elderberry 575 MG/5ML SYRP Take 5 mLs by mouth daily.   fluticasone (FLONASE) 50 MCG/ACT nasal spray 1-2 puffs each nostril once daily   Inulin (FIBER CHOICE PO) Take by mouth.   Lancets (ONETOUCH DELICA PLUS [id number]) MISC CHECK BLOOD SUGAR TWICE DAILY PRIOR TO MEAL   losartan-hydrochlorothiazide (HYZAAR) 50-12.5 MG tablet TAKE 1 TABLET BY MOUTH DAILY   metFORMIN (GLUCOPHAGE-XR) 500 MG 24 hr tablet Take 1,000 mg by mouth daily.   metoprolol (TOPROL-XL) 200 MG 24 hr  tablet Take 200 mg by mouth daily.   Multiple Vitamins-Minerals (COMPLETE ENERGY) TABS Take 1 tablet by mouth daily.   nitroGLYCERIN (NITROSTAT) 0.4 MG SL tablet Place 1 tablet (0.4 mg total) under the tongue every 5 (five) minutes as needed for chest pain.   Omega-3 Fatty Acids (FISH OIL) 1000 MG CAPS Take 1 capsule by mouth daily.   omeprazole (PRILOSEC) 20 MG capsule Take 1 capsule by mouth daily.   ONETOUCH VERIO test strip TEST BLOOD SUGAR TWICE DAILY PRIOR TO MEALS   rosuvastatin (CRESTOR) 10 MG tablet Take 10 mg by mouth 2 (two) times a week.   traMADol (ULTRAM) 50 MG tablet Take 50 mg by mouth 2 (two) times daily.   triamcinolone (KENALOG) 0.025 % ointment Apply 1 application topically 2 (two) times daily.   valACYclovir (VALTREX) 1000 MG tablet Take 1,000 mg by mouth daily as needed.   No facility-administered encounter medications on file as of 11/13/2020.    Lab Results  Component Value Date/Time   HGBA1C 6.7 (H) 09/20/2020 10:21 AM     BP Readings from Last 3 Encounters:  09/18/20 104/60  06/26/20 118/72  12/20/19 (!) 160/80      Have you seen any other providers since your last visit with PCP?  Patient stated there have been no other visits since last visit with PCP.    Any changes in your medications or health?  Patient stated she has not had any changes         Any side effects from any medications?  Patient stated that she no longer wants to take Meloxicam, because it seems to                make joint pain worse.   Do you have an symptoms or problems not managed by your medications?  Joint pain has increased, and she is not sure why. She did report that she was on Crestor 15 years ago, and that she can tell it works much better for her than the generic. She does state that she is compliant with all of her medications, but she does not like taking "statins". She stated that she does not want to be on anymore medication.  Any concerns about your health  right now?  Increased joint pain    Has your provider asked that you check blood pressure, blood sugar, or follow special diet at home? She stated that she monitors her blood sugar and her last A1C was 6.8, which is down from 7.2, and she was very pleased with that.    Do you get any type of exercise on a regular basis? Patient stated that she was going to [employer] and Karate until [employer] pandemic. She just started going to another exercise program on Tuesdays and Thursdays. She mentioned that she may start Karate again soon.  She reported that she loves to wrestle around with her grandkids and "do karate moves on them". She also stated that she has been in college and earned her Bachelor's Degree last year, and has been very busy.    Can you think of a goal you would like to reach for your health?  Patient stated that she is trying to start routine of exercising again, now that [place] has slowed down. She reported that she felt better overall when she exercised regularly and hopes this will help her joints.   Do you have any problems getting your medications? Patient does not have problems getting her medication.   Patient's preferred pharmacy is:  [facility] [place], [place] - [address] AT [address] [place] [address] Phone: [phone] Fax: [phone]  Is there anything that you would like to discuss during the appointment?  Patient mentioned that she would like to talk about increase joint pain.     Mindy Ingram was reminded to have all medications, supplements and any blood glucose and blood pressure readings available for review with Mindy Ingram, for her telephone visit on 05/252/2022 @ 8:30 am.    Star Rating Drugs:  Medication:  Last Fill: [facility] Supply Losartan-HCTZ 08/26/2020 90DS   Metformin  08/26/2020 90DS   Rosuvastin   07/27/2020 [place], Mindy Ingram Clinical  Pharmacist Assistant

## 2020-11-13 NOTE — Progress Notes (Signed)
Chronic Care Management Pharmacy Note  11/22/2020 Name:  Mindy Ingram MRN:  [id number] DOB:  [birth year]   Plan Updates:   Patient states that she tolerated Crestor brand name much better than generic. Please consider sending in Crestor 10 mg three times weekly as brand name medically necessary. Pharmacist has funding to help with higher copay with brand name Crestor. Patient reports muscle pain and weakness with generic option.  Please send prescription if you agree. Pharmacist will work on approval if needed.    Subjective: Mindy Ingram is an 75 y.o. year old female who is a primary patient of Cox, Kirsten, MD.  The CCM team was consulted for assistance with disease management and care coordination needs.    Engaged with patient by telephone for initial visit in response to provider referral for pharmacy case management and/or care coordination services.   Consent to Services:  The patient was given the following information about Chronic Care Management services today, agreed to services, and gave verbal consent: 1. CCM service includes personalized support from designated clinical staff supervised by the primary care provider, including individualized plan of care and coordination with other care providers 2. 24/7 contact phone numbers for assistance for urgent and routine care needs. 3. Service will only be billed when office clinical staff spend 20 minutes or more in a month to coordinate care. 4. Only one practitioner may furnish and bill the service in a calendar month. 5.The patient may stop CCM services at any time (effective at the end of the month) by phone call to the office staff. 6. The patient will be responsible for cost sharing (co-pay) of up to 20% of the service fee (after annual deductible is met). Patient agreed to services and consent obtained.  Patient Care Team: [name], MD as PCP - General (Family Medicine) Burnice [facility], [facility] as Pharmacist  (Pharmacist)  Recent office visits:  09/18/2020: [name], MD (PCP) for HTN/ Discontinued Amlodipine, Carisoprodol, and Meloxicam. Completed course of Methocarbamol. Change in therapy: Discontinued Valsartan-Hydrochlorothiazide 160-12.5 mg.    Recent consult visits:  07/03/2020: [name], MD (Rheumatology) for joint pain / labs drawn, X-ray (R) elbow/ no medication changes noted   05/31/2020: [name], MD (Cardiology) for HTN/ EKG obtained / no medication changes noted    Hospital visits:  No hospital visits noted within the past 6 months   Objective:  Lab Results  Component Value Date   CREATININE 0.90 09/20/2020   BUN 13 09/20/2020   GFRNONAA >60 09/14/2008   GFRAA  09/14/2008    >60        The eGFR has been calculated using the MDRD equation. This calculation has not been validated in all clinical situations. eGFR's persistently <60 mL/min signify possible Chronic Kidney Disease.   NA 145 (H) 09/20/2020   K 4.2 09/20/2020   CALCIUM 10.0 09/20/2020   CO2 24 09/20/2020   GLUCOSE 109 (H) 09/20/2020    Lab Results  Component Value Date/Time   HGBA1C 6.7 (H) 09/20/2020 10:21 AM    Last diabetic Eye exam: No results found for: HMDIABEYEEXA  Last diabetic Foot exam: No results found for: HMDIABFOOTEX   Lab Results  Component Value Date   CHOL 171 09/20/2020   HDL 58 09/20/2020   LDLCALC 95 09/20/2020   TRIG 102 09/20/2020   CHOLHDL 2.9 09/20/2020    Hepatic Function Latest Ref Rng & Units 09/20/2020  Total Protein 6.0 - 8.5 g/dL 6.9  Albumin 3.7 - 4.7  g/dL 4.5  AST 0 - 40 IU/L 20  ALT 0 - 32 IU/L 10  Alk Phosphatase 44 - 121 IU/L 45  Total Bilirubin 0.0 - 1.2 mg/dL 0.3    Lab Results  Component Value Date/Time   TSH 1.450 09/20/2020 10:21 AM    CBC Latest Ref Rng & Units 09/20/2020 09/19/2008  WBC 3.4 - 10.8 x10E3/uL 4.6 -  Hemoglobin 11.1 - 15.9 g/dL 13.4 11.1(L)  Hematocrit 34.0 - 46.6 % 40.0 -  Platelets 150 - 450 x10E3/uL 283 -     No results found for: VD25OH  Clinical ASCVD: No  The 10-year ASCVD risk score Mikey Bussing DC Jr., et al., 2013) is: 22.6%   Values used to calculate the score:     Age: 75 years     Sex: Female     Is Non-Hispanic African American: Yes     Diabetic: Yes     Tobacco smoker: No     Systolic Blood Pressure: 188 mmHg     Is BP treated: Yes     HDL Cholesterol: 58 mg/dL     Total Cholesterol: 171 mg/dL    Depression screen [facility] 2/9 11/21/2020 09/18/2020  Decreased Interest 0 0  Down, Depressed, Hopeless 0 0  PHQ - 2 Score 0 0     Other: (CHADS2VASc if Afib, MMRC or CAT for COPD, ACT, DEXA)  Social History   Tobacco Use  Smoking Status Never Smoker  Smokeless Tobacco Never Used   BP Readings from Last 3 Encounters:  09/18/20 104/60  06/26/20 118/72  12/20/19 (!) 160/80   Pulse Readings from Last 3 Encounters:  09/18/20 78  06/26/20 72  05/10/19 77   Wt Readings from Last 3 Encounters:  09/18/20 147 lb 9.6 oz (67 kg)  06/26/20 146 lb 12.8 oz (66.6 kg)  12/20/19 149 lb (67.6 kg)   BMI Readings from Last 3 Encounters:  09/18/20 30.85 kg/m  06/26/20 29.65 kg/m  12/20/19 20.49 kg/m    Assessment/Interventions: Review of patient past medical history, allergies, medications, health status, including review of consultants reports, laboratory and other test data, was performed as part of comprehensive evaluation and provision of chronic care management services.   SDOH:  (Social Determinants of Health) assessments and interventions performed: Yes  SDOH Screenings   Alcohol Screen: Not on file  Depression (PHQ2-9): Low Risk   . PHQ-2 Score: 0  Financial Resource Strain: Not on file  Food Insecurity: No Food Insecurity  . Worried About Charity fundraiser in the Last Year: Never true  . Ran Out of Food in the Last Year: Never true  Housing: Low Risk   . Last Housing Risk Score: 0  Physical Activity: Not on file  Social Connections: Not on file  Stress: Not on file   Tobacco Use: Low Risk   . Smoking Tobacco Use: Never Smoker  . Smokeless Tobacco Use: Never Used  Transportation Needs: No Transportation Needs  . Lack of Transportation (Medical): No  . Lack of Transportation (Non-Medical): No    CCM Care Plan  Allergies  Allergen Reactions  . Dairy Aid [Lactase]     Hives, diarrhea  . Lactose Anaphylaxis and Other (See Comments)  . Atorvastatin Other (See Comments)    Severe myalgias  . Compazine [Prochlorperazine Edisylate]   . Hydrocodone-Acetaminophen     REACTION: sore throat with vicodin  . Hydrocodone-Acetaminophen Other (See Comments)    Other reaction(s): Other (See Comments) Sore throat , like it is closing up  on her REACTION: sore throat with vicodin   . Milk-Related Compounds Other (See Comments)  . Myrbetriq [Mirabegron]     Headache   . Other Other (See Comments)    Other reaction(s): headache Other reaction(s): confused  . Oxycontin [Oxycodone Hcl]     "makes me crazy"  . Prochlorperazine Other (See Comments)  . Prochlorperazine Edisylate     REACTION: stroke-like symptoms with companzine  . Rosuvastatin     Can take brand crestor-severe myalgias  . Shellfish Allergy   . Shellfish-Derived Products Other (See Comments)  . Umeclidinium-Vilanterol Other (See Comments)    Lactose allergy  . Percodan [Oxycodone-Aspirin] Palpitations    Fast heartbeat with percodan    Medications Reviewed Today    Reviewed by Burnice Logan, [facility] (Pharmacist) on 11/21/20 at 0950  Med List Status: <None>  Medication Order Taking? Sig Documenting Provider Last Dose Status Informant  albuterol (PROVENTIL HFA;VENTOLIN HFA) 108 (90 Base) MCG/ACT inhaler [id number] Yes Inhale 2 puffs every 4-6 hours as needed Baird Lyons D, MD Taking Active   amLODipine (NORVASC) 5 MG tablet [id number] Yes TAKE 1/2 TABLET BY MOUTH DAILY [name], PA-C Taking Active   aspirin EC 81 MG tablet [id number] Yes Take 81 mg by mouth daily. [provider]  Taking Active   azelastine (ASTELIN) 0.1 % nasal spray [id number] Yes Place 1 spray into the nose 2 (two) times daily as needed. [provider] Taking Active   cetirizine (ZYRTEC) 10 MG tablet [id number] Yes Take 1 tablet by mouth daily as needed. [provider] Taking Active   CHLOROPHYLL EX [id number] Yes Apply 1 tablet topically daily. [provider] Taking Active   Cholecalciferol (VITAMIN D PO) [id number] Yes Take 2,000 Units by mouth daily.  [provider] Taking Active   Elderberry 575 MG/5ML SYRP [id number] Yes Take 5 mLs by mouth daily. [provider] Taking Active   fluticasone (FLONASE) 50 MCG/ACT nasal spray [id number] Yes 1-2 puffs each nostril once daily Baird Lyons D, MD Taking Active   Inulin (FIBER CHOICE PO) [id number] Yes Take 1 tablet by mouth daily as needed. [provider] Taking Active   Lancets (ONETOUCH DELICA PLUS BSWHQP59F) [place] [id number] Yes CHECK BLOOD SUGAR TWICE DAILY PRIOR TO MEAL [provider] Taking Active   losartan-hydrochlorothiazide (HYZAAR) 50-12.5 MG tablet [id number] Yes TAKE 1 TABLET BY MOUTH DAILY Revankar, Reita Cliche, MD Taking Active   metFORMIN (GLUCOPHAGE-XR) 500 MG 24 hr tablet [id number] Yes Take 500 mg by mouth in the morning and at bedtime. [provider] Taking Active            Med Note ([place] Dec 20, 2019 11:15 AM)    metoprolol (TOPROL-XL) 200 MG 24 hr tablet [id number] Yes Take 200 mg by mouth daily. [provider] Taking Active Self  Multiple Vitamins-Minerals (COMPLETE ENERGY) TABS [id number] Yes Take 1 tablet by mouth daily. [provider] Taking Active Self  nitroGLYCERIN (NITROSTAT) 0.4 MG SL tablet [id number] Yes Place 1 tablet (0.4 mg total) under the tongue every 5 (five) minutes as needed for chest pain. Revankar, Reita Cliche, MD Taking Active   Nutritional Supplements (JUICE PLUS FIBRE PO) [id number] Yes Take 1 tablet by mouth daily.  [provider] Taking Active   Omega-3 Fatty Acids (FISH OIL) 1000 MG CAPS [id number] Yes Take 1 capsule by mouth daily. [provider] Taking Active   omeprazole (PRILOSEC) 20 MG capsule [id number] Yes Take 1 capsule by mouth daily.  [provider] Taking Active   [facility] VERIO test strip [id number] Yes 1 each by Other route daily. [provider] Taking Active   rosuvastatin (CRESTOR) 10 MG tablet [id number] Yes Take 10 mg by mouth 2 (two) times a week. [provider] Taking Active Self  traMADol (ULTRAM) 50 MG tablet [id number] Yes Take 50 mg by mouth 2 (two) times daily as needed. [provider] Taking Active   triamcinolone (KENALOG) 0.025 % ointment [id number] Yes Apply 1 application topically 2 (two) times daily as needed. [provider] Taking Active   valACYclovir (VALTREX) 1000 MG tablet [id number] Yes Take 1,000 mg by mouth daily as needed. [provider] Taking Active   Med List Note Annamaria Boots, Kasandra Knudsen, MD 03/09/11 1408): CPAP 12, reduced to 10 as of 03/06/2011          Patient Active Problem List   Diagnosis Date Noted  . Allergic rhinitis 06/25/2020  . Alopecia 06/25/2020  . Shoulder joint pain 06/25/2020  . Costochondritis 06/25/2020  . 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  . Chest discomfort 06/25/2020  . Corn of toe 06/25/2020  . Cramp in lower leg associated with rest 06/25/2020  . Eructation 06/25/2020  . Fever blister 06/25/2020  . Hyperlipidemia 06/25/2020  . Insect bite 06/25/2020  . Lateral epicondylitis 06/25/2020  . Menopause 06/25/2020  . Multiple benign melanocytic nevi 06/25/2020  . Other long term (current) drug therapy 06/25/2020  . Type 2 diabetes mellitus without complications ([place]) 76/16/0737  . Vitamin D deficiency 06/25/2020  . Sleep apnea   . Rapid heartbeat   . Otitis media   . OSA (obstructive sleep apnea)   . Hypertension   . Gastro-esophageal reflux disease  without esophagitis   . Chronic back pain   . Arthropathy, unspecified   . Bronchitis   . Diabetes mellitus due to underlying condition with unspecified complications ([place]) 10/62/6948  . Bilateral impacted cerumen 07/01/2018  . Ear pain, bilateral 07/01/2018  . 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  . Encounter for immunization 03/15/2018  . Acquired hammer toe of right foot 08/11/2017  . Sinusitis 04/22/2017  . Skin lesion 09/18/2016  . Upper airway cough syndrome 09/24/2015  . Rash and nonspecific skin eruption 04/03/2015  . Dyslipidemia 01/23/2015  . Essential hypertension 01/23/2015  . 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 Degeneration of lumbosacral intervertebral disc 01/26/2012  . Low back pain 01/26/2012  . AODM 10/17/2007  . Obstructive sleep apnea syndrome 10/17/2007  . Seasonal and perennial allergic rhinitis 10/17/2007  . Allergic asthma, mild intermittent, uncomplicated 54/62/7035  . GERD 10/17/2007  . Asthmatic bronchitis, mild intermittent, with acute exacerbation 10/11/2007    Immunization History  Administered Date(s) Administered  . Hepatitis B, adult 07/01/2003  . Influenza Split 03/12/2011, 03/30/2012, 04/01/2013, 04/04/2014, 04/03/2015, 04/02/2016, 03/30/2017, 04/14/2020  . Influenza Whole 04/30/2009, 03/06/2011  . Influenza, High Dose Seasonal PF 03/30/2017, 03/11/2018, 03/11/2018, 03/16/2019  . Influenza,inj,Quad PF,6+ Mos 03/30/2013, 03/30/2014, 04/03/2015, 04/02/2016  . Moderna Sars-Covid-2 Vaccination 08/05/2019, 08/31/2019, 04/25/2020  . Pneumococcal Conjugate-13 02/16/2014  . Pneumococcal Polysaccharide-23 06/30/1996, 10/28/2016  . Td 07/01/2003  . Tdap 01/16/2012  . Zoster 07/27/2013, 05/09/2020, 08/12/2020    Conditions to be addressed/monitored:  Hypertension, Hyperlipidemia, Diabetes, GERD and vitamin D deficiency, obstructive sleep apnea.   Care Plan : [place]  Updates made by Burnice Logan, [facility] since 11/22/2020 12:00 AM    Problem:  dm,htn, hld   Priority: High  Onset Date: 11/22/2020    [facility]  Goal: Disease State Management   Start Date: 11/22/2020  Expected End Date: 11/22/2021  This Visit's Progress: On track  Priority: High  Note:   Current Barriers:  . Unable to independently afford treatment regimen  Pharmacist Clinical Goal(s):  Marland Kitchen Patient will verbalize ability to afford treatment regimen through collaboration with PharmD and provider.   Interventions: . 1:1 collaboration with [name], MD regarding development and update of comprehensive plan of care as evidenced by provider attestation and co-signature . Inter-disciplinary care team collaboration (see longitudinal plan of care) . Comprehensive medication review performed; medication list updated in electronic medical record  Hypertension (BP goal <130/80) -Controlled -Current treatment: . amlodipine 5 mg daily  . Losartan-hydrochlorothiazide 50-12.5 mg daily  . Metoprolol succinate 200 mg daily  -Medications previously tried: none reported  -Current home readings:  160/88 mmHg is highest ever with meloxicam. She knows goal is <130/80 mmHg. Most readings are well controlled.  -Current dietary habits: eats breakfast and supper mainly.  -Current exercise habits: Was previously in karate and exercise class. She hopes to rejoin the class soon. Had stopped prior to [place].  -Denies hypotensive/hypertensive symptoms -Educated on BP goals and benefits of medications for prevention of heart attack, stroke and kidney damage; Daily salt intake goal < 2300 mg; Exercise goal of 150 minutes per week; Importance of home blood pressure monitoring; -Counseled to monitor BP at home daily, document, and provide log at future appointments -Counseled on diet and exercise extensively Recommended to continue current medication Recommended patient return to exercise class.   Hyperlipidemia: (LDL goal < 70) -Not ideally controlled -Current treatment: . aspirin ec  81 mg daily  . Rosuvastatin 10 mg twice weekly  . Nitroglycerin 0.4 mg every 5 minutes prn chest pain  -Medications previously tried: brand name Crestor, atorvastatin -Current dietary patterns: reports breakfast meat and hot dogs  -Current exercise habits: was previously involved in karate and exercise class prior to [place] on Cholesterol goals;  Benefits of statin for ASCVD risk reduction; Importance of limiting foods high in cholesterol; Exercise goal of 150 minutes per week; -Recommended considering Crestor 10 mg three times weekly. Patient reports she can tolerate brand name Crestor but the generic leaves her achy. Pharmacist to request Dispense as Written for brand name Crestor.  Assessed patient finances. Patient approved for [employer] to cover cost of Crestor.   Diabetes (A1c goal <7%) -Controlled -Current medications: Marland Kitchen Metformin xr 500 mg twice daily  . One touch verio test strips twice daily  . Lancets daily prior to meal  -Medications previously tried: none reported  -Current home glucose readings . fasting glucose: 120, 121, 136, 156, 124, 128, 116, 128, 164 (birthday cake), 143, 168 (birthday cake) . post prandial glucose: 200 (ate 2 hot dogs) -Denies hypoglycemic/hyperglycemic symptoms -Current meal patterns:  . Late breakfast or early lunch: egg, bacon or sausage with grits or pancake occasionally (a few times a month). [place] Clap Croissant once a week as a treat.   . dinner: dines out mainly.  . snacks:  more birthday cake lately since celebrating all month long),  . drinks: unsweet tea, coffee  -Current exercise: limited since not attending exercise class after COVID -Educated on A1c and blood sugar goals; Complications of diabetes including kidney damage, retinal damage, and cardiovascular disease; Exercise goal of 150 minutes per week; Benefits of routine self-monitoring of blood sugar; Carbohydrate counting and/or plate method -Counseled to check  feet daily and get yearly eye exams -Counseled on diet and  exercise extensively Recommended to continue current medication Counseled on starting brand name Crestor three times weekly.   Allergic Asthma (Goal: control symptoms and prevent exacerbations) -Controlled -Current treatment  . albuterol inhaler 2 puffs every 4-6 hours prn  . Azelastine 0.1% 1 spray into the nose bid prn  . Cetirizine 10 mg daily prn allergies . flonase 50 mcg/act 1-2 sprays each nostril daily  -Medications previously tried:  allergy shots  -Frequency of rescue inhaler use: rarely -Counseled on When to use rescue inhaler -Recommended to continue current medication  Bone Helath (Goal prevent fractures) -Not ideally controlled -Last DEXA Scan: not available in chart  -Current treatment  . vitamin d 2000 units daily  -Medications previously tried: none reported  -Recommend [phone] units of vitamin D daily. Recommend 1200 mg of calcium daily from dietary and supplemental sources. Recommend weight-bearing and muscle strengthening exercises for building and maintaining bone density. -Counseled on diet and exercise extensively  GERD (Goal: manage symptoms) -Controlled -Current treatment  . fiber choice daily prn  . Omeprazole 20 mg daily  -Medications previously tried: none reported  -Counseled on diet and exercise extensively Recommended to continue current medication   Disc Degeneration (Goal: manage pain) -Controlled -Current treatment  . tramadol 50 mg bid prn  -Medications previously tried: meloxicam  -Recommended to continue current medication  Health Maintenance -Vaccine gaps: recommend fourth COVID shot -Current therapy:  . Valtrex 1000 mg daily prn  . Elderberry 575 mg/5 ml daily  . Multivitamin daily  . Triamcinolone 0.025% bid prn -Educated on Cost vs benefit of each product must be carefully weighed by individual consumer -Patient is satisfied with current therapy and denies  issues -Recommended to continue current medication   Patient Goals/Self-Care Activities . Patient will:  - take medications as prescribed focus on medication adherence by taking medication as needed check glucose daily, document, and provide at future appointments check blood pressure daily, document, and provide at future appointments target a minimum of 150 minutes of moderate intensity exercise weekly engage in dietary modifications by limiting carbohydrates and fried/fatty foods. Encouraged lean protein, fruits and vegetables.   Follow Up Plan: Telephone follow up appointment with care management team member scheduled for: 06/2021      Medication Assistance: Application for Crestor grant funding  medication assistance program. in process.  Anticipated assistance start date 11/28/2020.  See plan of care for additional detail.  Patient's preferred pharmacy is:  [id number], [place] - [address] AT [address] [place] [address] Phone: [phone] Fax: [phone]  Uses pill box? No - stores together in cabinet and denies missed doses Pt endorses good compliance  We discussed: Benefits of medication synchronization, packaging and delivery as well as enhanced pharmacist oversight with [employer]. Patient decided to: Continue current medication management strategy  Care Plan and Follow Up Patient Decision:  Patient agrees to Care Plan and Follow-up.  Plan: Telephone follow up appointment with care management team member scheduled for:  06/2021

## 2020-11-21 ENCOUNTER — Other Ambulatory Visit: Payer: Self-pay

## 2020-11-21 ENCOUNTER — Ambulatory Visit (INDEPENDENT_AMBULATORY_CARE_PROVIDER_SITE_OTHER): Payer: BC Managed Care – PPO

## 2020-11-21 DIAGNOSIS — I10 Essential (primary) hypertension: Secondary | ICD-10-CM | POA: Diagnosis not present

## 2020-11-21 DIAGNOSIS — E1169 Type 2 diabetes mellitus with other specified complication: Secondary | ICD-10-CM

## 2020-11-21 DIAGNOSIS — J4520 Mild intermittent asthma, uncomplicated: Secondary | ICD-10-CM

## 2020-11-21 DIAGNOSIS — K219 Gastro-esophageal reflux disease without esophagitis: Secondary | ICD-10-CM

## 2020-11-21 DIAGNOSIS — E782 Mixed hyperlipidemia: Secondary | ICD-10-CM | POA: Diagnosis not present

## 2020-11-21 NOTE — Patient Instructions (Addendum)
Visit Information  Thank you for your time discussing your medications. I look forward to working with you to achieve your health care goals. Below is a summary of what we talked about during our visit.   Goals Addressed            This Visit's Progress   . Manage My Medicine       Timeframe:  Long-Range Goal Priority:  High Start Date:                             Expected End Date:                       Follow Up Date 06/2021   - call for medicine refill 2 or 3 days before it runs out - keep a list of all the medicines I take; vitamins and herbals too - use a pillbox to sort medicine    Why is this important?   . These steps will help you keep on track with your medicines.   Notes:     Marland Kitchen Monitor and Manage My Blood Sugar-Diabetes Type 2       Timeframe:  Long-Range Goal Priority:  High Start Date:                             Expected End Date:                       Follow Up Date 06/2021   - check blood sugar at prescribed times - check blood sugar if I feel it is too high or too low - take the blood sugar log to all doctor visits    Why is this important?    Checking your blood sugar at home helps to keep it from getting very high or very low.   Writing the results in a diary or log helps the doctor know how to care for you.   Your blood sugar log should have the time, date and the results.   Also, write down the amount of insulin or other medicine that you take.   Other information, like what you ate, exercise done and how you were feeling, will also be helpful.     Notes:     . Track and Manage My Blood Pressure-Hypertension       Timeframe:  Long-Range Goal Priority:  High Start Date:                             Expected End Date:                       Follow Up Date 06/2021   - check blood pressure daily - write blood pressure results in a log or diary    Why is this important?    You won't feel high blood pressure, but it can still hurt your  blood vessels.   High blood pressure can cause heart or kidney problems. It can also cause a stroke.   Making lifestyle changes like losing a little weight or eating less salt will help.   Checking your blood pressure at home and at different times of the day can help to control blood pressure.   If the doctor prescribes medicine remember to take it the  way the doctor ordered.   Call the office if you cannot afford the medicine or if there are questions about it.     Notes:        Patient Care Plan: CCM Pharmacy Care Plan    Problem Identified: dm,htn, hld   Priority: High  Onset Date: 11/22/2020    Long-Range Goal: Disease State Management   Start Date: 11/22/2020  Expected End Date: 11/22/2021  This Visit's Progress: On track  Priority: High  Note:   Current Barriers:  . Unable to independently afford treatment regimen  Pharmacist Clinical Goal(s):  Marland Kitchen Patient will verbalize ability to afford treatment regimen through collaboration with PharmD and provider.   Interventions: . 1:1 collaboration with [name], MD regarding development and update of comprehensive plan of care as evidenced by provider attestation and co-signature . Inter-disciplinary care team collaboration (see longitudinal plan of care) . Comprehensive medication review performed; medication list updated in electronic medical record  Hypertension (BP goal <130/80) -Controlled -Current treatment: . amlodipine 5 mg daily  . Losartan-hydrochlorothiazide 50-12.5 mg daily  . Metoprolol succinate 200 mg daily  -Medications previously tried: none reported  -Current home readings:  160/88 mmHg is highest ever with meloxicam. She knows goal is <130/80 mmHg. Most readings are well controlled.  -Current dietary habits: eats breakfast and supper mainly.  -Current exercise habits: Was previously in karate and exercise class. She hopes to rejoin the class soon. Had stopped prior to [place].  -Denies  hypotensive/hypertensive symptoms -Educated on BP goals and benefits of medications for prevention of heart attack, stroke and kidney damage; Daily salt intake goal < 2300 mg; Exercise goal of 150 minutes per week; Importance of home blood pressure monitoring; -Counseled to monitor BP at home daily, document, and provide log at future appointments -Counseled on diet and exercise extensively Recommended to continue current medication Recommended patient return to exercise class.   Hyperlipidemia: (LDL goal < 70) -Not ideally controlled -Current treatment: . aspirin ec 81 mg daily  . Rosuvastatin 10 mg twice weekly  . Nitroglycerin 0.4 mg every 5 minutes prn chest pain  -Medications previously tried: brand name Crestor, atorvastatin -Current dietary patterns: reports breakfast meat and hot dogs  -Current exercise habits: was previously involved in karate and exercise class prior to [address] on Cholesterol goals;  Benefits of statin for ASCVD risk reduction; Importance of limiting foods high in cholesterol; Exercise goal of 150 minutes per week; -Recommended considering Crestor 10 mg three times weekly. Patient reports she can tolerate brand name Crestor but the generic leaves her achy. Pharmacist to request Dispense as Written for brand name Crestor.  Assessed patient finances. Patient approved for [employer] to cover cost of Crestor.   Diabetes (A1c goal <7%) -Controlled -Current medications: Marland Kitchen Metformin xr 500 mg twice daily  . One touch verio test strips twice daily  . Lancets daily prior to meal  -Medications previously tried: none reported  -Current home glucose readings . fasting glucose: 120, 121, 136, 156, 124, 128, 116, 128, 164 (birthday cake), 143, 168 (birthday cake) . post prandial glucose: 200 (ate 2 hot dogs) -Denies hypoglycemic/hyperglycemic symptoms -Current meal patterns:  . Late breakfast or early lunch: egg, bacon or sausage with grits or pancake  occasionally (a few times a month). D[place] Clap Croissant once a week as a treat.   . dinner: dines out mainly.  . snacks:  more birthday cake lately since celebrating all month long),  . drinks: unsweet tea, coffee  -Current  exercise: limited since not attending exercise class after COVID -Educated on A1c and blood sugar goals; Complications of diabetes including kidney damage, retinal damage, and cardiovascular disease; Exercise goal of 150 minutes per week; Benefits of routine self-monitoring of blood sugar; Carbohydrate counting and/or plate method -Counseled to check feet daily and get yearly eye exams -Counseled on diet and exercise extensively Recommended to continue current medication Counseled on starting brand name Crestor three times weekly.   Allergic Asthma (Goal: control symptoms and prevent exacerbations) -Controlled -Current treatment  . albuterol inhaler 2 puffs every 4-6 hours prn  . Azelastine 0.1% 1 spray into the nose bid prn  . Cetirizine 10 mg daily prn allergies . flonase 50 mcg/act 1-2 sprays each nostril daily  -Medications previously tried:  allergy shots  -Frequency of rescue inhaler use: rarely -Counseled on When to use rescue inhaler -Recommended to continue current medication  Bone Helath (Goal prevent fractures) -Not ideally controlled -Last DEXA Scan: not available in chart  -Current treatment  . vitamin d 2000 units daily  -Medications previously tried: none reported  -Recommend [phone] units of vitamin D daily. Recommend 1200 mg of calcium daily from dietary and supplemental sources. Recommend weight-bearing and muscle strengthening exercises for building and maintaining bone density. -Counseled on diet and exercise extensively  GERD (Goal: manage symptoms) -Controlled -Current treatment  . fiber choice daily prn  . Omeprazole 20 mg daily  -Medications previously tried: none reported  -Counseled on diet and exercise extensively Recommended  to continue current medication   Disc Degeneration (Goal: manage pain) -Controlled -Current treatment  . tramadol 50 mg bid prn  -Medications previously tried: meloxicam  -Recommended to continue current medication  Health Maintenance -Vaccine gaps: recommend fourth COVID shot -Current therapy:  . Valtrex 1000 mg daily prn  . Elderberry 575 mg/5 ml daily  . Multivitamin daily  . Triamcinolone 0.025% bid prn -Educated on Cost vs benefit of each product must be carefully weighed by individual consumer -Patient is satisfied with current therapy and denies issues -Recommended to continue current medication   Patient Goals/Self-Care Activities . Patient will:  - take medications as prescribed focus on medication adherence by taking medication as needed check glucose daily, document, and provide at future appointments check blood pressure daily, document, and provide at future appointments target a minimum of 150 minutes of moderate intensity exercise weekly engage in dietary modifications by limiting carbohydrates and fried/fatty foods. Encouraged lean protein, fruits and vegetables.   Follow Up Plan: Telephone follow up appointment with care management team member scheduled for: 06/2021      Ms. Gutmann was given information about Chronic Care Management services today including:  1. CCM service includes personalized support from designated clinical staff supervised by her physician, including individualized plan of care and coordination with other care providers 2. 24/7 contact phone numbers for assistance for urgent and routine care needs. 3. Standard insurance, coinsurance, copays and deductibles apply for chronic care management only during months in which we provide at least 20 minutes of these services. Most insurances cover these services at 100%, however patients may be responsible for any copay, coinsurance and/or deductible if applicable. This service may help you avoid  the need for more expensive face-to-face services. 4. Only one practitioner may furnish and bill the service in a calendar month. 5. The patient may stop CCM services at any time (effective at the end of the month) by phone call to the office staff.  Patient agreed to services and verbal  consent obtained.   The patient verbalized understanding of instructions, educational materials, and care plan provided today and declined offer to receive copy of patient instructions, educational materials, and care plan.  Telephone follow up appointment with pharmacy team member scheduled for: 06/2021  Sherre Poot, PharmD Clinical Pharmacist [facility] [phone] (office) [phone] (mobile)    [email or web address]">  DASH Eating Plan DASH stands for Dietary Approaches to Stop Hypertension. The DASH eating plan is a healthy eating plan that has been shown to:  Reduce high blood pressure (hypertension).  Reduce your risk for type 2 diabetes, heart disease, and stroke.  Help with weight loss. What are tips for following this plan? Reading food labels  Check food labels for the amount of salt (sodium) per serving. Choose foods with less than 5 percent of the Daily Value of sodium. Generally, foods with less than 300 milligrams (mg) of sodium per serving fit into this eating plan.  To find whole grains, look for the word "whole" as the first word in the ingredient list. Shopping  Buy products labeled as "low-sodium" or "no salt added."  Buy fresh foods. Avoid canned foods and pre-made or frozen meals. Cooking  Avoid adding salt when cooking. Use salt-free seasonings or herbs instead of table salt or sea salt. Check with your health care provider or pharmacist before using salt substitutes.  Do not fry foods. Cook foods using healthy methods such as baking, boiling, grilling, roasting, and broiling instead.  Cook with heart-healthy  oils, such as olive, canola, avocado, soybean, or sunflower oil. Meal planning  Eat a balanced diet that includes: ? 4 or more servings of fruits and 4 or more servings of vegetables each day. Try to fill one-half of your plate with fruits and vegetables. ? 6-8 servings of whole grains each day. ? Less than 6 oz (170 g) of lean meat, poultry, or fish each day. A 3-oz (85-g) serving of meat is about the same size as a deck of cards. One egg equals 1 oz (28 g). ? 2-3 servings of low-fat dairy each day. One serving is 1 cup (237 mL). ? 1 serving of nuts, seeds, or beans 5 times each week. ? 2-3 servings of heart-healthy fats. Healthy fats called omega-3 fatty acids are found in foods such as walnuts, flaxseeds, fortified milks, and eggs. These fats are also found in cold-water fish, such as sardines, salmon, and mackerel.  Limit how much you eat of: ? Canned or prepackaged foods. ? Food that is high in trans fat, such as some fried foods. ? Food that is high in saturated fat, such as fatty meat. ? Desserts and other sweets, sugary drinks, and other foods with added sugar. ? Full-fat dairy products.  Do not salt foods before eating.  Do not eat more than 4 egg yolks a week.  Try to eat at least 2 vegetarian meals a week.  Eat more home-cooked food and less restaurant, buffet, and fast food.   Lifestyle  When eating at a restaurant, ask that your food be prepared with less salt or no salt, if possible.  If you drink alcohol: ? Limit how much you use to:  0-1 drink a day for women who are not pregnant.  0-2 drinks a day for men. ? Be aware of how much alcohol is in your drink. In the [place] one drink equals one 12 oz bottle of beer (355 mL), one 5 oz glass of wine (148 mL), or one 1 oz  glass of hard liquor (44 mL). General information  Avoid eating more than 2,300 mg of salt a day. If you have hypertension, you may need to reduce your sodium intake to 1,500 mg a day.  Work with your  health care provider to maintain a healthy body weight or to lose weight. Ask what an ideal weight is for you.  Get at least 30 minutes of exercise that causes your heart to beat faster (aerobic exercise) most days of the week. Activities may include walking, swimming, or biking.  Work with your health care provider or dietitian to adjust your eating plan to your individual calorie needs. What foods should I eat? Fruits All fresh, dried, or frozen fruit. Canned fruit in natural juice (without added sugar). Vegetables Fresh or frozen vegetables (raw, steamed, roasted, or grilled). Low-sodium or reduced-sodium tomato and vegetable juice. Low-sodium or reduced-sodium tomato sauce and tomato paste. Low-sodium or reduced-sodium canned vegetables. Grains Whole-grain or whole-wheat bread. Whole-grain or whole-wheat pasta. Nariah Morgano rice. [place] Morrow. Bulgur. Whole-grain and low-sodium cereals. Pita bread. Low-fat, low-sodium crackers. Whole-wheat flour tortillas. Meats and other proteins Skinless chicken or [place]. Ground chicken or [place]. Pork with fat trimmed off. Fish and seafood. Egg whites. Dried beans, peas, or lentils. Unsalted nuts, nut butters, and seeds. Unsalted canned beans. Lean cuts of beef with fat trimmed off. Low-sodium, lean precooked or cured meat, such as sausages or meat loaves. Dairy Low-fat (1%) or fat-free (skim) milk. Reduced-fat, low-fat, or fat-free cheeses. Nonfat, low-sodium ricotta or cottage cheese. Low-fat or nonfat yogurt. Low-fat, low-sodium cheese. Fats and oils Soft margarine without trans fats. Vegetable oil. Reduced-fat, low-fat, or light mayonnaise and salad dressings (reduced-sodium). Canola, safflower, olive, avocado, soybean, and sunflower oils. Avocado. Seasonings and condiments Herbs. Spices. Seasoning mixes without salt. Other foods Unsalted popcorn and pretzels. Fat-free sweets. The items listed above may not be a complete list of foods and beverages you  can eat. Contact a dietitian for more information. What foods should I avoid? Fruits Canned fruit in a light or heavy syrup. Fried fruit. Fruit in cream or butter sauce. Vegetables Creamed or fried vegetables. Vegetables in a cheese sauce. Regular canned vegetables (not low-sodium or reduced-sodium). Regular canned tomato sauce and paste (not low-sodium or reduced-sodium). Regular tomato and vegetable juice (not low-sodium or reduced-sodium). Angie Fava. Olives. Grains Baked goods made with fat, such as croissants, muffins, or some breads. Dry pasta or rice meal packs. Meats and other proteins Fatty cuts of meat. Ribs. Fried meat. Berniece Salines. Bologna, salami, and other precooked or cured meats, such as sausages or meat loaves. Fat from the back of a pig (fatback). Bratwurst. Salted nuts and seeds. Canned beans with added salt. Canned or smoked fish. Whole eggs or egg yolks. Chicken or [place] with skin. Dairy Whole or 2% milk, cream, and half-and-half. Whole or full-fat cream cheese. Whole-fat or sweetened yogurt. Full-fat cheese. Nondairy creamers. Whipped toppings. Processed cheese and cheese spreads. Fats and oils Butter. Stick margarine. Lard. Shortening. Ghee. Bacon fat. Tropical oils, such as coconut, palm kernel, or palm oil. Seasonings and condiments Onion salt, garlic salt, seasoned salt, table salt, and sea salt. Worcestershire sauce. Tartar sauce. Barbecue sauce. Teriyaki sauce. Soy sauce, including reduced-sodium. Steak sauce. Canned and packaged gravies. Fish sauce. Oyster sauce. Cocktail sauce. Store-bought horseradish. Ketchup. Mustard. Meat flavorings and tenderizers. Bouillon cubes. Hot sauces. Pre-made or packaged marinades. Pre-made or packaged taco seasonings. Relishes. Regular salad dressings. Other foods Salted popcorn and pretzels. The items listed above may not be a complete  list of foods and beverages you should avoid. Contact a dietitian for more information. Where to find more  information  National Heart, Lung, and Blood Institute: https://wilson-eaton.com/  American Heart Association: www.heart.org  Academy of Nutrition and Dietetics: www.eatright.[place]: www.kidney.org Summary  The DASH eating plan is a healthy eating plan that has been shown to reduce high blood pressure (hypertension). It may also reduce your risk for type 2 diabetes, heart disease, and stroke.  When on the DASH eating plan, aim to eat more fresh fruits and vegetables, whole grains, lean proteins, low-fat dairy, and heart-healthy fats.  With the DASH eating plan, you should limit salt (sodium) intake to 2,300 mg a day. If you have hypertension, you may need to reduce your sodium intake to 1,500 mg a day.  Work with your health care provider or dietitian to adjust your eating plan to your individual calorie needs. This information is not intended to replace advice given to you by your health care provider. Make sure you discuss any questions you have with your health care provider. Document Revised: 05/20/2019 Document Reviewed: 05/20/2019 Elsevier Patient Education  2021 [employer].

## 2020-11-27 ENCOUNTER — Other Ambulatory Visit: Payer: Self-pay | Admitting: Family Medicine

## 2020-11-27 MED ORDER — CRESTOR 10 MG PO TABS: ORAL_TABLET | ORAL | 0 refills | Status: DC

## 2020-12-03 ENCOUNTER — Telehealth: Payer: Self-pay

## 2020-12-03 DIAGNOSIS — E1169 Type 2 diabetes mellitus with other specified complication: Secondary | ICD-10-CM

## 2020-12-05 ENCOUNTER — Telehealth: Payer: Self-pay

## 2020-12-05 NOTE — Telephone Encounter (Signed)
PA submitted and approved for Crestor via covermymeds.

## 2020-12-06 NOTE — Telephone Encounter (Signed)
  Chronic Care Management   Note  12/06/2020 Name: Mindy Ingram MRN: [id number] DOB: 12-30-1945  Pharmacist coordinating prior authorization for brand name Crestor. Request denied since patient not documented to have tried and filed 3 cholesterol medications. Pharmacist will have assistant reach out to patient to determine if anything other than rosuvastatin or atorvastatin has been tried and failed. [facility] has [employer] grant information once approved.   Sherre Poot, PharmD, [facility] Clinical Pharmacist [facility] [phone] (office) [phone] (mobile)

## 2020-12-06 NOTE — Progress Notes (Signed)
Called patient and LVM regarding cholesterol medications.   Marcine Matar, [place] Clinical Pharmacist Assistant

## 2020-12-11 DIAGNOSIS — E119 Type 2 diabetes mellitus without complications: Secondary | ICD-10-CM | POA: Diagnosis not present

## 2020-12-11 DIAGNOSIS — M2041 Other hammer toe(s) (acquired), right foot: Secondary | ICD-10-CM | POA: Diagnosis not present

## 2020-12-14 DIAGNOSIS — G4733 Obstructive sleep apnea (adult) (pediatric): Secondary | ICD-10-CM | POA: Diagnosis not present

## 2020-12-17 ENCOUNTER — Telehealth: Payer: Self-pay

## 2020-12-17 NOTE — Telephone Encounter (Signed)
PA submitted and approved for crestor via covermymeds.

## 2020-12-25 ENCOUNTER — Other Ambulatory Visit: Payer: Self-pay

## 2020-12-25 ENCOUNTER — Ambulatory Visit (INDEPENDENT_AMBULATORY_CARE_PROVIDER_SITE_OTHER): Payer: BC Managed Care – PPO | Admitting: Family Medicine

## 2020-12-25 ENCOUNTER — Encounter: Payer: Self-pay | Admitting: Family Medicine

## 2020-12-25 VITALS — BP 140/70 | HR 71 | Temp 96.4°F | Ht 59.5 in | Wt 148.0 lb

## 2020-12-25 DIAGNOSIS — E782 Mixed hyperlipidemia: Secondary | ICD-10-CM | POA: Diagnosis not present

## 2020-12-25 DIAGNOSIS — Z780 Asymptomatic menopausal state: Secondary | ICD-10-CM

## 2020-12-25 DIAGNOSIS — K219 Gastro-esophageal reflux disease without esophagitis: Secondary | ICD-10-CM | POA: Diagnosis not present

## 2020-12-25 DIAGNOSIS — I10 Essential (primary) hypertension: Secondary | ICD-10-CM | POA: Diagnosis not present

## 2020-12-25 DIAGNOSIS — M791 Myalgia, unspecified site: Secondary | ICD-10-CM

## 2020-12-25 DIAGNOSIS — M2550 Pain in unspecified joint: Secondary | ICD-10-CM | POA: Diagnosis not present

## 2020-12-25 DIAGNOSIS — Z13820 Encounter for screening for osteoporosis: Secondary | ICD-10-CM

## 2020-12-25 DIAGNOSIS — E1169 Type 2 diabetes mellitus with other specified complication: Secondary | ICD-10-CM

## 2020-12-25 LAB — POCT UA - MICROALBUMIN: Microalbumin Ur, POC: 10 mg/L

## 2020-12-25 MED ORDER — LOSARTAN POTASSIUM-HCTZ 50-12.5 MG PO TABS: 1.0000 | ORAL_TABLET | Freq: Every day | ORAL | 1 refills | Status: DC

## 2020-12-25 MED ORDER — IBUPROFEN 800 MG PO TABS: 800.0000 mg | ORAL_TABLET | Freq: Three times a day (TID) | ORAL | 2 refills | Status: DC | PRN

## 2020-12-25 MED ORDER — OMEPRAZOLE 20 MG PO CPDR: 20.0000 mg | DELAYED_RELEASE_CAPSULE | Freq: Every day | ORAL | 1 refills | Status: DC

## 2020-12-25 MED ORDER — CETIRIZINE HCL 10 MG PO TABS: 10.0000 mg | ORAL_TABLET | Freq: Every day | ORAL | 3 refills | Status: DC | PRN

## 2020-12-25 NOTE — Progress Notes (Signed)
Subjective:  Patient ID: Mindy Ingram, female    DOB: 1946/06/04  Age: 75 y.o. MRN: [id number]  Chief Complaint  Patient presents with   Diabetes   Hyperlipidemia    HPI DM type 2 with diabetic mixed hyperlipidemia ([place]) Metformin XR 500 mg twice daily, Checks feet daily, FBS BID ranges from 80-200, up to date with eye exam has cataracts has appt in 12/2020 for consultation for extraction. Eats healthy most of the time. Takes extra metformin if going to [place].   Mixed hyperlipidemia Crestor (Brand)10 mg three times a week, Fish Oil 1000 mg  Gastro-esophageal reflux disease without esophagitis Omeprazole 20 mg daily  Essential hypertension Norvasc 2.5 mg daily, Losartan HCTZ 50-12.5 mg daily.  Meloxicam increases her bp. She has stopped taking it about 2 weeks ago. It really helped her joint pain.   Joint pain/muscle pain: Scheduled to see rheumatology in July. Requesting uric acid level be checked.   Current Outpatient Medications on File Prior to Visit  Medication Sig Dispense Refill   albuterol (PROVENTIL HFA;VENTOLIN HFA) 108 (90 Base) MCG/ACT inhaler Inhale 2 puffs every 4-6 hours as needed 18 g 12   amLODipine (NORVASC) 5 MG tablet TAKE 1/2 TABLET BY MOUTH DAILY (Patient taking differently: Take 2.5 mg by mouth daily as needed.) 45 tablet 1   aspirin EC 81 MG tablet Take 81 mg by mouth daily.     azelastine (ASTELIN) 0.1 % nasal spray Place 1 spray into the nose 2 (two) times daily as needed.     CHLOROPHYLL EX Apply 1 tablet topically daily.     Cholecalciferol (VITAMIN D PO) Take 2,000 Units by mouth daily.      CRESTOR 10 MG tablet ONE THREE TIMES A WEEK. 36 tablet 0   Elderberry 575 MG/5ML SYRP Take 5 mLs by mouth daily.     fluticasone (FLONASE) 50 MCG/ACT nasal spray 1-2 puffs each nostril once daily 16 g 12   Inulin (FIBER CHOICE PO) Take 1 tablet by mouth daily as needed.     Lancets (ONETOUCH DELICA PLUS [id number]) MISC CHECK BLOOD SUGAR TWICE DAILY PRIOR  TO MEAL     metFORMIN (GLUCOPHAGE-XR) 500 MG 24 hr tablet Take 500 mg by mouth in the morning and at bedtime.     metoprolol (TOPROL-XL) 200 MG 24 hr tablet TAKE 1 TABLET BY MOUTH EVERY DAY 90 tablet 0   Multiple Vitamins-Minerals (COMPLETE ENERGY) TABS Take 1 tablet by mouth daily.     nitroGLYCERIN (NITROSTAT) 0.4 MG SL tablet Place 1 tablet (0.4 mg total) under the tongue every 5 (five) minutes as needed for chest pain. 30 tablet 4   Nutritional Supplements (JUICE PLUS FIBRE PO) Take 1 tablet by mouth daily.     Omega-3 Fatty Acids (FISH OIL) 1000 MG CAPS Take 1 capsule by mouth daily.     ONETOUCH VERIO test strip 1 each by Other route daily.     traMADol (ULTRAM) 50 MG tablet Take 50 mg by mouth 2 (two) times daily as needed.     triamcinolone (KENALOG) 0.025 % ointment Apply 1 application topically 2 (two) times daily as needed.     valACYclovir (VALTREX) 1000 MG tablet Take 1,000 mg by mouth daily as needed.     No current facility-administered medications on file prior to visit.   Past Medical History:  Diagnosis Date   Allergic rhinitis    Asthma    Bronchitis    none in years   DJD (degenerative joint  disease)    Fibromyalgia    GERD (gastroesophageal reflux disease)    Hypertension    OSA (obstructive sleep apnea)    cpap setting of 10   Otitis media    both ears with impacted cerumem   Rapid heartbeat    no metoprolol lin pm   Sleep apnea    Type II or unspecified type diabetes mellitus without mention of complication, not stated as uncontrolled    Past Surgical History:  Procedure Laterality Date   [place] SURGERY  2013   lower back with plates and screws   benign lump right axilla     [place]   at [facility], slight limitation with turning neck   CHOLECYSTECTOMY  1995   EUS N/A 07/21/2013   Procedure: UPPER ENDOSCOPIC ULTRASOUND (EUS) LINEAR;  Surgeon: Milus Banister, MD;  Location: [facility]  ENDOSCOPY;  Service: Endoscopy;  Laterality: N/A;   FOOT SURGERY     2007 - right great toe, 2000 - right fifth digit .   NEUROPLASTY / TRANSPOSITION MEDIAN NERVE AT [place]   PARTIAL COLECTOMY  1995   benign adhesions   PARTIAL HYSTERECTOMY  1980s.   abdominal   right foot surgery  2000   right great toe with artificial bone inserted   right knee arthroscopy Right 02/2017    Family History  Problem Relation Age of Onset   Breast cancer Maternal Aunt    Allergies Neg Hx    Asthma Neg Hx    Eczema Neg Hx    Immunodeficiency Neg Hx    Social History   Socioeconomic History   Marital status: Widowed    Spouse name: Not on file   Number of children: 1   Years of education: Not on file   Highest education level: Not on file  Occupational History   Not on file  Tobacco Use   Smoking status: Never   Smokeless tobacco: Never  Substance and Sexual Activity   Alcohol use: No   Drug use: No   Sexual activity: Not on file  Other Topics Concern   Not on file  Social History Narrative   Not on file   Social Determinants of Health   Financial Resource Strain: Not on file  Food Insecurity: No Food Insecurity   Worried About Running Out of Food in the Last Year: Never true   Ran Out of Food in the Last Year: Never true  Transportation Needs: No Transportation Needs   Lack of Transportation (Medical): No   Lack of Transportation (Non-Medical): No  Physical Activity: Not on file  Stress: Not on file  Social Connections: Not on file    Review of Systems  Constitutional:  Negative for chills, fatigue and fever.  HENT:  Negative for congestion, ear pain, rhinorrhea and sore throat.   Respiratory:  Negative for cough and shortness of breath.   Cardiovascular:  Negative for chest pain.  Gastrointestinal:  Negative for abdominal pain, constipation, diarrhea, nausea and vomiting.  Genitourinary:  Negative for dysuria and urgency.  Musculoskeletal:  Positive  for arthralgias, back pain and myalgias.  Neurological:  Negative for dizziness, weakness, light-headedness and headaches.  Psychiatric/Behavioral:  Negative for dysphoric mood. The patient is not nervous/anxious.     Objective:  BP 140/70   Pulse 71   Temp (!) 96.4 F (35.8 C)   Ht 4' 11.5" (1.511 m)  Wt 148 lb (67.1 kg)   SpO2 97%   BMI 29.39 kg/m   BP/Weight 12/25/2020 09/18/2020 63/06/6008  Systolic BP 932 355 732  Diastolic BP 70 60 72  Wt. (Lbs) 148 147.6 146.8  BMI 29.39 30.85 29.65    Physical Exam Vitals reviewed.  Constitutional:      Appearance: Normal appearance. She is normal weight.  Neck:     Vascular: No carotid bruit.  Cardiovascular:     Rate and Rhythm: Normal rate and regular rhythm.     Pulses: Normal pulses.     Heart sounds: Normal heart sounds.  Pulmonary:     Effort: Pulmonary effort is normal. No respiratory distress.     Breath sounds: Normal breath sounds.  Abdominal:     General: Abdomen is flat. Bowel sounds are normal.     Palpations: Abdomen is soft.     Tenderness: There is no abdominal tenderness.  Musculoskeletal:        General: No tenderness. Normal range of motion.  Neurological:     Mental Status: She is alert and oriented to person, place, and time.  Psychiatric:        Mood and Affect: Mood normal.        Behavior: Behavior normal.    Diabetic Foot Exam - Simple   Simple Foot Form Visual Inspection See comments: Yes Sensation Testing Intact to touch and monofilament testing bilaterally: Yes Pulse Check Posterior Tibialis and Dorsalis pulse intact bilaterally: Yes Comments Hammer toe Rt 2nd and 3rd toe      Lab Results  Component Value Date   WBC 4.6 09/20/2020   HGB 13.4 09/20/2020   HCT 40.0 09/20/2020   PLT 283 09/20/2020   GLUCOSE 109 (H) 09/20/2020   CHOL 171 09/20/2020   TRIG 102 09/20/2020   HDL 58 09/20/2020   LDLCALC 95 09/20/2020   ALT 10 09/20/2020   AST 20 09/20/2020   NA 145 (H) 09/20/2020    K 4.2 09/20/2020   CL 102 09/20/2020   CREATININE 0.90 09/20/2020   BUN 13 09/20/2020   CO2 24 09/20/2020   TSH 1.450 09/20/2020   HGBA1C 6.7 (H) 09/20/2020   MICROALBUR 10 12/25/2020      Assessment & Plan:   1. DM type 2 with diabetic mixed hyperlipidemia (H[facility]) Control: good Recommend check sugars fasting daily. Recommend check feet daily. Recommend annual eye exams. Medicines: no changes Continue to work on eating a healthy diet and exercise.  Labs drawn today.   - CBC with Differential/Platelet - Hemoglobin A1c - POCT UA - Microalbumin  2. Mixed hyperlipidemia Well controlled.  No changes to medicines.  Continue to work on eating a healthy diet and exercise.  Labs drawn today.  - Lipid panel  3. Gastro-esophageal reflux disease without esophagitis The current medical regimen is effective;  continue present plan and medications.  4. Essential hypertension The current medical regimen is effective;  continue present plan and medications. Stop meloxicam.  - Comprehensive metabolic panel  5. Arthralgia, unspecified joint Keep follow up with Dr. Amil Amen.  - Uric acid  6. Myalgia Recommend coenzyme q 10.  - Magnesium - CK - Phosphorus  8. Encounter for osteoporosis screening in asymptomatic postmenopausal patient - DG Bone Density    Meds ordered this encounter  Medications   cetirizine (ZYRTEC) 10 MG tablet    Sig: Take 1 tablet (10 mg total) by mouth daily as needed.    Dispense:  90 tablet    Refill:  3   omeprazole (PRILOSEC) 20 MG capsule    Sig: Take 1 capsule (20 mg total) by mouth daily.    Dispense:  90 capsule    Refill:  1   losartan-hydrochlorothiazide (HYZAAR) 50-12.5 MG tablet    Sig: Take 1 tablet by mouth daily.    Dispense:  90 tablet    Refill:  1   ibuprofen (ADVIL) 800 MG tablet    Sig: Take 1 tablet (800 mg total) by mouth every 8 (eight) hours as needed.    Dispense:  90 tablet    Refill:  2     Orders Placed This  Encounter  Procedures   DG Bone Density   CBC with Differential/Platelet   Comprehensive metabolic panel   Hemoglobin A1c   Lipid panel   Uric acid   Magnesium   CK   Phosphorus   POCT UA - Microalbumin      Follow-up: Return in about 3 months (around 03/27/2021) for fasting.  An After Visit Summary was printed and given to the patient.  [name], MD [facility] [phone]

## 2020-12-25 NOTE — Patient Instructions (Addendum)
STOP taking meloxicam (mobic) and START ibuprofen 800 mg Three times a day. DO NOT take both. Take tylenol for break through pain.  START Co-Q 10-can purchase at [facility] or any other drug store.

## 2020-12-26 LAB — LIPID PANEL
Chol/HDL Ratio: 3.1 ratio (ref 0.0–4.4)
Cholesterol, Total: 162 mg/dL (ref 100–199)
HDL: 53 mg/dL (ref >39)
LDL Chol Calc (NIH): 86 mg/dL (ref 0–99)
Triglycerides: 129 mg/dL (ref 0–149)
VLDL Cholesterol Cal: 23 mg/dL (ref 5–40)

## 2020-12-26 LAB — CBC WITH DIFFERENTIAL/PLATELET
Basophils Absolute: 0 10*3/uL (ref 0.0–0.2)
Basos: 1 %
EOS (ABSOLUTE): 0.1 10*3/uL (ref 0.0–0.4)
Eos: 1 %
Hematocrit: 40.8 % (ref 34.0–46.6)
Hemoglobin: 13.2 g/dL (ref 11.1–15.9)
Immature Grans (Abs): 0 10*3/uL (ref 0.0–0.1)
Immature Granulocytes: 0 %
Lymphocytes Absolute: 2 10*3/uL (ref 0.7–3.1)
Lymphs: 37 %
MCH: 30.5 pg (ref 26.6–33.0)
MCHC: 32.4 g/dL (ref 31.5–35.7)
MCV: 94 fL (ref 79–97)
Monocytes Absolute: 0.5 10*3/uL (ref 0.1–0.9)
Monocytes: 9 %
Neutrophils Absolute: 2.8 10*3/uL (ref 1.4–7.0)
Neutrophils: 52 %
Platelets: 265 10*3/uL (ref 150–450)
RBC: 4.33 x10E6/uL (ref 3.77–5.28)
RDW: 12.8 % (ref 11.7–15.4)
WBC: 5.4 10*3/uL (ref 3.4–10.8)

## 2020-12-26 LAB — COMPREHENSIVE METABOLIC PANEL
ALT: 14 IU/L (ref 0–32)
AST: 15 IU/L (ref 0–40)
Albumin/Globulin Ratio: 1.9 (ref 1.2–2.2)
Albumin: 4.5 g/dL (ref 3.7–4.7)
Alkaline Phosphatase: 47 IU/L (ref 44–121)
BUN/Creatinine Ratio: 12 (ref 12–28)
BUN: 10 mg/dL (ref 8–27)
Bilirubin Total: 0.4 mg/dL (ref 0.0–1.2)
CO2: 26 mmol/L (ref 20–29)
Calcium: 9.9 mg/dL (ref 8.7–10.3)
Chloride: 103 mmol/L (ref 96–106)
Creatinine, Ser: 0.81 mg/dL (ref 0.57–1.00)
Globulin, Total: 2.4 g/dL (ref 1.5–4.5)
Glucose: 115 mg/dL — ABNORMAL HIGH (ref 65–99)
Potassium: 3.8 mmol/L (ref 3.5–5.2)
Sodium: 146 mmol/L — ABNORMAL HIGH (ref 134–144)
Total Protein: 6.9 g/dL (ref 6.0–8.5)
eGFR: 76 mL/min/{1.73_m2} (ref >59)

## 2020-12-26 LAB — CARDIOVASCULAR RISK ASSESSMENT

## 2020-12-26 LAB — URIC ACID: Uric Acid: 5.6 mg/dL (ref 3.1–7.9)

## 2020-12-26 LAB — MAGNESIUM: Magnesium: 1.8 mg/dL (ref 1.6–2.3)

## 2020-12-26 LAB — CK: Total CK: 138 U/L (ref 32–182)

## 2020-12-26 LAB — PHOSPHORUS: Phosphorus: 4.4 mg/dL — ABNORMAL HIGH (ref 3.0–4.3)

## 2020-12-26 LAB — HEMOGLOBIN A1C
Est. average glucose Bld gHb Est-mCnc: 148 mg/dL
Hgb A1c MFr Bld: 6.8 % — ABNORMAL HIGH (ref 4.8–5.6)

## 2020-12-27 ENCOUNTER — Ambulatory Visit: Payer: BC Managed Care – PPO | Admitting: Family Medicine

## 2020-12-27 NOTE — Progress Notes (Signed)
Called [facility]- pharmacist stated she had gotten Crestor on 12/13/20 with prior authorization that was done for her.   I have notified Donette Larry CPP.  Clarita Leber, [place] Pharmacist Assistant [phone]

## 2021-01-01 DIAGNOSIS — H25011 Cortical age-related cataract, right eye: Secondary | ICD-10-CM | POA: Diagnosis not present

## 2021-01-01 DIAGNOSIS — H18413 Arcus senilis, bilateral: Secondary | ICD-10-CM | POA: Diagnosis not present

## 2021-01-01 DIAGNOSIS — H2511 Age-related nuclear cataract, right eye: Secondary | ICD-10-CM | POA: Diagnosis not present

## 2021-01-01 DIAGNOSIS — Z961 Presence of intraocular lens: Secondary | ICD-10-CM | POA: Diagnosis not present

## 2021-01-01 LAB — HM DIABETES EYE EXAM

## 2021-01-02 ENCOUNTER — Other Ambulatory Visit: Payer: Self-pay | Admitting: Family Medicine

## 2021-01-02 DIAGNOSIS — Z1231 Encounter for screening mammogram for malignant neoplasm of breast: Secondary | ICD-10-CM

## 2021-01-07 ENCOUNTER — Telehealth: Payer: Self-pay | Admitting: Family Medicine

## 2021-01-07 NOTE — Telephone Encounter (Signed)
   Mindy Ingram has been scheduled for the following appointment:  WHAT: DEXA WHERE: [facility] DATE: 02/08/21 TIME: 8:00am arrival time  Patient has been made aware.

## 2021-01-08 DIAGNOSIS — H2513 Age-related nuclear cataract, bilateral: Secondary | ICD-10-CM | POA: Diagnosis not present

## 2021-01-08 DIAGNOSIS — R768 Other specified abnormal immunological findings in serum: Secondary | ICD-10-CM | POA: Diagnosis not present

## 2021-01-08 DIAGNOSIS — M150 Primary generalized (osteo)arthritis: Secondary | ICD-10-CM | POA: Diagnosis not present

## 2021-01-08 DIAGNOSIS — M797 Fibromyalgia: Secondary | ICD-10-CM | POA: Diagnosis not present

## 2021-01-08 DIAGNOSIS — M5136 Other intervertebral disc degeneration, lumbar region: Secondary | ICD-10-CM | POA: Diagnosis not present

## 2021-01-08 DIAGNOSIS — M2041 Other hammer toe(s) (acquired), right foot: Secondary | ICD-10-CM | POA: Diagnosis not present

## 2021-01-08 DIAGNOSIS — E119 Type 2 diabetes mellitus without complications: Secondary | ICD-10-CM | POA: Diagnosis not present

## 2021-01-22 ENCOUNTER — Other Ambulatory Visit: Payer: Self-pay

## 2021-01-23 ENCOUNTER — Other Ambulatory Visit: Payer: Self-pay

## 2021-02-14 ENCOUNTER — Ambulatory Visit: Payer: BC Managed Care – PPO | Admitting: Internal Medicine

## 2021-02-17 ENCOUNTER — Other Ambulatory Visit: Payer: Self-pay | Admitting: Family Medicine

## 2021-02-19 ENCOUNTER — Other Ambulatory Visit: Payer: Self-pay | Admitting: Family Medicine

## 2021-02-20 DIAGNOSIS — Z13820 Encounter for screening for osteoporosis: Secondary | ICD-10-CM | POA: Diagnosis not present

## 2021-02-25 NOTE — Progress Notes (Signed)
Acute Office Visit  Subjective:    Patient ID: Mindy Ingram, female    DOB: Oct 01, 1945, 75 y.o.   MRN: [id number]  Chief Complaint  Patient presents with   Dizziness    HPI Patient is in today for vertigo related to moving her head in bed. Last for about 5 minutes and resolves. Started few days ago.   Past Medical History:  Diagnosis Date   Allergic rhinitis    Asthma    Bronchitis    none in years   DJD (degenerative joint disease)    Fibromyalgia    GERD (gastroesophageal reflux disease)    Hypertension    OSA (obstructive sleep apnea)    cpap setting of 10   Otitis media    both ears with impacted cerumem   Rapid heartbeat    no metoprolol lin pm   Sleep apnea    Type II or unspecified type diabetes mellitus without mention of complication, not stated as uncontrolled     Past Surgical History:  Procedure Laterality Date   [place]  2013   lower back with plates and screws   benign lump right axilla     BILATERAL SALPINGOOPHORECTOMY  [place]   at [facility], slight limitation with turning neck   CHOLECYSTECTOMY  1995   EUS N/A 07/21/2013   Procedure: UPPER ENDOSCOPIC ULTRASOUND (EUS) LINEAR;  Surgeon: Milus Banister, MD;  Location: [facility] ENDOSCOPY;  Service: Endoscopy;  Laterality: N/A;   FOOT SURGERY     2007 - right great toe, 2000 - right fifth digit .   NEUROPLASTY / TRANSPOSITION MEDIAN NERVE AT [place]   PARTIAL COLECTOMY  1995   benign adhesions   PARTIAL HYSTERECTOMY  1980s.   abdominal   right foot surgery  2000   right great toe with artificial bone inserted   right knee arthroscopy Right 02/2017    Family History  Problem Relation Age of Onset   Breast cancer Maternal Aunt    Allergies Neg Hx    Asthma Neg Hx    Eczema Neg Hx    Immunodeficiency Neg Hx     Social History   Socioeconomic History   Marital status: Widowed    Spouse name: Not on file   Number  of children: 1   Years of education: Not on file   Highest education level: Not on file  Occupational History   Not on file  Tobacco Use   Smoking status: Never   Smokeless tobacco: Never  Substance and Sexual Activity   Alcohol use: No   Drug use: No   Sexual activity: Not on file  Other Topics Concern   Not on file  Social History Narrative   Not on file   Social Determinants of Health   Financial Resource Strain: Not on file  Food Insecurity: No Food Insecurity   Worried About Running Out of Food in the Last Year: Never true   Ran Out of Food in the Last Year: Never true  Transportation Needs: No Transportation Needs   Lack of Transportation (Medical): No   Lack of Transportation (Non-Medical): No  Physical Activity: Not on file  Stress: Not on file  Social Connections: Not on file  Intimate Partner Violence: Not on file    Outpatient Medications Prior to Visit  Medication Sig Dispense Refill   acyclovir (ZOVIRAX) 200 MG capsule Take 200 mg by  mouth 5 (five) times daily.     albuterol (PROVENTIL HFA;VENTOLIN HFA) 108 (90 Base) MCG/ACT inhaler Inhale 2 puffs every 4-6 hours as needed 18 g 12   amLODipine (NORVASC) 5 MG tablet TAKE 1/2 TABLET BY MOUTH DAILY (Patient taking differently: Take 2.5 mg by mouth daily as needed.) 45 tablet 1   aspirin EC 81 MG tablet Take 81 mg by mouth daily.     azelastine (ASTELIN) 0.1 % nasal spray Place 1 spray into the nose 2 (two) times daily as needed.     cetirizine (ZYRTEC) 10 MG tablet Take 1 tablet (10 mg total) by mouth daily as needed. 90 tablet 3   CHLOROPHYLL EX Apply 1 tablet topically daily.     Cholecalciferol (VITAMIN D PO) Take 2,000 Units by mouth daily.      fluticasone (FLONASE) 50 MCG/ACT nasal spray 1-2 puffs each nostril once daily 16 g 12   ibuprofen (ADVIL) 800 MG tablet Take 1 tablet (800 mg total) by mouth every 8 (eight) hours as needed. 90 tablet 2   Inulin (FIBER CHOICE PO) Take 1 tablet by mouth daily as  needed.     Lancets (ONETOUCH DELICA PLUS [id number]) MISC CHECK BLOOD SUGAR TWICE DAILY PRIOR TO MEAL     losartan-hydrochlorothiazide (HYZAAR) 50-12.5 MG tablet Take 1 tablet by mouth daily. 90 tablet 1   metFORMIN (GLUCOPHAGE-XR) 500 MG 24 hr tablet TAKE 2 TABLETS EVERY MORNING, AND 1 TABLET IN THE EVENING (Patient taking differently: Take 500 mg by mouth 2 (two) times daily.) 270 tablet 0   metoprolol (TOPROL-XL) 200 MG 24 hr tablet TAKE 1 TABLET BY MOUTH EVERY DAY 90 tablet 0   Multiple Vitamins-Minerals (COMPLETE ENERGY) TABS Take 1 tablet by mouth daily.     nitroGLYCERIN (NITROSTAT) 0.4 MG SL tablet Place 1 tablet (0.4 mg total) under the tongue every 5 (five) minutes as needed for chest pain. 30 tablet 4   Omega-3 Fatty Acids (FISH OIL) 1000 MG CAPS Take 1 capsule by mouth daily.     omeprazole (PRILOSEC) 20 MG capsule Take 1 capsule (20 mg total) by mouth daily. 90 capsule 1   ONETOUCH VERIO test strip 1 each by Other route daily.     traMADol (ULTRAM) 50 MG tablet Take 50 mg by mouth 2 (two) times daily as needed.     triamcinolone (KENALOG) 0.025 % ointment Apply 1 application topically 2 (two) times daily as needed.     CRESTOR 10 MG tablet ONE THREE TIMES A WEEK. (Patient not taking: Reported on 02/26/2021) 36 tablet 0   Elderberry 575 MG/5ML SYRP Take 5 mLs by mouth daily. (Patient not taking: Reported on 02/26/2021)     Nutritional Supplements (JUICE PLUS FIBRE PO) Take 1 tablet by mouth daily. (Patient not taking: Reported on 02/26/2021)     valACYclovir (VALTREX) 1000 MG tablet Take 1,000 mg by mouth daily as needed. (Patient not taking: Reported on 02/26/2021)     No facility-administered medications prior to visit.    Allergies  Allergen Reactions   Dairy Aid [Lactase]     Hives, diarrhea   Lactose Anaphylaxis and Other (See Comments)   Atorvastatin Other (See Comments)    Severe myalgias   Compazine [Prochlorperazine Edisylate]    Hydrocodone-Acetaminophen     REACTION:  sore throat with vicodin   Hydrocodone-Acetaminophen Other (See Comments)    Other reaction(s): Other (See Comments) Sore throat , like it is closing up on her REACTION: sore throat with vicodin  Milk-Related Compounds Other (See Comments)   Myrbetriq [Mirabegron]     Headache    Other Other (See Comments)    Other reaction(s): headache Other reaction(s): confused   Oxycontin [Oxycodone Hcl]     "makes me crazy"   Prochlorperazine Other (See Comments)   Prochlorperazine Edisylate     REACTION: stroke-like symptoms with companzine   Rosuvastatin     Can take brand crestor-severe myalgias   Shellfish Allergy    Shellfish-Derived Products Other (See Comments)   Umeclidinium-Vilanterol Other (See Comments)    Lactose allergy   Percodan [Oxycodone-Aspirin] Palpitations    Fast heartbeat with percodan    Review of Systems  Constitutional:  Negative for chills and fever.  HENT:  Negative for rhinorrhea and sore throat.   Respiratory:  Negative for shortness of breath.   Cardiovascular:  Negative for chest pain.  Genitourinary:  Negative for dysuria.  Neurological:  Positive for dizziness. Negative for weakness and headaches.  Psychiatric/Behavioral:  Negative for dysphoric mood. The patient is not nervous/anxious.       Objective:    Physical Exam Vitals reviewed.  Constitutional:      Appearance: Normal appearance. She is normal weight.  Cardiovascular:     Rate and Rhythm: Normal rate and regular rhythm.     Heart sounds: Normal heart sounds.  Pulmonary:     Effort: Pulmonary effort is normal. No respiratory distress.     Breath sounds: Normal breath sounds.  Neurological:     Mental Status: She is alert and oriented to person, place, and time.     Comments: Positive epley maneuver towards right.   Psychiatric:        Mood and Affect: Mood normal.        Behavior: Behavior normal.    BP (!) 144/80   Pulse 78   Temp (!) 97.4 F (36.3 C)   Resp 16   Ht 4'  11.5" (1.511 m)   Wt 146 lb (66.2 kg)   BMI 28.99 kg/m  Wt Readings from Last 3 Encounters:  02/26/21 146 lb (66.2 kg)  12/25/20 148 lb (67.1 kg)  09/18/20 147 lb 9.6 oz (67 kg)    Health Maintenance Due  Topic Date Due   OPHTHALMOLOGY EXAM  Never done   Hepatitis C Screening  Never done   Zoster Vaccines- Shingrix (1 of 2) Never done   COLONOSCOPY (Pts 45-68yr Insurance coverage will need to be confirmed)  Never done   DEXA SCAN  Never done   COVID-19 Vaccine (4 - Booster for Moderna series) 07/26/2020   INFLUENZA VACCINE  01/28/2021    There are no preventive care reminders to display for this patient.   Lab Results  Component Value Date   TSH 1.450 09/20/2020   Lab Results  Component Value Date   WBC 5.4 12/25/2020   HGB 13.2 12/25/2020   HCT 40.8 12/25/2020   MCV 94 12/25/2020   PLT 265 12/25/2020   Lab Results  Component Value Date   NA 146 (H) 12/25/2020   K 3.8 12/25/2020   CO2 26 12/25/2020   GLUCOSE 115 (H) 12/25/2020   BUN 10 12/25/2020   CREATININE 0.81 12/25/2020   BILITOT 0.4 12/25/2020   ALKPHOS 47 12/25/2020   AST 15 12/25/2020   ALT 14 12/25/2020   PROT 6.9 12/25/2020   ALBUMIN 4.5 12/25/2020   CALCIUM 9.9 12/25/2020   EGFR 76 12/25/2020   Lab Results  Component Value Date   CHOL 162 12/25/2020  Lab Results  Component Value Date   HDL 53 12/25/2020   Lab Results  Component Value Date   LDLCALC 86 12/25/2020   Lab Results  Component Value Date   TRIG 129 12/25/2020   Lab Results  Component Value Date   CHOLHDL 3.1 12/25/2020   Lab Results  Component Value Date   HGBA1C 6.8 (H) 12/25/2020       Assessment & Plan:  1. BPPV (benign paroxysmal positional vertigo), right  Epley maneuver performed and positive on the right side.  Educated patient and doing maneuver her self and on BPPV in general.   Follow-up: Return if symptoms worsen or fail to improve.  An After Visit Summary was printed and given to the  patient.  [name], MD [facility] [phone]

## 2021-02-26 ENCOUNTER — Encounter: Payer: Self-pay | Admitting: Family Medicine

## 2021-02-26 ENCOUNTER — Ambulatory Visit (INDEPENDENT_AMBULATORY_CARE_PROVIDER_SITE_OTHER): Payer: Medicare Other | Admitting: Family Medicine

## 2021-02-26 ENCOUNTER — Other Ambulatory Visit: Payer: Self-pay

## 2021-02-26 VITALS — BP 144/80 | HR 78 | Temp 97.4°F | Resp 16 | Ht 59.5 in | Wt 146.0 lb

## 2021-02-26 DIAGNOSIS — H8111 Benign paroxysmal vertigo, right ear: Secondary | ICD-10-CM | POA: Diagnosis not present

## 2021-02-26 NOTE — Patient Instructions (Signed)
Benign Positional Vertigo Vertigo is the feeling that you or your surroundings are moving when they are not. Benign positional vertigo is the most common form of vertigo. This is usually a harmless condition (benign). This condition is positional. This means that symptoms are triggered bycertain movements and positions. This condition can be dangerous if it occurs while you are doing something that could cause harm to yourself or others. This includes activities such asdriving or operating machinery. What are the causes? The inner ear has fluid-filled canals that help your brain sense movement and balance. When the fluid moves, the brain receives messages about your body'sposition. With benign positional vertigo, calcium crystals in the inner ear break free and disturb the inner ear area. This causes your brain to receive confusingmessages about your body's position. What increases the risk? You are more likely to develop this condition if: You are a woman. You are 75 years of age or older. You have recently had a head injury. You have an inner ear disease. What are the signs or symptoms? Symptoms of this condition usually happen when you move your head or your eyes in different directions. Symptoms may start suddenly and usually last for less than a minute. They include: Loss of balance and falling. Feeling like you are spinning or moving. Feeling like your surroundings are spinning or moving. Nausea and vomiting. Blurred vision. Dizziness. Involuntary eye movement (nystagmus). Symptoms can be mild and cause only minor problems, or they can be severe and interfere with daily life. Episodes of benign positional vertigo may return (recur) over time. Symptoms may also improve over time. How is this diagnosed? This condition may be diagnosed based on: Your medical history. A physical exam of the head, neck, and ears. Positional tests to check for or stimulate vertigo. You may be asked to  turn your head and change positions, such as going from sitting to lying down. A health care provider will watch for symptoms of vertigo. You may be referred to a health care provider who specializes in ear, nose, and throat problems (ENT or otolaryngologist) or a provider who specializes in disorders of the nervous system (neurologist). How is this treated?  This condition may be treated in a session in which your health care provider moves your head in specific positions to help the displaced crystals in your inner ear move. Treatment for this condition may take several sessions. Surgerymay be needed in severe cases, but this is rare. In some cases, benign positional vertigo may resolve on its own in 2-4 weeks. Follow these instructions at home: Safety Move slowly. Avoid sudden body or head movements or certain positions, as told by your health care provider. Avoid driving or operating machinery until your health care provider says it is safe. Avoid doing any tasks that would be dangerous to you or others if vertigo occurs. If you have trouble walking or keeping your balance, try using a cane for stability. If you feel dizzy or unstable, sit down right away. Return to your normal activities as told by your health care provider. Ask your health care provider what activities are safe for you. General instructions Take over-the-counter and prescription medicines only as told by your health care provider. Drink enough fluid to keep your urine pale yellow. Keep all follow-up visits. This is important. Contact a health care provider if: You have a fever. Your condition gets worse or you develop new symptoms. Your family or friends notice any behavioral changes. You have nausea or  vomiting that gets worse. You have numbness or a prickling and tingling sensation. Get help right away if you: Have difficulty speaking or moving. Are always dizzy or faint. Develop severe headaches. Have weakness in  your legs or arms. Have changes in your hearing or vision. Develop a stiff neck. Develop sensitivity to light. These symptoms may represent a serious problem that is an emergency. Do not wait to see if the symptoms will go away. Get medical help right away. Call your local emergency services (911 in the [place] Do not drive yourself to the hospital. Summary Vertigo is the feeling that you or your surroundings are moving when they are not. Benign positional vertigo is the most common form of vertigo. This condition is caused by calcium crystals in the inner ear that become displaced. This causes a disturbance in an area of the inner ear that helps your brain sense movement and balance. Symptoms include loss of balance and falling, feeling that you or your surroundings are moving, nausea and vomiting, and blurred vision. This condition can be diagnosed based on symptoms, a physical exam, and positional tests. Follow safety instructions as told by your health care provider and keep all follow-up visits. This is important. This information is not intended to replace advice given to you by your health care provider. Make sure you discuss any questions you have with your healthcare provider. How to Perform the Epley Maneuver The Epley maneuver is an exercise that relieves symptoms of vertigo. Vertigo is the feeling that you or your surroundings are moving when they are not. When you feel vertigo, you may feel like the room is spinning and may have trouble walking. The Epley maneuver is used for a type of vertigo caused by a calcium deposit in a part of the inner ear. The maneuver involves changing headpositions to help the deposit move out of the area. You can do this maneuver at home whenever you have symptoms of vertigo. You canrepeat it in 24 hours if your vertigo has not gone away. Even though the Epley maneuver may relieve your vertigo for a few weeks, it is possible that your symptoms will return. This  maneuver relieves vertigo, but itdoes not relieve dizziness. What are the risks? If it is done correctly, the Epley maneuver is considered safe. Sometimes it can lead to dizziness or nausea that goes away after a short time. If you develop other symptoms--such as changes in vision, weakness, or numbness--stopdoing the maneuver and call your health care provider. Supplies needed: A bed or table. A pillow. How to do the Epley maneuver     Sit on the edge of a bed or table with your back straight and your legs extended or hanging over the edge of the bed or table. Turn your head halfway toward the affected ear or side as told by your health care provider. Lie backward quickly with your head turned until you are lying flat on your back. Your head should dangle (head-hanging position). You may want to position a pillow under your shoulders. Hold this position for at least 30 seconds. If you feel dizzy or have symptoms of vertigo, continue to hold the position until the symptoms stop. Turn your head to the opposite direction until your unaffected ear is facing down. Your head should continue to dangle. Hold this position for at least 30 seconds. If you feel dizzy or have symptoms of vertigo, continue to hold the position until the symptoms stop. Turn your whole body to  the same side as your head so that you are positioned on your side. Your head will now be nearly facedown and no longer needs to dangle. Hold for at least 30 seconds. If you feel dizzy or have symptoms of vertigo, continue to hold the position until the symptoms stop. Sit back up. You can repeat the maneuver in 24 hours if your vertigo does not go away. Follow these instructions at home: For 24 hours after doing the Epley maneuver: Keep your head in an upright position. When lying down to sleep or rest, keep your head raised (elevated) with two or more pillows. Avoid excessive neck movements. Activity Do not drive or use machinery  if you feel dizzy. After doing the Epley maneuver, return to your normal activities as told by your health care provider. Ask your health care provider what activities are safe for you. General instructions Drink enough fluid to keep your urine pale yellow. Do not drink alcohol. Take over-the-counter and prescription medicines only as told by your health care provider. Keep all follow-up visits. This is important. Preventing vertigo symptoms Ask your health care provider if there is anything you should do at home to prevent vertigo. He or she may recommend that you: Keep your head elevated with two or more pillows while you sleep. Do not sleep on the side of your affected ear. Get up slowly from bed. Avoid sudden movements during the day. Avoid extreme head positions or movement, such as looking up or bending over. Contact a health care provider if: Your vertigo gets worse. You have other symptoms, including: Nausea. Vomiting. Headache. Get help right away if you: Have vision changes. Have a headache or neck pain that is severe or getting worse. Cannot stop vomiting. Have new numbness or weakness in any part of your body. These symptoms may represent a serious problem that is an emergency. Do not wait to see if the symptoms will go away. Get medical help right away. Call your local emergency services (911 in the [place] Do not drive yourself to the hospital. Summary Vertigo is the feeling that you or your surroundings are moving when they are not. The Epley maneuver is an exercise that relieves symptoms of vertigo. If the Epley maneuver is done correctly, it is considered safe. This information is not intended to replace advice given to you by your health care provider. Make sure you discuss any questions you have with your healthcare provider. Document Revised: 05/16/2020 Document Reviewed: 05/16/2020 Elsevier Patient Education  2022 [place] Revised: 05/16/2020  Document Reviewed: 05/16/2020 Elsevier Patient Education  2022 [employer].

## 2021-03-13 DIAGNOSIS — H2511 Age-related nuclear cataract, right eye: Secondary | ICD-10-CM | POA: Diagnosis not present

## 2021-03-18 ENCOUNTER — Ambulatory Visit (INDEPENDENT_AMBULATORY_CARE_PROVIDER_SITE_OTHER): Payer: Medicare Other | Admitting: Family Medicine

## 2021-03-18 ENCOUNTER — Other Ambulatory Visit: Payer: Self-pay

## 2021-03-18 ENCOUNTER — Encounter: Payer: Self-pay | Admitting: Family Medicine

## 2021-03-18 VITALS — BP 136/78 | HR 96 | Temp 97.5°F | Resp 16 | Wt 145.2 lb

## 2021-03-18 DIAGNOSIS — M7711 Lateral epicondylitis, right elbow: Secondary | ICD-10-CM

## 2021-03-18 DIAGNOSIS — S0003XA Contusion of scalp, initial encounter: Secondary | ICD-10-CM | POA: Insufficient documentation

## 2021-03-18 DIAGNOSIS — S0093XA Contusion of unspecified part of head, initial encounter: Secondary | ICD-10-CM

## 2021-03-18 DIAGNOSIS — R42 Dizziness and giddiness: Secondary | ICD-10-CM | POA: Diagnosis not present

## 2021-03-18 HISTORY — DX: Contusion of unspecified part of head, initial encounter: S00.93XA

## 2021-03-18 HISTORY — DX: Dizziness and giddiness: R42

## 2021-03-18 HISTORY — DX: Contusion of scalp, initial encounter: S00.03XA

## 2021-03-18 NOTE — Progress Notes (Signed)
Acute Office Visit  Subjective:    Patient ID: Mindy Ingram, female    DOB: 09-Nov-1945, 75 y.o.   MRN: [id number]  Chief Complaint  Patient presents with   head contussion   Arm Pain    Right arm     HPI Patient is in today for head contusion. Her hair dresser dropped her phone on her forehead. Lightheaded: comes and go. Vertigo, which preceded the head injury, greatly improved with exercises. Her head does not hurt unless specific spot where phone hit is touched   Right arm: swollen and pain radiates up from wrist to shoulder. Previously had this and was told it was tennis elbow.  Past Medical History:  Diagnosis Date   Allergic rhinitis    Asthma    Bronchitis    none in years   DJD (degenerative joint disease)    Fibromyalgia    GERD (gastroesophageal reflux disease)    Hypertension    OSA (obstructive sleep apnea)    cpap setting of 10   Otitis media    both ears with impacted cerumem   Rapid heartbeat    no metoprolol lin pm   Sleep apnea    Type II or unspecified type diabetes mellitus without mention of complication, not stated as uncontrolled     Past Surgical History:  Procedure Laterality Date   [place]  2013   lower back with plates and screws   benign lump right axilla     BILATERAL SALPINGOOPHORECTOMY  [place]   at [facility], slight limitation with turning neck   CHOLECYSTECTOMY  1995   EUS N/A 07/21/2013   Procedure: UPPER ENDOSCOPIC ULTRASOUND (EUS) LINEAR;  Surgeon: Milus Banister, MD;  Location: [facility];  Service: Endoscopy;  Laterality: N/A;   FOOT SURGERY     2007 - right great toe, 2000 - right fifth digit .   NEUROPLASTY / TRANSPOSITION MEDIAN NERVE AT [facility]   PARTIAL COLECTOMY  1995   benign adhesions   PARTIAL HYSTERECTOMY  1980s.   abdominal   right foot surgery  2000   right great toe with artificial bone inserted   right knee arthroscopy Right  02/2017    Family History  Problem Relation Age of Onset   Breast cancer Maternal Aunt    Allergies Neg Hx    Asthma Neg Hx    Eczema Neg Hx    Immunodeficiency Neg Hx     Social History   Socioeconomic History   Marital status: Widowed    Spouse name: Not on file   Number of children: 1   Years of education: Not on file   Highest education level: Not on file  Occupational History   Not on file  Tobacco Use   Smoking status: Never   Smokeless tobacco: Never  Substance and Sexual Activity   Alcohol use: No   Drug use: No   Sexual activity: Not on file  Other Topics Concern   Not on file  Social History Narrative   Not on file   Social Determinants of Health   Financial Resource Strain: Not on file  Food Insecurity: No Food Insecurity   Worried About Running Out of Food in the Last Year: Never true   Ran Out of Food in the Last Year: Never true  Transportation Needs: No Transportation Needs   Lack of Transportation (Medical): No   Lack  of Transportation (Non-Medical): No  Physical Activity: Not on file  Stress: Not on file  Social Connections: Not on file  Intimate Partner Violence: Not on file    Outpatient Medications Prior to Visit  Medication Sig Dispense Refill   acyclovir (ZOVIRAX) 200 MG capsule Take 200 mg by mouth 5 (five) times daily.     albuterol (PROVENTIL HFA;VENTOLIN HFA) 108 (90 Base) MCG/ACT inhaler Inhale 2 puffs every 4-6 hours as needed 18 g 12   amLODipine (NORVASC) 5 MG tablet TAKE 1/2 TABLET BY MOUTH DAILY (Patient taking differently: Take 2.5 mg by mouth daily as needed.) 45 tablet 1   aspirin EC 81 MG tablet Take 81 mg by mouth daily.     azelastine (ASTELIN) 0.1 % nasal spray Place 1 spray into the nose 2 (two) times daily as needed.     BESIVANCE 0.6 % SUSP Place 1 drop into the right eye 3 (three) times daily.     cetirizine (ZYRTEC) 10 MG tablet Take 1 tablet (10 mg total) by mouth daily as needed. 90 tablet 3   CHLOROPHYLL EX Apply  1 tablet topically daily.     Cholecalciferol (VITAMIN D PO) Take 2,000 Units by mouth daily.      CRESTOR 10 MG tablet ONE THREE TIMES A WEEK. (Patient not taking: Reported on 02/26/2021) 36 tablet 0   Elderberry 575 MG/5ML SYRP Take 5 mLs by mouth daily. (Patient not taking: Reported on 02/26/2021)     fluticasone (FLONASE) 50 MCG/ACT nasal spray 1-2 puffs each nostril once daily 16 g 12   ibuprofen (ADVIL) 800 MG tablet Take 1 tablet (800 mg total) by mouth every 8 (eight) hours as needed. 90 tablet 2   Inulin (FIBER CHOICE PO) Take 1 tablet by mouth daily as needed.     Lancets (ONETOUCH DELICA PLUS [id number]) MISC CHECK BLOOD SUGAR TWICE DAILY PRIOR TO MEAL     losartan-hydrochlorothiazide (HYZAAR) 50-12.5 MG tablet Take 1 tablet by mouth daily. 90 tablet 1   metFORMIN (GLUCOPHAGE-XR) 500 MG 24 hr tablet TAKE 2 TABLETS EVERY MORNING, AND 1 TABLET IN THE EVENING (Patient taking differently: Take 500 mg by mouth 2 (two) times daily.) 270 tablet 0   metoprolol (TOPROL-XL) 200 MG 24 hr tablet TAKE 1 TABLET BY MOUTH EVERY DAY 90 tablet 0   Multiple Vitamins-Minerals (COMPLETE ENERGY) TABS Take 1 tablet by mouth daily.     nitroGLYCERIN (NITROSTAT) 0.4 MG SL tablet Place 1 tablet (0.4 mg total) under the tongue every 5 (five) minutes as needed for chest pain. 30 tablet 4   Omega-3 Fatty Acids (FISH OIL) 1000 MG CAPS Take 1 capsule by mouth daily.     omeprazole (PRILOSEC) 20 MG capsule Take 1 capsule (20 mg total) by mouth daily. 90 capsule 1   ONETOUCH VERIO test strip 1 each by Other route daily.     PROLENSA 0.07 % SOLN SMARTSIG:1 Drop(s) Right Eye Every Evening     traMADol (ULTRAM) 50 MG tablet Take 50 mg by mouth 2 (two) times daily as needed.     triamcinolone (KENALOG) 0.025 % ointment Apply 1 application topically 2 (two) times daily as needed.     No facility-administered medications prior to visit.    Allergies  Allergen Reactions   Dairy Aid [Lactase]     Hives, diarrhea   Lactose  Anaphylaxis and Other (See Comments)   Atorvastatin Other (See Comments)    Severe myalgias   Compazine [Prochlorperazine Edisylate]    Hydrocodone-Acetaminophen  REACTION: sore throat with vicodin   Hydrocodone-Acetaminophen Other (See Comments)    Other reaction(s): Other (See Comments) Sore throat , like it is closing up on her REACTION: sore throat with vicodin    Milk-Related Compounds Other (See Comments)   Myrbetriq [Mirabegron]     Headache    Other Other (See Comments)    Other reaction(s): headache Other reaction(s): confused   Oxycontin [Oxycodone Hcl]     "makes me crazy"   Prochlorperazine Other (See Comments)   Prochlorperazine Edisylate     REACTION: stroke-like symptoms with companzine   Rosuvastatin     Can take brand crestor-severe myalgias   Shellfish Allergy    Shellfish-Derived Products Other (See Comments)   Umeclidinium-Vilanterol Other (See Comments)    Lactose allergy   Percodan [Oxycodone-Aspirin] Palpitations    Fast heartbeat with percodan    Review of Systems  Constitutional:  Negative for chills and fever.  Neurological:  Positive for light-headedness. Negative for weakness and headaches.  Psychiatric/Behavioral:  Negative for dysphoric mood. The patient is not nervous/anxious.       Objective:    Physical Exam Vitals reviewed.  Constitutional:      Appearance: Normal appearance.  HENT:     Head: Normocephalic.     Right Ear: Tympanic membrane normal.     Left Ear: Tympanic membrane normal.  Cardiovascular:     Rate and Rhythm: Normal rate and regular rhythm.     Heart sounds: Normal heart sounds.  Pulmonary:     Effort: Pulmonary effort is normal.     Breath sounds: Normal breath sounds.  Musculoskeletal:        General: Tenderness (just superior to left medial eye brow. mild edema. no nodule. tender over lateral epicondyle (Rt>lt)) present.  Neurological:     Mental Status: She is alert.    BP 136/78   Pulse 96   Temp  (!) 97.5 F (36.4 C)   Resp 16   Wt 145 lb 3.2 oz (65.9 kg)   BMI 28.84 kg/m  Wt Readings from Last 3 Encounters:  03/18/21 145 lb 3.2 oz (65.9 kg)  02/26/21 146 lb (66.2 kg)  12/25/20 148 lb (67.1 kg)    Health Maintenance Due  Topic Date Due   OPHTHALMOLOGY EXAM  Never done   Hepatitis C Screening  Never done   Zoster Vaccines- Shingrix (1 of 2) Never done   COLONOSCOPY (Pts 45-16yr Insurance coverage will need to be confirmed)  Never done   COVID-19 Vaccine (4 - Booster for Moderna series) 07/18/2020   INFLUENZA VACCINE  01/28/2021    There are no preventive care reminders to display for this patient.   Lab Results  Component Value Date   TSH 1.450 09/20/2020   Lab Results  Component Value Date   WBC 5.4 12/25/2020   HGB 13.2 12/25/2020   HCT 40.8 12/25/2020   MCV 94 12/25/2020   PLT 265 12/25/2020   Lab Results  Component Value Date   NA 146 (H) 12/25/2020   K 3.8 12/25/2020   CO2 26 12/25/2020   GLUCOSE 115 (H) 12/25/2020   BUN 10 12/25/2020   CREATININE 0.81 12/25/2020   BILITOT 0.4 12/25/2020   ALKPHOS 47 12/25/2020   AST 15 12/25/2020   ALT 14 12/25/2020   PROT 6.9 12/25/2020   ALBUMIN 4.5 12/25/2020   CALCIUM 9.9 12/25/2020   EGFR 76 12/25/2020   Lab Results  Component Value Date   CHOL 162 12/25/2020   Lab  Results  Component Value Date   HDL 53 12/25/2020   Lab Results  Component Value Date   LDLCALC 86 12/25/2020   Lab Results  Component Value Date   TRIG 129 12/25/2020   Lab Results  Component Value Date   CHOLHDL 3.1 12/25/2020   Lab Results  Component Value Date   HGBA1C 6.8 (H) 12/25/2020       Assessment & Plan:   Problem List Items Addressed This Visit       Musculoskeletal and Integument   Lateral epicondylitis - Primary    May use ice if helps.  Recommend ibuprofen 800 mg one twice a day.  Recommend tennis elbow band.  Recommend call back if not improving in 1-2 weeks and I will work her in for a kenalog  injection.        Other   Contusion of head, initial encounter    Reassurance given.  May use ice if helps.  Recommend ibuprofen.      Light headedness    Push fluids. Rest.         Follow-up: No follow-ups on file.  An After Visit Summary was printed and given to the patient.  [name], MD [facility] [phone]

## 2021-03-18 NOTE — Assessment & Plan Note (Signed)
Reassurance given.  May use ice if helps.  Recommend ibuprofen.

## 2021-03-18 NOTE — Assessment & Plan Note (Signed)
May use ice if helps.  Recommend ibuprofen 800 mg one twice a day.  Recommend tennis elbow band.  Recommend call back if not improving in 1-2 weeks and I will work her in for a kenalog injection.

## 2021-03-18 NOTE — Assessment & Plan Note (Signed)
Push fluids    Rest

## 2021-03-18 NOTE — Patient Instructions (Signed)
Ibuprofen 800 mg one twice a day. Ice it.  Get a tennis elbow band.

## 2021-03-21 ENCOUNTER — Telehealth: Payer: Self-pay

## 2021-03-21 NOTE — Chronic Care Management (AMB) (Addendum)
Chronic Care Management Pharmacy Assistant   Name: Mindy Ingram  MRN: [id number] DOB: 07-02-1945   Reason for Encounter: Disease State call for HTN   Recent office visits:  03/18/21 Mindy Brome MD. Seen for Head Contussion. Instructed to take Ibuprofen 800 mg twice daily. No follow up if no improvements.  8/30/2 Mindy Brome MD. Seen for BPPV. D/C Nutritional Supplements and Valacyclovir 1000 mg daily prn. Follow up as needed.  12/25/20 Mindy Brome MD. Seen for DM and HLD. Bone density was ordered. Started Ibuprofen 800 mg every 8 hours prn. Stopped the Meloxicam 7.5 mg.  Follow up on 03/27/21 for fasting.  Recent consult visits:  01/08/21 Mindy Ingram-Podiatry. Seen for DM visit. Xray's ordered. D/C Meloxicam 7.5 mg (Patient reports no longer taking). Follow up in 1 month  12/11/20 Mindy Ingram-Podiatry. Initial Consult. No Notes available  Hospital visits:  None in previous 6 months  Medications: Outpatient Encounter Medications as of 03/21/2021  Medication Sig   acyclovir (ZOVIRAX) 200 MG capsule Take 200 mg by mouth 5 (five) times daily.   albuterol (PROVENTIL HFA;VENTOLIN HFA) 108 (90 Base) MCG/ACT inhaler Inhale 2 puffs every 4-6 hours as needed   amLODipine (NORVASC) 5 MG tablet TAKE 1/2 TABLET BY MOUTH DAILY (Patient taking differently: Take 2.5 mg by mouth daily as needed.)   aspirin EC 81 MG tablet Take 81 mg by mouth daily.   azelastine (ASTELIN) 0.1 % nasal spray Place 1 spray into the nose 2 (two) times daily as needed.   BESIVANCE 0.6 % SUSP Place 1 drop into the right eye 3 (three) times daily.   cetirizine (ZYRTEC) 10 MG tablet Take 1 tablet (10 mg total) by mouth daily as needed.   CHLOROPHYLL EX Apply 1 tablet topically daily.   Cholecalciferol (VITAMIN D PO) Take 2,000 Units by mouth daily.    CRESTOR 10 MG tablet ONE THREE TIMES A WEEK. (Patient not taking: Reported on 02/26/2021)   Elderberry 575 MG/5ML SYRP Take 5 mLs by mouth daily.  (Patient not taking: Reported on 02/26/2021)   fluticasone (FLONASE) 50 MCG/ACT nasal spray 1-2 puffs each nostril once daily   ibuprofen (ADVIL) 800 MG tablet Take 1 tablet (800 mg total) by mouth every 8 (eight) hours as needed.   Inulin (FIBER CHOICE PO) Take 1 tablet by mouth daily as needed.   Lancets (ONETOUCH DELICA PLUS [id number]) MISC CHECK BLOOD SUGAR TWICE DAILY PRIOR TO MEAL   losartan-hydrochlorothiazide (HYZAAR) 50-12.5 MG tablet Take 1 tablet by mouth daily.   metFORMIN (GLUCOPHAGE-XR) 500 MG 24 hr tablet TAKE 2 TABLETS EVERY MORNING, AND 1 TABLET IN THE EVENING (Patient taking differently: Take 500 mg by mouth 2 (two) times daily.)   metoprolol (TOPROL-XL) 200 MG 24 hr tablet TAKE 1 TABLET BY MOUTH EVERY DAY   Multiple Vitamins-Minerals (COMPLETE ENERGY) TABS Take 1 tablet by mouth daily.   nitroGLYCERIN (NITROSTAT) 0.4 MG SL tablet Place 1 tablet (0.4 mg total) under the tongue every 5 (five) minutes as needed for chest pain.   Omega-3 Fatty Acids (FISH OIL) 1000 MG CAPS Take 1 capsule by mouth daily.   omeprazole (PRILOSEC) 20 MG capsule Take 1 capsule (20 mg total) by mouth daily.   ONETOUCH VERIO test strip 1 each by Other route daily.   PROLENSA 0.07 % SOLN SMARTSIG:1 Drop(s) Right Eye Every Evening   traMADol (ULTRAM) 50 MG tablet Take 50 mg by mouth 2 (two) times daily as needed.   triamcinolone (KENALOG) 0.025 %  ointment Apply 1 application topically 2 (two) times daily as needed.   No facility-administered encounter medications on file as of 03/21/2021.    Recent Office Vitals: BP Readings from Last 3 Encounters:  03/18/21 136/78  02/26/21 (!) 144/80  12/25/20 140/70   Pulse Readings from Last 3 Encounters:  03/18/21 96  02/26/21 78  12/25/20 71    Wt Readings from Last 3 Encounters:  03/18/21 145 lb 3.2 oz (65.9 kg)  02/26/21 146 lb (66.2 kg)  12/25/20 148 lb (67.1 kg)     Kidney Function Lab Results  Component Value Date/Time   CREATININE 0.81  12/25/2020 10:58 AM   CREATININE 0.90 09/20/2020 10:21 AM   GFRNONAA >60 09/14/2008 03:58 PM   GFRAA  09/14/2008 03:58 PM    >60        The eGFR has been calculated using the MDRD equation. This calculation has not been validated in all clinical situations. eGFR's persistently <60 mL/min signify possible Chronic Kidney Disease.    BMP Latest Ref Rng & Units 12/25/2020 09/20/2020 09/14/2008  Glucose 65 - 99 mg/dL 115(H) 109(H) 146(H)  BUN 8 - 27 mg/dL _0 Creatinine 0.57 - 1.00 mg/dL 0.81 0.90 0.92  BUN/Creat Ratio 12 - _1 -  Sodium 134 - 144 mmol/L 146(H) 145(H) 139  Potassium 3.5 - 5.2 mmol/L 3.8 4.2 3.8  Chloride 96 - 106 mmol/L 103 102 105  CO2 20 - 29 mmol/L _2 Calcium 8.7 - 10.3 mg/dL 9.9 10.0 9.3     Current antihypertensive regimen:  Metoprolol 200 mg daily Losartan-HCTZ 50-12.5 mg daily Amlodipine 5 mg 1/2 tablet PRN   Patient verbally confirms she is taking the above medications as directed.   How often are you checking your Blood Pressure? daily  she checks her blood pressure in the morning before taking her medication.  Current home BP readings:   DATE:             BP               PULSE       03/21/21            154/74  61 143/72  62             129/71  88 131/72     68           Wrist or arm cuff: Wrist Cuff Caffeine intake:Pt has coffee in the mornings and decaf through out the day  Any readings above 180/120? Pt stated never goes this high  What recent interventions/DTPs have been made by any provider to improve Blood Pressure control since last CPP Visit:   Any recent hospitalizations or ED visits since last visit with CPP? No recent ED visits  What diet changes have been made to improve Blood Pressure Control?  Pt states no changes  What exercise is being done to improve your Blood Pressure Control?  Pt stated she has attempted to start her walking back but is suffering from arthritis.  Adherence Review: Is the patient  currently on ACE/ARB medication? Yes Does the patient have >5 day gap between last estimated fill dates? No pt stated she had plenty on hand  Care Gaps: Last annual wellness visit?None noted  Star Rating Drugs:  Medication:  Last Fill: Day Supply Metformin   02/19/21 90ds Losartan-HCTZ 12/26/20 90ds Rosuvastatin   12/13/20 [place]

## 2021-03-26 ENCOUNTER — Other Ambulatory Visit: Payer: Self-pay | Admitting: Family Medicine

## 2021-03-26 NOTE — Telephone Encounter (Signed)
BP Elevated, seeing PCP in 2 weeks, will let them know  DATE:              BP               PULSE       03/21/21                 154/74              61 143/72             62             129/71             88 131/72             68

## 2021-03-26 NOTE — Telephone Encounter (Signed)
Keep appt in October.

## 2021-03-26 NOTE — Telephone Encounter (Signed)
Refill sent to pharmacy.   

## 2021-04-08 ENCOUNTER — Encounter: Payer: Self-pay | Admitting: Family Medicine

## 2021-04-08 ENCOUNTER — Other Ambulatory Visit: Payer: Self-pay

## 2021-04-08 ENCOUNTER — Ambulatory Visit (INDEPENDENT_AMBULATORY_CARE_PROVIDER_SITE_OTHER): Payer: Medicare Other | Admitting: Family Medicine

## 2021-04-08 VITALS — BP 142/78 | HR 68 | Temp 97.2°F | Ht 60.0 in | Wt 143.0 lb

## 2021-04-08 DIAGNOSIS — E782 Mixed hyperlipidemia: Secondary | ICD-10-CM | POA: Diagnosis not present

## 2021-04-08 DIAGNOSIS — M7711 Lateral epicondylitis, right elbow: Secondary | ICD-10-CM

## 2021-04-08 DIAGNOSIS — G4733 Obstructive sleep apnea (adult) (pediatric): Secondary | ICD-10-CM

## 2021-04-08 DIAGNOSIS — I10 Essential (primary) hypertension: Secondary | ICD-10-CM

## 2021-04-08 DIAGNOSIS — E1169 Type 2 diabetes mellitus with other specified complication: Secondary | ICD-10-CM

## 2021-04-08 DIAGNOSIS — Z23 Encounter for immunization: Secondary | ICD-10-CM | POA: Diagnosis not present

## 2021-04-08 HISTORY — DX: Type 2 diabetes mellitus with other specified complication: E11.69

## 2021-04-08 MED ORDER — OZEMPIC (0.25 OR 0.5 MG/DOSE) 2 MG/1.5ML ~~LOC~~ SOPN: 0.5000 mg | PEN_INJECTOR | SUBCUTANEOUS | 0 refills | Status: DC

## 2021-04-08 NOTE — Patient Instructions (Addendum)
Ibuprofen 800 mg one three times a day x 2 weeks OR voltaren gel four times a day x 2 weeks. May take tylenol otc as directions.   Diabetes:  Stop metformin.  Start on ozempic 0.25 mg once weekly x 4 weeks, then increase to 0.5 mg weekly.

## 2021-04-08 NOTE — Progress Notes (Signed)
Subjective:  Patient ID: Mindy Ingram, female    DOB: Oct 26, 1945  Age: 75 y.o. MRN: [id number]  Chief Complaint  Patient presents with   Diabetes   Hypertension   Hyperlipidemia    Diabetes Pertinent negatives for hypoglycemia include no dizziness, headaches or nervousness/anxiousness. Pertinent negatives for diabetes include no chest pain, no fatigue and no weakness.  Hypertension Pertinent negatives include no chest pain, headaches, palpitations or shortness of breath.  Hyperlipidemia Pertinent negatives include no chest pain, myalgias or shortness of breath.     Diabetes:  Complications: None Glucose checking:Checks sugars daily Glucose logs: Ranges 80-200  Most recent A1C: 12/25/2020 results- 6.8 Current medications: Metformin 500mg  2 tablets every morning and 1 tablet in the evening Last Eye Exam: 03/16/2019 Foot checks: Yes daily  Hyperlipidemia: Current medications: Rosuvastatin 10mg  one twice a week. Decreased from 10 mg one three times a week.  Hypertension: Complications: Current medications: Losartan/HCTZ 50/12.5mg  once daily, Metoprolol 200mg  1 tablet daily. Amlodipine 5mg  1/2 tablet daily if sbp is > 130. Checks bp three times a day.   Diet: Healthy Exercise: No. Due to arm hurting.   Complaining of rt tennis elbow pain. Using tennis elbow brace helps. Prior to her neck surgery she had radiculopathy, but it got better after surgery.  In the last week it has been flaring up. Throbbing aching pain down arm. Pt has pain from her rt shoulder down to her hand.   Current Outpatient Medications on File Prior to Visit  Medication Sig Dispense Refill   albuterol (PROVENTIL HFA;VENTOLIN HFA) 108 (90 Base) MCG/ACT inhaler Inhale 2 puffs every 4-6 hours as needed 18 g 12   amLODipine (NORVASC) 5 MG tablet TAKE 1/2 TABLET BY MOUTH DAILY (Patient taking differently: Take 2.5 mg by mouth daily as needed.) 45 tablet 1   aspirin EC 81 MG tablet Take 81 mg by mouth daily.      BESIVANCE 0.6 % SUSP Place 1 drop into the right eye 3 (three) times daily.     cetirizine (ZYRTEC) 10 MG tablet Take 1 tablet (10 mg total) by mouth daily as needed. 90 tablet 3   CHLOROPHYLL EX Apply 1 tablet topically daily.     Cholecalciferol (VITAMIN D PO) Take 2,000 Units by mouth daily.      CRESTOR 10 MG tablet TAKE 1 TABLET BY MOUTH 3 TIMES A WEEK 36 tablet 0   Difluprednate 0.05 % EMUL Place 1 drop into the right eye 4 (four) times daily.     Elderberry 575 MG/5ML SYRP Take 5 mLs by mouth daily.     fluticasone (FLONASE) 50 MCG/ACT nasal spray 1-2 puffs each nostril once daily 16 g 12   ibuprofen (ADVIL) 800 MG tablet Take 1 tablet (800 mg total) by mouth every 8 (eight) hours as needed. 90 tablet 2   Inulin (FIBER CHOICE PO) Take 1 tablet by mouth daily as needed.     Lancets (ONETOUCH DELICA PLUS [id number]) MISC CHECK BLOOD SUGAR TWICE DAILY PRIOR TO MEAL     losartan-hydrochlorothiazide (HYZAAR) 50-12.5 MG tablet Take 1 tablet by mouth daily. 90 tablet 1   metFORMIN (GLUCOPHAGE-XR) 500 MG 24 hr tablet TAKE 2 TABLETS EVERY MORNING, AND 1 TABLET IN THE EVENING (Patient taking differently: Take 500 mg by mouth 2 (two) times daily.) 270 tablet 0   metoprolol (TOPROL-XL) 200 MG 24 hr tablet TAKE 1 TABLET BY MOUTH EVERY DAY 90 tablet 0   Multiple Vitamins-Minerals (COMPLETE ENERGY) TABS Take 1 tablet by  mouth daily.     nitroGLYCERIN (NITROSTAT) 0.4 MG SL tablet Place 1 tablet (0.4 mg total) under the tongue every 5 (five) minutes as needed for chest pain. 30 tablet 4   Omega-3 Fatty Acids (FISH OIL) 1000 MG CAPS Take 1 capsule by mouth daily.     omeprazole (PRILOSEC) 20 MG capsule Take 1 capsule (20 mg total) by mouth daily. 90 capsule 1   ONETOUCH VERIO test strip 1 each by Other route daily.     PROLENSA 0.07 % SOLN SMARTSIG:1 Drop(s) Right Eye Every Evening     traMADol (ULTRAM) 50 MG tablet Take 50 mg by mouth 2 (two) times daily as needed.     triamcinolone (KENALOG) 0.025 %  ointment Apply 1 application topically 2 (two) times daily as needed.     triamcinolone cream (KENALOG) 0.1 % 1 application     valACYclovir (VALTREX) 500 MG tablet 1 tablet     azelastine (ASTELIN) 0.1 % nasal spray Place 1 spray into the nose 2 (two) times daily as needed.     No current facility-administered medications on file prior to visit.   Past Medical History:  Diagnosis Date   Allergic rhinitis    Asthma    Bronchitis    none in years   DJD (degenerative joint disease)    Fibromyalgia    GERD (gastroesophageal reflux disease)    Hypertension    OSA (obstructive sleep apnea)    cpap setting of 10   Otitis media    both ears with impacted cerumem   Rapid heartbeat    no metoprolol lin pm   Sleep apnea    Type II or unspecified type diabetes mellitus without mention of complication, not stated as uncontrolled    Past Surgical History:  Procedure Laterality Date   [place]  2013   lower back with plates and screws   benign lump right axilla     BILATERAL SALPINGOOPHORECTOMY  [place]   at [facility], slight limitation with turning neck   CHOLECYSTECTOMY  1995   EUS N/A 07/21/2013   Procedure: UPPER ENDOSCOPIC ULTRASOUND (EUS) LINEAR;  Surgeon: Milus Banister, MD;  Location: [facility];  Service: Endoscopy;  Laterality: N/A;   FOOT SURGERY     2007 - right great toe, 2000 - right fifth digit .   NEUROPLASTY / TRANSPOSITION MEDIAN NERVE AT [place]   PARTIAL COLECTOMY  1995   benign adhesions   PARTIAL HYSTERECTOMY  1980s.   abdominal   right foot surgery  2000   right great toe with artificial bone inserted   right knee arthroscopy Right 02/2017    Family History  Problem Relation Age of Onset   Breast cancer Maternal Aunt    Allergies Neg Hx    Asthma Neg Hx    Eczema Neg Hx    Immunodeficiency Neg Hx    Social History   Socioeconomic History   Marital status: Widowed    Spouse  name: Not on file   Number of children: 1   Years of education: Not on file   Highest education level: Not on file  Occupational History   Not on file  Tobacco Use   Smoking status: Never   Smokeless tobacco: Never  Substance and Sexual Activity   Alcohol use: No   Drug use: No   Sexual activity: Not on file  Other Topics Concern  Not on file  Social History Narrative   Not on file   Social Determinants of Health   Financial Resource Strain: Not on file  Food Insecurity: No Food Insecurity   Worried About Running Out of Food in the Last Year: Never true   Ran Out of Food in the Last Year: Never true  Transportation Needs: No Transportation Needs   Lack of Transportation (Medical): No   Lack of Transportation (Non-Medical): No  Physical Activity: Not on file  Stress: Not on file  Social Connections: Not on file    Review of Systems  Constitutional:  Negative for appetite change, fatigue and fever.  HENT:  Negative for congestion, ear pain, sinus pressure and sore throat.   Eyes:  Negative for pain.  Respiratory:  Negative for cough, chest tightness, shortness of breath and wheezing.   Cardiovascular:  Negative for chest pain and palpitations.  Gastrointestinal:  Negative for abdominal pain, constipation, diarrhea, nausea and vomiting.  Genitourinary:  Negative for dysuria and hematuria.  Musculoskeletal:  Positive for arthralgias (RT SHOULDER PAIN). Negative for back pain, joint swelling and myalgias.  Skin:  Negative for rash.  Neurological:  Negative for dizziness, weakness and headaches.  Psychiatric/Behavioral:  Negative for dysphoric mood. The patient is not nervous/anxious.     Objective:  BP (!) 142/78 (BP Location: Right Arm, Patient Position: Sitting)   Pulse 68   Temp (!) 97.2 F (36.2 C) (Temporal)   Ht 5' (1.524 m)   Wt 143 lb (64.9 kg)   SpO2 97%   BMI 27.93 kg/m   BP/Weight 04/08/2021 03/18/2021 6/81/2751  Systolic BP 700 174 944  Diastolic BP  78 78 80  Wt. (Lbs) 143 145.2 146  BMI 27.93 28.84 28.99    Physical Exam Vitals reviewed.  Constitutional:      Appearance: Normal appearance. She is obese.  Neck:     Vascular: No carotid bruit.  Cardiovascular:     Rate and Rhythm: Normal rate and regular rhythm.     Pulses: Normal pulses.     Heart sounds: Normal heart sounds.  Pulmonary:     Effort: Pulmonary effort is normal. No respiratory distress.     Breath sounds: Normal breath sounds.  Abdominal:     General: Abdomen is flat. Bowel sounds are normal.     Palpations: Abdomen is soft.     Tenderness: There is no abdominal tenderness.  Musculoskeletal:        General: Tenderness (rt lateral epicondyle. tender superior shoulder. nontender over neck.) present.  Neurological:     Mental Status: She is alert and oriented to person, place, and time.  Psychiatric:        Mood and Affect: Mood normal.        Behavior: Behavior normal.    Diabetic Foot Exam - Simple   Simple Foot Form Diabetic Foot exam was performed with the following findings: Yes 04/08/2021 11:33 AM  Visual Inspection See comments: Yes Sensation Testing Intact to touch and monofilament testing bilaterally: Yes Pulse Check Posterior Tibialis and Dorsalis pulse intact bilaterally: Yes Comments Rt middle hammer toe, callus on top.      Lab Results  Component Value Date   WBC 4.2 04/08/2021   HGB 13.5 04/08/2021   HCT 41.3 04/08/2021   PLT 296 04/08/2021   GLUCOSE 124 (H) 04/08/2021   CHOL 162 04/08/2021   TRIG 101 04/08/2021   HDL 54 04/08/2021   LDLCALC 90 04/08/2021   ALT 12 04/08/2021  AST 17 04/08/2021   NA 144 04/08/2021   K 3.6 04/08/2021   CL 101 04/08/2021   CREATININE 0.81 04/08/2021   BUN 11 04/08/2021   CO2 27 04/08/2021   TSH 1.450 09/20/2020   HGBA1C 6.8 (H) 04/08/2021   MICROALBUR 10 12/25/2020      Assessment & Plan:   Problem List Items Addressed This Visit       Cardiovascular and Mediastinum   Essential  hypertension    Well controlled bp at home. Losartan/HCTZ 50/12.5mg  once daily, Metoprolol 200mg  1 tablet daily. Amlodipine 5mg  1/2 tablet daily if sbp is > 130.      Relevant Orders   CBC with Differential/Platelet (Completed)   Comprehensive metabolic panel (Completed)     Respiratory   Obstructive sleep apnea syndrome    Is compliant with CPAP use. Pt followed by Dr. Annamaria Boots [facility].         Endocrine   DM type 2 with diabetic mixed hyperlipidemia ([place]) - Primary    Control: good Recommend check sugars fasting daily. Recommend check feet daily. Recommend annual eye exams. Medicines: Stop metformin. Start on ozempic 0.25 mg once weekly x 4 weeks, then increase to 0.5 mg weekly.  Continue to work on eating a healthy diet and exercise.          Relevant Medications   Semaglutide,0.25 or 0.5MG /DOS, (OZEMPIC, 0.25 OR 0.5 MG/DOSE,) 2 MG/1.5ML SOPN   Other Relevant Orders   Hemoglobin A1c (Completed)     Musculoskeletal and Integument   Lateral epicondylitis    Ibuprofen 800 mg one three times a day x 2 weeks OR voltaren gel four times a day x 2 weeks. May take tylenol otc as directions.  Nurse gave pt flu shot today prior to me seeing the patient, so a kenalog shot was contraindicated.        Other   Hyperlipidemia    Fairly well controlled.  Continue crestor 10 mg one three times a week.  Low fat diet and exercise recommended.       Relevant Orders   Lipid panel (Completed)   Encounter for immunization   Relevant Orders   Moderna Covid-19 Vaccine Bivalent Booster (Completed)   Other Visit Diagnoses     Need for immunization against influenza       Relevant Orders   Flu Vaccine QUAD High Dose(Fluad) (Completed)     .  Meds ordered this encounter  Medications   Semaglutide,0.25 or 0.5MG /DOS, (OZEMPIC, 0.25 OR 0.5 MG/DOSE,) 2 MG/1.5ML SOPN    Sig: Inject 0.5 mg into the skin once a week.    Dispense:  4.5 mL    Refill:  0    Orders Placed This  Encounter  Procedures   Moderna Covid-19 Vaccine Bivalent Booster   Flu Vaccine QUAD High Dose(Fluad)   CBC with Differential/Platelet   Comprehensive metabolic panel   Hemoglobin A1c   Lipid panel     Follow-up: Return in about 2 weeks (around 04/22/2021) for acute visit for arm/neck., awv with Shelle Iron, LPN after 62/83/1517.Marland Kitchen  An After Visit Summary was printed and given to the patient.      I,Lauren M Auman,acting as a scribe for [name], MD.,have documented all relevant documentation on the behalf of [name], MD,as directed by  [name], MD while in the presence of [name], MD.      [name], MD [place] [phone]

## 2021-04-09 LAB — LIPID PANEL
Chol/HDL Ratio: 3 ratio (ref 0.0–4.4)
Cholesterol, Total: 162 mg/dL (ref 100–199)
HDL: 54 mg/dL (ref >39)
LDL Chol Calc (NIH): 90 mg/dL (ref 0–99)
Triglycerides: 101 mg/dL (ref 0–149)
VLDL Cholesterol Cal: 18 mg/dL (ref 5–40)

## 2021-04-09 LAB — COMPREHENSIVE METABOLIC PANEL
ALT: 12 IU/L (ref 0–32)
AST: 17 IU/L (ref 0–40)
Albumin/Globulin Ratio: 2.2 (ref 1.2–2.2)
Albumin: 4.6 g/dL (ref 3.7–4.7)
Alkaline Phosphatase: 47 IU/L (ref 44–121)
BUN/Creatinine Ratio: 14 (ref 12–28)
BUN: 11 mg/dL (ref 8–27)
Bilirubin Total: 0.3 mg/dL (ref 0.0–1.2)
CO2: 27 mmol/L (ref 20–29)
Calcium: 9.5 mg/dL (ref 8.7–10.3)
Chloride: 101 mmol/L (ref 96–106)
Creatinine, Ser: 0.81 mg/dL (ref 0.57–1.00)
Globulin, Total: 2.1 g/dL (ref 1.5–4.5)
Glucose: 124 mg/dL — ABNORMAL HIGH (ref 70–99)
Potassium: 3.6 mmol/L (ref 3.5–5.2)
Sodium: 144 mmol/L (ref 134–144)
Total Protein: 6.7 g/dL (ref 6.0–8.5)
eGFR: 76 mL/min/{1.73_m2} (ref >59)

## 2021-04-09 LAB — CBC WITH DIFFERENTIAL/PLATELET
Basophils Absolute: 0 10*3/uL (ref 0.0–0.2)
Basos: 0 %
EOS (ABSOLUTE): 0.1 10*3/uL (ref 0.0–0.4)
Eos: 2 %
Hematocrit: 41.3 % (ref 34.0–46.6)
Hemoglobin: 13.5 g/dL (ref 11.1–15.9)
Immature Grans (Abs): 0 10*3/uL (ref 0.0–0.1)
Immature Granulocytes: 0 %
Lymphocytes Absolute: 1.7 10*3/uL (ref 0.7–3.1)
Lymphs: 41 %
MCH: 30.6 pg (ref 26.6–33.0)
MCHC: 32.7 g/dL (ref 31.5–35.7)
MCV: 94 fL (ref 79–97)
Monocytes Absolute: 0.4 10*3/uL (ref 0.1–0.9)
Monocytes: 9 %
Neutrophils Absolute: 2 10*3/uL (ref 1.4–7.0)
Neutrophils: 48 %
Platelets: 296 10*3/uL (ref 150–450)
RBC: 4.41 x10E6/uL (ref 3.77–5.28)
RDW: 12.3 % (ref 11.7–15.4)
WBC: 4.2 10*3/uL (ref 3.4–10.8)

## 2021-04-09 LAB — HEMOGLOBIN A1C
Est. average glucose Bld gHb Est-mCnc: 148 mg/dL
Hgb A1c MFr Bld: 6.8 % — ABNORMAL HIGH (ref 4.8–5.6)

## 2021-04-09 NOTE — Assessment & Plan Note (Addendum)
Is compliant with CPAP use. Pt followed by Dr. Annamaria [facility]s [facility].

## 2021-04-09 NOTE — Assessment & Plan Note (Addendum)
Control: good Recommend check sugars fasting daily. Recommend check feet daily. Recommend annual eye exams. Medicines: Stop metformin. Start on ozempic 0.25 mg once weekly x 4 weeks, then increase to 0.5 mg weekly.  Continue to work on eating a healthy diet and exercise.

## 2021-04-09 NOTE — Assessment & Plan Note (Signed)
Fairly well controlled.  Continue crestor 10 mg one three times a week.  Low fat diet and exercise recommended.

## 2021-04-09 NOTE — Assessment & Plan Note (Signed)
Ibuprofen 800 mg one three times a day x 2 weeks OR voltaren gel four times a day x 2 weeks. May take tylenol otc as directions.  Nurse gave pt flu shot today prior to me seeing the patient, so a kenalog shot was contraindicated.

## 2021-04-09 NOTE — Assessment & Plan Note (Signed)
Well controlled bp at home. Losartan/HCTZ 50/12.5mg  once daily, Metoprolol 200mg  1 tablet daily. Amlodipine 5mg  1/2 tablet daily if sbp is > 130.

## 2021-04-10 DIAGNOSIS — M797 Fibromyalgia: Secondary | ICD-10-CM | POA: Insufficient documentation

## 2021-04-23 ENCOUNTER — Encounter: Payer: Self-pay | Admitting: Family Medicine

## 2021-04-23 ENCOUNTER — Other Ambulatory Visit: Payer: Self-pay

## 2021-04-23 ENCOUNTER — Ambulatory Visit (INDEPENDENT_AMBULATORY_CARE_PROVIDER_SITE_OTHER): Payer: Medicare Other | Admitting: Family Medicine

## 2021-04-23 VITALS — BP 118/62 | HR 86 | Temp 97.2°F | Ht 60.0 in | Wt 142.0 lb

## 2021-04-23 DIAGNOSIS — M7711 Lateral epicondylitis, right elbow: Secondary | ICD-10-CM

## 2021-04-23 DIAGNOSIS — E782 Mixed hyperlipidemia: Secondary | ICD-10-CM | POA: Diagnosis not present

## 2021-04-23 DIAGNOSIS — M62838 Other muscle spasm: Secondary | ICD-10-CM | POA: Diagnosis not present

## 2021-04-23 DIAGNOSIS — H6123 Impacted cerumen, bilateral: Secondary | ICD-10-CM | POA: Diagnosis not present

## 2021-04-23 MED ORDER — LOSARTAN POTASSIUM-HCTZ 100-25 MG PO TABS: 1.0000 | ORAL_TABLET | Freq: Every day | ORAL | 1 refills | Status: DC

## 2021-04-23 NOTE — Progress Notes (Addendum)
Acute Office Visit  Subjective:    Patient ID: Mindy Ingram, female    DOB: 28-Jul-1945, 75 y.o.   MRN: [id number]  Chief Complaint  Patient presents with   Right arm pain    Right ear/neck pain    HPI Patient is in today for rt arm and rt neck pain.  Right Ear pain  Diabetes with mixed hyperlipidemia: Pt did not stop metformin or start ozempic. Pt has worked on her diet and supplements and diarrhea has improved.  Sugars are low 80s.   Vertigo: resolved after 6-8 weeks, but came back yesterday. Restarted exercise.   Past Medical History:  Diagnosis Date   Allergic rhinitis    Asthma    Bronchitis    none in years   DJD (degenerative joint disease)    Fibromyalgia    GERD (gastroesophageal reflux disease)    Hypertension    OSA (obstructive sleep apnea)    cpap setting of 10   Otitis media    both ears with impacted cerumem   Rapid heartbeat    no metoprolol lin pm   Skin lesion 09/18/2016   Sleep apnea    Type II or unspecified type diabetes mellitus without mention of complication, not stated as uncontrolled     Past Surgical History:  Procedure Laterality Date   [place]  2013   lower back with plates and screws   benign lump right axilla     BILATERAL SALPINGOOPHORECTOMY  [place]   at [facility], slight limitation with turning neck   CHOLECYSTECTOMY  1995   EUS N/A 07/21/2013   Procedure: UPPER ENDOSCOPIC ULTRASOUND (EUS) LINEAR;  Surgeon: Milus Banister, MD;  Location: [facility];  Service: Endoscopy;  Laterality: N/A;   FOOT SURGERY     2007 - right great toe, 2000 - right fifth digit .   NEUROPLASTY / TRANSPOSITION MEDIAN NERVE AT [place]   PARTIAL COLECTOMY  1995   benign adhesions   PARTIAL HYSTERECTOMY  1980s.   abdominal   right foot surgery  2000   right great toe with artificial bone inserted   right knee arthroscopy Right 02/2017    Family History   Problem Relation Age of Onset   Breast cancer Maternal Aunt    Allergies Neg Hx    Asthma Neg Hx    Eczema Neg Hx    Immunodeficiency Neg Hx     Social History   Socioeconomic History   Marital status: Widowed    Spouse name: Not on file   Number of children: 1   Years of education: Not on file   Highest education level: Not on file  Occupational History   Not on file  Tobacco Use   Smoking status: Never   Smokeless tobacco: Never  Substance and Sexual Activity   Alcohol use: No   Drug use: No   Sexual activity: Not on file  Other Topics Concern   Not on file  Social History Narrative   Not on file   Social Determinants of Health   Financial Resource Strain: Not on file  Food Insecurity: No Food Insecurity   Worried About Running Out of Food in the Last Year: Never true   Ran Out of Food in the Last Year: Never true  Transportation Needs: No Transportation Needs   Lack of Transportation (Medical): No   Lack of Transportation (Non-Medical): No  Physical Activity: Not on file  Stress: Not on file  Social Connections: Not on file  Intimate Partner Violence: Not on file    Outpatient Medications Prior to Visit  Medication Sig Dispense Refill   albuterol (PROVENTIL HFA;VENTOLIN HFA) 108 (90 Base) MCG/ACT inhaler Inhale 2 puffs every 4-6 hours as needed 18 g 12   amLODipine (NORVASC) 5 MG tablet TAKE 1/2 TABLET BY MOUTH DAILY (Patient taking differently: Take 2.5 mg by mouth daily as needed.) 45 tablet 1   aspirin EC 81 MG tablet Take 81 mg by mouth daily.     BESIVANCE 0.6 % SUSP Place 1 drop into the right eye 3 (three) times daily.     cetirizine (ZYRTEC) 10 MG tablet Take 1 tablet (10 mg total) by mouth daily as needed. 90 tablet 3   CHLOROPHYLL EX Apply 1 tablet topically daily.     Cholecalciferol (VITAMIN D PO) Take 2,000 Units by mouth daily.      CRESTOR 10 MG tablet TAKE 1 TABLET BY MOUTH 3 TIMES A WEEK 36 tablet 0   Difluprednate 0.05 % EMUL Place 1 drop  into the right eye 4 (four) times daily.     Elderberry 575 MG/5ML SYRP Take 5 mLs by mouth daily.     fluticasone (FLONASE) 50 MCG/ACT nasal spray 1-2 puffs each nostril once daily 16 g 12   ibuprofen (ADVIL) 800 MG tablet Take 1 tablet (800 mg total) by mouth every 8 (eight) hours as needed. 90 tablet 2   Inulin (FIBER CHOICE PO) Take 1 tablet by mouth daily as needed.     Lancets (ONETOUCH DELICA PLUS [id number]) MISC CHECK BLOOD SUGAR TWICE DAILY PRIOR TO MEAL     metFORMIN (GLUCOPHAGE-XR) 500 MG 24 hr tablet TAKE 2 TABLETS EVERY MORNING, AND 1 TABLET IN THE EVENING (Patient taking differently: Take 500 mg by mouth 2 (two) times daily.) 270 tablet 0   metoprolol (TOPROL-XL) 200 MG 24 hr tablet TAKE 1 TABLET BY MOUTH EVERY DAY 90 tablet 0   Multiple Vitamins-Minerals (COMPLETE ENERGY) TABS Take 1 tablet by mouth daily.     nitroGLYCERIN (NITROSTAT) 0.4 MG SL tablet Place 1 tablet (0.4 mg total) under the tongue every 5 (five) minutes as needed for chest pain. 30 tablet 4   Omega-3 Fatty Acids (FISH OIL) 1000 MG CAPS Take 1 capsule by mouth daily.     omeprazole (PRILOSEC) 20 MG capsule Take 1 capsule (20 mg total) by mouth daily. 90 capsule 1   ONETOUCH VERIO test strip 1 each by Other route daily.     PROLENSA 0.07 % SOLN SMARTSIG:1 Drop(s) Right Eye Every Evening     Semaglutide,0.25 or 0.5MG/DOS, (OZEMPIC, 0.25 OR 0.5 MG/DOSE,) 2 MG/1.5ML SOPN Inject 0.5 mg into the skin once a week. 4.5 mL 0   traMADol (ULTRAM) 50 MG tablet Take 50 mg by mouth 2 (two) times daily as needed.     triamcinolone (KENALOG) 0.025 % ointment Apply 1 application topically 2 (two) times daily as needed.     triamcinolone cream (KENALOG) 0.1 % 1 application     valACYclovir (VALTREX) 500 MG tablet 1 tablet     losartan-hydrochlorothiazide (HYZAAR) 50-12.5 MG tablet Take 1 tablet by mouth daily. 90 tablet 1   azelastine (ASTELIN) 0.1 % nasal spray Place 1 spray into the nose 2 (two) times daily as needed.     No  facility-administered medications prior to visit.    Allergies  Allergen Reactions   Dairy Aid [  Tilactase]     Hives, diarrhea   Lactose Anaphylaxis and Other (See Comments)   Atorvastatin Other (See Comments)    Severe myalgias   Compazine [Prochlorperazine Edisylate]    Hydrocodone-Acetaminophen     REACTION: sore throat with vicodin   Hydrocodone-Acetaminophen Other (See Comments)    Other reaction(s): Other (See Comments) Sore throat , like it is closing up on her REACTION: sore throat with vicodin    Milk-Related Compounds Other (See Comments)   Mirabegron Other (See Comments)    Headache    Other Other (See Comments)    Other reaction(s): headache Other reaction(s): confused   Oxycontin [Oxycodone Hcl]     "makes me crazy"   Prochlorperazine Other (See Comments)   Prochlorperazine Edisylate     REACTION: stroke-like symptoms with companzine   Rosuvastatin     Can take brand crestor-severe myalgias   Shellfish Allergy    Shellfish-Derived Products Other (See Comments)   Umeclidinium-Vilanterol Other (See Comments)    Lactose allergy   Percodan [Oxycodone-Aspirin] Palpitations    Fast heartbeat with percodan    Review of Systems  Constitutional:  Negative for appetite change, fatigue and fever.  HENT:  Positive for ear pain (Right). Negative for congestion, sinus pressure and sore throat.   Eyes:  Negative for pain.  Respiratory:  Negative for cough, chest tightness, shortness of breath and wheezing.   Cardiovascular:  Negative for chest pain and palpitations.  Gastrointestinal:  Negative for abdominal pain, constipation, diarrhea, nausea and vomiting.  Genitourinary:  Negative for dysuria and hematuria.  Musculoskeletal:  Positive for myalgias (Right arm below elbow/ neck pain). Negative for arthralgias, back pain and joint swelling.  Skin:  Negative for rash.  Neurological:  Negative for dizziness, weakness and headaches.  Psychiatric/Behavioral:  Negative for  dysphoric mood. The patient is not nervous/anxious.       Objective:    Physical Exam Constitutional:      Appearance: Normal appearance.  HENT:     Right Ear: There is impacted cerumen.     Left Ear: There is impacted cerumen.  Cardiovascular:     Rate and Rhythm: Normal rate and regular rhythm.     Pulses: Normal pulses.     Heart sounds: Normal heart sounds.  Pulmonary:     Effort: Pulmonary effort is normal.     Breath sounds: Normal breath sounds.  Abdominal:     Palpations: Abdomen is soft.  Musculoskeletal:        General: Tenderness (right lateral epicondylitis. Rt trapezius tender. FROM of shoulder good.) present.     Cervical back: Normal range of motion.  Neurological:     Mental Status: She is alert and oriented to person, place, and time.  Psychiatric:        Mood and Affect: Mood normal.        Behavior: Behavior normal.    BP 118/62 (BP Location: Left Arm, Patient Position: Sitting)   Pulse 86   Temp (!) 97.2 F (36.2 C) (Temporal)   Ht 5' (1.524 m)   Wt 142 lb (64.4 kg)   SpO2 98%   BMI 27.73 kg/m  Wt Readings from Last 3 Encounters:  04/23/21 142 lb (64.4 kg)  04/08/21 143 lb (64.9 kg)  03/18/21 145 lb 3.2 oz (65.9 kg)    Health Maintenance Due  Topic Date Due   OPHTHALMOLOGY EXAM  Never done   Hepatitis C Screening  Never done   Zoster Vaccines- Shingrix (1 of 2) Never  done   COLONOSCOPY (Pts 45-79yr Insurance coverage will need to be confirmed)  Never done    There are no preventive care reminders to display for this patient.   Lab Results  Component Value Date   TSH 1.450 09/20/2020   Lab Results  Component Value Date   WBC 4.2 04/08/2021   HGB 13.5 04/08/2021   HCT 41.3 04/08/2021   MCV 94 04/08/2021   PLT 296 04/08/2021   Lab Results  Component Value Date   NA 144 04/08/2021   K 3.6 04/08/2021   CO2 27 04/08/2021   GLUCOSE 124 (H) 04/08/2021   BUN 11 04/08/2021   CREATININE 0.81 04/08/2021   BILITOT 0.3 04/08/2021    ALKPHOS 47 04/08/2021   AST 17 04/08/2021   ALT 12 04/08/2021   PROT 6.7 04/08/2021   ALBUMIN 4.6 04/08/2021   CALCIUM 9.5 04/08/2021   EGFR 76 04/08/2021   Lab Results  Component Value Date   CHOL 162 04/08/2021   Lab Results  Component Value Date   HDL 54 04/08/2021   Lab Results  Component Value Date   LDLCALC 90 04/08/2021   Lab Results  Component Value Date   TRIG 101 04/08/2021   Lab Results  Component Value Date   CHOLHDL 3.0 04/08/2021   Lab Results  Component Value Date   HGBA1C 6.8 (H) 04/08/2021         Assessment & Plan:   Problem List Items Addressed This Visit       Nervous and Auditory   Bilateral impacted cerumen    Irrigation of BL ears.        Musculoskeletal and Integument   Lateral epicondylitis - Primary    Exercises: do each exercise 3 times in a row twice a day.      Neck muscle spasm    Exercises: do each exercise 3 times in a row twice a day.        Other   Hyperlipidemia    Get coenzyme q 10 to take daily.  Continue crestor 1-2 times per week.       Relevant Medications   losartan-hydrochlorothiazide (HYZAAR) 100-25 MG tablet     Meds ordered this encounter  Medications   losartan-hydrochlorothiazide (HYZAAR) 100-25 MG tablet    Sig: Take 1 tablet by mouth daily.    Dispense:  90 tablet    Refill:  1    I,Joniya Boberg M Izreal Kock,acting as a scribe for KRochel Brome MD.,have documented all relevant documentation on the behalf of KRochel Brome MD,as directed by  KRochel Brome MD while in the presence of KRochel Brome MD.   KRochel Brome MD

## 2021-04-23 NOTE — Patient Instructions (Addendum)
Increase losartan 100/25 mg once daily.   Exercises: do each exercise 3 times in a row twice a day.   Get coenzyme q 10 to take daily.  Continue crestor 1-2 times per week.

## 2021-04-26 ENCOUNTER — Encounter: Payer: Self-pay | Admitting: Family Medicine

## 2021-04-26 DIAGNOSIS — M62838 Other muscle spasm: Secondary | ICD-10-CM | POA: Insufficient documentation

## 2021-04-26 HISTORY — DX: Other muscle spasm: M62.838

## 2021-04-26 NOTE — Assessment & Plan Note (Signed)
Exercises: do each exercise 3 times in a row twice a day.

## 2021-04-26 NOTE — Assessment & Plan Note (Signed)
Get coenzyme q 10 to take daily.  Continue crestor 1-2 times per week.

## 2021-04-27 ENCOUNTER — Encounter: Payer: Self-pay | Admitting: Family Medicine

## 2021-04-27 NOTE — Assessment & Plan Note (Signed)
Irrigation of BL ears.

## 2021-04-30 ENCOUNTER — Ambulatory Visit (INDEPENDENT_AMBULATORY_CARE_PROVIDER_SITE_OTHER): Payer: Medicare Other | Admitting: Internal Medicine

## 2021-04-30 ENCOUNTER — Encounter: Payer: Self-pay | Admitting: Internal Medicine

## 2021-04-30 ENCOUNTER — Other Ambulatory Visit: Payer: Self-pay

## 2021-04-30 DIAGNOSIS — J4520 Mild intermittent asthma, uncomplicated: Secondary | ICD-10-CM | POA: Diagnosis not present

## 2021-04-30 DIAGNOSIS — J3089 Other allergic rhinitis: Secondary | ICD-10-CM

## 2021-04-30 DIAGNOSIS — J302 Other seasonal allergic rhinitis: Secondary | ICD-10-CM

## 2021-04-30 DIAGNOSIS — G4733 Obstructive sleep apnea (adult) (pediatric): Secondary | ICD-10-CM

## 2021-04-30 NOTE — Progress Notes (Signed)
Subjective:    Patient ID: Mindy Ingram, female    DOB: [birth year], 75 y.Mindy Ingram.   MRN: [id number]  HPI F never smoker followed for obstructive sleep apnea, allergic rhinitis complicated by DM, bronchitis, GERD. Office Spirometry 04/03/2015- WNL FVC 2.14/110%, FEV1 1.94/128%, FEV1/FVC 0.91 . NPSG 11/12/10- AHI  17.9/ hr, desat to 76% titrated to CPAP 12, body weight 165 lbs Office spirometry- 11/04/16--by allergy office- FEV1/FVC 0.77 WNL -------------------------------------------------------------------------------.   4/22/211- Virtual Visit via Telephone Note  I connected with Mindy Ingram on 10/20/19 at  2:30 PM EDT by telephone and verified that I am speaking with the correct person using two identifiers.  Location: Patient: Mindy Ingram Provider: O   I discussed the limitations, risks, security and privacy concerns of performing an evaluation and management service by telephone and the availability of in person appointments. I also discussed with the patient that there may be a patient responsible charge related to this service. The patient expressed understanding and agreed to proceed.   History of Present Illness: 75 year old female never smoker followed for OSA, complicated by allergic rhinitis, asthmatic bronchitis, DM2, GERD,  CPAP auto 5-17/ [employer] ([place])   Machine replaced 11/2019 Download compliance 97%, AHI 2.1/ hr Proventil hfa, flonase,  Hasn't felt any need for rescue inhaler   Observations/Objective:   Assessment and Plan: OSA- good compliance and control- cntinue auto 5-17 Asthmatic bronchitis- mild intermittent uncomplicated  Follow Up Instructions: 1 year   I discussed the assessment and treatment plan with the patient. The patient was provided an opportunity to ask questions and all were answered. The patient agreed with the plan and demonstrated an understanding of the instructions.   The patient was advised to call back or seek an  in-person evaluation if the symptoms worsen or if the condition fails to improve as anticipated.  I provided 21 minutes of non-face-to-face time during this encounter.   Mindy Lyons, MD  04/30/21- 75 year old female never smoker followed for OSA, complicated by allergic rhinitis, asthmatic bronchitis, DM2, GERD,  CPAP auto 5-17/ ([place])   Machine replaced 11/2019 Download compliance -97%, AHI 2.2/ hr -Proventil hfa, flonase,  Body weight today-145 lbs Covid vax- Flu vax- -----Wearing CPAP-wakes up at night with off sometimes Download reviewed. Doing well with CPAP. Sleeps ok. Never uses rescue inhaler- just Flonase.  Review of Systems- see HPI + = positive Constitutional:   No-   weight loss, night sweats, fevers, chills, fatigue, lassitude. HEENT:   No-  headaches, difficulty swallowing, tooth/dental problems, sore throat,       No-  sneezing, itching, ear ache, +nasal congestion,+post nasal drip,  CV:  No-   chest pain, orthopnea, PND, swelling in lower extremities, anasarca, dizziness, palpitations Resp: No-   shortness of breath with exertion or at rest.              No-   productive cough,  No non-productive cough,  No-  coughing up of blood.              No-   change in color of mucus.  No- wheezing.   Skin:  GI:  No-   heartburn, indigestion, abdominal pain, nausea, vomiting,  GU:  MS:  No-   joint pain or swelling.   Neuro- nothing unusual  Psych:  No- change in mood or affect. No depression or anxiety.  No memory loss.    Objective:   Physical Exam    Mask Covid precaution General- Alert,  Oriented, Affect-appropriate, Distress- none acute, + overweight Skin- +Raised nodules right nasolabial fold without erythema or excoriation Lymphadenopathy- none Head- atraumatic            Eyes- Gross vision intact, PERRLA, conjunctivae clear secretions            Ears-hearing normal            Nose- +turbinate edema, No-Septal dev, mucus, No-polyps,  erosion, perforation             Throat- Mallampati II , mucosa clear , drainage- none, tonsils- atrophic Neck- flexible , trachea midline, no stridor , thyroid nl, carotid no bruit Chest - symmetrical excursion , unlabored           Heart/CV- RRR , no murmur , no gallop  , no rub, nl s1 s2                           - JVD- none , edema- none, stasis changes- none, varices- none           Lung- clear to P&A, wheeze-none, unlabored, cough-none, dullness-none, rub- none           Chest wall-  Abd- Br/ Gen/ Rectal- Not done, not indicated Extrem- cyanosis- none, clubbing, none, atrophy- none, strength- nl. Cane. Neuro- grossly intact to observation Assessment & Plan:

## 2021-04-30 NOTE — Patient Instructions (Addendum)
We can continue CPAP auto 5-17   I do recommend you call [facility] to make a service appointment to check your machine since it seems to cut off during the night.  Please call if we can help  For an allergist in [place]- Dr Bruna Potter group    [facility]

## 2021-05-09 ENCOUNTER — Ambulatory Visit (INDEPENDENT_AMBULATORY_CARE_PROVIDER_SITE_OTHER): Payer: Medicare Other

## 2021-05-09 VITALS — BP 150/90 | HR 82

## 2021-05-09 DIAGNOSIS — I10 Essential (primary) hypertension: Secondary | ICD-10-CM | POA: Diagnosis not present

## 2021-05-09 NOTE — Progress Notes (Signed)
Patient was here for Blood pressure check. It was 150/90  pulse 82. Dr Tobie Poet recommended to continue with metoprolol, losartan/Hctz and take amlodipine 1/2 tablet every day. Check blood pressure and write them down.

## 2021-05-14 ENCOUNTER — Ambulatory Visit (INDEPENDENT_AMBULATORY_CARE_PROVIDER_SITE_OTHER): Payer: Medicare Other

## 2021-05-14 ENCOUNTER — Other Ambulatory Visit: Payer: Self-pay

## 2021-05-14 VITALS — BP 130/70 | HR 73 | Resp 16 | Ht 59.0 in | Wt 144.4 lb

## 2021-05-14 DIAGNOSIS — Z0000 Encounter for general adult medical examination without abnormal findings: Secondary | ICD-10-CM

## 2021-05-14 DIAGNOSIS — Z6829 Body mass index (BMI) 29.0-29.9, adult: Secondary | ICD-10-CM

## 2021-05-14 NOTE — Progress Notes (Signed)
Subjective:   Mindy Ingram is a 75 y.o. female who presents for Medicare Annual (Subsequent) preventive examination.  This wellness visit is conducted by a nurse.  The patient's medications were reviewed and reconciled since the patient's last visit.  History details were provided by the patient.  The history appears to be reliable.    Patient's last AWV was one year ago.   Medical History: Patient history and Family history was reviewed  Medications, Allergies, and preventative health maintenance was reviewed and updated.   Review of Systems    ROS-Negative Cardiac Risk Factors include: advanced age (>43men, >44 women);diabetes mellitus;dyslipidemia;hypertension     Objective:    Today's Vitals   05/14/21 1057  BP: 130/70  Pulse: 73  Resp: 16  SpO2: 93%  Weight: 144 lb 6.4 oz (65.5 kg)  Height: 4\' 11"  (1.499 m)  PainSc: 2   PainLoc: Foot   Body mass index is 29.17 kg/m.  Advanced Directives 05/14/2021 07/21/2013 07/12/2013  Does Patient Have a Medical Advance Directive? No Patient does not have advance directive Patient does not have advance directive  Would patient like information on creating a medical advance directive? No - Patient declined - -  Pre-existing out of facility DNR order (yellow form or pink MOST form) - No -    Current Medications (verified) Outpatient Encounter Medications as of 05/14/2021  Medication Sig   albuterol (PROVENTIL HFA;VENTOLIN HFA) 108 (90 Base) MCG/ACT inhaler Inhale 2 puffs every 4-6 hours as needed   amLODipine (NORVASC) 5 MG tablet TAKE 1/2 TABLET BY MOUTH DAILY (Patient taking differently: Take 2.5 mg by mouth daily as needed.)   aspirin EC 81 MG tablet Take 81 mg by mouth daily.   BESIVANCE 0.6 % SUSP Place 1 drop into the right eye 3 (three) times daily.   cetirizine (ZYRTEC) 10 MG tablet Take 1 tablet (10 mg total) by mouth daily as needed.   CHLOROPHYLL EX Apply 1 tablet topically daily.   Cholecalciferol (VITAMIN D PO)  Take 2,000 Units by mouth daily.    CRESTOR 10 MG tablet TAKE 1 TABLET BY MOUTH 3 TIMES A WEEK   Difluprednate 0.05 % EMUL Place 1 drop into the right eye 4 (four) times daily.   Elderberry 575 MG/5ML SYRP Take 5 mLs by mouth daily.   fluticasone (FLONASE) 50 MCG/ACT nasal spray 1-2 puffs each nostril once daily   ibuprofen (ADVIL) 800 MG tablet Take 1 tablet (800 mg total) by mouth every 8 (eight) hours as needed.   Inulin (FIBER CHOICE PO) Take 1 tablet by mouth daily as needed.   Lancets (ONETOUCH DELICA PLUS [id number]) MISC CHECK BLOOD SUGAR TWICE DAILY PRIOR TO MEAL   losartan-hydrochlorothiazide (HYZAAR) 100-25 MG tablet Take 1 tablet by mouth daily.   metFORMIN (GLUCOPHAGE-XR) 500 MG 24 hr tablet TAKE 2 TABLETS EVERY MORNING, AND 1 TABLET IN THE EVENING (Patient taking differently: Take 500 mg by mouth 2 (two) times daily.)   metoprolol (TOPROL-XL) 200 MG 24 hr tablet TAKE 1 TABLET BY MOUTH EVERY DAY   Multiple Vitamins-Minerals (COMPLETE ENERGY) TABS Take 1 tablet by mouth daily.   nitroGLYCERIN (NITROSTAT) 0.4 MG SL tablet Place 1 tablet (0.4 mg total) under the tongue every 5 (five) minutes as needed for chest pain.   Omega-3 Fatty Acids (FISH OIL) 1000 MG CAPS Take 1 capsule by mouth daily.   omeprazole (PRILOSEC) 20 MG capsule Take 1 capsule (20 mg total) by mouth daily.   ONETOUCH VERIO test strip 1  each by Other route daily.   PROLENSA 0.07 % SOLN SMARTSIG:1 Drop(s) Right Eye Every Evening   Semaglutide,0.25 or 0.5MG /DOS, (OZEMPIC, 0.25 OR 0.5 MG/DOSE,) 2 MG/1.5ML SOPN Inject 0.5 mg into the skin once a week.   traMADol (ULTRAM) 50 MG tablet Take 50 mg by mouth 2 (two) times daily as needed.   triamcinolone (KENALOG) 0.025 % ointment Apply 1 application topically 2 (two) times daily as needed.   triamcinolone cream (KENALOG) 0.1 % 1 application   valACYclovir (VALTREX) 500 MG tablet 1 tablet   azelastine (ASTELIN) 0.1 % nasal spray Place 1 spray into the nose 2 (two) times daily  as needed.   No facility-administered encounter medications on file as of 05/14/2021.    Allergies (verified) Dairy aid [tilactase], Lactose, Atorvastatin, Compazine [prochlorperazine edisylate], Hydrocodone-acetaminophen, Hydrocodone-acetaminophen, Milk-related compounds, Mirabegron, Other, Oxycontin [oxycodone hcl], Prochlorperazine, Prochlorperazine edisylate, Rosuvastatin, Shellfish allergy, Shellfish-derived products, Umeclidinium-vilanterol, and Percodan [oxycodone-aspirin]   History: Past Medical History:  Diagnosis Date   Allergic rhinitis    Asthma    Bronchitis    none in years   DJD (degenerative joint disease)    Fibromyalgia    GERD (gastroesophageal reflux disease)    Hypertension    OSA (obstructive sleep apnea)    cpap setting of 10   Otitis media    both ears with impacted cerumem   Rapid heartbeat    no metoprolol lin pm   Skin lesion 09/18/2016   Sleep apnea    Type II or unspecified type diabetes mellitus without mention of complication, not stated as uncontrolled    Past Surgical History:  Procedure Laterality Date   [place]  2013   lower back with plates and screws   benign lump right axilla     [place]   at [facility], slight limitation with turning neck   CHOLECYSTECTOMY  1995   EUS N/A 07/21/2013   Procedure: UPPER ENDOSCOPIC ULTRASOUND (EUS) LINEAR;  Surgeon: Milus Banister, MD;  Location: [facility];  Service: Endoscopy;  Laterality: N/A;   FOOT SURGERY     2007 - right great toe, 2000 - right fifth digit .   NEUROPLASTY / TRANSPOSITION MEDIAN NERVE AT [facility]   PARTIAL COLECTOMY  1995   benign adhesions   PARTIAL HYSTERECTOMY  1980s.   abdominal   right foot surgery  2000   right great toe with artificial bone inserted   right knee arthroscopy Right 02/2017   Family History  Problem Relation Age of Onset   Heart disease Mother     Prostate cancer Father    Heart attack Sister    Breast cancer Maternal Aunt    Prostate cancer Paternal Uncle    Allergies Neg Hx    Asthma Neg Hx    Eczema Neg Hx    Immunodeficiency Neg Hx    Social History   Socioeconomic History   Marital status: Widowed   Number of children: 1  Occupational History   Occupation: Retired  Tobacco Use   Smoking status: Never   Smokeless tobacco: Never  Vaping Use   Vaping Use: Never used  Substance and Sexual Activity   Alcohol use: No   Drug use: No   Sexual activity: Not on file   Social Determinants of Health   Financial Resource Strain: Not on file  Food Insecurity: No Food Insecurity   Worried About Running Out  of Food in the Last Year: Never true   [place] in the Last Year: Never true  Transportation Needs: No Transportation Needs   Lack of Transportation (Medical): No   Lack of Transportation (Non-Medical): No  Physical Activity: Not on file  Stress: Not on file  Social Connections: Not on file    Tobacco Counseling Counseling given: Patient does not use tobacco products   Clinical Intake:  Pre-visit preparation completed: Yes Pain : 0-10 Pain Score: 2  Pain Type: Acute pain Pain Location: Other (Comment) (foot and heel) Pain Orientation: Right Pain Descriptors / Indicators: Tender Pain Frequency: Intermittent   BMI - recorded: 29.17 Nutritional Status: BMI 25 -29 Overweight Nutritional Risks: None Diabetes: Yes (A1C 6.8) CBG done?: No Did pt. bring in CBG monitor from home?: No How often do you need to have someone help you when you read instructions, pamphlets, or other written materials from your doctor or pharmacy?: 1 - Never Interpreter Needed?: No    Activities of Daily Living In your present state of health, do you have any difficulty performing the following activities: 05/14/2021 12/25/2020  Hearing? N N  Vision? N N  Difficulty concentrating or making decisions? N N  Walking or climbing  stairs? N N  Dressing or bathing? N N  Doing errands, shopping? N N  Preparing Food and eating ? N -  Using the Toilet? N -  In the past six months, have you accidently leaked urine? N -  Do you have problems with loss of bowel control? N -  Managing your Medications? N -  Managing your Finances? N -  Housekeeping or managing your Housekeeping? N -  Some recent data might be hidden    Patient Care Team: [name], MD as PCP - General (Family Medicine) Nehemiah Settle, MD (Gastroenterology) Burnice Logan, [facility] (Inactive) as Pharmacist (Pharmacist) Hennie Duos, MD as Consulting Physician (Rheumatology) Sharyn Dross., DPM (Podiatry)     Assessment:   This is a routine wellness examination for [place].  Hearing/Vision screen No results found.  Dietary issues and exercise activities discussed: Current Exercise Habits: The patient does not participate in regular exercise at present, Exercise limited by: None identified  Depression Screen PHQ 2/9 Scores 05/14/2021 02/26/2021 12/25/2020 11/21/2020 09/18/2020  PHQ - 2 Score 0 0 0 0 0    Fall Risk Fall Risk  05/14/2021 02/26/2021 12/25/2020 09/18/2020  Falls in the past year? 0 0 0 0  Number falls in past yr: 0 0 0 0  Injury with Fall? 0 0 0 0  Risk for fall due to : No Fall Risks - No Fall Risks -  Follow up Falls evaluation completed;Falls prevention discussed - Falls evaluation completed -    FALL RISK PREVENTION PERTAINING TO THE HOME: Home free of loose throw rugs in walkways, pet beds, electrical cords, etc? Yes  Adequate lighting in your home to reduce risk of falls? Yes   ASSISTIVE DEVICES UTILIZED TO PREVENT FALLS:  Life alert? No  Use of a cane, walker or w/c? No  Grab bars in the bathroom? No  Shower chair or bench in shower? No  Elevated toilet seat or a handicapped toilet? No   Gait steady and fast without use of assistive device  Cognitive Function:     6CIT Screen 05/14/2021  What Year? 0 points   What month? 0 points  What time? 0 points  Count back from 20 0 points  Months in reverse 0  points  Repeat phrase 0 points  Total Score 0    Immunizations Immunization History  Administered Date(s) Administered   Fluad Quad(high Dose 65+) 04/08/2021   Hepatitis B, adult 07/01/2003   Influenza Split 03/12/2011, 03/30/2012, 04/01/2013, 04/04/2014, 04/03/2015, 04/02/2016, 03/30/2017, 04/14/2020   Influenza Whole 04/30/2009, 03/06/2011   Influenza, High Dose Seasonal PF 03/30/2017, 03/11/2018, 03/11/2018, 03/16/2019   Influenza,inj,Quad PF,6+ Mos 03/30/2013, 03/30/2014, 04/03/2015, 04/02/2016   Moderna Covid-19 Vaccine Bivalent Booster 47yrs & up 04/08/2021   Moderna Sars-Covid-2 Vaccination 08/05/2019, 08/31/2019, 04/25/2020   Pneumococcal Conjugate-13 02/16/2014   Pneumococcal Polysaccharide-23 06/30/1996, 10/28/2016   Td 07/01/2003   Tdap 01/16/2012   Zoster, Live 07/27/2013, 05/09/2020, 08/12/2020    TDAP status: Up to date  Flu Vaccine status: Up to date  Pneumococcal vaccine status: Up to date  Screening Tests Health Maintenance  Topic Date Due   Hepatitis C Screening  Never done   Zoster Vaccines- Shingrix (1 of 2) Never done   MAMMOGRAM  06/19/2021   HEMOGLOBIN A1C  10/07/2021   OPHTHALMOLOGY EXAM  01/01/2022   TETANUS/TDAP  01/15/2022   FOOT EXAM  04/08/2022   COLONOSCOPY (Pts 45-81yrs Insurance coverage will need to be confirmed)  06/12/2022   Pneumonia Vaccine 64+ Years old  Completed   INFLUENZA VACCINE  Completed   DEXA SCAN  Completed   COVID-19 Vaccine  Completed   HPV VACCINES  Aged Out    Health Maintenance  Health Maintenance Due  Topic Date Due   Hepatitis C Screening  Never done   Zoster Vaccines- Shingrix (1 of 2) Never done    Colorectal cancer screening: Type of screening: Colonoscopy. Completed 06/12/2017. Repeat every 5 years  Mammogram status: Completed 06/19/20. Repeat every year  Bone Density status: Completed 2018.   Lung  Cancer Screening: (Low Dose CT Chest recommended if Age 70-80 years, 30 pack-year currently smoking OR have quit w/in 15years.) does not qualify.   Additional Screening:  Vision Screening: Recommended annual ophthalmology exams for early detection of glaucoma and other disorders of the eye. Is the patient up to date with their annual eye exam?  Yes  Who is the provider or what is the name of the office in which the patient attends annual eye exams? [place] Screening: Recommended annual dental exams for proper oral hygiene    Plan:    1- Mammogram scheduled for December 2- Colonoscopy will be due December 2023 3- Continue healthy diet and increase activity as tolerated 4- Information given about advance directive - patient will bring a copy once completed  I have personally reviewed and noted the following in the patient's chart:   Medical and social history Use of alcohol, tobacco or illicit drugs  Current medications and supplements including opioid prescriptions.  Functional ability and status Nutritional status Physical activity Advanced directives List of other physicians Hospitalizations, surgeries, and ER visits in previous 12 months Vitals Screenings to include cognitive, depression, and falls Referrals and appointments  In addition, I have reviewed and discussed with patient certain preventive protocols, quality metrics, and best practice recommendations. A written personalized care plan for preventive services as well as general preventive health recommendations were provided to patient.     Erie Noe, LPN   [id number]

## 2021-05-14 NOTE — Patient Instructions (Signed)

## 2021-05-15 ENCOUNTER — Telehealth: Payer: Self-pay

## 2021-05-15 ENCOUNTER — Other Ambulatory Visit: Payer: Self-pay | Admitting: Family Medicine

## 2021-05-15 NOTE — Chronic Care Management (AMB) (Signed)
Chronic Care Management Pharmacy Assistant   Name: Mindy Ingram  MRN: [id number] DOB: [birth year]   Reason for Encounter: General Adherence Call    Recent office visits:  05/14/21 Rhae Hammock LPN. Annual Wellness Exam. Ordered Colonoscopy and Diabetes Eye Exam. No med changes.   05/09/21 Leal-Borjas Marla CMA. Seen for BP check. 150/90 pulse 82. Recommended continue with metoprolol, losartan/Hctz and take amlodipine 1/2 tablet every day  04/23/21 [name] MD. Seen for Lateral Epicondylitis of right Elbow. Changed Losartan Potassium-HCTZ from 50-12.5 mg daily to 100-25 mg daily. Recommended CoQ10 daily.  04/08/21 [name] MD. Seen for DM and HTN. Started Semaglutide 0.5 mg weekly. D/C Acyclovir 200 mg 5 times daily and Gatifloxacin 0.5% drops. Instructions were given to Stop metformin. Start on ozempic 0.25 mg once weekly x 4 weeks, then increase to 0.5 mg weekly.  Recent consult visits:  04/30/21 (Pulmonology) Baird Lyons MD. Seen for follow up. No med changes.  Hospital visits:  None since 03/21/21  Medications: Outpatient Encounter Medications as of 05/15/2021  Medication Sig   albuterol (PROVENTIL HFA;VENTOLIN HFA) 108 (90 Base) MCG/ACT inhaler Inhale 2 puffs every 4-6 hours as needed   amLODipine (NORVASC) 5 MG tablet TAKE 1/2 TABLET BY MOUTH DAILY (Patient taking differently: Take 2.5 mg by mouth daily as needed.)   aspirin EC 81 MG tablet Take 81 mg by mouth daily.   azelastine (ASTELIN) 0.1 % nasal spray Place 1 spray into the nose 2 (two) times daily as needed.   BESIVANCE 0.6 % SUSP Place 1 drop into the right eye 3 (three) times daily.   cetirizine (ZYRTEC) 10 MG tablet Take 1 tablet (10 mg total) by mouth daily as needed.   CHLOROPHYLL EX Apply 1 tablet topically daily.   Cholecalciferol (VITAMIN D PO) Take 2,000 Units by mouth daily.    CRESTOR 10 MG tablet TAKE 1 TABLET BY MOUTH 3 TIMES A WEEK   Difluprednate 0.05 % EMUL Place 1 drop into the  right eye 4 (four) times daily.   Elderberry 575 MG/5ML SYRP Take 5 mLs by mouth daily.   fluticasone (FLONASE) 50 MCG/ACT nasal spray 1-2 puffs each nostril once daily   ibuprofen (ADVIL) 800 MG tablet Take 1 tablet (800 mg total) by mouth every 8 (eight) hours as needed.   Inulin (FIBER CHOICE PO) Take 1 tablet by mouth daily as needed.   Lancets (ONETOUCH DELICA PLUS [id number]) MISC CHECK BLOOD SUGAR TWICE DAILY PRIOR TO MEAL   losartan-hydrochlorothiazide (HYZAAR) 100-25 MG tablet Take 1 tablet by mouth daily.   metFORMIN (GLUCOPHAGE-XR) 500 MG 24 hr tablet TAKE 2 TABLETS EVERY MORNING, AND 1 TABLET IN THE EVENING (Patient taking differently: Take 500 mg by mouth 2 (two) times daily.)   metoprolol (TOPROL-XL) 200 MG 24 hr tablet TAKE 1 TABLET BY MOUTH EVERY DAY   Multiple Vitamins-Minerals (COMPLETE ENERGY) TABS Take 1 tablet by mouth daily.   nitroGLYCERIN (NITROSTAT) 0.4 MG SL tablet Place 1 tablet (0.4 mg total) under the tongue every 5 (five) minutes as needed for chest pain.   Omega-3 Fatty Acids (FISH OIL) 1000 MG CAPS Take 1 capsule by mouth daily.   omeprazole (PRILOSEC) 20 MG capsule Take 1 capsule (20 mg total) by mouth daily.   ONETOUCH VERIO test strip 1 each by Other route daily.   PROLENSA 0.07 % SOLN SMARTSIG:1 Drop(s) Right Eye Every Evening   Semaglutide,0.25 or 0.5MG /DOS, (OZEMPIC, 0.25 OR 0.5 MG/DOSE,) 2 MG/1.5ML SOPN Inject 0.5 mg into the  skin once a week.   traMADol (ULTRAM) 50 MG tablet Take 50 mg by mouth 2 (two) times daily as needed.   triamcinolone (KENALOG) 0.025 % ointment Apply 1 application topically 2 (two) times daily as needed.   triamcinolone cream (KENALOG) 0.1 % 1 application   valACYclovir (VALTREX) 500 MG tablet 1 tablet   No facility-administered encounter medications on file as of 05/15/2021.   Contacted [name] for general disease state and medication adherence call.   Patient is not > 5 days past due for refill on the following  medications per chart history:  [facility] Medications: Medication Name/mg Last Fill Days Supply Losartan-HCTZ 100-25 mg 04/23/21 90ds Semaglutide 0.5 mg Pt decided not to take. She looked up side effects and feels she wants to wait on taking this Metformin 500 mg  02/19/21 90ds - Pt has an oversupply due to her stopping it and restarted it    What concerns do you have about your medications? Pt stated the increase of her Losartan has not touched her BP readings. Pt stated her blood pressure readings are still 150/80.   The patient denies side effects with her medications.   How often do you forget or accidentally miss a dose? Never  Do you use a pillbox? Yes  Are you having any problems getting your medications from your pharmacy? No  Has the cost of your medications been a concern? No  Since last visit with CPP, the following interventions have been made: Dr. Tobie Poet increased pt Losartan-HCTZ   The patient has not had an ED visit since last contact.   The patient report  problems with their health pt is concerned that her BP readings are still running high with the increase of Losartan and she does not want to take 3 different BP meds when their is no change. Pt stated she use to take Verapamil and it helped  keep her BP down but caused her a cough.    Patient states BP and BG readings are as follows Blood pressure: 05/15/21 152/86 before meds  144/81 after meds 139/78 after meds  134/75 after meds   Blood sugars: 05/15/21  118 05/14/21 175  141 05/13/21 153 after meal  112 before meals  05/12/21 114 before meals     Care Gaps: Last annual wellness visit? 53/66/44 If applicable: Last eye exam / retinopathy screening? 01/01/21 Diabetic foot exam? 04/08/21 Colonoscopy: 06/12/17 Dexa: 02/20/21 Mammogram: Scheduled 06/25/21    Elray Mcgregor, [place] Pharmacist Assistant  [phone]

## 2021-05-16 NOTE — Telephone Encounter (Signed)
A1c looks at goal, no need to try to convince her to take Ozempic.  BP starts high but after meds kick in, is at goal

## 2021-06-05 ENCOUNTER — Telehealth: Payer: Self-pay

## 2021-06-05 NOTE — Telephone Encounter (Signed)
Patient called questioning rather she is to continue taking Losartan even if her b/p is 120/70"s. Per Dr. Tobie Poet patient is to continue all her medications as directed as it is because of the medications her b/p is within normal range. Patient aware and verbalized understanding.

## 2021-06-25 ENCOUNTER — Ambulatory Visit
Admission: RE | Admit: 2021-06-25 | Discharge: 2021-06-25 | Disposition: A | Discharge status: Home / Self Care | Payer: Medicare Other | Source: Ambulatory Visit | Attending: Family Medicine | Admitting: Family Medicine

## 2021-06-25 DIAGNOSIS — Z1231 Encounter for screening mammogram for malignant neoplasm of breast: Secondary | ICD-10-CM | POA: Diagnosis not present

## 2021-07-02 ENCOUNTER — Telehealth: Payer: Self-pay

## 2021-07-02 NOTE — Progress Notes (Signed)
07/02/21- Pt confirmed appt for tomorrow 07/03/21 with CPP.  Elray Mcgregor, [place] Pharmacist Assistant  [phone]

## 2021-07-03 ENCOUNTER — Ambulatory Visit (INDEPENDENT_AMBULATORY_CARE_PROVIDER_SITE_OTHER): Payer: Medicare Other

## 2021-07-03 ENCOUNTER — Other Ambulatory Visit: Payer: Self-pay

## 2021-07-03 DIAGNOSIS — K219 Gastro-esophageal reflux disease without esophagitis: Secondary | ICD-10-CM

## 2021-07-03 DIAGNOSIS — I10 Essential (primary) hypertension: Secondary | ICD-10-CM

## 2021-07-03 DIAGNOSIS — E1169 Type 2 diabetes mellitus with other specified complication: Secondary | ICD-10-CM

## 2021-07-03 DIAGNOSIS — E782 Mixed hyperlipidemia: Secondary | ICD-10-CM

## 2021-07-03 NOTE — Patient Instructions (Signed)
Visit Information   Goals Addressed   None    Patient Care Plan: CCM Pharmacy Care Plan     Problem Identified: dm,htn, hld   Priority: High  Onset Date: 11/22/2020     Long-Range Goal: Disease State Management   Start Date: 11/22/2020  Expected End Date: 11/22/2021  Recent Progress: On track  Priority: High  Note:   Current Barriers:  Unable to independently afford treatment regimen  Pharmacist Clinical Goal(s):  Patient will verbalize ability to afford treatment regimen through collaboration with PharmD and provider.   Interventions: 1:1 collaboration with Cox, Kirsten, MD regarding development and update of comprehensive plan of care as evidenced by provider attestation and co-signature Inter-disciplinary care team collaboration (see longitudinal plan of care) Comprehensive medication review performed; medication list updated in electronic medical record  Hypertension (BP goal <130/80) BP Readings from Last 3 Encounters:  05/14/21 130/70  05/09/21 (!) 150/90  04/30/21 (!) 144/64  -Controlled -Current treatment: amlodipine 2.5 mg daily Appropriate, Effective, Safe, Accessible Losartan-hydrochlorothiazide 100/25 mg daily Appropriate, Effective, Safe, Accessible Metoprolol succinate 200 mg daily Appropriate, Effective, Safe, Accessible -Medications previously tried: none reported  -Current home readings:  160/88 mmHg is highest ever with meloxicam. She knows goal is <130/80 mmHg. Most readings are well controlled.  -Current dietary habits: eats breakfast and supper mainly.  -Current exercise habits: Was previously in karate and exercise class. She hopes to rejoin the class soon. Had stopped prior to [place].  -Denies hypotensive/hypertensive symptoms -Educated on BP goals and benefits of medications for prevention of heart attack, stroke and kidney damage; Daily salt intake goal < 2300 mg; Exercise goal of 150 minutes per week; Importance of home blood pressure  monitoring; -Counseled to monitor BP at home daily, document, and provide log at future appointments -Counseled on diet and exercise extensively Recommended to continue current medication Recommended patient return to exercise class.   Hyperlipidemia: (LDL goal < 100) The 10-year ASCVD risk score (Arnett DK, et al., 2019) is: 28.3%   Values used to calculate the score:     Age: 76 years     Sex: Female     Is Non-Hispanic African American: Yes     Diabetic: Yes     Tobacco smoker: No     Systolic Blood Pressure: 510 mmHg     Is BP treated: Yes     HDL Cholesterol: 54 mg/dL     Total Cholesterol: 162 mg/dL Lab Results  Component Value Date   CHOL 162 04/08/2021   CHOL 162 12/25/2020   CHOL 171 09/20/2020   Lab Results  Component Value Date   HDL 54 04/08/2021   HDL 53 12/25/2020   HDL 58 09/20/2020   Lab Results  Component Value Date   LDLCALC 90 04/08/2021   LDLCALC 86 12/25/2020   LDLCALC 95 09/20/2020   Lab Results  Component Value Date   TRIG 101 04/08/2021   TRIG 129 12/25/2020   TRIG 102 09/20/2020   Lab Results  Component Value Date   CHOLHDL 3.0 04/08/2021   CHOLHDL 3.1 12/25/2020   CHOLHDL 2.9 09/20/2020  No results found for: LDLDIRECT -Controlled -Current treatment: aspirin ec 81 mg daily Appropriate, Effective, Safe, Accessible Crestor (Brand) 10 mg twice weekly Appropriate, Effective, Safe, Accessible -Medications previously tried: brand name Crestor, atorvastatin -Current dietary patterns: reports breakfast meat and hot dogs  -Current exercise habits: was previously involved in karate and exercise class prior to [place] on Cholesterol goals;  Benefits of statin for ASCVD  risk reduction; Importance of limiting foods high in cholesterol; Exercise goal of 150 minutes per week; -Recommended considering Crestor 10 mg three times weekly. Patient reports she can tolerate brand name Crestor but the generic leaves her achy. Pharmacist to request  Dispense as Written for brand name Crestor.  Assessed patient finances. Patient approved for [employer] to cover cost of Crestor.   Diabetes (A1c goal <7%) Lab Results  Component Value Date   HGBA1C 6.8 (H) 04/08/2021   HGBA1C 6.8 (H) 12/25/2020   HGBA1C 6.7 (H) 09/20/2020   Lab Results  Component Value Date   MICROALBUR 10 12/25/2020   LDLCALC 90 04/08/2021   CREATININE 0.81 04/08/2021   Lab Results  Component Value Date   NA 144 04/08/2021   K 3.6 04/08/2021   CREATININE 0.81 04/08/2021   EGFR 76 04/08/2021   GFRNONAA >60 09/14/2008   GLUCOSE 124 (H) 04/08/2021   Lab Results  Component Value Date   WBC 4.2 04/08/2021   HGB 13.5 04/08/2021   HCT 41.3 04/08/2021   MCV 94 04/08/2021   PLT 296 04/08/2021  -Controlled -Current medications: Metformin xr 500 mg twice daily Appropriate, Effective, Safe, Accessible One touch verio test strips twice daily  Lancets daily prior to meal  -Medications previously tried: Metformin 559m 3/day (ADR's) -Current home glucose readings fasting glucose:  May 2022: 120, 121, 136, 156, 124, 128, 116, 128, 164 (birthday cake), 143, 168 (birthday cake) Jan 2023: Didn't have readings on her -Denies hypoglycemic/hyperglycemic symptoms -Current meal patterns:  Late breakfast or early lunch: egg, bacon or sausage with grits or pancake occasionally (a few times a month). [place] ClapCroissant once a week as a treat.   dinner: dines out mainly.  snacks:  more birthday cake lately since celebrating all month long),  drinks: unsweet tea, coffee  -Current exercise: limited since not attending exercise class after COVID -Educated on A1c and blood sugar goals; Complications of diabetes including kidney damage, retinal damage, and cardiovascular disease; Exercise goal of 150 minutes per week; Benefits of routine self-monitoring of blood sugar; Carbohydrate counting and/or plate method -Counseled to check feet daily and get yearly eye  exams -Counseled on diet and exercise extensively Recommended to continue current medication Jan 2023: Patient doesn't want to increase Metformin to 3/day nor does she want to start Ozempic. Updated medslist and will let PCP know     Disc Degeneration (Goal: manage pain) -Pain Scale  -Jan 2023: Patient didn't specify, only said she is very content -Controlled -Current treatment  tramadol 50 mg bid prn  Meloxicam 161mQD PRN -Medications previously tried:N/A -Recommended to continue current medication   Patient Goals/Self-Care Activities Patient will:  - take medications as prescribed focus on medication adherence by taking medication as needed check glucose daily, document, and provide at future appointments check blood pressure daily, document, and provide at future appointments target a minimum of 150 minutes of moderate intensity exercise weekly engage in dietary modifications by limiting carbohydrates and fried/fatty foods. Encouraged lean protein, fruits and vegetables.   Follow Up Plan: Telephone follow up appointment with care management team member scheduled for: 06/2021      The patient verbalized understanding of instructions, educational materials, and care plan provided today and declined offer to receive copy of patient instructions, educational materials, and care plan.  The pharmacy team will reach out to the patient again over the next 30 days.   NaLane HackerRPTexas Scottish Rite Hospital For [facility]

## 2021-07-03 NOTE — Progress Notes (Signed)
Chronic Care Management Pharmacy Note  07/03/2021 Name:  Mindy Ingram MRN:  [id number] DOB:  Apr 08, 1946   Summary: Very pleasant 76 year old female presents for f/u CCM visit. She just completed her BS in Theology at the age of 76. She was married at 9 and has 1 child with 5 grandchildren. In her free time, she loved taking Karate classes but when Covid started, she had to stop   Plan Updates:  Patient not interested in starting Ozempic nor increasing Metformin from 2->3 tabs/day. Updated medslist   Subjective: Mindy Ingram is an 76 y.o. year old female who is a primary patient of Cox, Kirsten, MD.  The CCM team was consulted for assistance with disease management and care coordination needs.    Engaged with patient by telephone for initial visit in response to provider referral for pharmacy case management and/or care coordination services.   Consent to Services:  The patient was given the following information about Chronic Care Management services today, agreed to services, and gave verbal consent: 1. CCM service includes personalized support from designated clinical staff supervised by the primary care provider, including individualized plan of care and coordination with other care providers 2. 24/7 contact phone numbers for assistance for urgent and routine care needs. 3. Service will only be billed when office clinical staff spend 20 minutes or more in a month to coordinate care. 4. Only one practitioner may furnish and bill the service in a calendar month. 5.The patient may stop CCM services at any time (effective at the end of the month) by phone call to the office staff. 6. The patient will be responsible for cost sharing (co-pay) of up to 20% of the service fee (after annual deductible is met). Patient agreed to services and consent obtained.  Patient Care Team: [name], MD as PCP - General (Family Medicine) Nehemiah Settle, MD (Gastroenterology) Hennie Duos,  MD as Consulting Physician (Rheumatology) Sharyn Dross., DPM (Podiatry) Lane Hacker, [facility] (Pharmacist)  Recent office visits:  09/18/2020: [name], MD (PCP) for HTN/ Discontinued Amlodipine, Carisoprodol, and Meloxicam. Completed course of Methocarbamol. Change in therapy: Discontinued Valsartan-Hydrochlorothiazide 160-12.5 mg.      Recent consult visits:  07/03/2020: [name], MD (Rheumatology) for joint pain / labs drawn, X-ray (R) elbow/ no medication changes noted    05/31/2020: [name], MD (Cardiology) for HTN/ EKG obtained / no medication changes noted      Hospital visits:  No hospital visits noted within the past 6 months   Objective:  Lab Results  Component Value Date   CREATININE 0.81 04/08/2021   BUN 11 04/08/2021   GFRNONAA >60 09/14/2008   GFRAA  09/14/2008    >60        The eGFR has been calculated using the MDRD equation. This calculation has not been validated in all clinical situations. eGFR's persistently <60 mL/min signify possible Chronic Kidney Disease.   NA 144 04/08/2021   K 3.6 04/08/2021   CALCIUM 9.5 04/08/2021   CO2 27 04/08/2021   GLUCOSE 124 (H) 04/08/2021    Lab Results  Component Value Date/Time   HGBA1C 6.8 (H) 04/08/2021 11:38 AM   HGBA1C 6.8 (H) 12/25/2020 10:58 AM   MICROALBUR 10 12/25/2020 10:17 AM    Last diabetic Eye exam:  Lab Results  Component Value Date/Time   HMDIABEYEEXA No Retinopathy 01/01/2021 12:00 AM    Last diabetic Foot exam: No results found for: HMDIABFOOTEX   Lab Results  Component  Value Date   CHOL 162 04/08/2021   HDL 54 04/08/2021   LDLCALC 90 04/08/2021   TRIG 101 04/08/2021   CHOLHDL 3.0 04/08/2021    Hepatic Function Latest Ref Rng & Units 04/08/2021 12/25/2020 09/20/2020  Total Protein 6.0 - 8.5 g/dL 6.7 6.9 6.9  Albumin 3.7 - 4.7 g/dL 4.6 4.5 4.5  AST 0 - 40 IU/L 17 15 20   ALT 0 - 32 IU/L 12 14 10   Alk Phosphatase 44 - 121 IU/L 47 47 45  Total Bilirubin 0.0 - 1.2  mg/dL 0.3 0.4 0.3    Lab Results  Component Value Date/Time   TSH 1.450 09/20/2020 10:21 AM    CBC Latest Ref Rng & Units 04/08/2021 12/25/2020 09/20/2020  WBC 3.4 - 10.8 x10E3/uL 4.2 5.4 4.6  Hemoglobin 11.1 - 15.9 g/dL 13.5 13.2 13.4  Hematocrit 34.0 - 46.6 % 41.3 40.8 40.0  Platelets 150 - 450 x10E3/uL 296 265 283    No results found for: VD25OH  Clinical ASCVD: No  The 10-year ASCVD risk score (Arnett DK, et al., 2019) is: 28.3%   Values used to calculate the score:     Age: 76 years     Sex: Female     Is Non-Hispanic African American: Yes     Diabetic: Yes     Tobacco smoker: No     Systolic Blood Pressure: 650 mmHg     Is BP treated: Yes     HDL Cholesterol: 54 mg/dL     Total Cholesterol: 162 mg/dL    Depression screen [facility] 2/9 05/14/2021 02/26/2021 12/25/2020  Decreased Interest 0 0 0  Down, Depressed, Hopeless 0 0 0  PHQ - 2 Score 0 0 0     Other: (CHADS2VASc if Afib, MMRC or CAT for COPD, ACT, DEXA)  Social History   Tobacco Use  Smoking Status Never  Smokeless Tobacco Never   BP Readings from Last 3 Encounters:  05/14/21 130/70  05/09/21 (!) 150/90  04/30/21 (!) 144/64   Pulse Readings from Last 3 Encounters:  05/14/21 73  05/09/21 82  04/30/21 67   Wt Readings from Last 3 Encounters:  05/14/21 144 lb 6.4 oz (65.5 kg)  04/30/21 145 lb 3.2 oz (65.9 kg)  04/23/21 142 lb (64.4 kg)   BMI Readings from Last 3 Encounters:  05/14/21 29.17 kg/m  04/30/21 29.33 kg/m  04/23/21 27.73 kg/m    Assessment/Interventions: Review of patient past medical history, allergies, medications, health status, including review of consultants reports, laboratory and other test data, was performed as part of comprehensive evaluation and provision of chronic care management services.   SDOH:  (Social Determinants of Health) assessments and interventions performed: Yes SDOH Interventions    Flowsheet Row Most Recent Value  SDOH Interventions   Financial Strain  Interventions Intervention Not Indicated  Transportation Interventions Intervention Not Indicated      SDOH Screenings   Alcohol Screen: Low Risk    Last Alcohol Screening Score (AUDIT): 0  Depression (PHQ2-9): Low Risk    PHQ-2 Score: 0  Financial Resource Strain: Low Risk    Difficulty of Paying Living Expenses: Not hard at all  Food Insecurity: No Food Insecurity   Worried About Charity fundraiser in the Last Year: Never true   Ran Out of Food in the Last Year: Never true  Housing: Low Risk    Last Housing Risk Score: 0  Physical Activity: Not on file  Social Connections: Not on file  Stress: Not on  file  Tobacco Use: Low Risk    Smoking Tobacco Use: Never   Smokeless Tobacco Use: Never   Passive Exposure: Not on file  Transportation Needs: No Transportation Needs   Lack of Transportation (Medical): No   Lack of Transportation (Non-Medical): No    CCM Care Plan  Allergies  Allergen Reactions   Dairy Aid [Tilactase]     Hives, diarrhea   Lactose Anaphylaxis and Other (See Comments)   Atorvastatin Other (See Comments)    Severe myalgias   Compazine [Prochlorperazine Edisylate]    Hydrocodone-Acetaminophen     REACTION: sore throat with vicodin   Hydrocodone-Acetaminophen Other (See Comments)    Other reaction(s): Other (See Comments) Sore throat , like it is closing up on her REACTION: sore throat with vicodin    Milk-Related Compounds Other (See Comments)   Mirabegron Other (See Comments)    Headache    Other Other (See Comments)    Other reaction(s): headache Other reaction(s): confused   Oxycontin [Oxycodone Hcl]     "makes me crazy"   Prochlorperazine Other (See Comments)   Prochlorperazine Edisylate     REACTION: stroke-like symptoms with companzine   Rosuvastatin     Can take brand crestor-severe myalgias   Shellfish Allergy    Shellfish-Derived Products Other (See Comments)   Umeclidinium-Vilanterol Other (See Comments)    Lactose allergy    Percodan [Oxycodone-Aspirin] Palpitations    Fast heartbeat with percodan    Medications Reviewed Today     Reviewed by Lane Hacker, [facility] (Pharmacist) on 07/03/21 at 1413  Med List Status: <None>   Medication Order Taking? Sig Documenting Provider Last Dose Status Informant  albuterol (PROVENTIL HFA;VENTOLIN HFA) 108 (90 Base) MCG/ACT inhaler [id number]  Inhale 2 puffs every 4-6 hours as needed Baird Lyons D, MD  Active   amLODipine (NORVASC) 5 MG tablet [id number] Yes TAKE 1/2 TABLET BY MOUTH DAILY  Patient taking differently: Take 2.5 mg by mouth daily as needed.   [name], PA-C Taking Active   aspirin EC 81 MG tablet [id number]  Take 81 mg by mouth daily. [provider]  Active   azelastine (ASTELIN) 0.1 % nasal spray [id number]  Place 1 spray into the nose 2 (two) times daily as needed. [provider]  Expired 02/26/21 2359   BESIVANCE 0.6 % SUSP [id number]  Place 1 drop into the right eye 3 (three) times daily. [provider]  Active   cetirizine (ZYRTEC) 10 MG tablet [id number]  Take 1 tablet (10 mg total) by mouth daily as needed. [name], MD  Active   CHLOROPHYLL [place] [id number]  Apply 1 tablet topically daily. [provider]  Active   Cholecalciferol (VITAMIN D PO) [id number]  Take 2,000 Units by mouth daily.  [provider]  Active   CRESTOR 10 MG tablet [id number]  TAKE 1 TABLET BY MOUTH 3 TIMES A WEEK Cox, Kirsten, MD  Active   Difluprednate 0.05 % EMUL [id number]  Place 1 drop into the right eye 4 (four) times daily. [provider]  Active   Elderberry 575 MG/5ML SYRP [id number]  Take 5 mLs by mouth daily. [provider]  Active   fluticasone (FLONASE) 50 MCG/ACT nasal spray [id number]  1-2 puffs each nostril once daily Baird Lyons D, MD  Active   ibuprofen (ADVIL) 800 MG tablet [id number]  Take 1 tablet (800 mg total) by mouth every 8 (eight) hours as needed. Cox, Kirsten, MD  Active   Inulin (FIBER  CHOICE  PO) [id number]  Take 1 tablet by mouth daily as needed. [provider]  Active   Lancets [facility] DELICA PLUS [id number]) [place] [id number]  [place] TO MEAL [provider]  Active   losartan-hydrochlorothiazide (HYZAAR) 100-25 MG tablet [id number] Yes Take 1 tablet by mouth daily. Cox, Kirsten, MD Taking Active   metFORMIN (GLUCOPHAGE-XR) 500 MG 24 hr tablet [id number] Yes TAKE 2 TABLETS EVERY MORNING, AND 1 TABLET IN THE EVENING  Patient taking differently: Take 500 mg by mouth 2 (two) times daily.   Cox, Kirsten, MD Taking Active   metoprolol (TOPROL-XL) 200 MG 24 hr tablet [id number] Yes TAKE 1 TABLET BY MOUTH EVERY DAY Cox, Kirsten, MD Taking Active   Multiple Vitamins-Minerals (COMPLETE ENERGY) TABS [phone]  Take 1 tablet by mouth daily. [provider]  Active Self  nitroGLYCERIN (NITROSTAT) 0.4 MG SL tablet [id number]  Place 1 tablet (0.4 mg total) under the tongue every 5 (five) minutes as needed for chest pain. Revankar, Reita Cliche, MD  Active   Omega-3 Fatty Acids (FISH OIL) 1000 MG CAPS [id number]  Take 1 capsule by mouth daily. [provider]  Active   omeprazole (PRILOSEC) 20 MG capsule [id number]  Take 1 capsule (20 mg total) by mouth daily. [name], MD  Active   [facility] VERIO test strip [id number]  1 each by Other route daily. [provider]  Active   PROLENSA 0.07 % SOLN [id number]  SMARTSIG:1 Drop(s) Right Eye Every Evening [provider]  Active   Semaglutide,0.25 or 0.5MG/DOS, (OZEMPIC, 0.25 OR 0.5 MG/DOSE,) 2 MG/1.5ML SOPN [id number]  Inject 0.5 mg into the skin once a week. Cox, Kirsten, MD  Active   traMADol (ULTRAM) 50 MG tablet [id number]  Take 50 mg by mouth 2 (two) times daily as needed. [provider]  Active   triamcinolone (KENALOG) 0.025 % ointment [id number]  Apply 1 application topically 2 (two) times daily as needed. [provider]  Active   triamcinolone cream  (KENALOG) 0.1 % [id number]  1 application [provider]  Active   valACYclovir (VALTREX) 500 MG tablet [id number]  1 tablet [provider]  Active   Med List Note Annamaria Boots, Tarri Fuller D, MD 03/09/11 1408): CPAP 12, reduced to 10 as of 03/06/2011            Patient Active Problem List   Diagnosis Date Noted   Neck muscle spasm 04/26/2021   Fibromyalgia 04/10/2021   DM type 2 with diabetic mixed hyperlipidemia ([place]) 04/08/2021   Contusion of head, initial encounter 03/18/2021   Light headedness 03/18/2021   Alopecia 06/25/2020   Shoulder joint pain 06/25/2020   Costochondritis 06/25/2020   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   Corn of toe 06/25/2020   Cramp in lower leg associated with rest 06/25/2020   Eructation 06/25/2020   Hyperlipidemia 06/25/2020   Lateral epicondylitis 06/25/2020   Menopause 06/25/2020   Multiple benign melanocytic nevi 06/25/2020   Other long term (current) drug therapy 06/25/2020   Vitamin D deficiency 06/25/2020   Sleep apnea    OSA (obstructive sleep apnea)    Chronic back pain    DJD (degenerative joint disease)    Bilateral impacted cerumen 07/01/2018   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   Encounter for immunization 03/15/2018   Acquired hammer toe of right foot 08/11/2017   Upper airway cough syndrome 09/24/2015   Dyslipidemia 01/23/2015   Essential hypertension 01/23/2015   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  Degeneration of lumbosacral intervertebral disc 01/26/2012   Low back pain 01/26/2012   AODM 10/17/2007   Seasonal and perennial allergic rhinitis 10/17/2007   Allergic asthma, mild intermittent, uncomplicated 94/49/6759   GERD 10/17/2007   Asthmatic bronchitis, mild intermittent, with acute exacerbation 10/11/2007    Immunization History  Administered Date(s) Administered   Fluad Quad(high Dose 65+) 04/08/2021   Hepatitis B, adult 07/01/2003   Influenza Split 03/12/2011, 03/30/2012, 04/01/2013, 04/04/2014,  04/03/2015, 04/02/2016, 03/30/2017, 04/14/2020   Influenza Whole 04/30/2009, 03/06/2011   Influenza, High Dose Seasonal PF 03/30/2017, 03/11/2018, 03/11/2018, 03/16/2019   Influenza,inj,Quad PF,6+ Mos 03/30/2013, 03/30/2014, 04/03/2015, 04/02/2016   Moderna Covid-19 Vaccine Bivalent Booster 79yr & up 04/08/2021   Moderna Sars-Covid-2 Vaccination 08/05/2019, 08/31/2019, 04/25/2020   Pneumococcal Conjugate-13 02/16/2014   Pneumococcal Polysaccharide-23 06/30/1996, 10/28/2016   Td 07/01/2003   Tdap 01/16/2012   Zoster, Live 07/27/2013, 05/09/2020, 08/12/2020    Conditions to be addressed/monitored:  Hypertension, Hyperlipidemia, Diabetes, GERD and vitamin D deficiency, obstructive sleep apnea.   Care Plan : CCM Pharmacy Care Plan  Updates made by KLane Hacker RPH since 07/03/2021 12:00 AM     Problem: dm,htn, hld   Priority: High  Onset Date: 11/22/2020     Long-Range Goal: Disease State Management   Start Date: 11/22/2020  Expected End Date: 11/22/2021  Recent Progress: On track  Priority: High  Note:   Current Barriers:  Unable to independently afford treatment regimen  Pharmacist Clinical Goal(s):  Patient will verbalize ability to afford treatment regimen through collaboration with PharmD and provider.   Interventions: 1:1 collaboration with Cox, Kirsten, MD regarding development and update of comprehensive plan of care as evidenced by provider attestation and co-signature Inter-disciplinary care team collaboration (see longitudinal plan of care) Comprehensive medication review performed; medication list updated in electronic medical record  Hypertension (BP goal <130/80) BP Readings from Last 3 Encounters:  05/14/21 130/70  05/09/21 (!) 150/90  04/30/21 (!) 144/64  -Controlled -Current treatment: amlodipine 2.5 mg daily Appropriate, Effective, Safe, Accessible Losartan-hydrochlorothiazide 100/25 mg daily Appropriate, Effective, Safe, Accessible Metoprolol  succinate 200 mg daily Appropriate, Effective, Safe, Accessible -Medications previously tried: none reported  -Current home readings:  160/88 mmHg is highest ever with meloxicam. She knows goal is <130/80 mmHg. Most readings are well controlled.  -Current dietary habits: eats breakfast and supper mainly.  -Current exercise habits: Was previously in karate and exercise class. She hopes to rejoin the class soon. Had stopped prior to [place]  -Denies hypotensive/hypertensive symptoms -Educated on BP goals and benefits of medications for prevention of heart attack, stroke and kidney damage; Daily salt intake goal < 2300 mg; Exercise goal of 150 minutes per week; Importance of home blood pressure monitoring; -Counseled to monitor BP at home daily, document, and provide log at future appointments -Counseled on diet and exercise extensively Recommended to continue current medication Recommended patient return to exercise class.   Hyperlipidemia: (LDL goal < 100) The 10-year ASCVD risk score (Arnett DK, et al., 2019) is: 28.3%   Values used to calculate the score:     Age: [age]years     Sex: Female     Is Non-Hispanic African American: Yes     Diabetic: Yes     Tobacco smoker: No     Systolic Blood Pressure: 1163mmHg     Is BP treated: Yes     HDL Cholesterol: 54 mg/dL     Total Cholesterol: 162 mg/dL Lab Results  Component Value Date   CHOL 162  04/08/2021   CHOL 162 12/25/2020   CHOL 171 09/20/2020   Lab Results  Component Value Date   HDL 54 04/08/2021   HDL 53 12/25/2020   HDL 58 09/20/2020   Lab Results  Component Value Date   LDLCALC 90 04/08/2021   LDLCALC 86 12/25/2020   LDLCALC 95 09/20/2020   Lab Results  Component Value Date   TRIG 101 04/08/2021   TRIG 129 12/25/2020   TRIG 102 09/20/2020   Lab Results  Component Value Date   CHOLHDL 3.0 04/08/2021   CHOLHDL 3.1 12/25/2020   CHOLHDL 2.9 09/20/2020  No results found for: LDLDIRECT -Controlled -Current  treatment: aspirin ec 81 mg daily Appropriate, Effective, Safe, Accessible Crestor (Brand) 10 mg twice weekly Appropriate, Effective, Safe, Accessible -Medications previously tried: brand name Crestor, atorvastatin -Current dietary patterns: reports breakfast meat and hot dogs  -Current exercise habits: was previously involved in karate and exercise class prior to [place] -Educated on Cholesterol goals;  Benefits of statin for ASCVD risk reduction; Importance of limiting foods high in cholesterol; Exercise goal of 150 minutes per week; -Recommended considering Crestor 10 mg three times weekly. Patient reports she can tolerate brand name Crestor but the generic leaves her achy. Pharmacist to request Dispense as Written for brand name Crestor.  Assessed patient finances. Patient approved for [employer] to cover cost of Crestor.   Diabetes (A1c goal <7%) Lab Results  Component Value Date   HGBA1C 6.8 (H) 04/08/2021   HGBA1C 6.8 (H) 12/25/2020   HGBA1C 6.7 (H) 09/20/2020   Lab Results  Component Value Date   MICROALBUR 10 12/25/2020   LDLCALC 90 04/08/2021   CREATININE 0.81 04/08/2021   Lab Results  Component Value Date   NA 144 04/08/2021   K 3.6 04/08/2021   CREATININE 0.81 04/08/2021   EGFR 76 04/08/2021   GFRNONAA >60 09/14/2008   GLUCOSE 124 (H) 04/08/2021   Lab Results  Component Value Date   WBC 4.2 04/08/2021   HGB 13.5 04/08/2021   HCT 41.3 04/08/2021   MCV 94 04/08/2021   PLT 296 04/08/2021  -Controlled -Current medications: Metformin xr 500 mg twice daily Appropriate, Effective, Safe, Accessible One touch verio test strips twice daily  Lancets daily prior to meal  -Medications previously tried: Metformin 524m 3/day (ADR's) -Current home glucose readings fasting glucose:  May 2022: 120, 121, 136, 156, 124, 128, 116, 128, 164 (birthday cake), 143, 168 (birthday cake) Jan 2023: Didn't have readings on her -Denies hypoglycemic/hyperglycemic  symptoms -Current meal patterns:  Late breakfast or early lunch: egg, bacon or sausage with grits or pancake occasionally (a few times a month). [place] ClapCroissant once a week as a treat.   dinner: dines out mainly.  snacks:  more birthday cake lately since celebrating all month long),  drinks: unsweet tea, coffee  -Current exercise: limited since not attending exercise class after COVID -Educated on A1c and blood sugar goals; Complications of diabetes including kidney damage, retinal damage, and cardiovascular disease; Exercise goal of 150 minutes per week; Benefits of routine self-monitoring of blood sugar; Carbohydrate counting and/or plate method -Counseled to check feet daily and get yearly eye exams -Counseled on diet and exercise extensively Recommended to continue current medication Jan 2023: Patient doesn't want to increase Metformin to 3/day nor does she want to start Ozempic. Updated medslist and will let PCP know     Disc Degeneration (Goal: manage pain) -Pain Scale  -Jan 2023: Patient didn't specify, only said she is very  content -Controlled -Current treatment  tramadol 50 mg bid prn  Meloxicam 75m QD PRN -Medications previously tried:N/A -Recommended to continue current medication   Patient Goals/Self-Care Activities Patient will:  - take medications as prescribed focus on medication adherence by taking medication as needed check glucose daily, document, and provide at future appointments check blood pressure daily, document, and provide at future appointments target a minimum of 150 minutes of moderate intensity exercise weekly engage in dietary modifications by limiting carbohydrates and fried/fatty foods. Encouraged lean protein, fruits and vegetables.   Follow Up Plan: Telephone follow up appointment with care management team member scheduled for: 06/2021      Medication Assistance: Application for Crestor grant funding  medication assistance program.  in process.  Anticipated assistance start date 11/28/2020.  See plan of care for additional detail.  [id number] [place] - [address] [place]Phone: [phone]: [phone] Uses pill box? No - stores together in cabinet and denies missed doses Pt endorses good compliance  We discussed: Benefits of medication synchronization, packaging and delivery as well as enhanced pharmacist oversight with [employer]. Patient decided to: Continue current medication management strategy  Care Plan and Follow Up Patient Decision:  Patient agrees to Care Plan and Follow-up.  Plan: Telephone follow up appointment with care management team member scheduled for:  06/2021

## 2021-07-05 ENCOUNTER — Telehealth: Payer: Self-pay

## 2021-07-05 NOTE — Progress Notes (Signed)
07/05/21- Pt confirmed appt with CPP on 07/08/21  Elray Mcgregor, [place] Clinical Pharmacist Assistant  [phone]

## 2021-07-08 ENCOUNTER — Ambulatory Visit: Payer: Medicare Other

## 2021-07-08 NOTE — Progress Notes (Signed)
Chronic Care Management Pharmacy Note  07/08/2021 Name:  Mindy Ingram MRN:  [id number] DOB:  01/16/1946   Summary: Very pleasant 76 year old female presents for f/u CCM visit. She just completed her BS in Theology at the age of 66. She was married at 33 and has 1 child with 5 grandchildren. In her free time, she loved taking Karate classes but when Covid started, she had to stop   Plan Updates:  Patient was going to be onboarded to [facility] today but wants to hold off   Subjective: Mindy Ingram is an 76 y.o. year old female who is a primary patient of Cox, Kirsten, MD.  The CCM team was consulted for assistance with disease management and care coordination needs.    Engaged with patient by telephone for follow up visit in response to provider referral for pharmacy case management and/or care coordination services.   Consent to Services:  The patient was given the following information about Chronic Care Management services today, agreed to services, and gave verbal consent: 1. CCM service includes personalized support from designated clinical staff supervised by the primary care provider, including individualized plan of care and coordination with other care providers 2. 24/7 contact phone numbers for assistance for urgent and routine care needs. 3. Service will only be billed when office clinical staff spend 20 minutes or more in a month to coordinate care. 4. Only one practitioner may furnish and bill the service in a calendar month. 5.The patient may stop CCM services at any time (effective at the end of the month) by phone call to the office staff. 6. The patient will be responsible for cost sharing (co-pay) of up to 20% of the service fee (after annual deductible is met). Patient agreed to services and consent obtained.  Patient Care Team: [name], MD as PCP - General (Family Medicine) Nehemiah Settle, MD (Gastroenterology) Hennie Duos, MD as Consulting Physician  (Rheumatology) Sharyn Dross., DPM (Podiatry) Lane Hacker, [facility] (Pharmacist)  Recent office visits:  09/18/2020: [name], MD (PCP) for HTN/ Discontinued Amlodipine, Carisoprodol, and Meloxicam. Completed course of Methocarbamol. Change in therapy: Discontinued Valsartan-Hydrochlorothiazide 160-12.5 mg.      Recent consult visits:  07/03/2020: [name], MD (Rheumatology) for joint pain / labs drawn, X-ray (R) elbow/ no medication changes noted    05/31/2020: [name], MD (Cardiology) for HTN/ EKG obtained / no medication changes noted      Hospital visits:  No hospital visits noted within the past 6 months   Objective:  Lab Results  Component Value Date   CREATININE 0.81 04/08/2021   BUN 11 04/08/2021   GFRNONAA >60 09/14/2008   GFRAA  09/14/2008    >60        The eGFR has been calculated using the MDRD equation. This calculation has not been validated in all clinical situations. eGFR's persistently <60 mL/min signify possible Chronic Kidney Disease.   NA 144 04/08/2021   K 3.6 04/08/2021   CALCIUM 9.5 04/08/2021   CO2 27 04/08/2021   GLUCOSE 124 (H) 04/08/2021    Lab Results  Component Value Date/Time   HGBA1C 6.8 (H) 04/08/2021 11:38 AM   HGBA1C 6.8 (H) 12/25/2020 10:58 AM   MICROALBUR 10 12/25/2020 10:17 AM    Last diabetic Eye exam:  Lab Results  Component Value Date/Time   HMDIABEYEEXA No Retinopathy 01/01/2021 12:00 AM    Last diabetic Foot exam: No results found for: HMDIABFOOTEX   Lab Results  Component Value Date   CHOL 162 04/08/2021   HDL 54 04/08/2021   LDLCALC 90 04/08/2021   TRIG 101 04/08/2021   CHOLHDL 3.0 04/08/2021    Hepatic Function Latest Ref Rng & Units 04/08/2021 12/25/2020 09/20/2020  Total Protein 6.0 - 8.5 g/dL 6.7 6.9 6.9  Albumin 3.7 - 4.7 g/dL 4.6 4.5 4.5  AST 0 - 40 IU/L 17 15 20   ALT 0 - 32 IU/L 12 14 10   Alk Phosphatase 44 - 121 IU/L 47 47 45  Total Bilirubin 0.0 - 1.2 mg/dL 0.3 0.4 0.3    Lab  Results  Component Value Date/Time   TSH 1.450 09/20/2020 10:21 AM    CBC Latest Ref Rng & Units 04/08/2021 12/25/2020 09/20/2020  WBC 3.4 - 10.8 x10E3/uL 4.2 5.4 4.6  Hemoglobin 11.1 - 15.9 g/dL 13.5 13.2 13.4  Hematocrit 34.0 - 46.6 % 41.3 40.8 40.0  Platelets 150 - 450 x10E3/uL 296 265 283    No results found for: VD25OH  Clinical ASCVD: No  The 10-year ASCVD risk score (Arnett DK, et al., 2019) is: 28.3%   Values used to calculate the score:     Age: 81 years     Sex: Female     Is Non-Hispanic African American: Yes     Diabetic: Yes     Tobacco smoker: No     Systolic Blood Pressure: 413 mmHg     Is BP treated: Yes     HDL Cholesterol: 54 mg/dL     Total Cholesterol: 162 mg/dL    Depression screen [facility] 2/9 05/14/2021 02/26/2021 12/25/2020  Decreased Interest 0 0 0  Down, Depressed, Hopeless 0 0 0  PHQ - 2 Score 0 0 0     Other: (CHADS2VASc if Afib, MMRC or CAT for COPD, ACT, DEXA)  Social History   Tobacco Use  Smoking Status Never  Smokeless Tobacco Never   BP Readings from Last 3 Encounters:  05/14/21 130/70  05/09/21 (!) 150/90  04/30/21 (!) 144/64   Pulse Readings from Last 3 Encounters:  05/14/21 73  05/09/21 82  04/30/21 67   Wt Readings from Last 3 Encounters:  05/14/21 144 lb 6.4 oz (65.5 kg)  04/30/21 145 lb 3.2 oz (65.9 kg)  04/23/21 142 lb (64.4 kg)   BMI Readings from Last 3 Encounters:  05/14/21 29.17 kg/m  04/30/21 29.33 kg/m  04/23/21 27.73 kg/m    Assessment/Interventions: Review of patient past medical history, allergies, medications, health status, including review of consultants reports, laboratory and other test data, was performed as part of comprehensive evaluation and provision of chronic care management services.   SDOH:  (Social Determinants of Health) assessments and interventions performed: Yes   SDOH Screenings   Alcohol Screen: Low Risk    Last Alcohol Screening Score (AUDIT): 0  Depression (PHQ2-9): Low Risk     PHQ-2 Score: 0  Financial Resource Strain: Low Risk    Difficulty of Paying Living Expenses: Not hard at all  Food Insecurity: No Food Insecurity   Worried About Charity fundraiser in the Last Year: Never true   Ran Out of Food in the Last Year: Never true  Housing: Low Risk    Last Housing Risk Score: 0  Physical Activity: Not on file  Social Connections: Not on file  Stress: Not on file  Tobacco Use: Low Risk    Smoking Tobacco Use: Never   Smokeless Tobacco Use: Never   Passive Exposure: Not on file  Transportation Needs:  No Transportation Needs   Lack of Transportation (Medical): No   Lack of Transportation (Non-Medical): No    CCM Care Plan  Allergies  Allergen Reactions   Dairy Aid [Tilactase]     Hives, diarrhea   Lactose Anaphylaxis and Other (See Comments)   Atorvastatin Other (See Comments)    Severe myalgias   Compazine [Prochlorperazine Edisylate]    Hydrocodone-Acetaminophen     REACTION: sore throat with vicodin   Hydrocodone-Acetaminophen Other (See Comments)    Other reaction(s): Other (See Comments) Sore throat , like it is closing up on her REACTION: sore throat with vicodin    Milk-Related Compounds Other (See Comments)   Mirabegron Other (See Comments)    Headache    Other Other (See Comments)    Other reaction(s): headache Other reaction(s): confused   Oxycontin [Oxycodone Hcl]     "makes me crazy"   Prochlorperazine Other (See Comments)   Prochlorperazine Edisylate     REACTION: stroke-like symptoms with companzine   Rosuvastatin     Can take brand crestor-severe myalgias   Shellfish Allergy    Shellfish-Derived Products Other (See Comments)   Umeclidinium-Vilanterol Other (See Comments)    Lactose allergy   Percodan [Oxycodone-Aspirin] Palpitations    Fast heartbeat with percodan    Medications Reviewed Today     Reviewed by Lane Hacker, [facility] (Pharmacist) on 07/03/21 at 1413  Med List Status: <None>   Medication Order Taking?  Sig Documenting Provider Last Dose Status Informant  albuterol (PROVENTIL HFA;VENTOLIN HFA) 108 (90 Base) MCG/ACT inhaler [id number]  Inhale 2 puffs every 4-6 hours as needed Baird Lyons D, MD  Active   amLODipine (NORVASC) 5 MG tablet [id number] Yes TAKE 1/2 TABLET BY MOUTH DAILY  Patient taking differently: Take 2.5 mg by mouth daily as needed.   [name], PA-C Taking Active   aspirin EC 81 MG tablet [id number]  Take 81 mg by mouth daily. [provider]  Active   azelastine (ASTELIN) 0.1 % nasal spray [id number]  Place 1 spray into the nose 2 (two) times daily as needed. [provider]  Expired 02/26/21 2359   BESIVANCE 0.6 % SUSP [id number]  Place 1 drop into the right eye 3 (three) times daily. [provider]  Active   cetirizine (ZYRTEC) 10 MG tablet [id number]  Take 1 tablet (10 mg total) by mouth daily as needed. [name], MD  Active   CHLOROPHYLL [place] [id number]  Apply 1 tablet topically daily. [provider]  Active   Cholecalciferol (VITAMIN D PO) [id number]  Take 2,000 Units by mouth daily.  [provider]  Active   CRESTOR 10 MG tablet [id number]  TAKE 1 TABLET BY MOUTH 3 TIMES A WEEK Cox, Kirsten, MD  Active   Difluprednate 0.05 % EMUL [id number]  Place 1 drop into the right eye 4 (four) times daily. [provider]  Active   Elderberry 575 MG/5ML SYRP [id number]  Take 5 mLs by mouth daily. [provider]  Active   fluticasone (FLONASE) 50 MCG/ACT nasal spray [id number]  1-2 puffs each nostril once daily Baird Lyons D, MD  Active   ibuprofen (ADVIL) 800 MG tablet [id number]  Take 1 tablet (800 mg total) by mouth every 8 (eight) hours as needed. Cox, Kirsten, MD  Active   Inulin (FIBER CHOICE PO) [id number]  Take 1 tablet by mouth daily as needed. [provider]  Active   Lancets (ONETOUCH DELICA PLUS NLZJQB34L) [place] [id number]  [place]  DAILY PRIOR TO MEAL [provider]  Active    losartan-hydrochlorothiazide (HYZAAR) 100-25 MG tablet [id number] Yes Take 1 tablet by mouth daily. Cox, Kirsten, MD Taking Active   metFORMIN (GLUCOPHAGE-XR) 500 MG 24 hr tablet [id number] Yes TAKE 2 TABLETS EVERY MORNING, AND 1 TABLET IN THE EVENING  Patient taking differently: Take 500 mg by mouth 2 (two) times daily.   Cox, Kirsten, MD Taking Active   metoprolol (TOPROL-XL) 200 MG 24 hr tablet [id number] Yes TAKE 1 TABLET BY MOUTH EVERY DAY Cox, Kirsten, MD Taking Active   Multiple Vitamins-Minerals (COMPLETE ENERGY) TABS [id number]  Take 1 tablet by mouth daily. [provider]  Active Self  nitroGLYCERIN (NITROSTAT) 0.4 MG SL tablet [id number]  Place 1 tablet (0.4 mg total) under the tongue every 5 (five) minutes as needed for chest pain. Revankar, Reita Cliche, MD  Active   Omega-3 Fatty Acids (FISH OIL) 1000 MG CAPS [id number]  Take 1 capsule by mouth daily. [provider]  Active   omeprazole (PRILOSEC) 20 MG capsule [id number]  Take 1 capsule (20 mg total) by mouth daily. [name], MD  Active   [facility] VERIO test strip [id number]  1 each by Other route daily. [provider]  Active   PROLENSA 0.07 % SOLN [id number]  SMARTSIG:1 Drop(s) Right Eye Every Evening [provider]  Active   Semaglutide,0.25 or 0.5MG/DOS, (OZEMPIC, 0.25 OR 0.5 MG/DOSE,) 2 MG/1.5ML SOPN [id number]  Inject 0.5 mg into the skin once a week. Cox, Kirsten, MD  Active   traMADol (ULTRAM) 50 MG tablet [id number]  Take 50 mg by mouth 2 (two) times daily as needed. [provider]  Active   triamcinolone (KENALOG) 0.025 % ointment [id number]  Apply 1 application topically 2 (two) times daily as needed. [provider]  Active   triamcinolone cream (KENALOG) 0.1 % [id number]  1 application [provider]  Active   valACYclovir (VALTREX) 500 MG tablet [id number]  1 tablet [provider]  Active   Med List Note Annamaria Boots, Tarri Fuller D, MD 03/09/11 1408): CPAP 12, reduced  to 10 as of 03/06/2011            Patient Active Problem List   Diagnosis Date Noted   Neck muscle spasm 04/26/2021   Fibromyalgia 04/10/2021   DM type 2 with diabetic mixed hyperlipidemia ([place]) 04/08/2021   Contusion of head, initial encounter 03/18/2021   Light headedness 03/18/2021   Alopecia 06/25/2020   Shoulder joint pain 06/25/2020   Costochondritis 06/25/2020   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   Corn of toe 06/25/2020   Cramp in lower leg associated with rest 06/25/2020   Eructation 06/25/2020   Hyperlipidemia 06/25/2020   Lateral epicondylitis 06/25/2020   Menopause 06/25/2020   Multiple benign melanocytic nevi 06/25/2020   Other long term (current) drug therapy 06/25/2020   Vitamin D deficiency 06/25/2020   Sleep apnea    OSA (obstructive sleep apnea)    Chronic back pain    DJD (degenerative joint disease)    Bilateral impacted cerumen 07/01/2018   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   Encounter for immunization 03/15/2018   Acquired hammer toe of right foot 08/11/2017   Upper airway cough syndrome 09/24/2015   Dyslipidemia 01/23/2015   Essential hypertension 01/23/2015   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   Degeneration of lumbosacral intervertebral disc 01/26/2012   Low back pain 01/26/2012   AODM 10/17/2007   Seasonal and perennial allergic rhinitis 10/17/2007   Allergic asthma,  mild intermittent, uncomplicated 50/02/3817   GERD 10/17/2007   Asthmatic bronchitis, mild intermittent, with acute exacerbation 10/11/2007    Immunization History  Administered Date(s) Administered   Fluad Quad(high Dose 65+) 04/08/2021   Hepatitis B, adult 07/01/2003   Influenza Split 03/12/2011, 03/30/2012, 04/01/2013, 04/04/2014, 04/03/2015, 04/02/2016, 03/30/2017, 04/14/2020   Influenza Whole 04/30/2009, 03/06/2011   Influenza, High Dose Seasonal PF 03/30/2017, 03/11/2018, 03/11/2018, 03/16/2019   Influenza,inj,Quad PF,6+ Mos 03/30/2013, 03/30/2014, 04/03/2015,  04/02/2016   Moderna Covid-19 Vaccine Bivalent Booster 70yr & up 04/08/2021   Moderna Sars-Covid-2 Vaccination 08/05/2019, 08/31/2019, 04/25/2020   Pneumococcal Conjugate-13 02/16/2014   Pneumococcal Polysaccharide-23 06/30/1996, 10/28/2016   Td 07/01/2003   Tdap 01/16/2012   Zoster, Live 07/27/2013, 05/09/2020, 08/12/2020    Conditions to be addressed/monitored:  Hypertension, Hyperlipidemia, Diabetes, GERD and vitamin D deficiency, obstructive sleep apnea.   Care Plan : CCM Pharmacy Care Plan  Updates made by KLane Hacker RPH since 07/08/2021 12:00 AM     Problem: dm,htn, hld   Priority: High  Onset Date: 11/22/2020     Long-Range Goal: Disease State Management   Start Date: 11/22/2020  Expected End Date: 11/22/2021  Recent Progress: On track  Priority: High  Note:   Current Barriers:  Unable to independently afford treatment regimen  Pharmacist Clinical Goal(s):  Patient will verbalize ability to afford treatment regimen through collaboration with PharmD and provider.   Interventions: 1:1 collaboration with Cox, Kirsten, MD regarding development and update of comprehensive plan of care as evidenced by provider attestation and co-signature Inter-disciplinary care team collaboration (see longitudinal plan of care) Comprehensive medication review performed; medication list updated in electronic medical record  Hypertension (BP goal <130/80) BP Readings from Last 3 Encounters:  05/14/21 130/70  05/09/21 (!) 150/90  04/30/21 (!) 144/64  -Controlled -Current treatment: amlodipine 2.5 mg daily Appropriate, Effective, Safe, Accessible Losartan-hydrochlorothiazide 100/25 mg daily Appropriate, Effective, Safe, Accessible Metoprolol succinate 200 mg daily Appropriate, Effective, Safe, Accessible -Medications previously tried: none reported  -Current home readings:  160/88 mmHg is highest ever with meloxicam. She knows goal is <130/80 mmHg. Most readings are well  controlled.  -Current dietary habits: eats breakfast and supper mainly.  -Current exercise habits: Was previously in karate and exercise class. She hopes to rejoin the class soon. [place]  -Denies hypotensive/hypertensive symptoms -Educated on BP goals and benefits of medications for prevention of heart attack, stroke and kidney damage; Daily salt intake goal < 2300 mg; Exercise goal of 150 minutes per week; Importance of home blood pressure monitoring; -Counseled to monitor BP at home daily, document, and provide log at future appointments -Counseled on diet and exercise extensively Recommended to continue current medication Recommended patient return to exercise class.   Hyperlipidemia: (LDL goal < 100) The 10-year ASCVD risk score (Arnett DK, et al., 2019) is: 28.3%   Values used to calculate the score:     Age: [age]years     Sex: Female     Is Non-Hispanic African American: Yes     Diabetic: Yes     Tobacco smoker: No     Systolic Blood Pressure: 1299mmHg     Is BP treated: Yes     HDL Cholesterol: 54 mg/dL     Total Cholesterol: 162 mg/dL Lab Results  Component Value Date   CHOL 162 04/08/2021   CHOL 162 12/25/2020   CHOL 171 09/20/2020   Lab Results  Component Value Date   HDL 54 04/08/2021   HDL 53  12/25/2020   HDL 58 09/20/2020   Lab Results  Component Value Date   LDLCALC 90 04/08/2021   LDLCALC 86 12/25/2020   LDLCALC 95 09/20/2020   Lab Results  Component Value Date   TRIG 101 04/08/2021   TRIG 129 12/25/2020   TRIG 102 09/20/2020   Lab Results  Component Value Date   CHOLHDL 3.0 04/08/2021   CHOLHDL 3.1 12/25/2020   CHOLHDL 2.9 09/20/2020  No results found for: LDLDIRECT -Controlled -Current treatment: aspirin ec 81 mg daily Appropriate, Effective, Safe, Accessible Crestor (Brand) 10 mg twice weekly Appropriate, Effective, Safe, Accessible -Medications previously tried: brand name Crestor, atorvastatin -Current dietary  patterns: reports breakfast meat and hot dogs  -Current exercise habits: was previously involved in karate and exercise class prior to [place] -Educated on Cholesterol goals;  Benefits of statin for ASCVD risk reduction; Importance of limiting foods high in cholesterol; Exercise goal of 150 minutes per week; -Recommended considering Crestor 10 mg three times weekly. Patient reports she can tolerate brand name Crestor but the generic leaves her achy. Pharmacist to request Dispense as Written for brand name Crestor.  Assessed patient finances. Patient approved for [employer] to cover cost of Crestor.   Diabetes (A1c goal <7%) Lab Results  Component Value Date   HGBA1C 6.8 (H) 04/08/2021   HGBA1C 6.8 (H) 12/25/2020   HGBA1C 6.7 (H) 09/20/2020   Lab Results  Component Value Date   MICROALBUR 10 12/25/2020   LDLCALC 90 04/08/2021   CREATININE 0.81 04/08/2021   Lab Results  Component Value Date   NA 144 04/08/2021   K 3.6 04/08/2021   CREATININE 0.81 04/08/2021   EGFR 76 04/08/2021   GFRNONAA >60 09/14/2008   GLUCOSE 124 (H) 04/08/2021   Lab Results  Component Value Date   WBC 4.2 04/08/2021   HGB 13.5 04/08/2021   HCT 41.3 04/08/2021   MCV 94 04/08/2021   PLT 296 04/08/2021  -Controlled -Current medications: Metformin xr 500 mg twice daily Appropriate, Effective, Safe, Accessible One touch verio test strips twice daily  Lancets daily prior to meal  -Medications previously tried: Metformin 575m 3/day (ADR's) -Current home glucose readings fasting glucose:  May 2022: 120, 121, 136, 156, 124, 128, 116, 128, 164 (birthday cake), 143, 168 (birthday cake) Jan 2023: Didn't have readings on her -Denies hypoglycemic/hyperglycemic symptoms -Current meal patterns:  Late breakfast or early lunch: egg, bacon or sausage with grits or pancake occasionally (a few times a month). [place] ClapCroissant once a week as a treat.   dinner: dines out mainly.  snacks:  more birthday cake  lately since celebrating all month long),  drinks: unsweet tea, coffee  -Current exercise: limited since not attending exercise class after COVID -Educated on A1c and blood sugar goals; Complications of diabetes including kidney damage, retinal damage, and cardiovascular disease; Exercise goal of 150 minutes per week; Benefits of routine self-monitoring of blood sugar; Carbohydrate counting and/or plate method -Counseled to check feet daily and get yearly eye exams -Counseled on diet and exercise extensively Recommended to continue current medication Jan 2023: Patient doesn't want to increase Metformin to 3/day nor does she want to start Ozempic. Updated medslist and will let PCP know     Disc Degeneration (Goal: manage pain) -Pain Scale  -Jan 2023: Patient didn't specify, only said she is very content -Controlled -Current treatment  tramadol 50 mg bid prn  Meloxicam 11mQD PRN -Medications previously tried:N/A -Recommended to continue current medication   Patient Goals/Self-Care Activities  Patient will:  - take medications as prescribed focus on medication adherence by taking medication as needed check glucose daily, document, and provide at future appointments check blood pressure daily, document, and provide at future appointments target a minimum of 150 minutes of moderate intensity exercise weekly engage in dietary modifications by limiting carbohydrates and fried/fatty foods. Encouraged lean protein, fruits and vegetables.   Follow Up Plan: Telephone follow up appointment with care management team member scheduled for: June 2023  [place] Constable, [place].D. - [phone]       Medication Assistance: Application for Crestor grant funding  medication assistance program. in process.  Anticipated assistance start date 11/28/2020.  See plan of care for additional detail.  Patient's preferred pharmacy is:  [id number], [place] - [address] AT [place] [address] [place] [address] Phone: [phone] Fax: [phone]  Uses pill box? No - stores together in cabinet and denies missed doses Pt endorses good compliance  We discussed: Benefits of medication synchronization, packaging and delivery as well as enhanced pharmacist oversight with [facility]. Patient decided to: Continue current medication management strategy  Care Plan and Follow Up Patient Decision:  Patient agrees to Care Plan and Follow-up.  Plan: Telephone follow up appointment with care management team member scheduled for:  June 2023  [place] Constable, [place].D. - [phone]

## 2021-07-08 NOTE — Patient Instructions (Signed)
Visit Information   Goals Addressed   None    Patient Care Plan: CCM Pharmacy Care Plan     Problem Identified: dm,htn, hld   Priority: High  Onset Date: 11/22/2020     Long-Range Goal: Disease State Management   Start Date: 11/22/2020  Expected End Date: 11/22/2021  Recent Progress: On track  Priority: High  Note:   Current Barriers:  Unable to independently afford treatment regimen  Pharmacist Clinical Goal(s):  Patient will verbalize ability to afford treatment regimen through collaboration with PharmD and provider.   Interventions: 1:1 collaboration with Cox, Kirsten, MD regarding development and update of comprehensive plan of care as evidenced by provider attestation and co-signature Inter-disciplinary care team collaboration (see longitudinal plan of care) Comprehensive medication review performed; medication list updated in electronic medical record  Hypertension (BP goal <130/80) BP Readings from Last 3 Encounters:  05/14/21 130/70  05/09/21 (!) 150/90  04/30/21 (!) 144/64  -Controlled -Current treatment: amlodipine 2.5 mg daily Appropriate, Effective, Safe, Accessible Losartan-hydrochlorothiazide 100/25 mg daily Appropriate, Effective, Safe, Accessible Metoprolol succinate 200 mg daily Appropriate, Effective, Safe, Accessible -Medications previously tried: none reported  -Current home readings:  160/88 mmHg is highest ever with meloxicam. She knows goal is <130/80 mmHg. Most readings are well controlled.  -Current dietary habits: eats breakfast and supper mainly.  -Current exercise habits: Was previously in karate and exercise class. She hopes to rejoin the class soon. Had stopped prior to [place].  -Denies hypotensive/hypertensive symptoms -Educated on BP goals and benefits of medications for prevention of heart attack, stroke and kidney damage; Daily salt intake goal < 2300 mg; Exercise goal of 150 minutes per week; Importance of home blood pressure  monitoring; -Counseled to monitor BP at home daily, document, and provide log at future appointments -Counseled on diet and exercise extensively Recommended to continue current medication Recommended patient return to exercise class.   Hyperlipidemia: (LDL goal < 100) The 10-year ASCVD risk score (Arnett DK, et al., 2019) is: 28.3%   Values used to calculate the score:     Age: 76 years     Sex: Female     Is Non-Hispanic African American: Yes     Diabetic: Yes     Tobacco smoker: No     Systolic Blood Pressure: 270 mmHg     Is BP treated: Yes     HDL Cholesterol: 54 mg/dL     Total Cholesterol: 162 mg/dL Lab Results  Component Value Date   CHOL 162 04/08/2021   CHOL 162 12/25/2020   CHOL 171 09/20/2020   Lab Results  Component Value Date   HDL 54 04/08/2021   HDL 53 12/25/2020   HDL 58 09/20/2020   Lab Results  Component Value Date   LDLCALC 90 04/08/2021   LDLCALC 86 12/25/2020   LDLCALC 95 09/20/2020   Lab Results  Component Value Date   TRIG 101 04/08/2021   TRIG 129 12/25/2020   TRIG 102 09/20/2020   Lab Results  Component Value Date   CHOLHDL 3.0 04/08/2021   CHOLHDL 3.1 12/25/2020   CHOLHDL 2.9 09/20/2020  No results found for: LDLDIRECT -Controlled -Current treatment: aspirin ec 81 mg daily Appropriate, Effective, Safe, Accessible Crestor (Brand) 10 mg twice weekly Appropriate, Effective, Safe, Accessible -Medications previously tried: brand name Crestor, atorvastatin -Current dietary patterns: reports breakfast meat and hot dogs  -Current exercise habits: was previously involved in karate and exercise class prior to [place] on Cholesterol goals;  Benefits of statin for ASCVD  risk reduction; Importance of limiting foods high in cholesterol; Exercise goal of 150 minutes per week; -Recommended considering Crestor 10 mg three times weekly. Patient reports she can tolerate brand name Crestor but the generic leaves her achy. Pharmacist to request  Dispense as Written for brand name Crestor.  Assessed patient finances. Patient approved for [employer] to cover cost of Crestor.   Diabetes (A1c goal <7%) Lab Results  Component Value Date   HGBA1C 6.8 (H) 04/08/2021   HGBA1C 6.8 (H) 12/25/2020   HGBA1C 6.7 (H) 09/20/2020   Lab Results  Component Value Date   MICROALBUR 10 12/25/2020   LDLCALC 90 04/08/2021   CREATININE 0.81 04/08/2021   Lab Results  Component Value Date   NA 144 04/08/2021   K 3.6 04/08/2021   CREATININE 0.81 04/08/2021   EGFR 76 04/08/2021   GFRNONAA >60 09/14/2008   GLUCOSE 124 (H) 04/08/2021   Lab Results  Component Value Date   WBC 4.2 04/08/2021   HGB 13.5 04/08/2021   HCT 41.3 04/08/2021   MCV 94 04/08/2021   PLT 296 04/08/2021  -Controlled -Current medications: Metformin xr 500 mg twice daily Appropriate, Effective, Safe, Accessible One touch verio test strips twice daily  Lancets daily prior to meal  -Medications previously tried: Metformin 531m 3/day (ADR's) -Current home glucose readings fasting glucose:  May 2022: 120, 121, 136, 156, 124, 128, 116, 128, 164 (birthday cake), 143, 168 (birthday cake) Jan 2023: Didn't have readings on her -Denies hypoglycemic/hyperglycemic symptoms -Current meal patterns:  Late breakfast or early lunch: egg, bacon or sausage with grits or pancake occasionally (a few times a month). [place] ClapCroissant once a week as a treat.   dinner: dines out mainly.  snacks:  more birthday cake lately since celebrating all month long),  drinks: unsweet tea, coffee  -Current exercise: limited since not attending exercise class after COVID -Educated on A1c and blood sugar goals; Complications of diabetes including kidney damage, retinal damage, and cardiovascular disease; Exercise goal of 150 minutes per week; Benefits of routine self-monitoring of blood sugar; Carbohydrate counting and/or plate method -Counseled to check feet daily and get yearly eye  exams -Counseled on diet and exercise extensively Recommended to continue current medication Jan 2023: Patient doesn't want to increase Metformin to 3/day nor does she want to start Ozempic. Updated medslist and will let PCP know     Disc Degeneration (Goal: manage pain) -Pain Scale  -Jan 2023: Patient didn't specify, only said she is very content -Controlled -Current treatment  tramadol 50 mg bid prn  Meloxicam 128mQD PRN -Medications previously tried:N/A -Recommended to continue current medication   Patient Goals/Self-Care Activities Patient will:  - take medications as prescribed focus on medication adherence by taking medication as needed check glucose daily, document, and provide at future appointments check blood pressure daily, document, and provide at future appointments target a minimum of 150 minutes of moderate intensity exercise weekly engage in dietary modifications by limiting carbohydrates and fried/fatty foods. Encouraged lean protein, fruits and vegetables.   Follow Up Plan: Telephone follow up appointment with care management team member scheduled for: June 2023  [place]. - [phone]       The patient verbalized understanding of instructions, educational materials, and care plan provided today and declined offer to receive copy of patient instructions, educational materials, and care plan.  The pharmacy team will reach out to the patient again over the next 90 days.   NaLane HackerRPBlack Hills Regional Eye Surgery Center LLC

## 2021-07-09 DIAGNOSIS — M150 Primary generalized (osteo)arthritis: Secondary | ICD-10-CM | POA: Diagnosis not present

## 2021-07-09 DIAGNOSIS — M5136 Other intervertebral disc degeneration, lumbar region: Secondary | ICD-10-CM | POA: Diagnosis not present

## 2021-07-09 DIAGNOSIS — R768 Other specified abnormal immunological findings in serum: Secondary | ICD-10-CM | POA: Diagnosis not present

## 2021-07-09 DIAGNOSIS — M797 Fibromyalgia: Secondary | ICD-10-CM | POA: Diagnosis not present

## 2021-07-10 ENCOUNTER — Other Ambulatory Visit: Payer: Self-pay

## 2021-07-10 DIAGNOSIS — J45909 Unspecified asthma, uncomplicated: Secondary | ICD-10-CM | POA: Insufficient documentation

## 2021-07-10 DIAGNOSIS — Z6830 Body mass index (BMI) 30.0-30.9, adult: Secondary | ICD-10-CM | POA: Insufficient documentation

## 2021-07-10 DIAGNOSIS — E669 Obesity, unspecified: Secondary | ICD-10-CM | POA: Insufficient documentation

## 2021-07-10 HISTORY — DX: Body mass index (BMI) 30.0-30.9, adult: Z68.30

## 2021-07-10 HISTORY — DX: Obesity, unspecified: E66.9

## 2021-07-13 NOTE — Assessment & Plan Note (Signed)
Flonase remains helpful.  Plan- discussed nasal steroids, antihistamines, Sudafed

## 2021-07-13 NOTE — Assessment & Plan Note (Signed)
Benefits from CPAP with good compliance and control. Plan- continue auto 5-17

## 2021-07-13 NOTE — Assessment & Plan Note (Signed)
Has been quiet, not needing rescue inhaler.

## 2021-07-16 ENCOUNTER — Encounter: Payer: Self-pay | Admitting: Cardiology

## 2021-07-16 ENCOUNTER — Other Ambulatory Visit: Payer: Self-pay

## 2021-07-16 ENCOUNTER — Ambulatory Visit: Payer: BC Managed Care – PPO | Admitting: Family Medicine

## 2021-07-16 ENCOUNTER — Ambulatory Visit (INDEPENDENT_AMBULATORY_CARE_PROVIDER_SITE_OTHER): Payer: Medicare Other | Admitting: Cardiology

## 2021-07-16 VITALS — BP 120/60 | HR 75 | Ht 59.6 in | Wt 146.4 lb

## 2021-07-16 DIAGNOSIS — I10 Essential (primary) hypertension: Secondary | ICD-10-CM | POA: Diagnosis not present

## 2021-07-16 DIAGNOSIS — E1169 Type 2 diabetes mellitus with other specified complication: Secondary | ICD-10-CM | POA: Diagnosis not present

## 2021-07-16 DIAGNOSIS — E782 Mixed hyperlipidemia: Secondary | ICD-10-CM | POA: Diagnosis not present

## 2021-07-16 NOTE — Progress Notes (Signed)
Cardiology Office Note:    Date:  07/16/2021   ID:  Mindy Ingram, DOB July 26, 1945, MRN [id number]  PCP:  [name], MD  Cardiologist:  Jenean Lindau, MD   Referring MD: [name], MD    ASSESSMENT:    1. Essential hypertension   2. Mixed hyperlipidemia   3. DM type 2 with diabetic mixed hyperlipidemia (HCC)    PLAN:    In order of problems listed above:  Primary prevention stressed with the patient.  Importance of compliance with diet medication stressed and she vocalized understanding. Essential hypertension: Blood pressure stable and diet was emphasized lifestyle modification urged.  She was advised to walk at least half an hour a day 5 days a week and she promises to do so. Mixed dyslipidemia: Lipids were reviewed and they were done in October and they are fine. Diabetes mellitus: Hemoglobin A1c continues to be elevated and a question of this and she understands.  She is doing better with medicines diet and exercise.  I told her to upper exercise and that it will help her diabetes too. Patient will be seen in follow-up appointment in 12 months or earlier if the patient has any concerns    Medication Adjustments/Labs and Tests Ordered: Current medicines are reviewed at length with the patient today.  Concerns regarding medicines are outlined above.  No orders of the defined types were placed in this encounter.  No orders of the defined types were placed in this encounter.    No chief complaint on file.    History of Present Illness:    Mindy Ingram is a 76 y.o. female.  Patient has past medical history of essential hypertension, dyslipidemia and diabetes mellitus.  She walks some on a regular basis.  He tells me that since [place] pandemic she is leading a sedentary lifestyle.  She is to go to the gym previously.  No chest pain orthopnea or PND.  At the time of my evaluation, the patient is alert awake oriented and in no distress.  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   Chronic back pain    Contusion of head, initial encounter 03/18/2021   [place] of toe 06/25/2020   Costochondritis 06/25/2020   Cramp in lower leg associated with rest 06/25/2020   Degeneration of lumbosacral intervertebral disc 01/26/2012   DJD (degenerative joint disease)    DM type 2 with diabetic mixed hyperlipidemia ([place]) 04/08/2021   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   Hyperlipidemia 06/25/2020   Lateral epicondylitis 06/25/2020   Light headedness 03/18/2021   Low back pain 01/26/2012   Menopause 06/25/2020   Multiple benign melanocytic nevi 06/25/2020   Neck muscle spasm 04/26/2021  Obesity 07/10/2021   OSA (obstructive sleep apnea)    cpap setting of 10   Other long term (current) drug therapy 06/25/2020   Seasonal and perennial allergic rhinitis 10/17/2007   Allergy vaccine 2002- dc'd   Shoulder joint pain 06/25/2020   Sleep apnea    Type II or unspecified type diabetes mellitus without mention of complication, not stated as uncontrolled    Upper airway cough syndrome 09/24/2015   Vitamin D deficiency 06/25/2020    Past Surgical History:  Procedure Laterality Date    [place]  2013   lower back with plates and screws   benign lump right axilla     [place]   at [place], slight limitation with turning neck   CHOLECYSTECTOMY  1995   EUS N/A 07/21/2013   Procedure: UPPER ENDOSCOPIC ULTRASOUND (EUS) LINEAR;  Surgeon: Milus Banister, MD;  Location: [facility];  Service: Endoscopy;  Laterality: N/A;   FOOT SURGERY     2007 - right great toe, 2000 - right fifth digit .   NEUROPLASTY / TRANSPOSITION MEDIAN NERVE AT [place]   PARTIAL COLECTOMY  1995   benign adhesions   PARTIAL HYSTERECTOMY  1980s.   abdominal   right foot surgery  2000   right great toe with artificial bone inserted   right knee arthroscopy Right 02/2017    Current Medications: Current Meds  Medication Sig   albuterol (VENTOLIN HFA) 108 (90 Base) MCG/ACT inhaler Inhale 2 puffs into the lungs every 6 (six) hours as needed for wheezing or shortness of breath.   amLODipine (NORVASC) 5 MG tablet Take 2.5 mg by mouth daily as needed for high blood pressure.   aspirin EC 81 MG tablet Take 81 mg by mouth daily.   azelastine (ASTELIN) 0.1 % nasal spray Place 1 spray into both nostrils 2 (two) times daily as needed for allergies or rhinitis.   cetirizine (ZYRTEC) 10 MG tablet Take 10 mg by mouth daily as needed for allergies.   Cholecalciferol (VITAMIN D PO) Take 2,000 Units by mouth daily.    CRESTOR 10 MG tablet TAKE 1 TABLET BY MOUTH 3 TIMES A WEEK   Elderberry 575 MG/5ML SYRP Take 5 mLs by mouth daily.   fluticasone (FLONASE) 50 MCG/ACT nasal spray Place 1-2 sprays into both nostrils daily.   ibuprofen (ADVIL) 800 MG tablet Take 800 mg by mouth every 8 (eight) hours as needed for headache.   losartan-hydrochlorothiazide (HYZAAR) 100-25 MG tablet Take 1 tablet by mouth daily.   metFORMIN (GLUCOPHAGE-XR) 500 MG 24 hr tablet Take 500 mg by mouth 2 (two) times daily.   metoprolol  (TOPROL-XL) 200 MG 24 hr tablet TAKE 1 TABLET BY MOUTH EVERY DAY   Multiple Vitamins-Minerals (COMPLETE ENERGY) TABS Take 1 tablet by mouth daily.   nitroGLYCERIN (NITROSTAT) 0.4 MG SL tablet Place 1 tablet (0.4 mg total) under the tongue every 5 (five) minutes as needed for chest pain.   Omega-3 Fatty Acids (FISH OIL) 1000 MG CAPS Take 1 capsule by mouth daily.   omeprazole (PRILOSEC) 20 MG capsule Take 1 capsule (20 mg total) by mouth daily.   PROLENSA 0.07 % SOLN Place 1 drop into the right eye every evening.   traMADol (ULTRAM) 50 MG tablet Take 50 mg by mouth 2 (two) times daily as needed for pain.     Allergies:   Dairy aid [tilactase], Lactose, Atorvastatin,  Hydrocodone-acetaminophen, Hydrocodone-acetaminophen, Milk-related compounds, Mirabegron, Oxycontin [oxycodone hcl], Prochlorperazine edisylate, Rosuvastatin, Shellfish allergy, Umeclidinium-vilanterol, and Percodan [oxycodone-aspirin]   Social History   Socioeconomic History   Marital status: Widowed    Spouse name: Not on file   Number of children: 1   Years of education: Not on file   Highest education level: Not on file  Occupational History   Occupation: Retired  Tobacco Use   Smoking status: Never   Smokeless tobacco: Never  Vaping Use   Vaping Use: Never used  Substance and Sexual Activity   Alcohol use: No   Drug use: No   Sexual activity: Not on file  Other Topics Concern   Not on file  Social History Narrative   Not on file   Social Determinants of Health   Financial Resource Strain: Low Risk    Difficulty of Paying Living Expenses: Not hard at all  Food Insecurity: No Food Insecurity   Worried About Charity fundraiser in the Last Year: Never true   [place] in the Last Year: Never true  Transportation Needs: No Transportation Needs   Lack of Transportation (Medical): No   Lack of Transportation (Non-Medical): No  Physical Activity: Not on file  Stress: Not on file  Social Connections: Not  on file     Family History: The patient's family history includes Breast cancer in her maternal aunt; Heart attack in her sister; Heart disease in her mother; Prostate cancer in her father and paternal uncle. There is no history of Allergies, Asthma, Eczema, or Immunodeficiency.  ROS:   Please see the history of present illness.    All other systems reviewed and are negative.  EKGs/Labs/Other Studies Reviewed:    The following studies were reviewed today: I discussed my findings with the patient at length.  EKG reveals sinus rhythm and nonspecific ST-T changes.   Recent Labs: 09/20/2020: TSH 1.450 12/25/2020: Magnesium 1.8 04/08/2021: ALT 12; BUN 11; Creatinine, Ser 0.81; Hemoglobin 13.5; Platelets 296; Potassium 3.6; Sodium 144  Recent Lipid Panel    Component Value Date/Time   CHOL 162 04/08/2021 1138   TRIG 101 04/08/2021 1138   HDL 54 04/08/2021 1138   CHOLHDL 3.0 04/08/2021 1138   LDLCALC 90 04/08/2021 1138    Physical Exam:    VS:  BP 120/60    Pulse 75    Ht 4' 11.6" (1.514 m)    Wt 146 lb 6.4 oz (66.4 kg)    SpO2 96%    BMI 28.98 kg/m     Wt Readings from Last 3 Encounters:  07/16/21 146 lb 6.4 oz (66.4 kg)  05/14/21 144 lb 6.4 oz (65.5 kg)  04/30/21 145 lb 3.2 oz (65.9 kg)     GEN: Patient is in no acute distress HEENT: Normal NECK: No JVD; No carotid bruits LYMPHATICS: No lymphadenopathy CARDIAC: Hear sounds regular, 2/6 systolic murmur at the apex. RESPIRATORY:  Clear to auscultation without rales, wheezing or rhonchi  ABDOMEN: Soft, non-tender, non-distended MUSCULOSKELETAL:  No edema; No deformity  SKIN: Warm and dry NEUROLOGIC:  Alert and oriented x 3 PSYCHIATRIC:  Normal affect   Signed, Jenean Lindau, MD  07/16/2021 1:18 PM    [facility]

## 2021-07-16 NOTE — Patient Instructions (Signed)
Medication Instructions:  Your physician recommends that you continue on your current medications as directed. Please refer to the Current Medication list given to you today.  *If you need a refill on your cardiac medications before your next appointment, please call your pharmacy*   Lab Work: None If you have labs (blood work) drawn today and your tests are completely normal, you will receive your results only by: [place] (if you have MyChart) OR A paper copy in the mail If you have any lab test that is abnormal or we need to change your treatment, we will call you to review the results.   Testing/Procedures: None   Follow-Up: At [facility], you and your health needs are our priority.  As part of our continuing mission to provide you with exceptional heart care, we have created designated Provider Care Teams.  These Care Teams include your primary Cardiologist (physician) and Advanced Practice Providers (APPs -  Physician Assistants and Nurse Practitioners) who all work together to provide you with the care you need, when you need it.  We recommend signing up for the patient portal called "MyChart".  Sign up information is provided on this After Visit Summary.  MyChart is used to connect with patients for Virtual Visits (Telemedicine).  Patients are able to view lab/test results, encounter notes, upcoming appointments, etc.  Non-urgent messages can be sent to your provider as well.   To learn more about what you can do with MyChart, go to [email or web address].    Your next appointment:   1 year(s)  The format for your next appointment:   In Person  Provider:   [name], MD    Other Instructions

## 2021-07-25 ENCOUNTER — Other Ambulatory Visit: Payer: Self-pay

## 2021-07-25 ENCOUNTER — Other Ambulatory Visit: Payer: Self-pay | Admitting: Family Medicine

## 2021-07-25 ENCOUNTER — Other Ambulatory Visit: Payer: Self-pay | Admitting: Physician Assistant

## 2021-07-25 MED ORDER — AMLODIPINE BESYLATE 5 MG PO TABS: 2.5000 mg | ORAL_TABLET | Freq: Every day | ORAL | 0 refills | Status: DC | PRN

## 2021-07-25 MED ORDER — CRESTOR 10 MG PO TABS: ORAL_TABLET | ORAL | 0 refills | Status: DC

## 2021-07-25 NOTE — Telephone Encounter (Signed)
Also requesting acyclovir 400 mg, not on current med list.   Did question if amlodipine could be changed to 2.5 mg one whole tablet instead of 5 mg 1/2 tablet.   Harrell Lark 07/25/21 2:43 PM

## 2021-07-29 ENCOUNTER — Other Ambulatory Visit: Payer: Self-pay | Admitting: Family Medicine

## 2021-07-29 MED ORDER — METFORMIN HCL ER 500 MG PO TB24
500.0000 mg | ORAL_TABLET | Freq: Two times a day (BID) | ORAL | 0 refills | Status: DC
Start: 2021-07-29 — End: 2021-08-13

## 2021-07-30 DIAGNOSIS — E782 Mixed hyperlipidemia: Secondary | ICD-10-CM

## 2021-07-30 DIAGNOSIS — I10 Essential (primary) hypertension: Secondary | ICD-10-CM

## 2021-07-30 DIAGNOSIS — E1169 Type 2 diabetes mellitus with other specified complication: Secondary | ICD-10-CM

## 2021-08-05 NOTE — Progress Notes (Signed)
Subjective:  Patient ID: Mindy Ingram, female    DOB: Nov 01, 1945  Age: 76 y.o. MRN: [id number]  Chief Complaint  Patient presents with   Diabetes   Hypertension   Hyperlipidemia    Diabetes:  Complications: hperlipidemia, hypertension. Glucose checking: 5 times per day Glucose logs: avg 140. 80 - 190s. Hypoglycemia:drops to 15s.  Most recent A1C: 6.8 Current medications: Metformin 500mg  take 1 tablet two times daily. Foot checks: daily. No sores.   Hyperlipidemia: Current medications: Rosuvastatin 10mg  take 1 tablet 3 times a week (she has been out of medication for 3 weeks, Fish Oil 1000mg  take 1 capsule twice daily.)  Hypertension: Current medications: Amlodipine 5mg  take 1/2 tablet daily, Losartan/HYDROCHLOROTHIAZIDE 100-25mg  take 1 tablet by mouth daily, Metoprolol 200mg  1 tablet daily.  Obstructive Sleep Apnea: Wears CPAP. Helps significantly.  GERD: on omeprazole 20 mg daily.  Current medications: Omeprazole 20mg  take 1 capsule daily.  Diet: healthy Exercise: active.    Patient is complaining of pain she has a radiating sharp pain that occurs usually at nighttime that extends from her right foot up to her knee.  She also has some some knee discomfort at times.  She has some right deltoid pain which she says has happened since her last vaccination.  It comes and goes.  In addition she has some right lateral epicondylitis.  I gave her exercises and she has a brace which she uses at times.  She says it flares up.  I have offered her a Kenalog injection but she prefers to wait.  In addition she has ibuprofen which she does take occasionally but she is concerned it raises her blood pressure.  When she does take it it does help with her right lower leg.  Patient is requesting acyclovir.  Patient says she has a rash that comes both is a cold sore on her lip as well as on her left buttock at times.  Patient says she seen dermatology in the past.  They actually apparently  diagnosed as nummular eczema/dermatitis.  In addition they diagnosed a seborrheic keratosis on her right buttock.  She is concerned that may be getting a little bit bigger.  Patient was given triamcinolone cream as well that helped this nummular eczema.  It is unclear as to whether or not the patient's rash on her left buttock which is not there now responded to the acyclovir with triamcinolone cream.   Current Outpatient Medications on File Prior to Visit  Medication Sig Dispense Refill   albuterol (VENTOLIN HFA) 108 (90 Base) MCG/ACT inhaler Inhale 2 puffs into the lungs every 6 (six) hours as needed for wheezing or shortness of breath.     amLODipine (NORVASC) 5 MG tablet Take 0.5 tablets (2.5 mg total) by mouth daily as needed. 90 tablet 0   aspirin EC 81 MG tablet Take 81 mg by mouth daily.     azelastine (ASTELIN) 0.1 % nasal spray Place 1 spray into both nostrils 2 (two) times daily as needed for allergies or rhinitis.     cetirizine (ZYRTEC) 10 MG tablet Take 10 mg by mouth daily as needed for allergies.     Cholecalciferol (VITAMIN D PO) Take 2,000 Units by mouth daily.      CRESTOR 10 MG tablet TAKE 1 TABLET BY MOUTH 3 TIMES A WEEK 36 tablet 0   Elderberry 575 MG/5ML SYRP Take 5 mLs by mouth daily.     fluticasone (FLONASE) 50 MCG/ACT nasal spray Place 1-2 sprays into both  nostrils daily.     ibuprofen (ADVIL) 800 MG tablet Take 800 mg by mouth every 8 (eight) hours as needed for headache.     losartan-hydrochlorothiazide (HYZAAR) 100-25 MG tablet Take 1 tablet by mouth daily. 90 tablet 1   metFORMIN (GLUCOPHAGE-XR) 500 MG 24 hr tablet Take 1 tablet (500 mg total) by mouth 2 (two) times daily. 180 tablet 0   metoprolol (TOPROL-XL) 200 MG 24 hr tablet TAKE 1 TABLET BY MOUTH EVERY DAY 90 tablet 1   Multiple Vitamins-Minerals (COMPLETE ENERGY) TABS Take 1 tablet by mouth daily.     nitroGLYCERIN (NITROSTAT) 0.4 MG SL tablet Place 1 tablet (0.4 mg total) under the tongue every 5 (five)  minutes as needed for chest pain. 30 tablet 4   Omega-3 Fatty Acids (FISH OIL) 1000 MG CAPS Take 1 capsule by mouth daily.     omeprazole (PRILOSEC) 20 MG capsule TAKE 1 CAPSULE(20 MG) BY MOUTH DAILY 90 capsule 1   PROLENSA 0.07 % SOLN Place 1 drop into the right eye every evening.     traMADol (ULTRAM) 50 MG tablet Take 50 mg by mouth 2 (two) times daily as needed for pain.     No current facility-administered medications on file prior to visit.   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   Chronic back pain    Contusion of head, initial encounter 03/18/2021   Corn of toe 06/25/2020   Costochondritis 06/25/2020   Cramp in lower leg associated with rest 06/25/2020   Degeneration of lumbosacral intervertebral disc 01/26/2012   DJD (degenerative joint disease)    DM type 2 with diabetic mixed hyperlipidemia ([place]) 04/08/2021   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   Hyperlipidemia 06/25/2020   Lateral epicondylitis 06/25/2020   Light headedness 03/18/2021   Low back pain 01/26/2012   Menopause 06/25/2020   Multiple benign melanocytic nevi 06/25/2020   Neck muscle spasm 04/26/2021   Obesity 07/10/2021   OSA  (obstructive sleep apnea)    cpap setting of 10   Other long term (current) drug therapy 06/25/2020   Seasonal and perennial allergic rhinitis 10/17/2007   Allergy vaccine 2002- dc'd   Shoulder joint pain 06/25/2020   Sleep apnea    Type II or unspecified type diabetes mellitus without mention of complication, not stated as uncontrolled    Upper airway cough syndrome 09/24/2015   Vitamin D deficiency 06/25/2020   Past Surgical History:  Procedure Laterality Date   [place]  2013   lower back with plates and screws   benign lump right axilla     BILATERAL SALPINGOOPHORECTOMY  [place]   at [place],  slight limitation with turning neck   CHOLECYSTECTOMY  1995   EUS N/A 07/21/2013   Procedure: UPPER ENDOSCOPIC ULTRASOUND (EUS) LINEAR;  Surgeon: Milus Banister, MD;  Location: [facility];  Service: Endoscopy;  Laterality: N/A;   FOOT SURGERY     2007 - right great toe, 2000 - right fifth digit .   NEUROPLASTY / TRANSPOSITION MEDIAN NERVE AT [place]   PARTIAL COLECTOMY  1995   benign adhesions   PARTIAL HYSTERECTOMY  1980s.   abdominal   right foot surgery  2000   right great toe with artificial bone inserted   right knee arthroscopy Right 02/2017    Family History  Problem Relation Age of Onset   Heart disease Mother    Prostate cancer Father    Heart attack Sister    Breast cancer Maternal Aunt    Prostate cancer Paternal Uncle    Allergies Neg Hx    Asthma Neg Hx    Eczema Neg Hx    Immunodeficiency Neg Hx    Social History   Socioeconomic History   Marital status: Widowed    Spouse name: Not on file   Number of children: 1   Years of education: Not on file   Highest education level: Not on file  Occupational History   Occupation: Retired  Tobacco Use   Smoking status: Never   Smokeless tobacco: Never  Vaping Use   Vaping Use: Never used  Substance and Sexual Activity   Alcohol use: No    Drug use: No   Sexual activity: Not on file  Other Topics Concern   Not on file  Social History Narrative   Not on file   Social Determinants of Health   Financial Resource Strain: Low Risk    Difficulty of Paying Living Expenses: Not hard at all  Food Insecurity: No Food Insecurity   Worried About Charity fundraiser in the Last Year: Never true   [place] in the Last Year: Never true  Transportation Needs: No Transportation Needs   Lack of Transportation (Medical): No   Lack of Transportation (Non-Medical): No  Physical Activity: Not on file  Stress: Not on file  Social Connections: Not on file    Review of Systems  Constitutional:  Negative for appetite change, fatigue and fever.  HENT:  Negative for congestion, ear pain, sinus pressure and sore throat.   Eyes:  Negative for pain.  Respiratory:  Negative for cough, chest tightness, shortness of breath and wheezing.   Cardiovascular:  Negative for chest pain and palpitations.  Gastrointestinal:  Negative for abdominal pain, constipation, diarrhea, nausea and vomiting.  Genitourinary:  Negative for dysuria and hematuria.  Musculoskeletal:  Positive for arthralgias, back pain and myalgias. Negative for joint swelling.  Skin:  Negative for rash.  Neurological:  Negative for dizziness, weakness and headaches.  Psychiatric/Behavioral:  Negative for dysphoric mood. The patient is not nervous/anxious.     Objective:  BP 120/60    Pulse 80    Temp (!) 97 F (36.1 C)    Resp 16    Ht 4\' 9"  (1.448 m)    Wt 146 lb (66.2 kg)    BMI 31.59 kg/m   BP/Weight 08/06/2021 07/16/2021 96/09/5407  Systolic BP 811 914 782  Diastolic BP 60 60 70  Wt. (Lbs) 146 146.4 144.4  BMI 31.59 28.98 29.17    Physical Exam Vitals reviewed.  Constitutional:      Appearance:  Normal appearance. She is normal weight.  Neck:     Vascular: No carotid bruit.  Cardiovascular:     Rate and Rhythm: Normal rate and regular rhythm.     Heart sounds:  Normal heart sounds.  Pulmonary:     Effort: Pulmonary effort is normal. No respiratory distress.     Breath sounds: Normal breath sounds.  Abdominal:     General: Abdomen is flat. Bowel sounds are normal.     Palpations: Abdomen is soft.     Tenderness: There is no abdominal tenderness.  Musculoskeletal:        General: Tenderness (Right lateral epicondyle.  Nontender over right lumbar back.  Negative straight leg raise.  Negative tenderness over right knee.  Negative tenderness over anterior right shin which is the area which she has sharp pain at night.) present.  Skin:    Findings: Lesion (1 cm seborrheic keratosis on right buttock.) present. No rash.  Neurological:     Mental Status: She is alert and oriented to person, place, and time.  Psychiatric:        Mood and Affect: Mood normal.        Behavior: Behavior normal.    Diabetic Foot Exam - Simple   Simple Foot Form Diabetic Foot exam was performed with the following findings: Yes 08/06/2021  5:54 PM  Visual Inspection See comments: Yes Sensation Testing Intact to touch and monofilament testing bilaterally: Yes Pulse Check Posterior Tibialis and Dorsalis pulse intact bilaterally: Yes Comments Hammer toes BL toes.      Lab Results  Component Value Date   WBC 4.5 08/06/2021   HGB 13.2 08/06/2021   HCT 38.9 08/06/2021   PLT 321 08/06/2021   GLUCOSE 124 (H) 08/06/2021   CHOL 222 (H) 08/06/2021   TRIG 160 (H) 08/06/2021   HDL 52 08/06/2021   LDLCALC 141 (H) 08/06/2021   ALT 12 08/06/2021   AST 17 08/06/2021   NA 140 08/06/2021   K 4.0 08/06/2021   CL 98 08/06/2021   CREATININE 0.97 08/06/2021   BUN 14 08/06/2021   CO2 25 08/06/2021   TSH 1.450 09/20/2020   HGBA1C 6.9 (H) 08/06/2021   MICROALBUR 10 12/25/2020      Assessment & Plan:   Problem List Items Addressed This Visit       Cardiovascular and Mediastinum   Hypertension associated with diabetes ([place])    The current medical regimen is effective;   continue present plan and medications. Continue Amlodipine 5mg  take 1/2 tablet daily, Losartan/HYDROCHLOROTHIAZIDE 100-25mg  take 1 tablet by mouth daily, Metoprolol 200mg  1 tablet daily. Check labs.         Respiratory   RESOLVED: Sleep apnea    Continue cpap. Compliant and beneficial.      OSA (obstructive sleep apnea)    Continue cpap. Compliant and beneficial.         Digestive   GERD    Continue omeprazole 20 mg daily.         Endocrine   DM type 2 with diabetic mixed hyperlipidemia ([place]) - Primary    Control: good Recommend check sugars fasting daily. Recommend check feet daily. Recommend annual eye exams. Medicines: continue metformin 500 mg twice daily  Continue to work on eating a healthy diet and exercise.  Labs drawn today.          Relevant Orders   Hemoglobin A1c (Completed)   Microalbumin / creatinine urine ratio (Completed)     Musculoskeletal and Integument  Corn of toe    Wearing larger shoes.      Lateral epicondylitis    Offered kenalog injection, but patient refused.  Restart elbow band.         Other   Duodenal mass   Shoulder joint pain    ibuprofen      RESOLVED: Vitamin D deficiency   Hyperlipidemia    Recommend continue to work on eating healthy diet and exercise. Recommend continue fish oil 1000 mg one capsule twice daily.  Since running out of crestor, her joint pain has improved. Consider addition of zetia in it's place. Patient has failed on lipitor.        Relevant Orders   Lipid panel (Completed)   Other Visit Diagnoses     Gastro-esophageal reflux disease without esophagitis         .  Meds ordered this encounter  Medications   acyclovir (ZOVIRAX) 400 MG tablet    Sig: Take 1 tablet (400 mg total) by mouth 2 (two) times daily.    Dispense:  10 tablet    Refill:  3   triamcinolone ointment (KENALOG) 0.1 %    Sig: Apply 1 application topically 2 (two) times daily.    Dispense:  80 g    Refill:  0     Orders Placed This Encounter  Procedures   CBC With Diff/Platelet   Comprehensive metabolic panel   Lipid panel   Hemoglobin A1c   Microalbumin / creatinine urine ratio     Follow-up: Return in about 3 months (around 11/03/2021) for chronic fasting.  An After Visit Summary was printed and given to the patient.  [name], MD [facility] [phone]

## 2021-08-06 ENCOUNTER — Ambulatory Visit (INDEPENDENT_AMBULATORY_CARE_PROVIDER_SITE_OTHER): Payer: Medicare Other | Admitting: Family Medicine

## 2021-08-06 ENCOUNTER — Other Ambulatory Visit: Payer: Self-pay

## 2021-08-06 ENCOUNTER — Encounter: Payer: Self-pay | Admitting: Family Medicine

## 2021-08-06 VITALS — BP 120/60 | HR 80 | Temp 97.0°F | Resp 16 | Ht <= 58 in | Wt 146.0 lb

## 2021-08-06 DIAGNOSIS — L84 Corns and callosities: Secondary | ICD-10-CM

## 2021-08-06 DIAGNOSIS — E782 Mixed hyperlipidemia: Secondary | ICD-10-CM | POA: Diagnosis not present

## 2021-08-06 DIAGNOSIS — K3189 Other diseases of stomach and duodenum: Secondary | ICD-10-CM | POA: Diagnosis not present

## 2021-08-06 DIAGNOSIS — K219 Gastro-esophageal reflux disease without esophagitis: Secondary | ICD-10-CM

## 2021-08-06 DIAGNOSIS — M7711 Lateral epicondylitis, right elbow: Secondary | ICD-10-CM

## 2021-08-06 DIAGNOSIS — G4733 Obstructive sleep apnea (adult) (pediatric): Secondary | ICD-10-CM

## 2021-08-06 DIAGNOSIS — E1169 Type 2 diabetes mellitus with other specified complication: Secondary | ICD-10-CM | POA: Diagnosis not present

## 2021-08-06 DIAGNOSIS — I152 Hypertension secondary to endocrine disorders: Secondary | ICD-10-CM

## 2021-08-06 DIAGNOSIS — I10 Essential (primary) hypertension: Secondary | ICD-10-CM

## 2021-08-06 DIAGNOSIS — E559 Vitamin D deficiency, unspecified: Secondary | ICD-10-CM

## 2021-08-06 DIAGNOSIS — E1159 Type 2 diabetes mellitus with other circulatory complications: Secondary | ICD-10-CM | POA: Diagnosis not present

## 2021-08-06 DIAGNOSIS — M25511 Pain in right shoulder: Secondary | ICD-10-CM

## 2021-08-06 MED ORDER — TRIAMCINOLONE ACETONIDE 0.1 % EX OINT: 1.0000 "application " | TOPICAL_OINTMENT | Freq: Two times a day (BID) | CUTANEOUS | 0 refills | Status: AC

## 2021-08-06 MED ORDER — ACYCLOVIR 400 MG PO TABS: 400.0000 mg | ORAL_TABLET | Freq: Two times a day (BID) | ORAL | 3 refills | Status: DC

## 2021-08-06 NOTE — Assessment & Plan Note (Signed)
-  Continue omeprazole 20 mg daily

## 2021-08-06 NOTE — Assessment & Plan Note (Signed)
The current medical regimen is effective;  continue present plan and medications. Continue Amlodipine 5mg  take 1/2 tablet daily, Losartan/HYDROCHLOROTHIAZIDE 100-25mg  take 1 tablet by mouth daily, Metoprolol 200mg  1 tablet daily. Check labs.

## 2021-08-06 NOTE — Assessment & Plan Note (Signed)
Recommend continue to work on eating healthy diet and exercise. Recommend continue fish oil 1000 mg one capsule twice daily.  Since running out of crestor, her joint pain has improved. Consider addition of zetia in it's place. Patient has failed on lipitor.

## 2021-08-06 NOTE — Assessment & Plan Note (Signed)
Control: good Recommend check sugars fasting daily. Recommend check feet daily. Recommend annual eye exams. Medicines: continue metformin 500 mg twice daily.  Continue to work on eating a healthy diet and exercise.  Labs drawn today.   

## 2021-08-06 NOTE — Assessment & Plan Note (Addendum)
Continue cpap. Compliant and beneficial.

## 2021-08-07 LAB — COMPREHENSIVE METABOLIC PANEL
ALT: 12 IU/L (ref 0–32)
AST: 17 IU/L (ref 0–40)
Albumin/Globulin Ratio: 1.9 (ref 1.2–2.2)
Albumin: 4.7 g/dL (ref 3.7–4.7)
Alkaline Phosphatase: 47 IU/L (ref 44–121)
BUN/Creatinine Ratio: 14 (ref 12–28)
BUN: 14 mg/dL (ref 8–27)
Bilirubin Total: 0.4 mg/dL (ref 0.0–1.2)
CO2: 25 mmol/L (ref 20–29)
Calcium: 10 mg/dL (ref 8.7–10.3)
Chloride: 98 mmol/L (ref 96–106)
Creatinine, Ser: 0.97 mg/dL (ref 0.57–1.00)
Globulin, Total: 2.5 g/dL (ref 1.5–4.5)
Glucose: 124 mg/dL — ABNORMAL HIGH (ref 70–99)
Potassium: 4 mmol/L (ref 3.5–5.2)
Sodium: 140 mmol/L (ref 134–144)
Total Protein: 7.2 g/dL (ref 6.0–8.5)
eGFR: 61 mL/min/{1.73_m2} (ref >59)

## 2021-08-07 LAB — MICROALBUMIN / CREATININE URINE RATIO
Creatinine, Urine: 100.8 mg/dL
Microalb/Creat Ratio: 6 mg/g creat (ref 0–29)
Microalbumin, Urine: 5.6 ug/mL

## 2021-08-07 LAB — CBC WITH DIFF/PLATELET
Basophils Absolute: 0 10*3/uL (ref 0.0–0.2)
Basos: 0 %
EOS (ABSOLUTE): 0.1 10*3/uL (ref 0.0–0.4)
Eos: 1 %
Hematocrit: 38.9 % (ref 34.0–46.6)
Hemoglobin: 13.2 g/dL (ref 11.1–15.9)
Immature Grans (Abs): 0 10*3/uL (ref 0.0–0.1)
Immature Granulocytes: 0 %
Lymphocytes Absolute: 2 10*3/uL (ref 0.7–3.1)
Lymphs: 44 %
MCH: 30.3 pg (ref 26.6–33.0)
MCHC: 33.9 g/dL (ref 31.5–35.7)
MCV: 89 fL (ref 79–97)
Monocytes Absolute: 0.4 10*3/uL (ref 0.1–0.9)
Monocytes: 8 %
Neutrophils Absolute: 2.1 10*3/uL (ref 1.4–7.0)
Neutrophils: 47 %
Platelets: 321 10*3/uL (ref 150–450)
RBC: 4.35 x10E6/uL (ref 3.77–5.28)
RDW: 12.8 % (ref 11.7–15.4)
WBC: 4.5 10*3/uL (ref 3.4–10.8)

## 2021-08-07 LAB — HEMOGLOBIN A1C
Est. average glucose Bld gHb Est-mCnc: 151 mg/dL
Hgb A1c MFr Bld: 6.9 % — ABNORMAL HIGH (ref 4.8–5.6)

## 2021-08-07 LAB — LIPID PANEL
Chol/HDL Ratio: 4.3 ratio (ref 0.0–4.4)
Cholesterol, Total: 222 mg/dL — ABNORMAL HIGH (ref 100–199)
HDL: 52 mg/dL (ref >39)
LDL Chol Calc (NIH): 141 mg/dL — ABNORMAL HIGH (ref 0–99)
Triglycerides: 160 mg/dL — ABNORMAL HIGH (ref 0–149)
VLDL Cholesterol Cal: 29 mg/dL (ref 5–40)

## 2021-08-08 NOTE — Progress Notes (Signed)
Blood count normal.  Liver function normal.  Kidney function normal.  Cholesterol:LDL much worse. LDL increased from 90 to 141. Trigs increased from 101 to 160. Recommend zetia 10 mg daily. Intolerant to statins.  HBA1C: 6.9. Not spilling protein in urine.  Follow up fasting in 3 months.

## 2021-08-11 NOTE — Assessment & Plan Note (Signed)
Continue cpap. Compliant and beneficial.

## 2021-08-11 NOTE — Assessment & Plan Note (Signed)
ibuprofen

## 2021-08-11 NOTE — Assessment & Plan Note (Signed)
Offered kenalog injection, but patient refused.  Restart elbow band.

## 2021-08-11 NOTE — Assessment & Plan Note (Signed)
Wearing larger shoes.

## 2021-08-12 ENCOUNTER — Other Ambulatory Visit: Payer: Self-pay

## 2021-08-12 MED ORDER — EZETIMIBE 10 MG PO TABS: 10.0000 mg | ORAL_TABLET | Freq: Every day | ORAL | 1 refills | Status: DC

## 2021-08-13 ENCOUNTER — Other Ambulatory Visit: Payer: Self-pay | Admitting: Family Medicine

## 2021-08-13 NOTE — Telephone Encounter (Signed)
Refill sent to pharmacy.   

## 2021-08-20 ENCOUNTER — Telehealth: Payer: Self-pay

## 2021-08-20 NOTE — Chronic Care Management (AMB) (Signed)
Chronic Care Management Pharmacy Assistant   Name: Mindy Ingram  MRN: [id number] DOB: 07/28/1945   Reason for Encounter: Disease State call for DM    Recent office visits:  08/12/21 Orders Only. Mindy Ingram CMA. Ordered Ezetimibe 11m daily.   08/06/21 CRochel BromeMD. Seen for DM. Started Acyclovir 4028mand Kenalog cream 0.1%.   07/10/21 Mindy RuffMA. Orders Only. D/C Albuterol Inhaler, Amlodipine 89m31mAzelastine 0.1%, Zyrtec 89m89mlonase nasal spray, Ibuprofen 800mg689mtformin 500mg,70maglutide 0.89mg, K43mlog cream, Valtrex 500mg.  11ment consult visits:  07/16/21 (Cardiology) RevankarJyl Ingram for HTN. No med changes.   Hospital visits:  None  Medications: Outpatient Encounter Medications as of 08/20/2021  Medication Sig   acyclovir (ZOVIRAX) 400 MG tablet Take 1 tablet (400 mg total) by mouth 2 (two) times daily.   albuterol (VENTOLIN HFA) 108 (90 Base) MCG/ACT inhaler Inhale 2 puffs into the lungs every 6 (six) hours as needed for wheezing or shortness of breath.   amLODipine (NORVASC) 5 MG tablet Take 0.5 tablets (2.5 mg total) by mouth daily as needed.   aspirin EC 81 MG tablet Take 81 mg by mouth daily.   azelastine (ASTELIN) 0.1 % nasal spray Place 1 spray into both nostrils 2 (two) times daily as needed for allergies or rhinitis.   cetirizine (ZYRTEC) 10 MG tablet Take 10 mg by mouth daily as needed for allergies.   Cholecalciferol (VITAMIN D PO) Take 2,000 Units by mouth daily.    CRESTOR 10 MG tablet TAKE 1 TABLET BY MOUTH 3 TIMES A WEEK   Elderberry 575 MG/5ML SYRP Take 5 mLs by mouth daily.   ezetimibe (ZETIA) 10 MG tablet Take 1 tablet (10 mg total) by mouth daily.   fluticasone (FLONASE) 50 MCG/ACT nasal spray Place 1-2 sprays into both nostrils daily.   ibuprofen (ADVIL) 800 MG tablet Take 800 mg by mouth every 8 (eight) hours as needed for headache.   losartan-hydrochlorothiazide (HYZAAR) 100-25 MG tablet Take 1 tablet by mouth daily.    metFORMIN (GLUCOPHAGE-XR) 500 MG 24 hr tablet TAKE 1 TABLET(500 MG) BY MOUTH TWICE DAILY   metoprolol (TOPROL-XL) 200 MG 24 hr tablet TAKE 1 TABLET BY MOUTH EVERY DAY   Multiple Vitamins-Minerals (COMPLETE ENERGY) TABS Take 1 tablet by mouth daily.   nitroGLYCERIN (NITROSTAT) 0.4 MG SL tablet Place 1 tablet (0.4 mg total) under the tongue every 5 (five) minutes as needed for chest pain.   Omega-3 Fatty Acids (FISH OIL) 1000 MG CAPS Take 1 capsule by mouth daily.   omeprazole (PRILOSEC) 20 MG capsule TAKE 1 CAPSULE(20 MG) BY MOUTH DAILY   PROLENSA 0.07 % SOLN Place 1 drop into the right eye every evening.   traMADol (ULTRAM) 50 MG tablet Take 50 mg by mouth 2 (two) times daily as needed for pain.   triamcinolone ointment (KENALOG) 0.1 % Apply 1 application topically 2 (two) times daily.   No facility-administered encounter medications on file as of 08/20/2021.    Recent Relevant Labs: Lab Results  Component Value Date/Time   HGBA1C 6.9 (H) 08/06/2021 10:51 AM   HGBA1C 6.8 (H) 04/08/2021 11:38 AM   MICROALBUR 10 12/25/2020 10:17 AM    Kidney Function Lab Results  Component Value Date/Time   CREATININE 0.97 08/06/2021 10:51 AM   CREATININE 0.81 04/08/2021 11:38 AM   GFRNONAA >60 09/14/2008 03:58 PM   GFRAA  09/14/2008 03:58 PM    >60        The eGFR has  been calculated using the MDRD equation. This calculation has not been validated in all clinical situations. eGFR's persistently <60 mL/min signify possible Chronic Kidney Disease.     Current antihyperglycemic regimen:  Metformin 548m twice daily  Patient verbally confirms she is taking the above medications as directed. Yes  What recent interventions/DTPs have been made to improve glycemic control:  Pt denies any changes   Have there been any recent hospitalizations or ED visits since last visit with CPP? No  Patient denies hypoglycemic symptoms  Patient denies hyperglycemic symptoms  How often are you checking  your blood sugar? once daily  What are your blood sugars ranging?  Fasting: 127 on 08/21/21 132 on 08/20/21  On insulin? No  During the week, how often does your blood glucose drop below 70? Never  Are you checking your feet daily/regularly? Yes  Adherence Review: Is the patient currently on a STATIN medication? Yes Is the patient currently on ACE/ARB medication? Yes Does the patient have >5 day gap between last estimated fill dates? CPP to review  Care Gaps: Last eye exam / Retinopathy Screening? 01/01/21 Last Annual Wellness Visit? 05/14/21 Last Diabetic Foot Exam? 08/06/21  Star Rating Drugs:  Medication:  Last Fill: Day Supply Metformin   07/29/21 90ds    02/19/21 [place]armac[place]  [phone]

## 2021-08-22 ENCOUNTER — Encounter: Payer: Self-pay | Admitting: Physician Assistant

## 2021-08-22 ENCOUNTER — Ambulatory Visit (INDEPENDENT_AMBULATORY_CARE_PROVIDER_SITE_OTHER): Payer: Medicare Other | Admitting: Physician Assistant

## 2021-08-22 VITALS — BP 112/74 | HR 75 | Resp 18 | Wt 148.4 lb

## 2021-08-22 DIAGNOSIS — J060 Acute laryngopharyngitis: Secondary | ICD-10-CM | POA: Diagnosis not present

## 2021-08-22 LAB — POC COVID19 BINAXNOW: SARS Coronavirus 2 Ag: NEGATIVE

## 2021-08-22 MED ORDER — TRIAMCINOLONE ACETONIDE 40 MG/ML IJ SUSP: 60.0000 mg | Freq: Once | INTRAMUSCULAR | Status: DC

## 2021-08-22 MED ORDER — AMOXICILLIN 875 MG PO TABS: 875.0000 mg | ORAL_TABLET | Freq: Two times a day (BID) | ORAL | 0 refills | Status: AC

## 2021-08-22 NOTE — Progress Notes (Signed)
Acute Office Visit  Subjective:    Patient ID: Mindy Ingram, female    DOB: 11/04/1945, 76 y.o.   MRN: [id number]  Chief Complaint  Patient presents with   sneezing   Sore Throat    HPI: Patient is in today for complaints of sore throat, sinus congestion and pnd -states that symptoms started over the weekend - she denies fever or headache States she has had blood in mucous drainage when blowing nose  Past Medical History:  Diagnosis Date   Acquired hammer toe of right foot 08/11/2017   Allergic asthma, mild intermittent, uncomplicated 8/52/7782   Office Spirometry 04/03/2015- WNL FVC 2.14/110%, FEV1 1.94/128%, FEV1/FVC 0.91  Office spirometry- 11/04/16--by allergy office- FEV1/FVC 0.77 WNL   Alopecia 06/25/2020   Asthma    Asthmatic bronchitis, mild intermittent, with acute exacerbation 10/11/2007   Qualifier: Diagnosis of  By: Annamaria Boots MD, Clinton D    Bilateral impacted cerumen 07/01/2018   Body mass index (BMI) 30.0-30.9, adult 07/10/2021   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   Chronic back pain    Contusion of head, initial encounter 03/18/2021   [place] of toe 06/25/2020   Costochondritis 06/25/2020   Cramp in lower leg associated with rest 06/25/2020   Degeneration of lumbosacral intervertebral disc 01/26/2012   DJD (degenerative joint disease)    DM type 2 with diabetic mixed hyperlipidemia ([place]) 04/08/2021   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   Hyperlipidemia 06/25/2020   Lateral epicondylitis 06/25/2020   Light headedness 03/18/2021   Low back pain 01/26/2012   Menopause 06/25/2020   Multiple benign melanocytic nevi 06/25/2020   Neck muscle spasm 04/26/2021   Obesity 07/10/2021   OSA (obstructive sleep apnea)    cpap  setting of 10   Other long term (current) drug therapy 06/25/2020   Seasonal and perennial allergic rhinitis 10/17/2007   Allergy vaccine 2002- dc'd   Shoulder joint pain 06/25/2020   Sleep apnea    Type II or unspecified type diabetes mellitus without mention of complication, not stated as uncontrolled    Upper airway cough syndrome 09/24/2015   Vitamin D deficiency 06/25/2020    Past Surgical History:  Procedure Laterality Date   Max  2013   lower back with plates and screws   benign lump right axilla     BILATERAL SALPINGOOPHORECTOMY  [place]   at [facility], slight limitation with turning neck   CHOLECYSTECTOMY  1995   EUS N/A 07/21/2013   Procedure: UPPER ENDOSCOPIC ULTRASOUND (EUS) LINEAR;  Surgeon: Milus Banister, MD;  Location: [facility];  Service: Endoscopy;  Laterality: N/A;   FOOT SURGERY     2007 - right great toe, 2000 - right fifth digit .   NEUROPLASTY / TRANSPOSITION MEDIAN NERVE AT [place]   PARTIAL COLECTOMY  1995   benign adhesions   PARTIAL HYSTERECTOMY  1980s.   abdominal   right foot surgery  2000   right great toe with artificial bone inserted   right knee arthroscopy Right 02/2017    Family History  Problem Relation Age of Onset   Heart disease Mother    Prostate cancer  Father    Heart attack Sister    Breast cancer Maternal Aunt    Prostate cancer Paternal Uncle    Allergies Neg Hx    Asthma Neg Hx    Eczema Neg Hx    Immunodeficiency Neg Hx     Social History   Socioeconomic History   Marital status: Widowed    Spouse name: Not on file   Number of children: 1   Years of education: Not on file   Highest education level: Not on file  Occupational History   Occupation: Retired  Tobacco Use   Smoking status: Never   Smokeless tobacco: Never  Vaping Use   Vaping Use: Never used  Substance and Sexual Activity   Alcohol use: No   Drug use: No   Sexual  activity: Not on file  Other Topics Concern   Not on file  Social History Narrative   Not on file   Social Determinants of Health   Financial Resource Strain: Low Risk    Difficulty of Paying Living Expenses: Not hard at all  Food Insecurity: No Food Insecurity   Worried About Charity fundraiser in the Last Year: Never true   Hope in the Last Year: Never true  Transportation Needs: No Transportation Needs   Lack of Transportation (Medical): No   Lack of Transportation (Non-Medical): No  Physical Activity: Not on file  Stress: Not on file  Social Connections: Not on file  Intimate Partner Violence: Not on file    Outpatient Medications Prior to Visit  Medication Sig Dispense Refill   acyclovir (ZOVIRAX) 400 MG tablet Take 1 tablet (400 mg total) by mouth 2 (two) times daily. 10 tablet 3   albuterol (VENTOLIN HFA) 108 (90 Base) MCG/ACT inhaler Inhale 2 puffs into the lungs every 6 (six) hours as needed for wheezing or shortness of breath.     amLODipine (NORVASC) 5 MG tablet Take 0.5 tablets (2.5 mg total) by mouth daily as needed. 90 tablet 0   aspirin EC 81 MG tablet Take 81 mg by mouth daily.     azelastine (ASTELIN) 0.1 % nasal spray Place 1 spray into both nostrils 2 (two) times daily as needed for allergies or rhinitis.     cetirizine (ZYRTEC) 10 MG tablet Take 10 mg by mouth daily as needed for allergies.     Cholecalciferol (VITAMIN D PO) Take 2,000 Units by mouth daily.      CRESTOR 10 MG tablet TAKE 1 TABLET BY MOUTH 3 TIMES A WEEK 36 tablet 0   Elderberry 575 MG/5ML SYRP Take 5 mLs by mouth daily.     ezetimibe (ZETIA) 10 MG tablet Take 1 tablet (10 mg total) by mouth daily. 90 tablet 1   fluticasone (FLONASE) 50 MCG/ACT nasal spray Place 1-2 sprays into both nostrils daily.     ibuprofen (ADVIL) 800 MG tablet Take 800 mg by mouth every 8 (eight) hours as needed for headache.     losartan-hydrochlorothiazide (HYZAAR) 100-25 MG tablet Take 1 tablet by mouth  daily. 90 tablet 1   metFORMIN (GLUCOPHAGE-XR) 500 MG 24 hr tablet TAKE 1 TABLET(500 MG) BY MOUTH TWICE DAILY 180 tablet 0   metoprolol (TOPROL-XL) 200 MG 24 hr tablet TAKE 1 TABLET BY MOUTH EVERY DAY 90 tablet 1   Multiple Vitamins-Minerals (COMPLETE ENERGY) TABS Take 1 tablet by mouth daily.     nitroGLYCERIN (NITROSTAT) 0.4 MG SL tablet Place 1 tablet (0.4 mg total) under the tongue  every 5 (five) minutes as needed for chest pain. 30 tablet 4   Omega-3 Fatty Acids (FISH OIL) 1000 MG CAPS Take 1 capsule by mouth daily.     omeprazole (PRILOSEC) 20 MG capsule TAKE 1 CAPSULE(20 MG) BY MOUTH DAILY 90 capsule 1   PROLENSA 0.07 % SOLN Place 1 drop into the right eye every evening.     traMADol (ULTRAM) 50 MG tablet Take 50 mg by mouth 2 (two) times daily as needed for pain.     triamcinolone ointment (KENALOG) 0.1 % Apply 1 application topically 2 (two) times daily. 80 g 0   No facility-administered medications prior to visit.    Allergies  Allergen Reactions   Dairy Aid [Tilactase]     Hives, diarrhea   Lactose Anaphylaxis and Other (See Comments)   Atorvastatin Other (See Comments)    Severe myalgias   Hydrocodone-Acetaminophen     REACTION: sore throat with vicodin   Hydrocodone-Acetaminophen Other (See Comments)    sore throat with vicodin    Milk-Related Compounds Other (See Comments)   Mirabegron Other (See Comments)    Headache    Oxycontin [Oxycodone Hcl]     "makes me crazy"   Prochlorperazine Edisylate     REACTION: stroke-like symptoms with companzine   Rosuvastatin     Can take brand crestor-severe myalgias   Shellfish Allergy    Umeclidinium-Vilanterol Other (See Comments)    Lactose allergy   Percodan [Oxycodone-Aspirin] Palpitations    Fast heartbeat with percodan    Review of Systems CONSTITUTIONAL:see HPI E/N/T: see HPI CARDIOVASCULAR: Negative for chest pain, dizziness, palpitations and pedal edema.  RESPIRATORY: see HPI INTEGUMENTARY: Negative for rash.           Objective:    Physical Exam PHYSICAL EXAM:   VS: BP 112/74    Pulse 75    Resp 18    Wt 148 lb 6.4 oz (67.3 kg)    SpO2 97%    BMI 32.11 kg/m   GEN: Well nourished, well developed, in no acute distress  HEENT: normal external ears and nose - normal external auditory canals and TMS -l - normal nasal mucosa and septum - Lips, Teeth and Gums - normal  Oropharynx - erythema and pnd noted Cardiac: RRR; no murmurs, rubs, or gallops,no edema -  Respiratory:  normal respiratory rate and pattern with no distress - normal breath sounds with no rales, rhonchi, wheezes or rubs Skin: warm and dry, no rash   Office Visit on 08/22/2021  Component Date Value Ref Range Status   SARS Coronavirus 2 Ag 08/22/2021 Negative  Negative Final     BP 112/74    Pulse 75    Resp 18    Wt 148 lb 6.4 oz (67.3 kg)    SpO2 97%    BMI 32.11 kg/m  Wt Readings from Last 3 Encounters:  08/22/21 148 lb 6.4 oz (67.3 kg)  08/06/21 146 lb (66.2 kg)  07/16/21 146 lb 6.4 oz (66.4 kg)    Health Maintenance Due  Topic Date Due   Hepatitis C Screening  Never done   Zoster Vaccines- Shingrix (1 of 2) Never done    There are no preventive care reminders to display for this patient.   Lab Results  Component Value Date   TSH 1.450 09/20/2020   Lab Results  Component Value Date   WBC 4.5 08/06/2021   HGB 13.2 08/06/2021   HCT 38.9 08/06/2021   MCV 89 08/06/2021   PLT  321 08/06/2021   Lab Results  Component Value Date   NA 140 08/06/2021   K 4.0 08/06/2021   CO2 25 08/06/2021   GLUCOSE 124 (H) 08/06/2021   BUN 14 08/06/2021   CREATININE 0.97 08/06/2021   BILITOT 0.4 08/06/2021   ALKPHOS 47 08/06/2021   AST 17 08/06/2021   ALT 12 08/06/2021   PROT 7.2 08/06/2021   ALBUMIN 4.7 08/06/2021   CALCIUM 10.0 08/06/2021   EGFR 61 08/06/2021   Lab Results  Component Value Date   CHOL 222 (H) 08/06/2021   Lab Results  Component Value Date   HDL 52 08/06/2021   Lab Results  Component Value  Date   LDLCALC 141 (H) 08/06/2021   Lab Results  Component Value Date   TRIG 160 (H) 08/06/2021   Lab Results  Component Value Date   CHOLHDL 4.3 08/06/2021   Lab Results  Component Value Date   HGBA1C 6.9 (H) 08/06/2021       Assessment & Plan:   Problem List Items Addressed This Visit   None Visit Diagnoses     Acute laryngopharyngitis    -  Primary   Relevant Medications   triamcinolone acetonide (KENALOG-40) injection 60 mg   amoxicillin (AMOXIL) 875 MG tablet   Other Relevant Orders   POC COVID-19 (Completed)      Meds ordered this encounter  Medications   triamcinolone acetonide (KENALOG-40) injection 60 mg   amoxicillin (AMOXIL) 875 MG tablet    Sig: Take 1 tablet (875 mg total) by mouth 2 (two) times daily for 10 days.    Dispense:  20 tablet    Refill:  0    Order Specific Question:   Supervising Provider    AnswerShelton Silvas    Orders Placed This Encounter  Procedures   POC COVID-19     Follow-up: Return if symptoms worsen or fail to improve.  An After Visit Summary was printed and given to the patient.  Yetta Flock [facility] [phone]

## 2021-09-16 ENCOUNTER — Telehealth: Payer: Self-pay

## 2021-09-16 NOTE — Progress Notes (Signed)
? ? ?Chronic Care Management ?Pharmacy Assistant  ? ?Name: Mindy Ingram  MRN: [id number] DOB: 1945/12/14 ? ? ?Reason for Encounter: Disease State call DM  ?  ?Recent office visits:  ?08/22/21 Mindy Duncans PA-C. Seen for Acute Laryngopharyngitis. Started on Amoxicillin 831m. Administered Kenalog 6104m  ? ?Recent consult visits:  ?None ? ?Hospital visits:  ?None ? ?Medications: ?Outpatient Encounter Medications as of 09/16/2021  ?Medication Sig  ? acyclovir (ZOVIRAX) 400 MG tablet Take 1 tablet (400 mg total) by mouth 2 (two) times daily.  ? albuterol (VENTOLIN HFA) 108 (90 Base) MCG/ACT inhaler Inhale 2 puffs into the lungs every 6 (six) hours as needed for wheezing or shortness of breath.  ? amLODipine (NORVASC) 5 MG tablet Take 0.5 tablets (2.5 mg total) by mouth daily as needed.  ? aspirin EC 81 MG tablet Take 81 mg by mouth daily.  ? azelastine (ASTELIN) 0.1 % nasal spray Place 1 spray into both nostrils 2 (two) times daily as needed for allergies or rhinitis.  ? cetirizine (ZYRTEC) 10 MG tablet Take 10 mg by mouth daily as needed for allergies.  ? Cholecalciferol (VITAMIN D PO) Take 2,000 Units by mouth daily.   ? CRESTOR 10 MG tablet TAKE 1 TABLET BY MOUTH 3 TIMES A WEEK  ? Elderberry 575 MG/5ML SYRP Take 5 mLs by mouth daily.  ? ezetimibe (ZETIA) 10 MG tablet Take 1 tablet (10 mg total) by mouth daily.  ? fluticasone (FLONASE) 50 MCG/ACT nasal spray Place 1-2 sprays into both nostrils daily.  ? ibuprofen (ADVIL) 800 MG tablet Take 800 mg by mouth every 8 (eight) hours as needed for headache.  ? losartan-hydrochlorothiazide (HYZAAR) 100-25 MG tablet Take 1 tablet by mouth daily.  ? metFORMIN (GLUCOPHAGE-XR) 500 MG 24 hr tablet TAKE 1 TABLET(500 MG) BY MOUTH TWICE DAILY  ? metoprolol (TOPROL-XL) 200 MG 24 hr tablet TAKE 1 TABLET BY MOUTH EVERY DAY  ? Multiple Vitamins-Minerals (COMPLETE ENERGY) TABS Take 1 tablet by mouth daily.  ? nitroGLYCERIN (NITROSTAT) 0.4 MG SL tablet Place 1 tablet (0.4 mg total) under  the tongue every 5 (five) minutes as needed for chest pain.  ? Omega-3 Fatty Acids (FISH OIL) 1000 MG CAPS Take 1 capsule by mouth daily.  ? omeprazole (PRILOSEC) 20 MG capsule TAKE 1 CAPSULE(20 MG) BY MOUTH DAILY  ? PROLENSA 0.07 % SOLN Place 1 drop into the right eye every evening.  ? traMADol (ULTRAM) 50 MG tablet Take 50 mg by mouth 2 (two) times daily as needed for pain.  ? triamcinolone ointment (KENALOG) 0.1 % Apply 1 application topically 2 (two) times daily.  ? ?Facility-Administered Encounter Medications as of 09/16/2021  ?Medication  ? triamcinolone acetonide (KENALOG-40) injection 60 mg  ? ? ?Recent Relevant Labs: ?Lab Results  ?Component Value Date/Time  ? HGBA1C 6.9 (H) 08/06/2021 10:51 AM  ? HGBA1C 6.8 (H) 04/08/2021 11:38 AM  ? MICROALBUR 10 12/25/2020 10:17 AM  ?  ?Kidney Function ?Lab Results  ?Component Value Date/Time  ? CREATININE 0.97 08/06/2021 10:51 AM  ? CREATININE 0.81 04/08/2021 11:38 AM  ? GFRNONAA >60 09/14/2008 03:58 PM  ? GFRAA  09/14/2008 03:58 PM  ?  >60        ?The eGFR has been calculated ?using the MDRD equation. ?This calculation has not been ?validated in all clinical ?situations. ?eGFR's persistently ?<60 mL/min signify ?possible Chronic Kidney Disease.  ? ? ? ?Current antihyperglycemic regimen:  ?Metformin 50051mwice daily  ?Patient verbally confirms she is taking the above  medications as directed. Yes ? ?What recent interventions/DTPs have been made to improve glycemic control:  ?Pt denies any changes  ? ?Have there been any recent hospitalizations or ED visits since last visit with CPP? No ? ?Patient reports hypoglycemic symptoms, including Hungry ? ?Patient denies hyperglycemic symptoms, including fatigue ? ?How often are you checking your blood sugar? once daily ? ?What are your blood sugars ranging?  ?After meal : 09/16/21 185, 09/15/21 240, 09/12/21 184 ?Fasting: 09/15/21 130, 09/14/21 114, 119, 09/13/21 112, 09/12/21 94 ? ?On insulin? No ? ?During the week, how often does your  blood glucose drop below 70? Never ? ?Are you checking your feet daily/regularly? Yes ? ?Adherence Review: ?Is the patient currently on a STATIN medication? Yes ?Is the patient currently on ACE/ARB medication? Yes ?Does the patient have >5 day gap between last estimated fill dates? CPP to review ? ?Care Gaps: ?Last eye exam / Retinopathy Screening? 01/01/21 ?Last Annual Wellness Visit? 05/14/21 ?Last Diabetic Foot Exam? 08/06/21 ?  ? ?Star Rating Drugs:  ?Medication:  Last Fill: Day Supply ?Metformin                    07/29/21            90ds ?                                    02/19/21            90ds ?  ? ?Mindy Ingram, CMA ?Clinical Pharmacist Assistant  ?[phone]  ?

## 2021-10-16 ENCOUNTER — Other Ambulatory Visit: Payer: Self-pay | Admitting: Family Medicine

## 2021-10-16 NOTE — Telephone Encounter (Signed)
Refill sent to pharmacy.   

## 2021-10-31 ENCOUNTER — Other Ambulatory Visit: Payer: Self-pay

## 2021-10-31 MED ORDER — METFORMIN HCL ER 500 MG PO TB24: ORAL_TABLET | ORAL | 2 refills | Status: DC

## 2021-11-11 ENCOUNTER — Telehealth: Payer: Self-pay

## 2021-11-11 ENCOUNTER — Other Ambulatory Visit: Payer: Self-pay | Admitting: Family Medicine

## 2021-11-11 NOTE — Progress Notes (Signed)
Chronic Care Management Pharmacy Assistant   Name: Mindy Ingram  MRN: [id number] DOB: 26-Oct-1945   Reason for Encounter: Disease State call for DM    Recent office visits:  None  Recent consult visits:  None  Hospital visits:  None  Medications: Outpatient Encounter Medications as of 11/11/2021  Medication Sig   acyclovir (ZOVIRAX) 400 MG tablet Take 1 tablet (400 mg total) by mouth 2 (two) times daily.   albuterol (VENTOLIN HFA) 108 (90 Base) MCG/ACT inhaler Inhale 2 puffs into the lungs every 6 (six) hours as needed for wheezing or shortness of breath.   amLODipine (NORVASC) 5 MG tablet Take 0.5 tablets (2.5 mg total) by mouth daily as needed.   aspirin EC 81 MG tablet Take 81 mg by mouth daily.   azelastine (ASTELIN) 0.1 % nasal spray Place 1 spray into both nostrils 2 (two) times daily as needed for allergies or rhinitis.   cetirizine (ZYRTEC) 10 MG tablet Take 10 mg by mouth daily as needed for allergies.   Cholecalciferol (VITAMIN D PO) Take 2,000 Units by mouth daily.    CRESTOR 10 MG tablet TAKE 1 TABLET BY MOUTH 3 TIMES A WEEK   Elderberry 575 MG/5ML SYRP Take 5 mLs by mouth daily.   ezetimibe (ZETIA) 10 MG tablet Take 1 tablet (10 mg total) by mouth daily.   fluticasone (FLONASE) 50 MCG/ACT nasal spray Place 1-2 sprays into both nostrils daily.   ibuprofen (ADVIL) 800 MG tablet Take 800 mg by mouth every 8 (eight) hours as needed for headache.   losartan-hydrochlorothiazide (HYZAAR) 100-25 MG tablet TAKE 1 TABLET BY MOUTH DAILY   metFORMIN (GLUCOPHAGE-XR) 500 MG 24 hr tablet TAKE 1 TABLET(500 MG) BY MOUTH TWICE DAILY   metoprolol (TOPROL-XL) 200 MG 24 hr tablet TAKE 1 TABLET BY MOUTH EVERY DAY   Multiple Vitamins-Minerals (COMPLETE ENERGY) TABS Take 1 tablet by mouth daily.   nitroGLYCERIN (NITROSTAT) 0.4 MG SL tablet Place 1 tablet (0.4 mg total) under the tongue every 5 (five) minutes as needed for chest pain.   Omega-3 Fatty Acids (FISH OIL) 1000 MG CAPS  Take 1 capsule by mouth daily.   omeprazole (PRILOSEC) 20 MG capsule TAKE 1 CAPSULE(20 MG) BY MOUTH DAILY   PROLENSA 0.07 % SOLN Place 1 drop into the right eye every evening.   traMADol (ULTRAM) 50 MG tablet Take 50 mg by mouth 2 (two) times daily as needed for pain.   triamcinolone ointment (KENALOG) 0.1 % Apply 1 application topically 2 (two) times daily.   Facility-Administered Encounter Medications as of 11/11/2021  Medication   triamcinolone acetonide (KENALOG-40) injection 60 mg    Recent Relevant Labs: Lab Results  Component Value Date/Time   HGBA1C 6.9 (H) 08/06/2021 10:51 AM   HGBA1C 6.8 (H) 04/08/2021 11:38 AM   MICROALBUR 10 12/25/2020 10:17 AM    Kidney Function Lab Results  Component Value Date/Time   CREATININE 0.97 08/06/2021 10:51 AM   CREATININE 0.81 04/08/2021 11:38 AM   GFRNONAA >60 09/14/2008 03:58 PM   GFRAA  09/14/2008 03:58 PM    >60        The eGFR has been calculated using the MDRD equation. This calculation has not been validated in all clinical situations. eGFR's persistently <60 mL/min signify possible Chronic Kidney Disease.     Current antihyperglycemic regimen:  Metformin 515m twice daily  Patient verbally confirms she is taking the above medications as directed. Yes  What recent interventions/DTPs have been made to improve glycemic  control:  No recent changes   Have there been any recent hospitalizations or ED visits since last visit with CPP? No  Patient denies hypoglycemic symptoms, including None  Patient denies hyperglycemic symptoms, including none  How often are you checking your blood sugar? once daily  What are your blood sugars ranging?  Fasting: 11/27/21 129,  5/30/23122, 11/25/21 153 Pt has been eating a lot of sweets lately. She stated her birthday was a few weeks ago.   On insulin? No  During the week, how often does your blood glucose drop below 70? Never  Are you checking your feet daily/regularly?  Yes  Adherence Review: Is the patient currently on a STATIN medication? Yes Is the patient currently on ACE/ARB medication? Yes Does the patient have >5 day gap between last estimated fill dates? CPP to review  Care Gaps: Last eye exam / Retinopathy Screening? 01/01/21 Last Annual Wellness Visit? 05/14/21 Last Diabetic Foot Exam? 08/06/21    Star Rating Drugs:  Medication:  Last Fill: Day Supply Metformin   10/31/21  90ds    07/29/21 [place] Pharmacist Assistant  [phone]

## 2021-11-11 NOTE — Telephone Encounter (Signed)
Refill sent to pharmacy.   

## 2021-11-19 ENCOUNTER — Encounter: Payer: Self-pay | Admitting: Family Medicine

## 2021-11-19 ENCOUNTER — Ambulatory Visit (INDEPENDENT_AMBULATORY_CARE_PROVIDER_SITE_OTHER): Payer: Medicare Other | Admitting: Family Medicine

## 2021-11-19 VITALS — BP 134/72 | HR 68 | Resp 18 | Wt 151.0 lb

## 2021-11-19 DIAGNOSIS — M79671 Pain in right foot: Secondary | ICD-10-CM | POA: Insufficient documentation

## 2021-11-19 DIAGNOSIS — I152 Hypertension secondary to endocrine disorders: Secondary | ICD-10-CM | POA: Diagnosis not present

## 2021-11-19 DIAGNOSIS — E782 Mixed hyperlipidemia: Secondary | ICD-10-CM

## 2021-11-19 DIAGNOSIS — Z6832 Body mass index (BMI) 32.0-32.9, adult: Secondary | ICD-10-CM

## 2021-11-19 DIAGNOSIS — G8929 Other chronic pain: Secondary | ICD-10-CM | POA: Diagnosis not present

## 2021-11-19 DIAGNOSIS — M25511 Pain in right shoulder: Secondary | ICD-10-CM | POA: Diagnosis not present

## 2021-11-19 DIAGNOSIS — E66811 Obesity, class 1: Secondary | ICD-10-CM | POA: Insufficient documentation

## 2021-11-19 DIAGNOSIS — E559 Vitamin D deficiency, unspecified: Secondary | ICD-10-CM

## 2021-11-19 DIAGNOSIS — K219 Gastro-esophageal reflux disease without esophagitis: Secondary | ICD-10-CM

## 2021-11-19 DIAGNOSIS — I10 Essential (primary) hypertension: Secondary | ICD-10-CM | POA: Diagnosis not present

## 2021-11-19 DIAGNOSIS — E1159 Type 2 diabetes mellitus with other circulatory complications: Secondary | ICD-10-CM

## 2021-11-19 DIAGNOSIS — G4733 Obstructive sleep apnea (adult) (pediatric): Secondary | ICD-10-CM | POA: Diagnosis not present

## 2021-11-19 DIAGNOSIS — E1169 Type 2 diabetes mellitus with other specified complication: Secondary | ICD-10-CM | POA: Diagnosis not present

## 2021-11-19 DIAGNOSIS — E6609 Other obesity due to excess calories: Secondary | ICD-10-CM | POA: Insufficient documentation

## 2021-11-19 MED ORDER — ACYCLOVIR 400 MG PO TABS: 400.0000 mg | ORAL_TABLET | Freq: Two times a day (BID) | ORAL | 3 refills | Status: DC

## 2021-11-19 NOTE — Assessment & Plan Note (Signed)
Well controlled.  No changes to medicines.  Continue to work on eating a healthy diet and exercise.  Labs drawn today.  Amlodipine '5mg'$  take 1/2 tablet daily, Losartan/hctz 100-'25mg'$  take 1 tablet by mouth daily, Metoprolol '200mg'$  1 tablet daily.

## 2021-11-19 NOTE — Patient Instructions (Addendum)
Take ibuprofen '800mg'$  when needed for pain. Can take tylenol as well.  We will call you with appointments for xrays.

## 2021-11-19 NOTE — Assessment & Plan Note (Signed)
Control:  Recommend check sugars fasting daily. Recommend check feet daily. Recommend annual eye exams. Medicines: Metformin '500mg'$  take 1 tablet two times daily. Continue to work on eating a healthy diet and exercise.  Labs drawn today.

## 2021-11-19 NOTE — Progress Notes (Unsigned)
Subjective:  Patient ID: Mindy Ingram, female    DOB: May 05, 1946  Age: 76 y.o. MRN: [id number]  Chief Complaint  Patient presents with   Diabetes   Hyperlipidemia   Arm Pain    Right- 6 months    Leg Pain    Right    Complains of right arm pain since receiving vaccine 6 months ago. Right lower leg pain and foot pain for one year. Behind knee swelling on right leg. Requests more tablets of acyclovir.   Diabetes:  Complications: hyperlipidemia, hypertension. Glucose checking: 5 times per day Glucose logs: 83-189 Hypoglycemia: none Most recent A1C: 6.9 Current medications: Metformin '500mg'$  take 1 tablet two times daily. Foot checks: daily. No sores.   Hyperlipidemia: Current medications: Zetia 10 mg Fish Oil 1000 mg take 1 capsule twice daily.  Hypertension: Current medications: Amlodipine '5mg'$  take 1/2 tablet daily, Losartan/HYDROCHLOROTHIAZIDE 100-'25mg'$  take 1 tablet by mouth daily, Metoprolol '200mg'$  1 tablet daily. 107/58, she does not take losartan if gets a reading like this. Does take losartan most days.   Obstructive Sleep Apnea: Wears CPAP. Helps significantly.  GERD: Current medications: Omeprazole '20mg'$  take 1 capsule daily.  Diet: some healthy, has been eating more sweets this month due to birthday  Exercise: active  HPI reviewed and updated.    Current Outpatient Medications on File Prior to Visit  Medication Sig Dispense Refill   acyclovir (ZOVIRAX) 400 MG tablet Take 1 tablet (400 mg total) by mouth 2 (two) times daily. 10 tablet 3   albuterol (VENTOLIN HFA) 108 (90 Base) MCG/ACT inhaler Inhale 2 puffs into the lungs every 6 (six) hours as needed for wheezing or shortness of breath.     amLODipine (NORVASC) 5 MG tablet Take 0.5 tablets (2.5 mg total) by mouth daily as needed. 90 tablet 0   aspirin EC 81 MG tablet Take 81 mg by mouth daily.     azelastine (ASTELIN) 0.1 % nasal spray Place 1 spray into both nostrils 2 (two) times daily as needed for allergies  or rhinitis.     cetirizine (ZYRTEC) 10 MG tablet Take 10 mg by mouth daily as needed for allergies.     Cholecalciferol (VITAMIN D PO) Take 2,000 Units by mouth daily.      CRESTOR 10 MG tablet TAKE 1 TABLET BY MOUTH 3 TIMES A WEEK 36 tablet 0   Elderberry 575 MG/5ML SYRP Take 5 mLs by mouth daily.     ezetimibe (ZETIA) 10 MG tablet Take 1 tablet (10 mg total) by mouth daily. 90 tablet 1   fluticasone (FLONASE) 50 MCG/ACT nasal spray Place 1-2 sprays into both nostrils daily.     ibuprofen (ADVIL) 800 MG tablet Take 800 mg by mouth every 8 (eight) hours as needed for headache.     losartan-hydrochlorothiazide (HYZAAR) 100-25 MG tablet TAKE 1 TABLET BY MOUTH DAILY 90 tablet 1   metFORMIN (GLUCOPHAGE-XR) 500 MG 24 hr tablet TAKE 1 TABLET(500 MG) BY MOUTH TWICE DAILY 180 tablet 2   metoprolol (TOPROL-XL) 200 MG 24 hr tablet TAKE 1 TABLET BY MOUTH EVERY DAY 90 tablet 1   Multiple Vitamins-Minerals (COMPLETE ENERGY) TABS Take 1 tablet by mouth daily.     nitroGLYCERIN (NITROSTAT) 0.4 MG SL tablet Place 1 tablet (0.4 mg total) under the tongue every 5 (five) minutes as needed for chest pain. 30 tablet 4   Omega-3 Fatty Acids (FISH OIL) 1000 MG CAPS Take 1 capsule by mouth daily.     omeprazole (PRILOSEC) 20 MG  capsule TAKE 1 CAPSULE(20 MG) BY MOUTH DAILY 90 capsule 1   traMADol (ULTRAM) 50 MG tablet Take 50 mg by mouth 2 (two) times daily as needed for pain.     triamcinolone ointment (KENALOG) 0.1 % Apply 1 application topically 2 (two) times daily. 80 g 0   Current Facility-Administered Medications on File Prior to Visit  Medication Dose Route Frequency Provider Last Rate Last Admin   triamcinolone acetonide (KENALOG-40) injection 60 mg  60 mg Intramuscular Once [name], PA-C       Past Medical History:  Diagnosis Date   Acquired hammer toe of right foot 08/11/2017   Allergic asthma, mild intermittent, uncomplicated 01/09/4579   Office Spirometry 04/03/2015- WNL FVC 2.14/110%, FEV1  1.94/128%, FEV1/FVC 0.91  Office spirometry- 11/04/16--by allergy office- FEV1/FVC 0.77 WNL   Alopecia 06/25/2020   Asthma    Asthmatic bronchitis, mild intermittent, with acute exacerbation 10/11/2007   Qualifier: Diagnosis of  By: Annamaria Boots MD, [place] D    Bilateral impacted cerumen 07/01/2018   Body mass index (BMI) 30.0-30.9, adult 07/10/2021   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   Chronic back pain    Contusion of head, initial encounter 03/18/2021   [place] of toe 06/25/2020   Costochondritis 06/25/2020   Cramp in lower leg associated with rest 06/25/2020   Degeneration of lumbosacral intervertebral disc 01/26/2012   DJD (degenerative joint disease)    DM type 2 with diabetic mixed hyperlipidemia ([place]) 04/08/2021   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   Hyperlipidemia 06/25/2020   Lateral epicondylitis 06/25/2020   Light headedness 03/18/2021   Low back pain 01/26/2012   Menopause 06/25/2020   Multiple benign melanocytic nevi 06/25/2020   Neck muscle spasm 04/26/2021   Obesity 07/10/2021   OSA (obstructive sleep apnea)    cpap setting of 10   Other long term (current) drug therapy 06/25/2020   Seasonal and perennial allergic rhinitis 10/17/2007   Allergy vaccine 2002- dc'd   Shoulder joint pain 06/25/2020   Sleep apnea    Type II or unspecified type diabetes mellitus without mention of complication, not stated as uncontrolled    Upper airway cough syndrome 09/24/2015   Vitamin D deficiency 06/25/2020   Past Surgical History:  Procedure Laterality Date   [place]  2013   lower back with plates and screws   benign lump right axilla     [place]   at [facility],  slight limitation with turning neck   CHOLECYSTECTOMY  1995   EUS N/A 07/21/2013   Procedure: UPPER ENDOSCOPIC ULTRASOUND (EUS) LINEAR;  Surgeon: Milus Banister, MD;  Location: [facility];  Service: Endoscopy;  Laterality: N/A;   FOOT SURGERY     2007 - right great toe, 2000 - right fifth digit .   NEUROPLASTY / TRANSPOSITION MEDIAN NERVE AT [facility]   PARTIAL COLECTOMY  1995   benign adhesions   PARTIAL HYSTERECTOMY  1980s.   abdominal   right foot surgery  2000   right great toe with artificial bone inserted   right knee arthroscopy Right 02/2017    Family History  Problem Relation Age of  Onset   Heart disease Mother    Prostate cancer Father    Heart attack Sister    Breast cancer Maternal Aunt    Prostate cancer Paternal Uncle    Allergies Neg Hx    Asthma Neg Hx    Eczema Neg Hx    Immunodeficiency Neg Hx    Social History   Socioeconomic History   Marital status: Widowed    Spouse name: Not on file   Number of children: 1   Years of education: Not on file   Highest education level: Not on file  Occupational History   Occupation: Retired  Tobacco Use   Smoking status: Never   Smokeless tobacco: Never  Vaping Use   Vaping Use: Never used  Substance and Sexual Activity   Alcohol use: No   Drug use: No   Sexual activity: Not on file  Other Topics Concern   Not on file  Social History Narrative   Not on file   Social Determinants of Health   Financial Resource Strain: Low Risk    Difficulty of Paying Living Expenses: Not hard at all  Food Insecurity: No Food Insecurity   Worried About Charity fundraiser in the Last Year: Never true   [place] in the Last Year: Never true  Transportation Needs: No Transportation Needs   Lack of Transportation (Medical): No   Lack of Transportation (Non-Medical): No  Physical Activity: Not on file  Stress: Not on file  Social Connections: Not on file    Review of Systems  Constitutional:   Negative for appetite change, fatigue and fever.  HENT:  Negative for congestion, ear pain, sinus pressure and sore throat.   Respiratory:  Negative for cough, chest tightness, shortness of breath and wheezing.   Cardiovascular:  Negative for chest pain and palpitations.  Gastrointestinal:  Negative for abdominal pain, constipation, diarrhea, nausea and vomiting.  Endocrine: Negative for polydipsia, polyphagia and polyuria.  Genitourinary:  Negative for dysuria and hematuria.  Musculoskeletal:  Positive for arthralgias (right shoulder. rt foot.) and myalgias (cramps in feet. rt shin hurts at night.). Negative for back pain and joint swelling.  Skin:  Negative for rash.  Neurological:  Negative for dizziness, weakness and headaches.  Psychiatric/Behavioral:  Negative for dysphoric mood. The patient is not nervous/anxious.     Objective:  BP 134/72   Pulse 68   Resp 18   Wt 151 lb (68.5 kg)   SpO2 96%   BMI 32.68 kg/m      11/19/2021    9:17 AM 08/22/2021   11:11 AM 08/06/2021   10:05 AM  BP/Weight  Systolic BP 124 580 998  Diastolic BP 72 74 60  Wt. (Lbs) 151 148.4 146  BMI 32.68 kg/m2 32.11 kg/m2 31.59 kg/m2    Physical Exam Vitals reviewed.  Constitutional:      Appearance: Normal appearance. She is obese.  Neck:     Vascular: No carotid bruit.  Cardiovascular:     Rate and Rhythm: Normal rate and regular rhythm.     Heart sounds: Normal heart sounds.  Pulmonary:     Effort: Pulmonary effort is normal.     Breath sounds: Normal breath sounds.  Abdominal:     General: Abdomen is flat. Bowel sounds are normal.     Palpations: Abdomen is soft.  Musculoskeletal:     Right shoulder: Tenderness present.     Left shoulder: Normal.     Right upper arm: Tenderness  present.     Comments: Positive empty can sign, limited ROM; discomfort with abduction   Neurological:     Mental Status: She is alert and oriented to person, place, and time.  Psychiatric:        Mood and  Affect: Mood normal.        Behavior: Behavior normal.    Diabetic Foot Exam - Simple   Simple Foot Form Diabetic Foot exam was performed with the following findings: Yes 11/19/2021  9:51 AM  Visual Inspection See comments: Yes Sensation Testing Intact to touch and monofilament testing bilaterally: Yes Pulse Check Posterior Tibialis and Dorsalis pulse intact bilaterally: Yes Comments Does have callus', third right toe has callus on dorsal surface.       Lab Results  Component Value Date   WBC 4.5 08/06/2021   HGB 13.2 08/06/2021   HCT 38.9 08/06/2021   PLT 321 08/06/2021   GLUCOSE 124 (H) 08/06/2021   CHOL 222 (H) 08/06/2021   TRIG 160 (H) 08/06/2021   HDL 52 08/06/2021   LDLCALC 141 (H) 08/06/2021   ALT 12 08/06/2021   AST 17 08/06/2021   NA 140 08/06/2021   K 4.0 08/06/2021   CL 98 08/06/2021   CREATININE 0.97 08/06/2021   BUN 14 08/06/2021   CO2 25 08/06/2021   TSH 1.450 09/20/2020   HGBA1C 6.9 (H) 08/06/2021   MICROALBUR 10 12/25/2020      Assessment & Plan:   Problem List Items Addressed This Visit       Cardiovascular and Mediastinum   Hypertension associated with diabetes ([place])    Well controlled.  No changes to medicines.  Continue to work on eating a healthy diet and exercise.  Labs drawn today.  Amlodipine '5mg'$  take 1/2 tablet daily, Losartan/hctz 100-'25mg'$  take 1 tablet by mouth daily, Metoprolol '200mg'$  1 tablet daily.        Respiratory   OSA (obstructive sleep apnea)    Continue CPAP.         Endocrine   DM type 2 with diabetic mixed hyperlipidemia ([place]) - Primary    Control:  Recommend check sugars fasting daily. Recommend check feet daily. Recommend annual eye exams. Medicines: Metformin '500mg'$  take 1 tablet two times daily. Continue to work on eating a healthy diet and exercise.  Labs drawn today.         Relevant Orders   Hemoglobin A1c     Other   Hyperlipidemia    Well controlled.  No changes to medicines.  Continue to  work on eating a healthy diet and exercise.  Labs drawn today.  Zetia 10 mg, Fish Oil 1000 mg take 1 capsule twice daily.       Relevant Orders   Lipid panel   TSH   Chronic right shoulder pain    Xray ordered.        Relevant Orders   DG Shoulder Right   Right foot pain    Xray ordered.        Relevant Orders   DG Foot Complete Right   Class 1 obesity due to excess calories with serious comorbidity and body mass index (BMI) of 32.0 to 32.9 in adult    Recommend continue to work on eating healthy diet and exercise.        Other Visit Diagnoses     Essential hypertension       Relevant Orders   CBC With Diff/Platelet   Comprehensive metabolic panel   Gastro-esophageal reflux disease  without esophagitis       Obstructive sleep apnea syndrome       Vitamin D deficiency         .  No orders of the defined types were placed in this encounter.   Orders Placed This Encounter  Procedures   DG Shoulder Right   DG Foot Complete Right   CBC With Diff/Platelet   Comprehensive metabolic panel   Lipid panel   Hemoglobin A1c   TSH     Follow-up: Return in about 3 months (around 02/19/2022) for fasting.  An After Visit Summary was printed and given to the patient.   I,Lauren M Auman,acting as a scribe for [name], MD.,have documented all relevant documentation on the behalf of [name], MD,as directed by  [name], MD while in the presence of [name], MD.    [name], MD [place] [phone]

## 2021-11-19 NOTE — Assessment & Plan Note (Signed)
X-ray ordered.

## 2021-11-19 NOTE — Assessment & Plan Note (Addendum)
Well controlled.  No changes to medicines.  Continue to work on eating a healthy diet and exercise.  Labs drawn today.  Zetia 10 mg, Fish Oil 1000 mg take 1 capsule twice daily.

## 2021-11-19 NOTE — Assessment & Plan Note (Signed)
Continue CPAP.  

## 2021-11-19 NOTE — Assessment & Plan Note (Signed)
Recommend continue to work on eating healthy diet and exercise.  

## 2021-11-20 ENCOUNTER — Other Ambulatory Visit: Payer: Self-pay

## 2021-11-20 DIAGNOSIS — E1169 Type 2 diabetes mellitus with other specified complication: Secondary | ICD-10-CM

## 2021-11-20 LAB — CBC WITH DIFF/PLATELET
Basophils Absolute: 0 10*3/uL (ref 0.0–0.2)
Basos: 1 %
EOS (ABSOLUTE): 0 10*3/uL (ref 0.0–0.4)
Eos: 1 %
Hematocrit: 37.1 % (ref 34.0–46.6)
Hemoglobin: 12.6 g/dL (ref 11.1–15.9)
Immature Grans (Abs): 0 10*3/uL (ref 0.0–0.1)
Immature Granulocytes: 1 %
Lymphocytes Absolute: 1.7 10*3/uL (ref 0.7–3.1)
Lymphs: 34 %
MCH: 30.6 pg (ref 26.6–33.0)
MCHC: 34 g/dL (ref 31.5–35.7)
MCV: 90 fL (ref 79–97)
Monocytes Absolute: 0.4 10*3/uL (ref 0.1–0.9)
Monocytes: 8 %
Neutrophils Absolute: 2.8 10*3/uL (ref 1.4–7.0)
Neutrophils: 55 %
Platelets: 309 10*3/uL (ref 150–450)
RBC: 4.12 x10E6/uL (ref 3.77–5.28)
RDW: 12.7 % (ref 11.7–15.4)
WBC: 5.1 10*3/uL (ref 3.4–10.8)

## 2021-11-20 LAB — COMPREHENSIVE METABOLIC PANEL
ALT: 16 IU/L (ref 0–32)
AST: 22 IU/L (ref 0–40)
Albumin/Globulin Ratio: 1.7 (ref 1.2–2.2)
Albumin: 4.3 g/dL (ref 3.7–4.7)
Alkaline Phosphatase: 51 IU/L (ref 44–121)
BUN/Creatinine Ratio: 13 (ref 12–28)
BUN: 11 mg/dL (ref 8–27)
Bilirubin Total: 0.3 mg/dL (ref 0.0–1.2)
CO2: 25 mmol/L (ref 20–29)
Calcium: 9.4 mg/dL (ref 8.7–10.3)
Chloride: 102 mmol/L (ref 96–106)
Creatinine, Ser: 0.82 mg/dL (ref 0.57–1.00)
Globulin, Total: 2.5 g/dL (ref 1.5–4.5)
Glucose: 124 mg/dL — ABNORMAL HIGH (ref 70–99)
Potassium: 4.1 mmol/L (ref 3.5–5.2)
Sodium: 140 mmol/L (ref 134–144)
Total Protein: 6.8 g/dL (ref 6.0–8.5)
eGFR: 74 mL/min/{1.73_m2} (ref >59)

## 2021-11-20 LAB — LIPID PANEL
Chol/HDL Ratio: 3 ratio (ref 0.0–4.4)
Cholesterol, Total: 159 mg/dL (ref 100–199)
HDL: 53 mg/dL (ref >39)
LDL Chol Calc (NIH): 86 mg/dL (ref 0–99)
Triglycerides: 108 mg/dL (ref 0–149)
VLDL Cholesterol Cal: 20 mg/dL (ref 5–40)

## 2021-11-20 LAB — TSH: TSH: 1.37 u[IU]/mL (ref 0.450–4.500)

## 2021-11-20 LAB — HEMOGLOBIN A1C
Est. average glucose Bld gHb Est-mCnc: 160 mg/dL
Hgb A1c MFr Bld: 7.2 % — ABNORMAL HIGH (ref 4.8–5.6)

## 2021-11-20 LAB — CARDIOVASCULAR RISK ASSESSMENT

## 2021-11-21 ENCOUNTER — Other Ambulatory Visit: Payer: Self-pay

## 2021-11-21 MED ORDER — ONETOUCH ULTRA VI STRP: ORAL_STRIP | 12 refills | Status: DC

## 2021-11-21 MED ORDER — ONETOUCH ULTRASOFT LANCETS MISC: 12 refills | Status: DC

## 2021-11-21 MED ORDER — ONETOUCH ULTRA 2 W/DEVICE KIT: PACK | 0 refills | Status: DC

## 2021-11-28 DIAGNOSIS — Z87442 Personal history of urinary calculi: Secondary | ICD-10-CM | POA: Diagnosis not present

## 2021-11-28 DIAGNOSIS — M47816 Spondylosis without myelopathy or radiculopathy, lumbar region: Secondary | ICD-10-CM | POA: Diagnosis not present

## 2021-11-28 DIAGNOSIS — J45909 Unspecified asthma, uncomplicated: Secondary | ICD-10-CM | POA: Diagnosis not present

## 2021-11-28 DIAGNOSIS — Z043 Encounter for examination and observation following other accident: Secondary | ICD-10-CM | POA: Diagnosis not present

## 2021-11-28 DIAGNOSIS — Z794 Long term (current) use of insulin: Secondary | ICD-10-CM | POA: Diagnosis not present

## 2021-11-28 DIAGNOSIS — Z7982 Long term (current) use of aspirin: Secondary | ICD-10-CM | POA: Diagnosis not present

## 2021-11-28 DIAGNOSIS — M25511 Pain in right shoulder: Secondary | ICD-10-CM | POA: Diagnosis not present

## 2021-11-28 DIAGNOSIS — M19011 Primary osteoarthritis, right shoulder: Secondary | ICD-10-CM | POA: Diagnosis not present

## 2021-11-28 DIAGNOSIS — G4489 Other headache syndrome: Secondary | ICD-10-CM | POA: Diagnosis not present

## 2021-11-28 DIAGNOSIS — S199XXA Unspecified injury of neck, initial encounter: Secondary | ICD-10-CM | POA: Diagnosis not present

## 2021-11-28 DIAGNOSIS — M542 Cervicalgia: Secondary | ICD-10-CM | POA: Diagnosis not present

## 2021-11-28 DIAGNOSIS — M47812 Spondylosis without myelopathy or radiculopathy, cervical region: Secondary | ICD-10-CM | POA: Diagnosis not present

## 2021-11-28 DIAGNOSIS — S0990XA Unspecified injury of head, initial encounter: Secondary | ICD-10-CM | POA: Diagnosis not present

## 2021-11-28 DIAGNOSIS — Z79899 Other long term (current) drug therapy: Secondary | ICD-10-CM | POA: Diagnosis not present

## 2021-11-28 DIAGNOSIS — I10 Essential (primary) hypertension: Secondary | ICD-10-CM | POA: Diagnosis not present

## 2021-11-28 DIAGNOSIS — E119 Type 2 diabetes mellitus without complications: Secondary | ICD-10-CM | POA: Diagnosis not present

## 2021-11-28 DIAGNOSIS — Z981 Arthrodesis status: Secondary | ICD-10-CM | POA: Diagnosis not present

## 2021-11-28 DIAGNOSIS — R6889 Other general symptoms and signs: Secondary | ICD-10-CM | POA: Diagnosis not present

## 2021-11-28 DIAGNOSIS — S0003XA Contusion of scalp, initial encounter: Secondary | ICD-10-CM | POA: Diagnosis not present

## 2021-11-28 DIAGNOSIS — E7800 Pure hypercholesterolemia, unspecified: Secondary | ICD-10-CM | POA: Diagnosis not present

## 2021-11-28 DIAGNOSIS — S0083XA Contusion of other part of head, initial encounter: Secondary | ICD-10-CM | POA: Diagnosis not present

## 2021-12-05 ENCOUNTER — Ambulatory Visit (INDEPENDENT_AMBULATORY_CARE_PROVIDER_SITE_OTHER): Payer: Medicare Other | Admitting: Family Medicine

## 2021-12-05 ENCOUNTER — Encounter: Payer: Self-pay | Admitting: Family Medicine

## 2021-12-05 VITALS — BP 122/68 | HR 63 | Temp 97.2°F | Ht <= 58 in | Wt 151.0 lb

## 2021-12-05 DIAGNOSIS — M542 Cervicalgia: Secondary | ICD-10-CM | POA: Diagnosis not present

## 2021-12-05 DIAGNOSIS — W01198A Fall on same level from slipping, tripping and stumbling with subsequent striking against other object, initial encounter: Secondary | ICD-10-CM

## 2021-12-05 DIAGNOSIS — G8929 Other chronic pain: Secondary | ICD-10-CM | POA: Diagnosis not present

## 2021-12-05 DIAGNOSIS — S0003XA Contusion of scalp, initial encounter: Secondary | ICD-10-CM

## 2021-12-05 DIAGNOSIS — M25511 Pain in right shoulder: Secondary | ICD-10-CM | POA: Diagnosis not present

## 2021-12-05 MED ORDER — ACYCLOVIR 400 MG PO TABS: 400.0000 mg | ORAL_TABLET | Freq: Two times a day (BID) | ORAL | 3 refills | Status: DC | PRN

## 2021-12-05 NOTE — Patient Instructions (Signed)
Neck mri and shoulder mri ordered.  Foot xray ordered.

## 2021-12-05 NOTE — Progress Notes (Signed)
Subjective:  Patient ID: Mindy Ingram, female    DOB: [birth year]  Age: 76 y.o. MRN: [id number]  Chief Complaint  Patient presents with   Hospitalization Follow-up   Follow up Hospitalization Patient presents for ED follow-up.  She was seen in the emergency department on November 28, 2021 at [facility].  The patient tripped over a small statue in her yard and struck the right side of her head on a metal table.  She developed a hematoma on the right side of her forehead and a nosebleed.  She also developed right-sided neck and shoulder pain with the fall.  Denied loss of consciousness.  She was able to get up and walk after the fall but did call EMS to bring her to the emergency department.  Patient had a large hematoma on the right upper forehead.  Right shoulder range of motion abnormal due to pain. Right shoulder x-ray was normal. Facial x-ray showed no nasal fracture. CT scan of the brain and C-spine showed a high left frontal scalp contusion without acute calvarial fracture.  Severe multilevel degenerative changes in her neck.  But no fractures.  It was suggested in the follow-up MRI might better better assess if patient's been having neck or radicular symptoms.  Patient was discharged with a sling to use for comfort recommended cool compresses to forehead and muscle soreness and to follow-up here..   Current Outpatient Medications on File Prior to Visit  Medication Sig Dispense Refill   albuterol (VENTOLIN HFA) 108 (90 Base) MCG/ACT inhaler Inhale 2 puffs into the lungs every 6 (six) hours as needed for wheezing or shortness of breath.     amLODipine (NORVASC) 5 MG tablet Take 0.5 tablets (2.5 mg total) by mouth daily as needed. 90 tablet 0   aspirin EC 81 MG tablet Take 81 mg by mouth daily.     azelastine (ASTELIN) 0.1 % nasal spray Place 1 spray into both nostrils 2 (two) times daily as needed for allergies or rhinitis.     Blood Glucose Monitoring Suppl (ONE TOUCH ULTRA 2) w/Device  KIT Use as instructed five times daily. E11.69 1 kit 0   cetirizine (ZYRTEC) 10 MG tablet Take 10 mg by mouth daily as needed for allergies.     Cholecalciferol (VITAMIN D PO) Take 2,000 Units by mouth daily.      CRESTOR 10 MG tablet TAKE 1 TABLET BY MOUTH 3 TIMES A WEEK 36 tablet 0   Elderberry 575 MG/5ML SYRP Take 5 mLs by mouth daily.     ezetimibe (ZETIA) 10 MG tablet Take 1 tablet (10 mg total) by mouth daily. 90 tablet 1   fluticasone (FLONASE) 50 MCG/ACT nasal spray Place 1-2 sprays into both nostrils daily.     glucose blood (ONETOUCH ULTRA) test strip Use as instructed five times daily. 100 each 12   ibuprofen (ADVIL) 800 MG tablet Take 800 mg by mouth every 8 (eight) hours as needed for headache.     Lancets (ONETOUCH ULTRASOFT) lancets Use as instructed five times daily. 100 each 12   losartan-hydrochlorothiazide (HYZAAR) 100-25 MG tablet TAKE 1 TABLET BY MOUTH DAILY 90 tablet 1   metFORMIN (GLUCOPHAGE-XR) 500 MG 24 hr tablet TAKE 1 TABLET(500 MG) BY MOUTH TWICE DAILY 180 tablet 2   methocarbamol (ROBAXIN) 500 MG tablet Take 500 mg by mouth 2 (two) times daily.     metoprolol (TOPROL-XL) 200 MG 24 hr tablet TAKE 1 TABLET BY MOUTH EVERY DAY 90 tablet 1  Multiple Vitamins-Minerals (COMPLETE ENERGY) TABS Take 1 tablet by mouth daily.     nitroGLYCERIN (NITROSTAT) 0.4 MG SL tablet Place 1 tablet (0.4 mg total) under the tongue every 5 (five) minutes as needed for chest pain. 30 tablet 4   Omega-3 Fatty Acids (FISH OIL) 1000 MG CAPS Take 1 capsule by mouth daily.     omeprazole (PRILOSEC) 20 MG capsule TAKE 1 CAPSULE(20 MG) BY MOUTH DAILY 90 capsule 1   traMADol (ULTRAM) 50 MG tablet Take 50 mg by mouth 2 (two) times daily as needed for pain.     triamcinolone ointment (KENALOG) 0.1 % Apply 1 application topically 2 (two) times daily. 80 g 0   Current Facility-Administered Medications on File Prior to Visit  Medication Dose Route Frequency Provider Last Rate Last Admin   triamcinolone  acetonide (KENALOG-40) injection 60 mg  60 mg Intramuscular Once [name], PA-C       Past Medical History:  Diagnosis Date   Acquired hammer toe of right foot 08/11/2017   Allergic asthma, mild intermittent, uncomplicated 07/05/2692   Office Spirometry 04/03/2015- WNL FVC 2.14/110%, FEV1 1.94/128%, FEV1/FVC 0.91  Office spirometry- 11/04/16--by allergy office- FEV1/FVC 0.77 WNL   Alopecia 06/25/2020   Asthma    Asthmatic bronchitis, mild intermittent, with acute exacerbation 10/11/2007   Qualifier: Diagnosis of  By: Annamaria Boots MD, [place] D    Bilateral impacted cerumen 07/01/2018   Body mass index (BMI) 30.0-30.9, adult 07/10/2021   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   Chronic back pain    Contusion of head, initial encounter 03/18/2021   [place] of toe 06/25/2020   Costochondritis 06/25/2020   Cramp in lower leg associated with rest 06/25/2020   Degeneration of lumbosacral intervertebral disc 01/26/2012   DJD (degenerative joint disease)    DM type 2 with diabetic mixed hyperlipidemia ([place]) 04/08/2021   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   Hyperlipidemia 06/25/2020   Lateral epicondylitis 06/25/2020   Light headedness 03/18/2021   Low back pain 01/26/2012   Menopause 06/25/2020   Multiple benign melanocytic nevi 06/25/2020   Neck muscle spasm 04/26/2021   Obesity 07/10/2021   OSA (obstructive sleep apnea)    cpap setting of 10   Other long term (current) drug therapy 06/25/2020   Seasonal and perennial allergic rhinitis 10/17/2007   Allergy vaccine 2002- dc'd   Shoulder joint pain 06/25/2020   Sleep apnea    Type II or unspecified type diabetes mellitus without mention of complication, not stated as uncontrolled    Upper airway cough syndrome  09/24/2015   Vitamin D deficiency 06/25/2020   Past Surgical History:  Procedure Laterality Date   [place]  2013   lower back with plates and screws   benign lump right axilla     [place]   at [facility], slight limitation with turning neck   CHOLECYSTECTOMY  1995   EUS N/A 07/21/2013   Procedure: UPPER ENDOSCOPIC ULTRASOUND (EUS) LINEAR;  Surgeon: Milus Banister, MD;  Location: [facility];  Service: Endoscopy;  Laterality: N/A;   FOOT SURGERY     2007 - right great toe, 2000 - right fifth digit .  NEUROPLASTY / TRANSPOSITION MEDIAN NERVE AT [place]   PARTIAL COLECTOMY  1995   benign adhesions   PARTIAL HYSTERECTOMY  1980s.   abdominal   right foot surgery  2000   right great toe with artificial bone inserted   right knee arthroscopy Right 02/2017    Family History  Problem Relation Age of Onset   Heart disease Mother    Prostate cancer Father    Heart attack Sister    Breast cancer Maternal Aunt    Prostate cancer Paternal Uncle    Allergies Neg Hx    Asthma Neg Hx    Eczema Neg Hx    Immunodeficiency Neg Hx    Social History   Socioeconomic History   Marital status: Widowed    Spouse name: Not on file   Number of children: 1   Years of education: Not on file   Highest education level: Not on file  Occupational History   Occupation: Retired  Tobacco Use   Smoking status: Never   Smokeless tobacco: Never  Vaping Use   Vaping Use: Never used  Substance and Sexual Activity   Alcohol use: No   Drug use: No   Sexual activity: Not on file  Other Topics Concern   Not on file  Social History Narrative   Not on file   Social Determinants of Health   Financial Resource Strain: Low Risk  (07/03/2021)   Overall Financial Resource Strain (CARDIA)    Difficulty of Paying Living Expenses: Not hard at all  Food Insecurity: No Food Insecurity (11/21/2020)   Hunger  Vital Sign    Worried About Running Out of Food in the Last Year: Never true    [place] in the Last Year: Never true  Transportation Needs: No Transportation Needs (07/03/2021)   PRAPARE - Hydrologist (Medical): No    Lack of Transportation (Non-Medical): No  Physical Activity: Not on file  Stress: Not on file  Social Connections: Not on file    Review of Systems  Constitutional:  Negative for appetite change, fatigue and fever.  HENT:  Negative for congestion, ear pain, sinus pressure and sore throat.   Respiratory:  Negative for cough, chest tightness, shortness of breath and wheezing.   Cardiovascular:  Negative for chest pain and palpitations.  Gastrointestinal:  Negative for abdominal pain, constipation, diarrhea, nausea and vomiting.  Genitourinary:  Negative for dysuria and hematuria.  Musculoskeletal:  Positive for myalgias (Right shoulder pain/Right knee pain). Negative for arthralgias, back pain and joint swelling.  Skin:  Negative for rash.  Neurological:  Negative for dizziness, weakness and headaches.  Psychiatric/Behavioral:  Negative for dysphoric mood. The patient is not nervous/anxious.      Objective:  BP 122/68 (BP Location: Left Arm, Patient Position: Sitting)   Pulse 63   Temp (!) 97.2 F (36.2 C) (Temporal)   Ht 4' 9"  (1.448 m)   Wt 151 lb (68.5 kg)   SpO2 100%   BMI 32.68 kg/m      12/05/2021    2:51 PM 11/19/2021    9:17 AM 08/22/2021   11:11 AM  BP/Weight  Systolic BP 935 701 779  Diastolic BP 68 72 74  Wt. (Lbs) 151 151 148.4  BMI 32.68 kg/m2 32.68 kg/m2 32.11 kg/m2    Physical Exam Vitals reviewed.  Constitutional:      Appearance: Normal appearance. She is normal weight.  Cardiovascular:  Rate and Rhythm: Normal rate and regular rhythm.     Pulses: Normal pulses.     Heart sounds: Normal heart sounds.  Pulmonary:     Effort: Pulmonary effort is normal.     Breath sounds: Normal breath sounds.   Abdominal:     General: Abdomen is flat. Bowel sounds are normal.     Palpations: Abdomen is soft.  Skin:    Comments: Right upper forehead bruised, swollen.   Neurological:     Mental Status: She is alert and oriented to person, place, and time.  Psychiatric:        Mood and Affect: Mood normal.        Behavior: Behavior normal.     Diabetic Foot Exam - Simple   No data filed      Lab Results  Component Value Date   WBC 5.1 11/19/2021   HGB 12.6 11/19/2021   HCT 37.1 11/19/2021   PLT 309 11/19/2021   GLUCOSE 124 (H) 11/19/2021   CHOL 159 11/19/2021   TRIG 108 11/19/2021   HDL 53 11/19/2021   LDLCALC 86 11/19/2021   ALT 16 11/19/2021   AST 22 11/19/2021   NA 140 11/19/2021   K 4.1 11/19/2021   CL 102 11/19/2021   CREATININE 0.82 11/19/2021   BUN 11 11/19/2021   CO2 25 11/19/2021   TSH 1.370 11/19/2021   HGBA1C 7.2 (H) 11/19/2021   MICROALBUR 10 12/25/2020      Assessment & Plan:   Problem List Items Addressed This Visit       Other   Right temporal frontal scalp contusions    Healing well.      Chronic right shoulder pain - Primary    Order MRI shoulder.       Relevant Medications   methocarbamol (ROBAXIN) 500 MG tablet   Other Relevant Orders   MR Shoulder Right Wo Contrast   Neck pain    Order mri of neck. Abnormal ct scan.      Relevant Orders   MR NECK WO/W CM   Fall on same level from slipping, tripping and stumbling with subsequent striking against other object, initial encounter  .  Meds ordered this encounter  Medications   acyclovir (ZOVIRAX) 400 MG tablet    Sig: Take 1 tablet (400 mg total) by mouth 2 (two) times daily as needed (outbreak).    Dispense:  100 tablet    Refill:  3    Please ship upon filling rx.    Orders Placed This Encounter  Procedures   MR Shoulder Right Wo Contrast   MR NECK WO/W CM     Follow-up: Return if symptoms worsen or fail to improve.  An After Visit Summary was printed and given to the  patient.  I,Lauren M Auman,acting as a scribe for [name], MD.,have documented all relevant documentation on the behalf of [name], MD,as directed by  [name], MD while in the presence of [name], MD.    [name], MD [place] [phone]

## 2021-12-10 ENCOUNTER — Telehealth: Payer: Medicare Other

## 2021-12-12 DIAGNOSIS — M542 Cervicalgia: Secondary | ICD-10-CM | POA: Insufficient documentation

## 2021-12-12 HISTORY — DX: Cervicalgia: M54.2

## 2021-12-15 DIAGNOSIS — W01198A Fall on same level from slipping, tripping and stumbling with subsequent striking against other object, initial encounter: Secondary | ICD-10-CM | POA: Insufficient documentation

## 2021-12-15 NOTE — Assessment & Plan Note (Signed)
Order mri of neck. Abnormal ct scan.

## 2021-12-15 NOTE — Assessment & Plan Note (Signed)
Order MRI shoulder.

## 2021-12-15 NOTE — Assessment & Plan Note (Signed)
Healing well.

## 2021-12-16 ENCOUNTER — Other Ambulatory Visit: Payer: Self-pay | Admitting: Family Medicine

## 2021-12-19 ENCOUNTER — Telehealth: Payer: Self-pay

## 2021-12-19 ENCOUNTER — Other Ambulatory Visit: Payer: Self-pay | Admitting: Family Medicine

## 2021-12-19 DIAGNOSIS — S0083XA Contusion of other part of head, initial encounter: Secondary | ICD-10-CM

## 2021-12-19 NOTE — Telephone Encounter (Signed)
Patient informed and patient wants to go forth with trying to schedule a STAT CT scan of the brain.

## 2021-12-20 ENCOUNTER — Ambulatory Visit
Admission: RE | Admit: 2021-12-20 | Discharge: 2021-12-20 | Disposition: A | Discharge status: Home / Self Care | Payer: Medicare Other | Source: Ambulatory Visit | Attending: Family Medicine | Admitting: Family Medicine

## 2021-12-20 ENCOUNTER — Other Ambulatory Visit: Payer: Medicare Other

## 2021-12-20 ENCOUNTER — Telehealth: Payer: Self-pay

## 2021-12-20 DIAGNOSIS — M79671 Pain in right foot: Secondary | ICD-10-CM | POA: Diagnosis not present

## 2021-12-20 DIAGNOSIS — S0083XA Contusion of other part of head, initial encounter: Secondary | ICD-10-CM

## 2021-12-20 DIAGNOSIS — G8929 Other chronic pain: Secondary | ICD-10-CM

## 2021-12-20 DIAGNOSIS — S0990XA Unspecified injury of head, initial encounter: Secondary | ICD-10-CM | POA: Diagnosis not present

## 2021-12-20 NOTE — Telephone Encounter (Signed)
Mindy Ingram was notified by Joice Lofts of the repeat CT of the brain.  She is alert and oriented this morning but she had questions about repeating the scan.  She is willing to proceed with the scan as recommended per Dr. Sedalia Muta.

## 2021-12-21 ENCOUNTER — Ambulatory Visit
Admission: RE | Admit: 2021-12-21 | Discharge: 2021-12-21 | Disposition: A | Discharge status: Home / Self Care | Payer: Medicare Other | Source: Ambulatory Visit | Attending: Family Medicine | Admitting: Family Medicine

## 2021-12-21 DIAGNOSIS — M542 Cervicalgia: Secondary | ICD-10-CM

## 2021-12-21 DIAGNOSIS — M4802 Spinal stenosis, cervical region: Secondary | ICD-10-CM | POA: Diagnosis not present

## 2021-12-21 DIAGNOSIS — M19011 Primary osteoarthritis, right shoulder: Secondary | ICD-10-CM | POA: Diagnosis not present

## 2021-12-21 DIAGNOSIS — M50223 Other cervical disc displacement at C6-C7 level: Secondary | ICD-10-CM | POA: Diagnosis not present

## 2021-12-21 DIAGNOSIS — M4803 Spinal stenosis, cervicothoracic region: Secondary | ICD-10-CM | POA: Diagnosis not present

## 2021-12-21 DIAGNOSIS — S46011A Strain of muscle(s) and tendon(s) of the rotator cuff of right shoulder, initial encounter: Secondary | ICD-10-CM | POA: Diagnosis not present

## 2021-12-21 DIAGNOSIS — M25411 Effusion, right shoulder: Secondary | ICD-10-CM | POA: Diagnosis not present

## 2021-12-21 DIAGNOSIS — G8929 Other chronic pain: Secondary | ICD-10-CM

## 2021-12-21 DIAGNOSIS — M5021 Other cervical disc displacement,  high cervical region: Secondary | ICD-10-CM | POA: Diagnosis not present

## 2021-12-21 MED ORDER — GADOBENATE DIMEGLUMINE 529 MG/ML IV SOLN
14.0000 mL | Freq: Once | INTRAVENOUS | Status: AC | PRN
  Administered 2021-12-21: 14 mL via INTRAVENOUS

## 2021-12-23 ENCOUNTER — Other Ambulatory Visit: Payer: Self-pay

## 2021-12-23 DIAGNOSIS — G8929 Other chronic pain: Secondary | ICD-10-CM

## 2021-12-23 DIAGNOSIS — M79671 Pain in right foot: Secondary | ICD-10-CM

## 2021-12-27 ENCOUNTER — Other Ambulatory Visit: Payer: Self-pay | Admitting: Family Medicine

## 2022-01-01 DIAGNOSIS — M25511 Pain in right shoulder: Secondary | ICD-10-CM | POA: Diagnosis not present

## 2022-01-09 DIAGNOSIS — G9589 Other specified diseases of spinal cord: Secondary | ICD-10-CM | POA: Diagnosis not present

## 2022-01-09 DIAGNOSIS — M545 Low back pain, unspecified: Secondary | ICD-10-CM | POA: Diagnosis not present

## 2022-01-09 DIAGNOSIS — M5412 Radiculopathy, cervical region: Secondary | ICD-10-CM | POA: Diagnosis not present

## 2022-01-09 DIAGNOSIS — M546 Pain in thoracic spine: Secondary | ICD-10-CM | POA: Diagnosis not present

## 2022-01-09 DIAGNOSIS — M4712 Other spondylosis with myelopathy, cervical region: Secondary | ICD-10-CM | POA: Diagnosis not present

## 2022-01-09 DIAGNOSIS — M47816 Spondylosis without myelopathy or radiculopathy, lumbar region: Secondary | ICD-10-CM | POA: Diagnosis not present

## 2022-01-09 DIAGNOSIS — M47814 Spondylosis without myelopathy or radiculopathy, thoracic region: Secondary | ICD-10-CM | POA: Diagnosis not present

## 2022-01-09 DIAGNOSIS — M5030 Other cervical disc degeneration, unspecified cervical region: Secondary | ICD-10-CM | POA: Diagnosis not present

## 2022-01-09 DIAGNOSIS — M4726 Other spondylosis with radiculopathy, lumbar region: Secondary | ICD-10-CM | POA: Diagnosis not present

## 2022-01-13 DIAGNOSIS — M75101 Unspecified rotator cuff tear or rupture of right shoulder, not specified as traumatic: Secondary | ICD-10-CM | POA: Diagnosis not present

## 2022-01-13 DIAGNOSIS — M25511 Pain in right shoulder: Secondary | ICD-10-CM | POA: Diagnosis not present

## 2022-01-15 ENCOUNTER — Ambulatory Visit (INDEPENDENT_AMBULATORY_CARE_PROVIDER_SITE_OTHER): Payer: Medicare Other

## 2022-01-15 DIAGNOSIS — E782 Mixed hyperlipidemia: Secondary | ICD-10-CM

## 2022-01-15 DIAGNOSIS — E1169 Type 2 diabetes mellitus with other specified complication: Secondary | ICD-10-CM

## 2022-01-15 DIAGNOSIS — I10 Essential (primary) hypertension: Secondary | ICD-10-CM

## 2022-01-15 DIAGNOSIS — M75101 Unspecified rotator cuff tear or rupture of right shoulder, not specified as traumatic: Secondary | ICD-10-CM | POA: Diagnosis not present

## 2022-01-15 DIAGNOSIS — M25511 Pain in right shoulder: Secondary | ICD-10-CM | POA: Diagnosis not present

## 2022-01-15 NOTE — Patient Instructions (Signed)
Visit Information   Goals Addressed   None    Patient Care Plan: CCM Pharmacy Care Plan     Problem Identified: dm,htn, hld   Priority: High  Onset Date: 11/22/2020     Long-Range Goal: Disease State Management   Start Date: 11/22/2020  Expected End Date: 11/22/2021  Recent Progress: On track  Priority: High  Note:   Current Barriers:  Unable to independently afford treatment regimen  Pharmacist Clinical Goal(s):  Patient will verbalize ability to afford treatment regimen through collaboration with PharmD and provider.   Interventions: 1:1 collaboration with [name], MD regarding development and update of comprehensive plan of care as evidenced by provider attestation and co-signature Inter-disciplinary care team collaboration (see longitudinal plan of care) Comprehensive medication review performed; medication list updated in electronic medical record  Hypertension (BP goal <140/80) BP Readings from Last 3 Encounters:  12/05/21 122/68  11/19/21 134/72  08/22/21 112/74  -Controlled -Current treatment: amlodipine 2.5 mg daily Appropriate, Query effective Losartan-hydrochlorothiazide 100/25 mg daily Appropriate, Query effective Metoprolol succinate 200 mg daily Appropriate, Query effective,  -Medications previously tried: none reported  -Current home readings:  28 December 2021: (States tends to be higher in AM before she takes her meds) 119/69 113/62 125/65 126/66 136/76 140/79 137/79 01/14/22: 152/82 01/15/22: 162/87, 127/77 -Current dietary habits: eats breakfast and supper mainly.  -Current exercise habits: Was previously in karate and exercise class. She hopes to rejoin the class soon. Had stopped prior to [place].  -Affirmshypotensive/hypertensive symptoms -Educated on BP goals and benefits of medications for prevention of heart attack, stroke and kidney damage; Daily salt intake goal < 2300 mg; Exercise goal of 150 minutes per week; Importance of home blood  pressure monitoring; July 2023: BP is elevated. However, patient states she becomes dizzy at a BP around 867'E-720 systolic. Patient is constantly taking half a pill or skipping doses to prevent herself from becoming dizzy (She had a sever fall last month). She is taking Metoprolol and Losartan at night and Amlodipine in the AM. Counseled to take Amlodipine at night to prevent hypotension during the day and because her BP tends to be fine during the day but will be too high in AM when she tests her BP first thing upon waking/before pills.Will have patient write BP daily and write how/when she took meds and f/u next month. Will also let PCP know  Hyperlipidemia: (LDL goal < 100) The 10-year ASCVD risk score (Arnett DK, et al., 2019) is: 28.3%   Values used to calculate the score:     Age: 28 years     Sex: Female     Is Non-Hispanic African American: Yes     Diabetic: Yes     Tobacco smoker: No     Systolic Blood Pressure: 947 mmHg     Is BP treated: Yes     HDL Cholesterol: 53 mg/dL     Total Cholesterol: 159 mg/dL Lab Results  Component Value Date   CHOL 159 11/19/2021   CHOL 222 (H) 08/06/2021   CHOL 162 04/08/2021   Lab Results  Component Value Date   HDL 53 11/19/2021   HDL 52 08/06/2021   HDL 54 04/08/2021   Lab Results  Component Value Date   LDLCALC 86 11/19/2021   LDLCALC 141 (H) 08/06/2021   LDLCALC 90 04/08/2021   Lab Results  Component Value Date   TRIG 108 11/19/2021   TRIG 160 (H) 08/06/2021   TRIG 101 04/08/2021   Lab Results  Component Value Date   CHOLHDL 3.0 11/19/2021   CHOLHDL 4.3 08/06/2021   CHOLHDL 3.0 04/08/2021  No results found for: "LDLDIRECT" Last vitamin D No results found for: "25OHVITD2", "25OHVITD3", "VD25OH" -Controlled -Current treatment: aspirin ec 81 mg daily Appropriate, Effective, Safe, Accessible Crestor (Brand) 10 mg twice weekly Query Appropriate Ezetimibe 23m Query Appropriate, Effective, Safe, Accessible -Medications  previously tried: brand name Crestor, atorvastatin -Current dietary patterns: reports breakfast meat and hot dogs  -Current exercise habits: was previously involved in karate and exercise class [place] Cholesterol goals;  Benefits of statin for ASCVD risk reduction; Importance of limiting foods high in cholesterol; Exercise goal of 150 minutes per week; Jan 2023: Recommended considering Crestor 10 mg three times weekly. Patient reports she can tolerate brand name Crestor but the generic leaves her achy. Pharmacist to request Dispense as Written for brand name Crestor.  Assessed patient finances. Patient approved for [employer] cover cost of Crestor.  July 2023: Patient states she stopped Rosuvastatin once she started Ezetimibe. Still on medslist, will ask PCP about removing.  Diabetes (A1c goal <8%) Lab Results  Component Value Date   HGBA1C 7.2 (H) 11/19/2021   HGBA1C 6.9 (H) 08/06/2021   HGBA1C 6.8 (H) 04/08/2021   Lab Results  Component Value Date   MICROALBUR 10 12/25/2020   LDLCALC 86 11/19/2021   CREATININE 0.82 11/19/2021   Lab Results  Component Value Date   NA 140 11/19/2021   K 4.1 11/19/2021   CREATININE 0.82 11/19/2021   EGFR 74 11/19/2021   GFRNONAA >60 09/14/2008   GLUCOSE 124 (H) 11/19/2021   Lab Results  Component Value Date   WBC 5.1 11/19/2021   HGB 12.6 11/19/2021   HCT 37.1 11/19/2021   MCV 90 11/19/2021   PLT 309 11/19/2021  -Controlled -Current medications: Metformin xr 500 mg twice daily Appropriate, Effective, Safe, Accessible One touch verio test strips twice daily  Lancets daily prior to meal  -Medications previously tried: Metformin 5047m3/day (ADR's) -Current home glucose readings fasting glucose:  May 2022: 120, 121, 136, 156, 124, 128, 116, 128, 164 (birthday cake), 143, 168 (birthday cake) Jan 2023: Didn't have readings on her July 2023: Didn't have readings on her -Denies hypoglycemic/hyperglycemic  symptoms -Current meal patterns:  Late breakfast or early lunch: egg, bacon or sausage with grits or pancake occasionally (a few times a month). [place] Claproissant once a week as a treat.   dinner: dines out mainly.  snacks:  more birthday cake lately since celebrating all month long),  drinks: unsweet tea, coffee  -Current exercise: limited since not attending exercise class after COVID -Educated on A1c and blood sugar goals; Complications of diabetes including kidney damage, retinal damage, and cardiovascular disease; Exercise goal of 150 minutes per week; Benefits of routine self-monitoring of blood sugar; Carbohydrate counting and/or plate method -Counseled to check feet daily and get yearly eye exams -Counseled on diet and exercise extensively Recommended to continue current medication Jan 2023: Patient doesn't want to increase Metformin to 3/day nor does she want to start Ozempic. Updated medslist and will let PCP know    Disc Degeneration (Goal: manage pain) -Pain Scale  -Jan 2023: Patient didn't specify, only said she is very content -Controlled -Current treatment  tramadol 50 mg bid prn Appropriate, Effective, Safe, Accessible Meloxicam 1520mD PRN Query Appropriate,  IBU 800m24mery Appropriate,  -Medications previously tried:N/A July 2023: IBU was on patients meds list and Meloxicam wasn't. Patient states she  has both but that she doesn't take both. Asked her which she prefers and that we should stick with one. She called back and said she prefers Meloxicam. Updated Medslist and once again counseled not to take together   Patient Goals/Self-Care Activities Patient will:  - take medications as prescribed focus on medication adherence by taking medication as needed check glucose daily, document, and provide at future appointments check blood pressure daily, document, and provide at future appointments target a minimum of 150 minutes of moderate intensity exercise  weekly engage in dietary modifications by limiting carbohydrates and fried/fatty foods. Encouraged lean protein, fruits and vegetables.   Follow Up Plan: Telephone follow up appointment with care management team member scheduled for: August 2023  [place] Con[place], [place].D. - [phone]      Ms. Mazer was given information about Chronic Care Management services today including:  CCM service includes personalized support from designated clinical staff supervised by her physician, including individualized plan of care and coordination with other care providers 24/7 contact phone numbers for assistance for urgent and routine care needs. Standard insurance, coinsurance, copays and deductibles apply for chronic care management only during months in which we provide at least 20 minutes of these services. Most insurances cover these services at 100%, however patients may be responsible for any copay, coinsurance and/or deductible if applicable. This service may help you avoid the need for more expensive face-to-face services. Only one practitioner may furnish and bill the service in a calendar month. The patient may stop CCM services at any time (effective at the end of the month) by phone call to the office staff.  Patient agreed to services and verbal consent obtained.   The patient verbalized understanding of instructions, educational materials, and care plan provided today and DECLINED offer to receive copy of patient instructions, educational materials, and care plan.  The pharmacy team will reach out to the patient again over the next 60 days.   Lane Hacker, [place]

## 2022-01-15 NOTE — Progress Notes (Signed)
Chronic Care Management Pharmacy Note  01/15/2022 Name:  Mindy Ingram MRN:  [id number] DOB:  1946/05/24   Summary: Very pleasant 76 year old female presents for f/u CCM visit. She just completed her BS in Theology at the age of 67. She was married at 19 and has 1 child with 5 grandchildren. In her free time, she loved taking Karate classes but when Covid started, she had to stop   Plan Updates:  Rosuvastatin is on patient's medslist but she states she was told to stop once starting Ezetimibe. Will ask PCP BP is elevated (See note). However, patient states she becomes dizzy at a BP around 563'O-756 systolic. Patient is constantly taking half a pill or skipping doses to prevent herself from becoming dizzy (She had a sever fall last month). She is taking Metoprolol and Losartan at night and Amlodipine in the AM. Counseled to take Amlodipine at night to prevent hypotension during the day and because her BP tends to be fine during the day but will be too high in AM when she tests her BP first thing upon waking/before pills. Will have patient write BP daily and write how/when she took meds and f/u next month. Will also let PCP know and defer to them for alternatives   Subjective: Mindy Ingram is an 76 y.o. year old female who is a primary patient of Cox, Kirsten, MD.  The CCM team was consulted for assistance with disease management and care coordination needs.    Engaged with patient by telephone for follow up visit in response to provider referral for pharmacy case management and/or care coordination services.   Consent to Services:  The patient was given the following information about Chronic Care Management services today, agreed to services, and gave verbal consent: 1. CCM service includes personalized support from designated clinical staff supervised by the primary care provider, including individualized plan of care and coordination with other care providers 2. 24/7 contact phone  numbers for assistance for urgent and routine care needs. 3. Service will only be billed when office clinical staff spend 20 minutes or more in a month to coordinate care. 4. Only one practitioner may furnish and bill the service in a calendar month. 5.The patient may stop CCM services at any time (effective at the end of the month) by phone call to the office staff. 6. The patient will be responsible for cost sharing (co-pay) of up to 20% of the service fee (after annual deductible is met). Patient agreed to services and consent obtained.  Patient Care Team: [name], MD as PCP - General (Family Medicine) Nehemiah Settle, MD (Gastroenterology) Hennie Duos, MD as Consulting Physician (Rheumatology) Sharyn Dross., DPM (Podiatry) Lane Hacker, [facility] (Pharmacist) Darleen Crocker, MD as Consulting Physician (Ophthalmology) Valente David, [place] (Optometry)  Recent office visits:  None   Recent consult visits:  None   Hospital visits:  None  Objective:  Lab Results  Component Value Date   CREATININE 0.82 11/19/2021   BUN 11 11/19/2021   GFRNONAA >60 09/14/2008   GFRAA  09/14/2008    >60        The eGFR has been calculated using the MDRD equation. This calculation has not been validated in all clinical situations. eGFR's persistently <60 mL/min signify possible Chronic Kidney Disease.   NA 140 11/19/2021   K 4.1 11/19/2021   CALCIUM 9.4 11/19/2021   CO2 25 11/19/2021   GLUCOSE 124 (H) 11/19/2021    Lab Results  Component Value Date/Time   HGBA1C 7.2 (H) 11/19/2021 10:04 AM   HGBA1C 6.9 (H) 08/06/2021 10:51 AM   MICROALBUR 10 12/25/2020 10:17 AM    Last diabetic Eye exam:  Lab Results  Component Value Date/Time   HMDIABEYEEXA No Retinopathy 01/01/2021 12:00 AM    Last diabetic Foot exam: No results found for: "HMDIABFOOTEX"   Lab Results  Component Value Date   CHOL 159 11/19/2021   HDL 53 11/19/2021   LDLCALC 86 11/19/2021   TRIG 108 11/19/2021    CHOLHDL 3.0 11/19/2021       Latest Ref Rng & Units 11/19/2021   10:04 AM 08/06/2021   10:51 AM 04/08/2021   11:38 AM  Hepatic Function  Total Protein 6.0 - 8.5 g/dL 6.8  7.2  6.7   Albumin 3.7 - 4.7 g/dL 4.3  4.7  4.6   AST 0 - 40 IU/L _0 ALT 0 - 32 IU/L _1 Alk Phosphatase 44 - 121 IU/L 51  47  47   Total Bilirubin 0.0 - 1.2 mg/dL 0.3  0.4  0.3     Lab Results  Component Value Date/Time   TSH 1.370 11/19/2021 10:04 AM   TSH 1.450 09/20/2020 10:21 AM       Latest Ref Rng & Units 11/19/2021   10:04 AM 08/06/2021   10:51 AM 04/08/2021   11:38 AM  CBC  WBC 3.4 - 10.8 x10E3/uL 5.1  4.5  4.2   Hemoglobin 11.1 - 15.9 g/dL 12.6  13.2  13.5   Hematocrit 34.0 - 46.6 % 37.1  38.9  41.3   Platelets 150 - 450 x10E3/uL 309  321  296     No results found for: "VD25OH"  Clinical ASCVD: No  The 10-year ASCVD risk score (Arnett DK, et al., 2019) is: 28.3%   Values used to calculate the score:     Age: 73 years     Sex: Female     Is Non-Hispanic African American: Yes     Diabetic: Yes     Tobacco smoker: No     Systolic Blood Pressure: 504 mmHg     Is BP treated: Yes     HDL Cholesterol: 53 mg/dL     Total Cholesterol: 159 mg/dL       08/06/2021   10:12 AM 05/14/2021    3:43 PM 02/26/2021   10:16 AM  Depression screen PHQ 2/9  Decreased Interest 0 0 0  Down, Depressed, Hopeless 0 0 0  PHQ - 2 Score 0 0 0     Other: (CHADS2VASc if Afib, MMRC or CAT for COPD, ACT, DEXA)  Social History   Tobacco Use  Smoking Status Never  Smokeless Tobacco Never   BP Readings from Last 3 Encounters:  12/05/21 122/68  11/19/21 134/72  08/22/21 112/74   Pulse Readings from Last 3 Encounters:  12/05/21 63  11/19/21 68  08/22/21 75   Wt Readings from Last 3 Encounters:  12/05/21 151 lb (68.5 kg)  11/19/21 151 lb (68.5 kg)  08/22/21 148 lb 6.4 oz (67.3 kg)   BMI Readings from Last 3 Encounters:  12/05/21 32.68 kg/m  11/19/21 32.68 kg/m  08/22/21 32.11 kg/m     Assessment/Interventions: Review of patient past medical history, allergies, medications, health status, including review of consultants reports, laboratory and other test data, was performed as part of comprehensive evaluation and provision of chronic care management services.   SDOH:  (  Social Determinants of Health) assessments and interventions performed: Yes SDOH Interventions    Flowsheet Row Most Recent Value  SDOH Interventions   Financial Strain Interventions Intervention Not Indicated  Transportation Interventions Intervention Not Indicated       SDOH Screenings   Alcohol Screen: Low Risk  (12/25/2020)   Alcohol Screen    Last Alcohol Screening Score (AUDIT): 0  Depression (PHQ2-9): Low Risk  (08/06/2021)   Depression (PHQ2-9)    PHQ-2 Score: 0  Financial Resource Strain: Low Risk  (01/15/2022)   Overall Financial Resource Strain (CARDIA)    Difficulty of Paying Living Expenses: Not hard at all  Food Insecurity: No Food Insecurity (11/21/2020)   Hunger Vital Sign    Worried About Running Out of Food in the Last Year: Never true    Ran Out of Food in the Last Year: Never true  Housing: Low Risk  (11/21/2020)   Housing    Last Housing Risk Score: 0  Physical Activity: Not on file  Social Connections: Not on file  Stress: Not on file  Tobacco Use: Low Risk  (12/05/2021)   Patient History    Smoking Tobacco Use: Never    Smokeless Tobacco Use: Never    Passive Exposure: Not on file  Transportation Needs: No Transportation Needs (01/15/2022)   PRAPARE - Transportation    Lack of Transportation (Medical): No    Lack of Transportation (Non-Medical): No    CCM Care Plan  Allergies  Allergen Reactions   Dairy Aid [Tilactase]     Hives, diarrhea   Lactose Anaphylaxis and Other (See Comments)   Atorvastatin Other (See Comments)    Severe myalgias   Hydrocodone-Acetaminophen     REACTION: sore throat with vicodin   Hydrocodone-Acetaminophen Other (See Comments)     sore throat with vicodin    Milk-Related Compounds Other (See Comments)   Mirabegron Other (See Comments)    Headache    Oxycontin [Oxycodone Hcl]     "makes me crazy"   Prochlorperazine Edisylate     REACTION: stroke-like symptoms with companzine   Rosuvastatin     Can take brand crestor-severe myalgias   Shellfish Allergy    Umeclidinium-Vilanterol Other (See Comments)    Lactose allergy   Percodan [Oxycodone-Aspirin] Palpitations    Fast heartbeat with percodan    Medications Reviewed Today     Reviewed by Lane Hacker, [facility] (Pharmacist) on 01/15/22 at 1557  Med List Status: <None>   Medication Order Taking? Sig Documenting Provider Last Dose Status Informant  acyclovir (ZOVIRAX) 400 MG tablet [id number] Yes Take 1 tablet (400 mg total) by mouth 2 (two) times daily as needed (outbreak). Cox, Kirsten, MD Taking Active   albuterol (VENTOLIN HFA) 108 [phone] Base) MCG/ACT inhaler [id number]  Inhale 2 puffs into the lungs every 6 (six) hours as needed for wheezing or shortness of breath. [provider]  Active   amLODipine (NORVASC) 5 MG tablet [id number] Yes TAKE ONE-HALF TABLET BY MOUTH  DAILY Cox, Kirsten, MD Taking Active   aspirin EC 81 MG tablet [id number] Yes Take 81 mg by mouth daily. [provider] Taking Active   azelastine (ASTELIN) 0.1 % nasal spray [id number]  Place 1 spray into both nostrils 2 (two) times daily as needed for allergies or rhinitis. [provider]  Active   Blood Glucose Monitoring Suppl (ONE TOUCH ULTRA 2) w/Device KIT [id number]  Use as instructed five times daily. E11.69 CoxElnita Maxwell, MD  Active   cetirizine (ZYRTEC) 10 MG  tablet [id number] Yes Take 10 mg by mouth daily as needed for allergies. [provider] Taking Active   Cholecalciferol (VITAMIN D PO) [id number]  Take 2,000 Units by mouth daily.  [provider]  Active   CRESTOR 10 MG tablet [id number] No TAKE 1 TABLET BY MOUTH 3 TIMES A WEEK  Patient not  taking: Reported on 01/15/2022   [name], MD Not Taking Active   Elderberry 575 MG/5ML SYRP [id number]  Take 5 mLs by mouth daily. [provider]  Active   ezetimibe (ZETIA) 10 MG tablet [id number] Yes TAKE 1 TABLET BY MOUTH DAILY Cox, Kirsten, MD Taking Active   fluticasone (FLONASE) 50 MCG/ACT nasal spray [id number] Yes USE 1 SPRAY IN EACH NOSTRIL EVERY DAY Cox, Kirsten, MD Taking Active   glucose blood (ONETOUCH ULTRA) test strip [id number]  Use as instructed five times daily. Cox, Kirsten, MD  Active   ibuprofen (ADVIL) 800 MG tablet [id number] No Take 800 mg by mouth every 8 (eight) hours as needed for headache.  Patient not taking: Reported on 01/15/2022   [provider] Not Taking Consider Medication Status and Discontinue   Lancets (ONETOUCH ULTRASOFT) lancets [id number]  Use as instructed five times daily. Cox, Kirsten, MD  Active   losartan-hydrochlorothiazide [facility]) 100-25 MG tablet [id number] Yes TAKE 1 TABLET BY MOUTH DAILY Cox, Kirsten, MD Taking Active   meloxicam (MOBIC) 7.5 MG tablet [id number] Yes Take by mouth. [provider] Taking Active   metFORMIN (GLUCOPHAGE-XR) 500 MG 24 hr tablet [id number] Yes TAKE 1 TABLET BY MOUTH TWICE  DAILY Cox, Kirsten, MD Taking Active   methocarbamol (ROBAXIN) 500 MG tablet [id number] Yes Take 500 mg by mouth 2 (two) times daily. [provider] Taking Active   metoprolol (TOPROL-XL) 200 MG 24 hr tablet [id number] Yes TAKE 1 TABLET BY MOUTH EVERY DAY Cox, Kirsten, MD Taking Active   Multiple Vitamins-Minerals (COMPLETE ENERGY) TABS [phone]  Take 1 tablet by mouth daily. [provider]  Active Self  nitroGLYCERIN (NITROSTAT) 0.4 MG SL tablet [id number]  Place 1 tablet (0.4 mg total) under the tongue every 5 (five) minutes as needed for chest pain. Revankar, Reita Cliche, MD  Active   Omega-3 Fatty Acids (FISH OIL) 1000 MG CAPS [id number]  Take 1 capsule by mouth daily. [provider]  Active    omeprazole (PRILOSEC) 20 MG capsule [id number] Yes TAKE 1 CAPSULE BY MOUTH DAILY Cox, Kirsten, MD Taking Active   traMADol (ULTRAM) 50 MG tablet [id number]  Take 50 mg by mouth 2 (two) times daily as needed for pain. [provider]  Active   triamcinolone acetonide (KENALOG-40) injection 60 mg [id number]   [name], PA-C  Active   triamcinolone ointment (KENALOG) 0.1 % [id number] No Apply 1 application topically 2 (two) times daily.  Patient not taking: Reported on 01/15/2022   [name], MD Not Taking Consider Medication Status and Discontinue   Turmeric (QC TUMERIC COMPLEX PO) [id number] Yes Take 1,000 mg by mouth daily. [provider] Taking Active   Med List Note Annamaria Boots, Kasandra Knudsen, MD 03/09/11 1408): CPAP 12, reduced to 10 as of 03/06/2011            Patient Active Problem List   Diagnosis Date Noted   Fall on same level from slipping, tripping and stumbling with subsequent striking against other object, initial encounter 12/15/2021   Neck pain 12/12/2021   Chronic right shoulder pain 11/19/2021   Right foot pain 11/19/2021   Class 1 obesity  due to excess calories with serious comorbidity and body mass index (BMI) of 32.0 to 32.9 in adult 11/19/2021   Asthma 07/10/2021   Obesity 07/10/2021   Fibromyalgia 04/10/2021   DM type 2 with diabetic mixed hyperlipidemia ([place]) 04/08/2021   Right temporal frontal scalp contusions 03/18/2021   Shoulder joint pain 06/25/2020   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   Corn of toe 06/25/2020   Hyperlipidemia 06/25/2020   Lateral epicondylitis 06/25/2020   Menopause 06/25/2020   Multiple benign melanocytic nevi 06/25/2020   OSA (obstructive sleep apnea)    Chronic back pain    DJD (degenerative joint disease)    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   Encounter for immunization 03/15/2018   Dyslipidemia 01/23/2015   Hypertension associated with diabetes ([place]) 01/23/2015   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    Degeneration of lumbosacral intervertebral disc 01/26/2012   AODM 10/17/2007   Seasonal and perennial allergic rhinitis 10/17/2007   Allergic asthma, mild intermittent, uncomplicated 85/27/7824   GERD 10/17/2007    Immunization History  Administered Date(s) Administered   Fluad Quad(high Dose 65+) 04/08/2021   Hepatitis B, adult 07/01/2003   Influenza Split 03/12/2011, 03/30/2012, 04/01/2013, 04/04/2014, 04/03/2015, 04/02/2016, 03/30/2017, 04/14/2020   Influenza Whole 04/30/2009, 03/06/2011   Influenza, High Dose Seasonal PF 03/30/2017, 03/11/2018, 03/11/2018, 03/16/2019   Influenza,inj,Quad PF,6+ Mos 03/30/2013, 03/30/2014, 04/03/2015, 04/02/2016   Moderna Covid-19 Vaccine Bivalent Booster 50yr & up 04/08/2021   Moderna Sars-Covid-2 Vaccination 08/05/2019, 08/31/2019, 04/25/2020   Pneumococcal Conjugate-13 02/16/2014   Pneumococcal Polysaccharide-23 06/30/1996, 10/28/2016   Td 07/01/2003   Tdap 01/16/2012   Zoster, Live 07/27/2013, 05/09/2020, 08/12/2020    Conditions to be addressed/monitored:  Hypertension, Hyperlipidemia, Diabetes, GERD and vitamin D deficiency, obstructive sleep apnea.   Care Plan : CCM Pharmacy Care Plan  Updates made by KLane Hacker RPH since 01/15/2022 12:00 AM     Problem: dm,htn, hld   Priority: High  Onset Date: 11/22/2020     Long-Range Goal: Disease State Management   Start Date: 11/22/2020  Expected End Date: 11/22/2021  Recent Progress: On track  Priority: High  Note:   Current Barriers:  Unable to independently afford treatment regimen  Pharmacist Clinical Goal(s):  Patient will verbalize ability to afford treatment regimen through collaboration with PharmD and provider.   Interventions: 1:1 collaboration with CRochel Brome MD regarding development and update of comprehensive plan of care as evidenced by provider attestation and co-signature Inter-disciplinary care team collaboration (see longitudinal plan of care) Comprehensive  medication review performed; medication list updated in electronic medical record  Hypertension (BP goal <140/80) BP Readings from Last 3 Encounters:  12/05/21 122/68  11/19/21 134/72  08/22/21 112/74  -Controlled -Current treatment: amlodipine 2.5 mg daily Appropriate, Query effective Losartan-hydrochlorothiazide 100/25 mg daily Appropriate, Query effective Metoprolol succinate 200 mg daily Appropriate, Query effective,  -Medications previously tried: none reported  -Current home readings:  28 December 2021: (States tends to be higher in AM before she takes her meds) 119/69 113/62 125/65 126/66 136/76 140/79 137/79 01/14/22: 152/82 01/15/22: 162/87, 127/77 -Current dietary habits: eats breakfast and supper mainly.  -Current exercise habits: Was previously in karate and exercise class. She hopes to rejoin the class soon. Had stopped prior to [place]  -Affirmshypotensive/hypertensive symptoms -Educated on BP goals and benefits of medications for prevention of heart attack, stroke and kidney damage; Daily salt intake goal < 2300 mg; Exercise goal of 150 minutes per week; Importance of home blood pressure monitoring; July 2023: BP is elevated.  However, patient states she becomes dizzy at a BP around 983'J-825 systolic. Patient is constantly taking half a pill or skipping doses to prevent herself from becoming dizzy (She had a sever fall last month). She is taking Metoprolol and Losartan at night and Amlodipine in the AM. Counseled to take Amlodipine at night to prevent hypotension during the day and because her BP tends to be fine during the day but will be too high in AM when she tests her BP first thing upon waking/before pills.Will have patient write BP daily and write how/when she took meds and f/u next month. Will also let PCP know  Hyperlipidemia: (LDL goal < 100) The 10-year ASCVD risk score (Arnett DK, et al., 2019) is: 28.3%   Values used to calculate the score:     Age: 68 years      Sex: Female     Is Non-Hispanic African American: Yes     Diabetic: Yes     Tobacco smoker: No     Systolic Blood Pressure: 053 mmHg     Is BP treated: Yes     HDL Cholesterol: 53 mg/dL     Total Cholesterol: 159 mg/dL Lab Results  Component Value Date   CHOL 159 11/19/2021   CHOL 222 (H) 08/06/2021   CHOL 162 04/08/2021   Lab Results  Component Value Date   HDL 53 11/19/2021   HDL 52 08/06/2021   HDL 54 04/08/2021   Lab Results  Component Value Date   LDLCALC 86 11/19/2021   LDLCALC 141 (H) 08/06/2021   LDLCALC 90 04/08/2021   Lab Results  Component Value Date   TRIG 108 11/19/2021   TRIG 160 (H) 08/06/2021   TRIG 101 04/08/2021   Lab Results  Component Value Date   CHOLHDL 3.0 11/19/2021   CHOLHDL 4.3 08/06/2021   CHOLHDL 3.0 04/08/2021  No results found for: "LDLDIRECT" Last vitamin D No results found for: "25OHVITD2", "25OHVITD3", "VD25OH" -Controlled -Current treatment: aspirin ec 81 mg daily Appropriate, Effective, Safe, Accessible Crestor (Brand) 10 mg twice weekly Query Appropriate Ezetimibe 10mg  Query Appropriate, Effective, Safe, Accessible -Medications previously tried: brand name Crestor, atorvastatin -Current dietary patterns: reports breakfast meat and hot dogs  -Current exercise habits: was previously involved in karate and exercise class prior to [place] -Educated on Cholesterol goals;  Benefits of statin for ASCVD risk reduction; Importance of limiting foods high in cholesterol; Exercise goal of 150 minutes per week; Jan 2023: Recommended considering Crestor 10 mg three times weekly. Patient reports she can tolerate brand name Crestor but the generic leaves her achy. Pharmacist to request Dispense as Written for brand name Crestor.  Assessed patient finances. Patient approved for [employer] to cover cost of Crestor.  July 2023: Patient states she stopped Rosuvastatin once she started Ezetimibe. Still on medslist, will ask PCP about  removing.  Diabetes (A1c goal <8%) Lab Results  Component Value Date   HGBA1C 7.2 (H) 11/19/2021   HGBA1C 6.9 (H) 08/06/2021   HGBA1C 6.8 (H) 04/08/2021   Lab Results  Component Value Date   MICROALBUR 10 12/25/2020   LDLCALC 86 11/19/2021   CREATININE 0.82 11/19/2021   Lab Results  Component Value Date   NA 140 11/19/2021   K 4.1 11/19/2021   CREATININE 0.82 11/19/2021   EGFR 74 11/19/2021   GFRNONAA >60 09/14/2008   GLUCOSE 124 (H) 11/19/2021   Lab Results  Component Value Date   WBC 5.1 11/19/2021   HGB 12.6 11/19/2021   HCT  37.1 11/19/2021   MCV 90 11/19/2021   PLT 309 11/19/2021  -Controlled -Current medications: Metformin xr 500 mg twice daily Appropriate, Effective, Safe, Accessible One touch verio test strips twice daily  Lancets daily prior to meal  -Medications previously tried: Metformin 520m 3/day (ADR's) -Current home glucose readings fasting glucose:  May 2022: 120, 121, 136, 156, 124, 128, 116, 128, 164 (birthday cake), 143, 168 (birthday cake) Jan 2023: Didn't have readings on her July 2023: Didn't have readings on her -Denies hypoglycemic/hyperglycemic symptoms -Current meal patterns:  Late breakfast or early lunch: egg, bacon or sausage with grits or pancake occasionally (a few times a month). JDanton ClapCroissant once a week as a treat.   dinner: dines out mainly.  snacks:  more birthday cake lately since celebrating all month long),  drinks: unsweet tea, coffee  -Current exercise: limited since not attending exercise class after COVID -Educated on A1c and blood sugar goals; Complications of diabetes including kidney damage, retinal damage, and cardiovascular disease; Exercise goal of 150 minutes per week; Benefits of routine self-monitoring of blood sugar; Carbohydrate counting and/or plate method -Counseled to check feet daily and get yearly eye exams -Counseled on diet and exercise extensively Recommended to continue current  medication Jan 2023: Patient doesn't want to increase Metformin to 3/day nor does she want to start Ozempic. Updated medslist and will let PCP know    Disc Degeneration (Goal: manage pain) -Pain Scale  -Jan 2023: Patient didn't specify, only said she is very content -Controlled -Current treatment  tramadol 50 mg bid prn Appropriate, Effective, Safe, Accessible Meloxicam 138mQD PRN Query Appropriate,  IBU [id number] Appropriate,  -Medications previously tried:N/A July 2023: IBU was on patients meds list and Meloxicam wasn't. Patient states she has both but that she doesn't take both. Asked her which she prefers and that we should stick with one. She called back and said she prefers Meloxicam. Updated Medslist and once again counseled not to take together   Patient Goals/Self-Care Activities Patient will:  - take medications as prescribed focus on medication adherence by taking medication as needed check glucose daily, document, and provide at future appointments check blood pressure daily, document, and provide at future appointments target a minimum of 150 minutes of moderate intensity exercise weekly engage in dietary modifications by limiting carbohydrates and fried/fatty foods. Encouraged lean protein, fruits and vegetables.   Follow Up Plan: Telephone follow up appointment with care management team member scheduled for: August 2023  [place] - [phone]       Medication Assistance: N/A   Care Gaps: Last eye exam / Retinopathy Screening? 01/01/21 Last Annual Wellness Visit? 05/14/21 Last Diabetic Foot Exam? 08/06/21     Star Rating Drugs:  Medication:                Last Fill:         Day Supply Metformin                    10/31/21              90ds                                     07/29/21            90ds  [facility]  [id number] [facility] [id number] [facility] [address]  [place] [address] [place] [address] Phone: [phone] Fax: [phone]  [facility] ([facility] ) - [place], [place] [address] Phone: [phone] Fax: [phone]  Uses pill box? No - stores together in cabinet and denies missed doses Pt endorses good compliance  We discussed: Benefits of medication synchronization, packaging and delivery as well as enhanced pharmacist oversight with [employer]. Patient decided to: Continue current medication management strategy  Care Plan and Follow Up Patient Decision:  Patient agrees to Care Plan and Follow-up.  Plan: Telephone follow up appointment with care management team member scheduled for:  August 2023  [place], [place].D. - [phone]

## 2022-01-20 ENCOUNTER — Telehealth: Payer: Self-pay

## 2022-01-20 DIAGNOSIS — M5412 Radiculopathy, cervical region: Secondary | ICD-10-CM | POA: Insufficient documentation

## 2022-01-20 DIAGNOSIS — M25511 Pain in right shoulder: Secondary | ICD-10-CM | POA: Diagnosis not present

## 2022-01-20 DIAGNOSIS — M47816 Spondylosis without myelopathy or radiculopathy, lumbar region: Secondary | ICD-10-CM

## 2022-01-20 DIAGNOSIS — M75101 Unspecified rotator cuff tear or rupture of right shoulder, not specified as traumatic: Secondary | ICD-10-CM | POA: Diagnosis not present

## 2022-01-20 HISTORY — DX: Radiculopathy, cervical region: M54.12

## 2022-01-20 HISTORY — DX: Spondylosis without myelopathy or radiculopathy, lumbar region: M47.816

## 2022-01-20 NOTE — Chronic Care Management (AMB) (Unsigned)
Chronic Care Management Pharmacy Assistant   Name: Mindy Ingram  MRN: [id number] DOB: April 22, 1946  Reason for Encounter: Medication Follow up   Received a message from [place] needing patient to be contacted about her Rosuvastatin and ask the patient if she had issues with it? If not, please tell her to take again and make sure the [place] is still active.   Called patent no answer, left message to return call.  Patient returned call left message with the office, called patient back no answer, left message about Rosuvastatin and to return my call.   Medications: Outpatient Encounter Medications as of 01/20/2022  Medication Sig   acyclovir (ZOVIRAX) 400 MG tablet Take 1 tablet (400 mg total) by mouth 2 (two) times daily as needed (outbreak).   albuterol (VENTOLIN HFA) 108 (90 Base) MCG/ACT inhaler Inhale 2 puffs into the lungs every 6 (six) hours as needed for wheezing or shortness of breath.   amLODipine (NORVASC) 5 MG tablet TAKE ONE-HALF TABLET BY MOUTH  DAILY   aspirin EC 81 MG tablet Take 81 mg by mouth daily.   azelastine (ASTELIN) 0.1 % nasal spray Place 1 spray into both nostrils 2 (two) times daily as needed for allergies or rhinitis.   Blood Glucose Monitoring Suppl (ONE TOUCH ULTRA 2) w/Device KIT Use as instructed five times daily. E11.69   cetirizine (ZYRTEC) 10 MG tablet Take 10 mg by mouth daily as needed for allergies.   Cholecalciferol (VITAMIN D PO) Take 2,000 Units by mouth daily.    CRESTOR 10 MG tablet TAKE 1 TABLET BY MOUTH 3 TIMES A WEEK (Patient not taking: Reported on 01/15/2022)   Elderberry 575 MG/5ML SYRP Take 5 mLs by mouth daily.   ezetimibe (ZETIA) 10 MG tablet TAKE 1 TABLET BY MOUTH DAILY   fluticasone (FLONASE) 50 MCG/ACT nasal spray USE 1 SPRAY IN EACH NOSTRIL EVERY DAY   glucose blood (ONETOUCH ULTRA) test strip Use as instructed five times daily.   ibuprofen (ADVIL) 800 MG tablet Take 800 mg by mouth every 8  (eight) hours as needed for headache. (Patient not taking: Reported on 01/15/2022)   Lancets (ONETOUCH ULTRASOFT) lancets Use as instructed five times daily.   losartan-hydrochlorothiazide (HYZAAR) 100-25 MG tablet TAKE 1 TABLET BY MOUTH DAILY   meloxicam (MOBIC) 7.5 MG tablet Take by mouth.   metFORMIN (GLUCOPHAGE-XR) 500 MG 24 hr tablet TAKE 1 TABLET BY MOUTH TWICE  DAILY   methocarbamol (ROBAXIN) 500 MG tablet Take 500 mg by mouth 2 (two) times daily.   metoprolol (TOPROL-XL) 200 MG 24 hr tablet TAKE 1 TABLET BY MOUTH EVERY DAY   Multiple Vitamins-Minerals (COMPLETE ENERGY) TABS Take 1 tablet by mouth daily.   nitroGLYCERIN (NITROSTAT) 0.4 MG SL tablet Place 1 tablet (0.4 mg total) under the tongue every 5 (five) minutes as needed for chest pain.   Omega-3 Fatty Acids (FISH OIL) 1000 MG CAPS Take 1 capsule by mouth daily.   omeprazole (PRILOSEC) 20 MG capsule TAKE 1 CAPSULE BY MOUTH DAILY   traMADol (ULTRAM) 50 MG tablet Take 50 mg by mouth 2 (two) times daily as needed for pain.   triamcinolone ointment (KENALOG) 0.1 % Apply 1 application topically 2 (two) times daily. (Patient not taking: Reported on 01/15/2022)   Turmeric (QC TUMERIC COMPLEX PO) Take 1,000 mg by mouth daily.   Facility-Administered Encounter Medications as of 01/20/2022  Medication   triamcinolone acetonide (KENALOG-40) injection 60 mg    Pattricia Boss, CMA Clinical  Pharmacist Assistant [phone]

## 2022-01-20 NOTE — Progress Notes (Signed)
Will have CCM team reach out and ask about Rosuvastatin and if she had ADR's on it. If not, will have team coordinate and start taking again and make sure [employer] grant is active.

## 2022-01-21 ENCOUNTER — Telehealth: Payer: Self-pay

## 2022-01-21 DIAGNOSIS — H2703 Aphakia, bilateral: Secondary | ICD-10-CM | POA: Diagnosis not present

## 2022-01-21 DIAGNOSIS — E119 Type 2 diabetes mellitus without complications: Secondary | ICD-10-CM | POA: Diagnosis not present

## 2022-01-21 LAB — HM DIABETES EYE EXAM

## 2022-01-21 NOTE — Telephone Encounter (Signed)
Spoke with patient, see encounter note. Thank you   Pattricia Boss, [facility] Pharmacist Assistant [phone]

## 2022-01-21 NOTE — Chronic Care Management (AMB) (Signed)
Patient returned call and left message with office, called patient back no answer, left message regarding Atorvastatin (see previous telephone note 01/20/2022) and to return my call.  Patient returned call, she stated that Crestor was switched to Zetia for Lipids due to having muscle pains being on statins, she was able to tolerate Crestor of a low dose and did have an [employer] but that is no longer active. Patient does not want to go back on Atorvastatin and would like to continue with Zetia.  [place] Constable, Pharm D notified.  Pattricia Boss, [place] Pharmacist Assistant [phone]

## 2022-01-21 NOTE — Telephone Encounter (Signed)
Please call again.

## 2022-01-21 NOTE — Telephone Encounter (Signed)
Pt called asking to speak with Tamela about a phone follow up about her medicine. Please call her and if need be its okay to leave a voicemail.

## 2022-01-22 ENCOUNTER — Encounter: Payer: Self-pay | Admitting: Family Medicine

## 2022-01-22 DIAGNOSIS — M75101 Unspecified rotator cuff tear or rupture of right shoulder, not specified as traumatic: Secondary | ICD-10-CM | POA: Diagnosis not present

## 2022-01-22 DIAGNOSIS — M25511 Pain in right shoulder: Secondary | ICD-10-CM | POA: Diagnosis not present

## 2022-01-27 DIAGNOSIS — E782 Mixed hyperlipidemia: Secondary | ICD-10-CM | POA: Diagnosis not present

## 2022-01-27 DIAGNOSIS — E1169 Type 2 diabetes mellitus with other specified complication: Secondary | ICD-10-CM

## 2022-01-27 DIAGNOSIS — I10 Essential (primary) hypertension: Secondary | ICD-10-CM

## 2022-01-29 DIAGNOSIS — M25511 Pain in right shoulder: Secondary | ICD-10-CM | POA: Diagnosis not present

## 2022-01-29 DIAGNOSIS — M75101 Unspecified rotator cuff tear or rupture of right shoulder, not specified as traumatic: Secondary | ICD-10-CM | POA: Diagnosis not present

## 2022-01-31 DIAGNOSIS — M25511 Pain in right shoulder: Secondary | ICD-10-CM | POA: Diagnosis not present

## 2022-01-31 DIAGNOSIS — M75101 Unspecified rotator cuff tear or rupture of right shoulder, not specified as traumatic: Secondary | ICD-10-CM | POA: Diagnosis not present

## 2022-02-03 ENCOUNTER — Other Ambulatory Visit: Payer: Self-pay | Admitting: Family Medicine

## 2022-02-04 DIAGNOSIS — M2560 Stiffness of unspecified joint, not elsewhere classified: Secondary | ICD-10-CM | POA: Diagnosis not present

## 2022-02-04 DIAGNOSIS — M6281 Muscle weakness (generalized): Secondary | ICD-10-CM | POA: Diagnosis not present

## 2022-02-04 DIAGNOSIS — M5489 Other dorsalgia: Secondary | ICD-10-CM | POA: Diagnosis not present

## 2022-02-04 DIAGNOSIS — M542 Cervicalgia: Secondary | ICD-10-CM | POA: Diagnosis not present

## 2022-02-06 DIAGNOSIS — M5489 Other dorsalgia: Secondary | ICD-10-CM | POA: Diagnosis not present

## 2022-02-06 DIAGNOSIS — M6281 Muscle weakness (generalized): Secondary | ICD-10-CM | POA: Diagnosis not present

## 2022-02-06 DIAGNOSIS — M2560 Stiffness of unspecified joint, not elsewhere classified: Secondary | ICD-10-CM | POA: Diagnosis not present

## 2022-02-06 DIAGNOSIS — M542 Cervicalgia: Secondary | ICD-10-CM | POA: Diagnosis not present

## 2022-02-10 DIAGNOSIS — M5489 Other dorsalgia: Secondary | ICD-10-CM | POA: Diagnosis not present

## 2022-02-10 DIAGNOSIS — M2560 Stiffness of unspecified joint, not elsewhere classified: Secondary | ICD-10-CM | POA: Diagnosis not present

## 2022-02-10 DIAGNOSIS — M6281 Muscle weakness (generalized): Secondary | ICD-10-CM | POA: Diagnosis not present

## 2022-02-10 DIAGNOSIS — M542 Cervicalgia: Secondary | ICD-10-CM | POA: Diagnosis not present

## 2022-02-11 ENCOUNTER — Ambulatory Visit (INDEPENDENT_AMBULATORY_CARE_PROVIDER_SITE_OTHER): Payer: Medicare Other

## 2022-02-11 DIAGNOSIS — I10 Essential (primary) hypertension: Secondary | ICD-10-CM

## 2022-02-11 DIAGNOSIS — E782 Mixed hyperlipidemia: Secondary | ICD-10-CM

## 2022-02-11 NOTE — Progress Notes (Signed)
Chronic Care Management Pharmacy Note  02/11/2022 Name:  Mindy Ingram MRN:  [id number] DOB:  December 30, 1945   Summary: Very pleasant 76 year old female presents for f/u CCM visit. She just completed her BS in Theology at the age of 36. She was married at 29 and has 1 child with 5 grandchildren. In her free time, she loved taking Karate classes but when Covid started, she had to stop   Plan Updates:  None, sugars and BP look better. Please see CP for readings   Subjective: Mindy Ingram is an 76 y.o. year old female who is a primary patient of Cox, Kirsten, MD.  The CCM team was consulted for assistance with disease management and care coordination needs.    Engaged with patient by telephone for follow up visit in response to provider referral for pharmacy case management and/or care coordination services.   Consent to Services:  The patient was given the following information about Chronic Care Management services today, agreed to services, and gave verbal consent: 1. CCM service includes personalized support from designated clinical staff supervised by the primary care provider, including individualized plan of care and coordination with other care providers 2. 24/7 contact phone numbers for assistance for urgent and routine care needs. 3. Service will only be billed when office clinical staff spend 20 minutes or more in a month to coordinate care. 4. Only one practitioner may furnish and bill the service in a calendar month. 5.The patient may stop CCM services at any time (effective at the end of the month) by phone call to the office staff. 6. The patient will be responsible for cost sharing (co-pay) of up to 20% of the service fee (after annual deductible is met). Patient agreed to services and consent obtained.  Patient Care Team: [name], MD as PCP - General (Family Medicine) Nehemiah Settle, MD (Gastroenterology) Hennie Duos, MD as Consulting Physician  (Rheumatology) Sharyn Dross., DPM (Podiatry) Lane Hacker, [employer] [facility] (Pharmacist) Darleen Crocker, MD as Consulting Physician (Ophthalmology) Valente David, [place] (Optometry)  Recent office visits:  None   Recent consult visits:  None   Hospital visits:  None  Objective:  Lab Results  Component Value Date   CREATININE 0.82 11/19/2021   BUN 11 11/19/2021   GFRNONAA >60 09/14/2008   GFRAA  09/14/2008    >60        The eGFR has been calculated using the MDRD equation. This calculation has not been validated in all clinical situations. eGFR's persistently <60 mL/min signify possible Chronic Kidney Disease.   NA 140 11/19/2021   K 4.1 11/19/2021   CALCIUM 9.4 11/19/2021   CO2 25 11/19/2021   GLUCOSE 124 (H) 11/19/2021    Lab Results  Component Value Date/Time   HGBA1C 7.2 (H) 11/19/2021 10:04 AM   HGBA1C 6.9 (H) 08/06/2021 10:51 AM   MICROALBUR 10 12/25/2020 10:17 AM    Last diabetic Eye exam:  Lab Results  Component Value Date/Time   HMDIABEYEEXA No Retinopathy 01/21/2022 12:00 AM    Last diabetic Foot exam: No results found for: "HMDIABFOOTEX"   Lab Results  Component Value Date   CHOL 159 11/19/2021   HDL 53 11/19/2021   LDLCALC 86 11/19/2021   TRIG 108 11/19/2021   CHOLHDL 3.0 11/19/2021       Latest Ref Rng & Units 11/19/2021   10:04 AM 08/06/2021   10:51 AM 04/08/2021   11:38 AM  Hepatic Function  Total Protein 6.0 - 8.5  g/dL 6.8  7.2  6.7   Albumin 3.7 - 4.7 g/dL 4.3  4.7  4.6   AST 0 - 40 IU/L _0 ALT 0 - 32 IU/L _1 Alk Phosphatase 44 - 121 IU/L 51  47  47   Total Bilirubin 0.0 - 1.2 mg/dL 0.3  0.4  0.3     Lab Results  Component Value Date/Time   TSH 1.370 11/19/2021 10:04 AM   TSH 1.450 09/20/2020 10:21 AM       Latest Ref Rng & Units 11/19/2021   10:04 AM 08/06/2021   10:51 AM 04/08/2021   11:38 AM  CBC  WBC 3.4 - 10.8 x10E3/uL 5.1  4.5  4.2   Hemoglobin 11.1 - 15.9 g/dL 12.6  13.2  13.5   Hematocrit 34.0 -  46.6 % 37.1  38.9  41.3   Platelets 150 - 450 x10E3/uL 309  321  296     No results found for: "VD25OH"  Clinical ASCVD: No  The 10-year ASCVD risk score (Arnett DK, et al., 2019) is: 28.3%   Values used to calculate the score:     Age: 53 years     Sex: Female     Is Non-Hispanic African American: Yes     Diabetic: Yes     Tobacco smoker: No     Systolic Blood Pressure: 510 mmHg     Is BP treated: Yes     HDL Cholesterol: 53 mg/dL     Total Cholesterol: 159 mg/dL       08/06/2021   10:12 AM 05/14/2021    3:43 PM 02/26/2021   10:16 AM  Depression screen PHQ 2/9  Decreased Interest 0 0 0  Down, Depressed, Hopeless 0 0 0  PHQ - 2 Score 0 0 0     Other: (CHADS2VASc if Afib, MMRC or CAT for COPD, ACT, DEXA)  Social History   Tobacco Use  Smoking Status Never  Smokeless Tobacco Never   BP Readings from Last 3 Encounters:  12/05/21 122/68  11/19/21 134/72  08/22/21 112/74   Pulse Readings from Last 3 Encounters:  12/05/21 63  11/19/21 68  08/22/21 75   Wt Readings from Last 3 Encounters:  12/05/21 151 lb (68.5 kg)  11/19/21 151 lb (68.5 kg)  08/22/21 148 lb 6.4 oz (67.3 kg)   BMI Readings from Last 3 Encounters:  12/05/21 32.68 kg/m  11/19/21 32.68 kg/m  08/22/21 32.11 kg/m    Assessment/Interventions: Review of patient past medical history, allergies, medications, health status, including review of consultants reports, laboratory and other test data, was performed as part of comprehensive evaluation and provision of chronic care management services.   SDOH:  (Social Determinants of Health) assessments and interventions performed: Yes SDOH Interventions    Flowsheet Row Most Recent Value  SDOH Interventions   Financial Strain Interventions Intervention Not Indicated  Transportation Interventions Intervention Not Indicated       SDOH Screenings   Alcohol Screen: Low Risk  (12/25/2020)   Alcohol Screen    Last Alcohol Screening Score (AUDIT): 0   Depression (PHQ2-9): Low Risk  (08/06/2021)   Depression (PHQ2-9)    PHQ-2 Score: 0  Financial Resource Strain: Low Risk  (02/11/2022)   Overall Financial Resource Strain (CARDIA)    Difficulty of Paying Living Expenses: Not hard at all  Food Insecurity: No Food Insecurity (11/21/2020)   Hunger Vital Sign    Worried About Running  Out of Food in the Last Year: Never true    Ran Out of Food in the Last Year: Never true  Housing: Low Risk  (11/21/2020)   Housing    Last Housing Risk Score: 0  Physical Activity: Not on file  Social Connections: Not on file  Stress: Not on file  Tobacco Use: Low Risk  (12/05/2021)   Patient History    Smoking Tobacco Use: Never    Smokeless Tobacco Use: Never    Passive Exposure: Not on file  Transportation Needs: No Transportation Needs (02/11/2022)   PRAPARE - Transportation    Lack of Transportation (Medical): No    Lack of Transportation (Non-Medical): No    CCM Care Plan  Allergies  Allergen Reactions   Dairy Aid [Tilactase]     Hives, diarrhea   Lactose Anaphylaxis and Other (See Comments)   Atorvastatin Other (See Comments)    Severe myalgias   Hydrocodone-Acetaminophen     REACTION: sore throat with vicodin   Hydrocodone-Acetaminophen Other (See Comments)    sore throat with vicodin    Milk-Related Compounds Other (See Comments)   Mirabegron Other (See Comments)    Headache    Oxycontin [Oxycodone Hcl]     "makes me crazy"   Prochlorperazine Edisylate     REACTION: stroke-like symptoms with companzine   Rosuvastatin     Can take brand crestor-severe myalgias   Shellfish Allergy    Umeclidinium-Vilanterol Other (See Comments)    Lactose allergy   Percodan [Oxycodone-Aspirin] Palpitations    Fast heartbeat with percodan    Medications Reviewed Today     Reviewed by Lane Hacker, [facility] (Pharmacist) on 02/11/22 at 1144  Med List Status: <None>   Medication Order Taking? Sig Documenting Provider Last Dose Status Informant   acyclovir (ZOVIRAX) 400 MG tablet [id number] No Take 1 tablet (400 mg total) by mouth 2 (two) times daily as needed (outbreak). Cox, Kirsten, MD Taking Active   albuterol (VENTOLIN HFA) 108 [phone] Base) MCG/ACT inhaler [id number] No Inhale 2 puffs into the lungs every 6 (six) hours as needed for wheezing or shortness of breath. [provider] Taking Active   amLODipine (NORVASC) 5 MG tablet [id number] No TAKE ONE-HALF TABLET BY MOUTH  DAILY Cox, Kirsten, MD Taking Active   aspirin EC 81 MG tablet [id number] No Take 81 mg by mouth daily. [provider] Taking Active   azelastine (ASTELIN) 0.1 % nasal spray [id number] No Place 1 spray into both nostrils 2 (two) times daily as needed for allergies or rhinitis. [provider] Taking Active   Blood Glucose Monitoring Suppl (ONE TOUCH ULTRA 2) w/Device KIT [id number] No Use as instructed five times daily. E11.69 CoxElnita Maxwell, MD Taking Active   cetirizine (ZYRTEC) 10 MG tablet [id number] No Take 10 mg by mouth daily as needed for allergies. [provider] Taking Active   Cholecalciferol (VITAMIN D PO) [id number] No Take 2,000 Units by mouth daily.  [provider] Taking Active   CRESTOR 10 MG tablet [id number] No TAKE 1 TABLET BY MOUTH 3 TIMES A WEEK  Patient not taking: Reported on 01/15/2022   [name], MD Not Taking Active   Elderberry 575 MG/5ML SYRP [id number] No Take 5 mLs by mouth daily. [provider] Taking Active   ezetimibe (ZETIA) 10 MG tablet [id number] No TAKE 1 TABLET BY MOUTH DAILY Cox, Kirsten, MD Taking Active   fluticasone (FLONASE) 50 MCG/ACT nasal spray [id number] No USE 1 SPRAY IN EACH NOSTRIL EVERY DAY  Cox, Kirsten, MD Taking Active   glucose blood [facility] ULTRA) test strip [id number] No Use as instructed five times daily. Cox, Kirsten, MD Taking Active   ibuprofen (ADVIL) 800 MG tablet [id number] No Take 800 mg by mouth every 8 (eight) hours as needed for headache.  Patient not  taking: Reported on 01/15/2022   [provider] Not Taking Active   Lancets [facility] ULTRASOFT) lancets [id number] No Use as instructed five times daily. Cox, Kirsten, MD Taking Active   losartan-hydrochlorothiazide [facility]) 100-25 MG tablet [id number]  TAKE 1 TABLET BY MOUTH DAILY Cox, Kirsten, MD  Active   meloxicam (MOBIC) 7.5 MG tablet [id number] No Take by mouth. [provider] Taking Active   metFORMIN (GLUCOPHAGE-XR) 500 MG 24 hr tablet [id number] No TAKE 1 TABLET BY MOUTH TWICE  DAILY Cox, Kirsten, MD Taking Active   methocarbamol (ROBAXIN) 500 MG tablet [id number] No Take 500 mg by mouth 2 (two) times daily. [provider] Taking Active   metoprolol (TOPROL-XL) 200 MG 24 hr tablet [id number] No TAKE 1 TABLET BY MOUTH EVERY DAY Cox, Kirsten, MD Taking Active   Multiple Vitamins-Minerals (COMPLETE ENERGY) TABS [phone] No Take 1 tablet by mouth daily. [provider] Taking Active Self  nitroGLYCERIN (NITROSTAT) 0.4 MG SL tablet [id number] No Place 1 tablet (0.4 mg total) under the tongue every 5 (five) minutes as needed for chest pain. Revankar, Reita Cliche, MD Taking Active   Omega-3 Fatty Acids (FISH OIL) 1000 MG CAPS [id number] No Take 1 capsule by mouth daily. [provider] Taking Active   omeprazole (PRILOSEC) 20 MG capsule [id number] No TAKE 1 CAPSULE BY MOUTH DAILY Cox, Kirsten, MD Taking Active   traMADol (ULTRAM) 50 MG tablet [id number] No Take 50 mg by mouth 2 (two) times daily as needed for pain. [provider] Taking Active   triamcinolone acetonide (KENALOG-40) injection 60 mg [id number]   [name], PA-C  Active   triamcinolone ointment (KENALOG) 0.1 % [id number] No Apply 1 application topically 2 (two) times daily.  Patient not taking: Reported on 01/15/2022   [name], MD Not Taking Active   Turmeric (QC TUMERIC COMPLEX PO) [id number] No Take 1,000 mg by mouth daily. [provider] Taking Active   Med List Note Annamaria Boots,  Kasandra Knudsen, MD 03/09/11 1408): CPAP 12, reduced to 10 as of 03/06/2011            Patient Active Problem List   Diagnosis Date Noted   Fall on same level from slipping, tripping and stumbling with subsequent striking against other object, initial encounter 12/15/2021   Neck pain 12/12/2021   Chronic right shoulder pain 11/19/2021   Right foot pain 11/19/2021   Class 1 obesity due to excess calories with serious comorbidity and body mass index (BMI) of 32.0 to 32.9 in adult 11/19/2021   Asthma 07/10/2021   Obesity 07/10/2021   Fibromyalgia 04/10/2021   DM type 2 with diabetic mixed hyperlipidemia ([place]) 04/08/2021   Right temporal frontal scalp contusions 03/18/2021   Shoulder joint pain 06/25/2020   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   Corn of toe 06/25/2020   Hyperlipidemia 06/25/2020   Lateral epicondylitis 06/25/2020   Menopause 06/25/2020   Multiple benign melanocytic nevi 06/25/2020   OSA (obstructive sleep apnea)    Chronic back pain    DJD (degenerative joint disease)    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   Encounter for immunization 03/15/2018   Dyslipidemia 01/23/2015   Hypertension associated with diabetes (  [place]) 01/23/2015   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   Degeneration of lumbosacral intervertebral disc 01/26/2012   AODM 10/17/2007   Seasonal and perennial allergic rhinitis 10/17/2007   Allergic asthma, mild intermittent, uncomplicated 99/83/3825   GERD 10/17/2007    Immunization History  Administered Date(s) Administered   Fluad Quad(high Dose 65+) 04/08/2021   Hepatitis B, adult 07/01/2003   Influenza Split 03/12/2011, 03/30/2012, 04/01/2013, 04/04/2014, 04/03/2015, 04/02/2016, 03/30/2017, 04/14/2020   Influenza Whole 04/30/2009, 03/06/2011   Influenza, High Dose Seasonal PF 03/30/2017, 03/11/2018, 03/11/2018, 03/16/2019   Influenza,inj,Quad PF,6+ Mos 03/30/2013, 03/30/2014, 04/03/2015, 04/02/2016   Moderna Covid-19 Vaccine Bivalent Booster 19yr &  up 04/08/2021   Moderna Sars-Covid-2 Vaccination 08/05/2019, 08/31/2019, 04/25/2020   Pneumococcal Conjugate-13 02/16/2014   Pneumococcal Polysaccharide-23 06/30/1996, 10/28/2016   Td 07/01/2003   Tdap 01/16/2012   Zoster, Live 07/27/2013, 05/09/2020, 08/12/2020    Conditions to be addressed/monitored:  Hypertension, Hyperlipidemia, Diabetes, GERD and vitamin D deficiency, obstructive sleep apnea.   Care Plan : CCM Pharmacy Care Plan  Updates made by KLane Hacker RPH since 02/11/2022 12:00 AM     Problem: dm,htn, hld   Priority: High  Onset Date: 11/22/2020     Long-Range Goal: Disease State Management   Start Date: 11/22/2020  Expected End Date: 11/22/2021  Recent Progress: On track  Priority: High  Note:   Current Barriers:  Unable to independently afford treatment regimen  Pharmacist Clinical Goal(s):  Patient will verbalize ability to afford treatment regimen through collaboration with PharmD and provider.   Interventions: 1:1 collaboration with CRochel Brome MD regarding development and update of comprehensive plan of care as evidenced by provider attestation and co-signature Inter-disciplinary care team collaboration (see longitudinal plan of care) Comprehensive medication review performed; medication list updated in electronic medical record  Hypertension (BP goal <140/80) BP Readings from Last 3 Encounters:  12/05/21 122/68  11/19/21 134/72  08/22/21 112/74  -Controlled -Current treatment: amlodipine 2.5 mg daily Appropriate, Query effective Losartan-hydrochlorothiazide 100/25 mg daily Appropriate, Query effective Metoprolol succinate 200 mg daily Appropriate, Query effective,  -Medications previously tried: none reported  -Current home readings:  28 December 2021: (States tends to be higher in AM before she takes her meds) 119/69 113/62 125/65 126/66 136/76 140/79 137/79 01/14/22: 152/82 01/15/22: 162/87, 127/77 August 2023 02/11/22: 127/74 02/10/22:  139/80 02/09/22: 126/76 02/08/22: 128/72 02/07/22: 139/77 -Current dietary habits: eats breakfast and supper mainly.  -Current exercise habits: Was previously in karate and exercise class. She hopes to rejoin the class soon. Had stopped prior to [place]  -Affirmshypotensive/hypertensive symptoms -Educated on BP goals and benefits of medications for prevention of heart attack, stroke and kidney damage; Daily salt intake goal < 2300 mg; Exercise goal of 150 minutes per week; Importance of home blood pressure monitoring; July 2023: BP is elevated. However, patient states she becomes dizzy at a BP around 1053'[id number]-767systolic. Patient is constantly taking half a pill or skipping doses to prevent herself from becoming dizzy (She had a sever fall last month). She is taking Metoprolol and Losartan at night and Amlodipine in the AM. Counseled to take Amlodipine at night to prevent hypotension during the day and because her BP tends to be fine during the day but will be too high in AM when she tests her BP first thing upon waking/before pills.Will have patient write BP daily and write how/when she took meds and f/u next month. Will also let PCP know August 2023: Switched to taking Amlodpine in PM and BP after meds, readings look great!  Hyperlipidemia: (LDL goal < 100) The 10-year ASCVD risk score (Arnett DK, et al., 2019) is: 28.3%   Values used to calculate the score:     Age: 44 years     Sex: Female     Is Non-Hispanic African American: Yes     Diabetic: Yes     Tobacco smoker: No     Systolic Blood Pressure: 710 mmHg     Is BP treated: Yes     HDL Cholesterol: 53 mg/dL     Total Cholesterol: 159 mg/dL Lab Results  Component Value Date   CHOL 159 11/19/2021   CHOL 222 (H) 08/06/2021   CHOL 162 04/08/2021   Lab Results  Component Value Date   HDL 53 11/19/2021   HDL 52 08/06/2021   HDL 54 04/08/2021   Lab Results  Component Value Date   LDLCALC 86 11/19/2021   LDLCALC 141 (H) 08/06/2021    LDLCALC 90 04/08/2021   Lab Results  Component Value Date   TRIG 108 11/19/2021   TRIG 160 (H) 08/06/2021   TRIG 101 04/08/2021   Lab Results  Component Value Date   CHOLHDL 3.0 11/19/2021   CHOLHDL 4.3 08/06/2021   CHOLHDL 3.0 04/08/2021  No results found for: "LDLDIRECT" Last vitamin D No results found for: "25OHVITD2", "25OHVITD3", "VD25OH" -Controlled -Current treatment: aspirin ec 81 mg daily Appropriate, Effective, Safe, Accessible Crestor (Brand) 10 mg twice weekly Query Appropriate Ezetimibe 39m Query Appropriate, Effective, Safe, Accessible -Medications previously tried: brand name Crestor, atorvastatin -Current dietary patterns: reports breakfast meat and hot dogs  -Current exercise habits: was previously involved in karate and exercise class prior to [place]-Educated on Cholesterol goals;  Benefits of statin for ASCVD risk reduction; Importance of limiting foods high in cholesterol; Exercise goal of 150 minutes per week; Jan 2023: Recommended considering Crestor 10 mg three times weekly. Patient reports she can tolerate brand name Crestor but the generic leaves her achy. Pharmacist to request Dispense as Written for brand name Crestor.  Assessed patient finances. Patient approved for H[employer] Energyto cover cost of Crestor.  July 2023: Patient states she stopped Rosuvastatin once she started Ezetimibe. Still on medslist, will ask PCP about removing. August 2023: PCP removed statin from medslist  Diabetes (A1c goal <8%) Lab Results  Component Value Date   HGBA1C 7.2 (H) 11/19/2021   HGBA1C 6.9 (H) 08/06/2021   HGBA1C 6.8 (H) 04/08/2021   Lab Results  Component Value Date   MICROALBUR 10 12/25/2020   LDLCALC 86 11/19/2021   CREATININE 0.82 11/19/2021   Lab Results  Component Value Date   NA 140 11/19/2021   K 4.1 11/19/2021   CREATININE 0.82 11/19/2021   EGFR 74 11/19/2021   GFRNONAA >60 09/14/2008   GLUCOSE 124 (H) 11/19/2021   Lab Results   Component Value Date   WBC 5.1 11/19/2021   HGB 12.6 11/19/2021   HCT 37.1 11/19/2021   MCV 90 11/19/2021   PLT 309 11/19/2021  -Controlled -Current medications: Metformin xr 500 mg twice daily Appropriate, Effective, Safe, Accessible One touch verio test strips twice daily  Lancets daily prior to meal  -Medications previously tried: Metformin 5021m3/day (ADR's) -Current home glucose readings fasting glucose:  May 2022: 120, 121, 136, 156, 124, 128, 116, 128, 164 (birthday cake), 143, 168 (birthday cake) Jan 2023: Didn't have readings on her July 2023: Didn't have readings on her August 2023 02/11/22: 132 02/09/22: 159 @ 0800, 206 @ 2130, 206 @ 2200 02/08/22: 142 @  1100, 234 @ 1800,  02/07/22: 142 @ 0830, 171 @ 1900 -Denies hypoglycemic/hyperglycemic symptoms -Current meal patterns:  Late breakfast or early lunch: egg, bacon or sausage with grits or pancake occasionally (a few times a month). [place] Clap Croissant once a week as a treat.   dinner: dines out mainly.  snacks:  more birthday cake lately since celebrating all month long),  drinks: unsweet tea, coffee  -Current exercise: limited since not attending exercise class after COVID -Educated on A1c and blood sugar goals; Complications of diabetes including kidney damage, retinal damage, and cardiovascular disease; Exercise goal of 150 minutes per week; Benefits of routine self-monitoring of blood sugar; Carbohydrate counting and/or plate method -Counseled to check feet daily and get yearly eye exams -Counseled on diet and exercise extensively Recommended to continue current medication Jan 2023: Patient doesn't want to increase Metformin to 3/day nor does she want to start Ozempic. Updated medslist and will let PCP know August 2023: Sugars looking better. Counseled to test sugars before eating, not after    Disc Degeneration (Goal: manage pain) -Pain Scale  -Jan 2023: Patient didn't specify, only said she is very  content  -August 2023: Didn't specify pain scale, just that she feels much better -Controlled -Current treatment  tramadol 50 mg bid prn Appropriate, Effective, Safe, Accessible Meloxicam 62m QD PRN Query Appropriate,  IBU 8073mQuery Appropriate,  -Medications previously tried:N/A July 2023: IBU was on patients meds list and Meloxicam wasn't. Patient states she has both but that she doesn't take both. Asked her which she prefers and that we should stick with one. She called back and said she prefers Meloxicam. Updated Medslist and once again counseled not to take together August 2023: Patient very happy with PT, states is helping pain greatly   Patient Goals/Self-Care Activities Patient will:  - take medications as prescribed focus on medication adherence by taking medication as needed check glucose daily, document, and provide at future appointments check blood pressure daily, document, and provide at future appointments target a minimum of 150 minutes of moderate intensity exercise weekly engage in dietary modifications by limiting carbohydrates and fried/fatty foods. Encouraged lean protein, fruits and vegetables.   Follow Up Plan: Telephone follow up appointment with care management team member scheduled for: Feb 2024  [place]. - [phone]        Medication Assistance: N/A   Care Gaps: Last eye exam / Retinopathy Screening? 01/01/21 Last Annual Wellness Visit? 05/14/21 Last Diabetic Foot Exam? 08/06/21     Star Rating Drugs:  Medication:                Last Fill:         Day Supply Metformin                    10/31/21              90ds                                     07/29/21            90ds  [facility]  [facility] [facility] [address] [place]: [phone]: [phone] - [phone]  [address] [place] [address] Phone: [phone] Fax: [phone]  Uses pill box? No - stores together in cabinet and denies missed doses Pt endorses good compliance  We discussed: Benefits of medication synchronization, packaging and delivery as well as enhanced pharmacist oversight with [employer]. Patient decided to: Continue current medication management strategy  Care Plan and Follow Up Patient Decision:  Patient agrees to Care Plan and Follow-up.  Plan: Telephone follow up appointment with care management team member scheduled for:  Feb 2024  [place] Con[place], [place].D. - [phone]

## 2022-02-11 NOTE — Patient Instructions (Signed)
Visit Information   Goals Addressed   None    Patient Care Plan: CCM Pharmacy Care Plan     Problem Identified: dm,htn, hld   Priority: High  Onset Date: 11/22/2020     Long-Range Goal: Disease State Management   Start Date: 11/22/2020  Expected End Date: 11/22/2021  Recent Progress: On track  Priority: High  Note:   Current Barriers:  Unable to independently afford treatment regimen  Pharmacist Clinical Goal(s):  Patient will verbalize ability to afford treatment regimen through collaboration with PharmD and provider.   Interventions: 1:1 collaboration with [name], MD regarding development and update of comprehensive plan of care as evidenced by provider attestation and co-signature Inter-disciplinary care team collaboration (see longitudinal plan of care) Comprehensive medication review performed; medication list updated in electronic medical record  Hypertension (BP goal <140/80) BP Readings from Last 3 Encounters:  12/05/21 122/68  11/19/21 134/72  08/22/21 112/74  -Controlled -Current treatment: amlodipine 2.5 mg daily Appropriate, Query effective Losartan-hydrochlorothiazide 100/25 mg daily Appropriate, Query effective Metoprolol succinate 200 mg daily Appropriate, Query effective,  -Medications previously tried: none reported  -Current home readings:  28 December 2021: (States tends to be higher in AM before she takes her meds) 119/69 113/62 125/65 126/66 136/76 140/79 137/79 01/14/22: 152/82 01/15/22: 162/87, 127/77 August 2023 02/11/22: 127/74 02/10/22: 139/80 02/09/22: 126/76 02/08/22: 128/72 02/07/22: 139/77 -Current dietary habits: eats breakfast and supper mainly.  -Current exercise habits: Was previously in karate and exercise class. She hopes to rejoin the class soon. Had stopped prior to [place].  -Affirmshypotensive/hypertensive symptoms -Educated on BP goals and benefits of medications for prevention of heart attack, stroke and kidney  damage; Daily salt intake goal < 2300 mg; Exercise goal of 150 minutes per week; Importance of home blood pressure monitoring; July 2023: BP is elevated. However, patient states she becomes dizzy at a BP around 416'S-063 systolic. Patient is constantly taking half a pill or skipping doses to prevent herself from becoming dizzy (She had a sever fall last month). She is taking Metoprolol and Losartan at night and Amlodipine in the AM. Counseled to take Amlodipine at night to prevent hypotension during the day and because her BP tends to be fine during the day but will be too high in AM when she tests her BP first thing upon waking/before pills.Will have patient write BP daily and write how/when she took meds and f/u next month. Will also let PCP know August 2023: Switched to taking Amlodpine in PM and BP after meds, readings look great!  Hyperlipidemia: (LDL goal < 100) The 10-year ASCVD risk score (Arnett DK, et al., 2019) is: 28.3%   Values used to calculate the score:     Age: 76 years     Sex: Female     Is Non-Hispanic African American: Yes     Diabetic: Yes     Tobacco smoker: No     Systolic Blood Pressure: 016 mmHg     Is BP treated: Yes     HDL Cholesterol: 53 mg/dL     Total Cholesterol: 159 mg/dL Lab Results  Component Value Date   CHOL 159 11/19/2021   CHOL 222 (H) 08/06/2021   CHOL 162 04/08/2021   Lab Results  Component Value Date   HDL 53 11/19/2021   HDL 52 08/06/2021   HDL 54 04/08/2021   Lab Results  Component Value Date   LDLCALC 86 11/19/2021   LDLCALC 141 (H) 08/06/2021   [place] 90 04/08/2021  Lab Results  Component Value Date   TRIG 108 11/19/2021   TRIG 160 (H) 08/06/2021   TRIG 101 04/08/2021   Lab Results  Component Value Date   CHOLHDL 3.0 11/19/2021   CHOLHDL 4.3 08/06/2021   CHOLHDL 3.0 04/08/2021  No results found for: "LDLDIRECT" Last vitamin D No results found for: "25OHVITD2", "25OHVITD3", "VD25OH" -Controlled -Current  treatment: aspirin ec 81 mg daily Appropriate, Effective, Safe, Accessible Crestor (Brand) 10 mg twice weekly Query Appropriate Ezetimibe 84m Query Appropriate, Effective, Safe, Accessible -Medications previously tried: brand name Crestor, atorvastatin -Current dietary patterns: reports breakfast meat and hot dogs  -Current exercise habits: was previously involved in karate and exercise class prior to [place] Cholesterol goals;  Benefits of statin for ASCVD risk reduction; Importance of limiting foods high in cholesterol; Exercise goal of 150 minutes per week; Jan 2023: Recommended considering Crestor 10 mg three times weekly. Patient reports she can tolerate brand name Crestor but the generic leaves her achy. Pharmacist to request Dispense as Written for brand name Crestor.  Assessed patient finances. Patient approved for [employer] cover cost of Crestor.  July 2023: Patient states she stopped Rosuvastatin once she started Ezetimibe. Still on medslist, will ask PCP about removing. August 2023: PCP removed statin from medslist  Diabetes (A1c goal <8%) Lab Results  Component Value Date   HGBA1C 7.2 (H) 11/19/2021   HGBA1C 6.9 (H) 08/06/2021   HGBA1C 6.8 (H) 04/08/2021   Lab Results  Component Value Date   MICROALBUR 10 12/25/2020   LDLCALC 86 11/19/2021   CREATININE 0.82 11/19/2021   Lab Results  Component Value Date   NA 140 11/19/2021   K 4.1 11/19/2021   CREATININE 0.82 11/19/2021   EGFR 74 11/19/2021   GFRNONAA >60 09/14/2008   GLUCOSE 124 (H) 11/19/2021   Lab Results  Component Value Date   WBC 5.1 11/19/2021   HGB 12.6 11/19/2021   HCT 37.1 11/19/2021   MCV 90 11/19/2021   PLT 309 11/19/2021  -Controlled -Current medications: Metformin xr 500 mg twice daily Appropriate, Effective, Safe, Accessible One touch verio test strips twice daily  Lancets daily prior to meal  -Medications previously tried: Metformin 5058m3/day (ADR's) -Current home  glucose readings fasting glucose:  May 2022: 120, 121, 136, 156, 124, 128, 116, 128, 164 (birthday cake), 143, 168 (birthday cake) Jan 2023: Didn't have readings on her July 2023: Didn't have readings on her August 2023 02/11/22: 132 02/09/22: 159 @ 0800, 206 @ 2130, 206 @ 2200 02/08/22: 142 @ 1100, 234 @ 1800,  02/07/22: 142 @ 0830, 171 @ 1900 -Denies hypoglycemic/hyperglycemic symptoms -Current meal patterns:  Late breakfast or early lunch: egg, bacon or sausage with grits or pancake occasionally (a few times a month). [place] Claproissant once a week as a treat.   dinner: dines out mainly.  snacks:  more birthday cake lately since celebrating all month long),  drinks: unsweet tea, coffee  -Current exercise: limited since not attending exercise class after COVID -Educated on A1c and blood sugar goals; Complications of diabetes including kidney damage, retinal damage, and cardiovascular disease; Exercise goal of 150 minutes per week; Benefits of routine self-monitoring of blood sugar; Carbohydrate counting and/or plate method -Counseled to check feet daily and get yearly eye exams -Counseled on diet and exercise extensively Recommended to continue current medication Jan 2023: Patient doesn't want to increase Metformin to 3/day nor does she want to start Ozempic. Updated medslist and will let PCP know August  2023: Sugars looking better. Counseled to test sugars before eating, not after    Disc Degeneration (Goal: manage pain) -Pain Scale  -Jan 2023: Patient didn't specify, only said she is very content  -August 2023: Didn't specify pain scale, just that she feels much better -Controlled -Current treatment  tramadol 50 mg bid prn Appropriate, Effective, Safe, Accessible Meloxicam 15mg QD PRN Query Appropriate,  IBU 800mg Query Appropriate,  -Medications previously tried:N/A July 2023: IBU was on patients meds list and Meloxicam wasn't. Patient states she has both but that she  doesn't take both. Asked her which she prefers and that we should stick with one. She called back and said she prefers Meloxicam. Updated Medslist and once again counseled not to take together August 2023: Patient very happy with PT, states is helping pain greatly   Patient Goals/Self-Care Activities Patient will:  - take medications as prescribed focus on medication adherence by taking medication as needed check glucose daily, document, and provide at future appointments check blood pressure daily, document, and provide at future appointments target a minimum of 150 minutes of moderate intensity exercise weekly engage in dietary modifications by limiting carbohydrates and fried/fatty foods. Encouraged lean protein, fruits and vegetables.   Follow Up Plan: Telephone follow up appointment with care management team member scheduled for: Feb 2024  Nathan Kennedy, Pharm.D. - [phone]      Ms. Quintin was given information about Chronic Care Management services today including:  CCM service includes personalized support from designated clinical staff supervised by her physician, including individualized plan of care and coordination with other care providers 24/7 contact phone numbers for assistance for urgent and routine care needs. Standard insurance, coinsurance, copays and deductibles apply for chronic care management only during months in which we provide at least 20 minutes of these services. Most insurances cover these services at 100%, however patients may be responsible for any copay, coinsurance and/or deductible if applicable. This service may help you avoid the need for more expensive face-to-face services. Only one practitioner may furnish and bill the service in a calendar month. The patient may stop CCM services at any time (effective at the end of the month) by phone call to the office staff.  Patient agreed to services and verbal consent obtained.   The patient  verbalized understanding of instructions, educational materials, and care plan provided today and DECLINED offer to receive copy of patient instructions, educational materials, and care plan.  The pharmacy team will reach out to the patient again over the next 60 days.   Nathan K Kennedy, RPH [phone]  

## 2022-02-12 DIAGNOSIS — M6281 Muscle weakness (generalized): Secondary | ICD-10-CM | POA: Diagnosis not present

## 2022-02-12 DIAGNOSIS — M542 Cervicalgia: Secondary | ICD-10-CM | POA: Diagnosis not present

## 2022-02-12 DIAGNOSIS — M2560 Stiffness of unspecified joint, not elsewhere classified: Secondary | ICD-10-CM | POA: Diagnosis not present

## 2022-02-12 DIAGNOSIS — M5489 Other dorsalgia: Secondary | ICD-10-CM | POA: Diagnosis not present

## 2022-02-17 DIAGNOSIS — M12811 Other specific arthropathies, not elsewhere classified, right shoulder: Secondary | ICD-10-CM | POA: Diagnosis not present

## 2022-02-17 DIAGNOSIS — M75101 Unspecified rotator cuff tear or rupture of right shoulder, not specified as traumatic: Secondary | ICD-10-CM | POA: Diagnosis not present

## 2022-02-17 DIAGNOSIS — M25511 Pain in right shoulder: Secondary | ICD-10-CM | POA: Diagnosis not present

## 2022-02-19 DIAGNOSIS — M2560 Stiffness of unspecified joint, not elsewhere classified: Secondary | ICD-10-CM | POA: Diagnosis not present

## 2022-02-19 DIAGNOSIS — M542 Cervicalgia: Secondary | ICD-10-CM | POA: Diagnosis not present

## 2022-02-19 DIAGNOSIS — M6281 Muscle weakness (generalized): Secondary | ICD-10-CM | POA: Diagnosis not present

## 2022-02-19 DIAGNOSIS — M5489 Other dorsalgia: Secondary | ICD-10-CM | POA: Diagnosis not present

## 2022-02-27 DIAGNOSIS — E785 Hyperlipidemia, unspecified: Secondary | ICD-10-CM

## 2022-02-27 DIAGNOSIS — E1159 Type 2 diabetes mellitus with other circulatory complications: Secondary | ICD-10-CM

## 2022-02-27 DIAGNOSIS — I10 Essential (primary) hypertension: Secondary | ICD-10-CM

## 2022-02-27 DIAGNOSIS — Z7984 Long term (current) use of oral hypoglycemic drugs: Secondary | ICD-10-CM

## 2022-02-27 NOTE — Assessment & Plan Note (Signed)
Well controlled.  No changes to medicines. Zetia 10 mg daily, Fish Oil 1000 mg daily. Continue to work on eating a healthy diet and exercise.  Labs drawn today.  

## 2022-02-27 NOTE — Assessment & Plan Note (Signed)
The current medical regimen is effective;  continue present plan and medications.  Omeprazole 20 mg daily. 

## 2022-02-27 NOTE — Assessment & Plan Note (Signed)
The current medical regimen is effective;  continue present plan and medications.  

## 2022-02-27 NOTE — Assessment & Plan Note (Signed)
Well controlled.  No changes to medicines. Amlodipine 5 mg daily, Aspirin 81 mg daily, Metoprolol 200 mg every day, Losartan-Hctz 100-25 mg daily Continue to work on eating a healthy diet and exercise.  Labs drawn today.

## 2022-02-27 NOTE — Assessment & Plan Note (Signed)
Control: Good Recommend check sugars fasting daily. Recommend check feet daily. Recommend annual eye exams. Medicines: Metformin 500 mg Twice a day. Continue to work on eating a healthy diet and exercise.  Labs drawn today.

## 2022-02-27 NOTE — Assessment & Plan Note (Signed)
continue with CPAP.

## 2022-02-27 NOTE — Progress Notes (Unsigned)
Subjective:  Patient ID: Mindy Ingram, female    DOB: 07-Jul-1945  Age: 76 y.o. MRN: [id number]  Chief Complaint  Patient presents with   Diabetes   Hyperlipidemia    HPI Diabetes:  Complications: Glucose checking:  Glucose logs:80-130 prior to ESIs. Since then 150s-320. Improving now 150-200.   Hypoglycemia: no  Most recent A1C: 7.2% Current medications: Metformin 500 mg Twice a day. Last Eye Exam: 01/21/2022 Foot checks: daily  Hyperlipidemia: Current medications: Zetia 10 mg daily, Fish Oil 1000 mg daily. Intolerant to statins.  GERD: Taking Omeprazole 20 mg daily.  Hypertension: Complications: Current medications: Amlodipine 5 mg daily, Aspirin 81 mg daily, Metoprolol 200 mg every day, Losartan-Hctz 100-25 mg daily.  Diet: not eating a healthy diet.  Exercise:  in therapy for back and torn rotator cuff. Patient sees Dr Samule Dry for her right shoulder. Exercising also.  Current Outpatient Medications on File Prior to Visit  Medication Sig Dispense Refill   acyclovir (ZOVIRAX) 400 MG tablet Take 1 tablet (400 mg total) by mouth 2 (two) times daily as needed (outbreak). 100 tablet 3   albuterol (VENTOLIN HFA) 108 (90 Base) MCG/ACT inhaler Inhale 2 puffs into the lungs every 6 (six) hours as needed for wheezing or shortness of breath.     amLODipine (NORVASC) 5 MG tablet TAKE ONE-HALF TABLET BY MOUTH  DAILY 45 tablet 1   aspirin EC 81 MG tablet Take 81 mg by mouth daily.     azelastine (ASTELIN) 0.1 % nasal spray Place 1 spray into both nostrils 2 (two) times daily as needed for allergies or rhinitis.     Blood Glucose Monitoring Suppl (ONE TOUCH ULTRA 2) w/Device KIT Use as instructed five times daily. E11.69 1 kit 0   cetirizine (ZYRTEC) 10 MG tablet Take 10 mg by mouth daily as needed for allergies.     Cholecalciferol (VITAMIN D PO) Take 2,000 Units by mouth daily.      Elderberry 575 MG/5ML SYRP Take 5 mLs by mouth daily.     ezetimibe (ZETIA) 10 MG tablet TAKE 1  TABLET BY MOUTH DAILY 90 tablet 1   fluticasone (FLONASE) 50 MCG/ACT nasal spray USE 1 SPRAY IN EACH NOSTRIL EVERY DAY 48 g 1   glucose blood (ONETOUCH ULTRA) test strip Use as instructed five times daily. 100 each 12   ibuprofen (ADVIL) 800 MG tablet Take 800 mg by mouth every 8 (eight) hours as needed for headache.     Lancets (ONETOUCH ULTRASOFT) lancets Use as instructed five times daily. 100 each 12   losartan-hydrochlorothiazide (HYZAAR) 100-25 MG tablet TAKE 1 TABLET BY MOUTH DAILY 90 tablet 3   meloxicam (MOBIC) 7.5 MG tablet Take 7.5 mg by mouth daily as needed.     metFORMIN (GLUCOPHAGE-XR) 500 MG 24 hr tablet TAKE 1 TABLET BY MOUTH TWICE  DAILY 180 tablet 1   methocarbamol (ROBAXIN) 500 MG tablet Take 500 mg by mouth 2 (two) times daily.     metoprolol (TOPROL-XL) 200 MG 24 hr tablet TAKE 1 TABLET BY MOUTH EVERY DAY 90 tablet 1   Multiple Vitamins-Minerals (COMPLETE ENERGY) TABS Take 1 tablet by mouth daily.     nitroGLYCERIN (NITROSTAT) 0.4 MG SL tablet Place 1 tablet (0.4 mg total) under the tongue every 5 (five) minutes as needed for chest pain. 30 tablet 4   Omega-3 Fatty Acids (FISH OIL) 1000 MG CAPS Take 1 capsule by mouth daily.     omeprazole (PRILOSEC) 20 MG capsule TAKE 1 CAPSULE  BY MOUTH DAILY 90 capsule 1   triamcinolone ointment (KENALOG) 0.1 % Apply 1 application topically 2 (two) times daily. 80 g 0   Turmeric (QC TUMERIC COMPLEX PO) Take 1,000 mg by mouth daily.     traMADol (ULTRAM) 50 MG tablet Take 50 mg by mouth 2 (two) times daily as needed for pain. (Patient not taking: Reported on 02/28/2022)     Current Facility-Administered Medications on File Prior to Visit  Medication Dose Route Frequency Provider Last Rate Last Admin   triamcinolone acetonide (KENALOG-40) injection 60 mg  60 mg Intramuscular Once [name], PA-C       Past Medical History:  Diagnosis Date   Acquired hammer toe of right foot 08/11/2017   Allergic asthma, mild intermittent, uncomplicated  6/76/1950   Office Spirometry 04/03/2015- WNL FVC 2.14/110%, FEV1 1.94/128%, FEV1/FVC 0.91  Office spirometry- 11/04/16--by allergy office- FEV1/FVC 0.77 WNL   Alopecia 06/25/2020   Asthma    Asthmatic bronchitis, mild intermittent, with acute exacerbation 10/11/2007   Qualifier: Diagnosis of  By: Annamaria Boots MD, [place] D    Bilateral impacted cerumen 07/01/2018   Body mass index (BMI) 30.0-30.9, adult 07/10/2021   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   Chronic back pain    Contusion of head, initial encounter 03/18/2021   [place] of toe 06/25/2020   Costochondritis 06/25/2020   Cramp in lower leg associated with rest 06/25/2020   Degeneration of lumbosacral intervertebral disc 01/26/2012   DJD (degenerative joint disease)    DM type 2 with diabetic mixed hyperlipidemia ([place]) 04/08/2021   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   Hyperlipidemia 06/25/2020   Lateral epicondylitis 06/25/2020   Light headedness 03/18/2021   Low back pain 01/26/2012   Menopause 06/25/2020   Multiple benign melanocytic nevi 06/25/2020   Neck muscle spasm 04/26/2021   Obesity 07/10/2021   OSA (obstructive sleep apnea)    cpap setting of 10   Other long term (current) drug therapy 06/25/2020   Seasonal and perennial allergic rhinitis 10/17/2007   Allergy vaccine 2002- dc'd   Shoulder joint pain 06/25/2020   Sleep apnea    Type II or unspecified type diabetes mellitus without mention of complication, not stated as uncontrolled    Upper airway cough syndrome 09/24/2015   Vitamin D deficiency 06/25/2020   Past Surgical History:  Procedure Laterality Date   [place]  2013   lower back with plates and screws   benign lump right axilla     [place]   at [facility], slight limitation with turning neck   CHOLECYSTECTOMY  1995   EUS N/A 07/21/2013   Procedure: UPPER ENDOSCOPIC ULTRASOUND (EUS) LINEAR;  Surgeon: Milus Banister, MD;  Location: [facility];  Service: Endoscopy;  Laterality: N/A;   FOOT SURGERY     2007 - right great toe, 2000 - right fifth digit .   NEUROPLASTY / TRANSPOSITION MEDIAN NERVE AT [place]   PARTIAL COLECTOMY  1995   benign adhesions   PARTIAL HYSTERECTOMY  1980s.   abdominal   right foot surgery  2000   right great toe with artificial bone inserted  right knee arthroscopy Right 02/2017    Family History  Problem Relation Age of Onset   Heart disease Mother    Prostate cancer Father    Heart attack Sister    Breast cancer Maternal Aunt    Prostate cancer Paternal Uncle    Allergies Neg Hx    Asthma Neg Hx    Eczema Neg Hx    Immunodeficiency Neg Hx    Social History   Socioeconomic History   Marital status: Widowed    Spouse name: Not on file   Number of children: 1   Years of education: Not on file   Highest education level: Not on file  Occupational History   Occupation: Retired  Tobacco Use   Smoking status: Never   Smokeless tobacco: Never  Vaping Use   Vaping Use: Never used  Substance and Sexual Activity   Alcohol use: No   Drug use: No   Sexual activity: Not on file  Other Topics Concern   Not on file  Social History Narrative   Not on file   Social Determinants of Health   Financial Resource Strain: Low Risk  (02/11/2022)   Overall Financial Resource Strain (CARDIA)    Difficulty of Paying Living Expenses: Not hard at all  Food Insecurity: No Food Insecurity (11/21/2020)   Hunger Vital Sign    Worried About Running Out of Food in the Last Year: Never true    [place] in the Last Year: Never true  Transportation Needs: No Transportation Needs (02/11/2022)   PRAPARE - Armed forces logistics/support/administrative officer (Medical): No    Lack of Transportation (Non-Medical): No  Physical Activity: Not on file  Stress: Not on file  Social Connections: Not on file    Review of Systems  Constitutional:  Negative for chills, fatigue and fever.  HENT:  Negative for congestion, rhinorrhea and sore throat.   Respiratory:  Negative for cough and shortness of breath.   Cardiovascular:  Negative for chest pain.  Gastrointestinal:  Negative for abdominal pain, constipation, diarrhea, nausea and vomiting.  Genitourinary:  Negative for dysuria and urgency.  Musculoskeletal:  Positive for arthralgias, back pain and myalgias.  Neurological:  Positive for light-headedness. Negative for dizziness, weakness and headaches.  Psychiatric/Behavioral:  Negative for dysphoric mood. The patient is not nervous/anxious.      Objective:  BP 116/72   Pulse 72   Temp (!) 97 F (36.1 C)   Resp 16   Ht 4' 11"  (1.499 m)   Wt 150 lb (68 kg)   BMI 30.30 kg/m      02/28/2022    9:12 AM 12/05/2021    2:51 PM 11/19/2021    9:17 AM  BP/Weight  Systolic BP 469 629 528  Diastolic BP 72 68 72  Wt. (Lbs) 150 151 151  BMI 30.3 kg/m2 32.68 kg/m2 32.68 kg/m2    Physical Exam  Diabetic Foot Exam - Simple   No data filed      Lab Results  Component Value Date   WBC 5.1 11/19/2021   HGB 12.6 11/19/2021   HCT 37.1 11/19/2021   PLT 309 11/19/2021   GLUCOSE 124 (H) 11/19/2021   CHOL 159 11/19/2021   TRIG 108 11/19/2021   HDL 53 11/19/2021   LDLCALC 86 11/19/2021   ALT 16 11/19/2021   AST 22 11/19/2021   NA 140 11/19/2021   K 4.1 11/19/2021   CL 102 11/19/2021   CREATININE 0.82 11/19/2021  BUN 11 11/19/2021   CO2 25 11/19/2021   TSH 1.370 11/19/2021   HGBA1C 7.2 (H) 11/19/2021   MICROALBUR 10 12/25/2020      Assessment & Plan:   Problem List Items Addressed This Visit       Cardiovascular and Mediastinum   Hypertension associated with diabetes ([place]) - Primary    Well controlled.  No  changes to medicines. Amlodipine 5 mg daily, Aspirin 81 mg daily, Metoprolol 200 mg every day, Losartan-Hctz 100-25 mg daily Continue to work on eating a healthy diet and exercise.  Labs drawn today.         Respiratory   OSA (obstructive sleep apnea)    continue with CPAP.        Digestive   GERD    The current medical regimen is effective;  continue present plan and medications. Omeprazole 20 mg daily.        Endocrine   DM type 2 with diabetic mixed hyperlipidemia ([facility])    Control: Good Recommend check sugars fasting daily. Recommend check feet daily. Recommend annual eye exams. Medicines: Metformin 500 mg Twice a day. Continue to work on eating a healthy diet and exercise.  Labs drawn today.          Other   Hyperlipidemia    Well controlled.  No changes to medicines. Zetia 10 mg daily, Fish Oil 1000 mg daily. Continue to work on eating a healthy diet and exercise.  Labs drawn today.       Fibromyalgia    The current medical regimen is effective;  continue present plan and medications.     .  No orders of the defined types were placed in this encounter.   No orders of the defined types were placed in this encounter.    Follow-up: No follow-ups on file.  An After Visit Summary was printed and given to the patient.  [name], MD [facility] [phone]

## 2022-02-28 ENCOUNTER — Ambulatory Visit (INDEPENDENT_AMBULATORY_CARE_PROVIDER_SITE_OTHER): Payer: Medicare Other | Admitting: Family Medicine

## 2022-02-28 VITALS — BP 116/72 | HR 72 | Temp 97.0°F | Resp 16 | Ht 59.0 in | Wt 150.0 lb

## 2022-02-28 DIAGNOSIS — I152 Hypertension secondary to endocrine disorders: Secondary | ICD-10-CM | POA: Diagnosis not present

## 2022-02-28 DIAGNOSIS — E782 Mixed hyperlipidemia: Secondary | ICD-10-CM

## 2022-02-28 DIAGNOSIS — E1169 Type 2 diabetes mellitus with other specified complication: Secondary | ICD-10-CM

## 2022-02-28 DIAGNOSIS — M791 Myalgia, unspecified site: Secondary | ICD-10-CM

## 2022-02-28 DIAGNOSIS — K219 Gastro-esophageal reflux disease without esophagitis: Secondary | ICD-10-CM | POA: Diagnosis not present

## 2022-02-28 DIAGNOSIS — G4733 Obstructive sleep apnea (adult) (pediatric): Secondary | ICD-10-CM | POA: Diagnosis not present

## 2022-02-28 DIAGNOSIS — M797 Fibromyalgia: Secondary | ICD-10-CM

## 2022-02-28 DIAGNOSIS — E1159 Type 2 diabetes mellitus with other circulatory complications: Secondary | ICD-10-CM | POA: Diagnosis not present

## 2022-02-28 DIAGNOSIS — T466X5A Adverse effect of antihyperlipidemic and antiarteriosclerotic drugs, initial encounter: Secondary | ICD-10-CM | POA: Diagnosis not present

## 2022-02-28 MED ORDER — METHOCARBAMOL 500 MG PO TABS: 500.0000 mg | ORAL_TABLET | Freq: Two times a day (BID) | ORAL | 0 refills | Status: DC

## 2022-02-28 NOTE — Patient Instructions (Signed)
[facility] for cramps or tonic water.

## 2022-03-01 LAB — LIPID PANEL
Chol/HDL Ratio: 3.6 ratio (ref 0.0–4.4)
Cholesterol, Total: 172 mg/dL (ref 100–199)
HDL: 48 mg/dL (ref >39)
LDL Chol Calc (NIH): 96 mg/dL (ref 0–99)
Triglycerides: 158 mg/dL — ABNORMAL HIGH (ref 0–149)
VLDL Cholesterol Cal: 28 mg/dL (ref 5–40)

## 2022-03-01 LAB — COMPREHENSIVE METABOLIC PANEL
ALT: 17 IU/L (ref 0–32)
AST: 21 IU/L (ref 0–40)
Albumin/Globulin Ratio: 1.9 (ref 1.2–2.2)
Albumin: 4.4 g/dL (ref 3.8–4.8)
Alkaline Phosphatase: 44 IU/L (ref 44–121)
BUN/Creatinine Ratio: 20 (ref 12–28)
BUN: 17 mg/dL (ref 8–27)
Bilirubin Total: <0.2 mg/dL (ref 0.0–1.2)
CO2: 26 mmol/L (ref 20–29)
Calcium: 9.6 mg/dL (ref 8.7–10.3)
Chloride: 99 mmol/L (ref 96–106)
Creatinine, Ser: 0.84 mg/dL (ref 0.57–1.00)
Globulin, Total: 2.3 g/dL (ref 1.5–4.5)
Glucose: 122 mg/dL — ABNORMAL HIGH (ref 70–99)
Potassium: 3.9 mmol/L (ref 3.5–5.2)
Sodium: 142 mmol/L (ref 134–144)
Total Protein: 6.7 g/dL (ref 6.0–8.5)
eGFR: 72 mL/min/{1.73_m2} (ref >59)

## 2022-03-01 LAB — CBC WITH DIFFERENTIAL/PLATELET
Basophils Absolute: 0 10*3/uL (ref 0.0–0.2)
Basos: 1 %
EOS (ABSOLUTE): 0.1 10*3/uL (ref 0.0–0.4)
Eos: 1 %
Hematocrit: 39.1 % (ref 34.0–46.6)
Hemoglobin: 12.8 g/dL (ref 11.1–15.9)
Immature Grans (Abs): 0 10*3/uL (ref 0.0–0.1)
Immature Granulocytes: 0 %
Lymphocytes Absolute: 2.1 10*3/uL (ref 0.7–3.1)
Lymphs: 41 %
MCH: 30 pg (ref 26.6–33.0)
MCHC: 32.7 g/dL (ref 31.5–35.7)
MCV: 92 fL (ref 79–97)
Monocytes Absolute: 0.4 10*3/uL (ref 0.1–0.9)
Monocytes: 8 %
Neutrophils Absolute: 2.5 10*3/uL (ref 1.4–7.0)
Neutrophils: 49 %
Platelets: 298 10*3/uL (ref 150–450)
RBC: 4.26 x10E6/uL (ref 3.77–5.28)
RDW: 13.2 % (ref 11.7–15.4)
WBC: 5.1 10*3/uL (ref 3.4–10.8)

## 2022-03-01 LAB — HEMOGLOBIN A1C
Est. average glucose Bld gHb Est-mCnc: 166 mg/dL
Hgb A1c MFr Bld: 7.4 % — ABNORMAL HIGH (ref 4.8–5.6)

## 2022-03-01 LAB — MAGNESIUM: Magnesium: 1.8 mg/dL (ref 1.6–2.3)

## 2022-03-01 LAB — PHOSPHORUS: Phosphorus: 4 mg/dL (ref 3.0–4.3)

## 2022-03-03 ENCOUNTER — Encounter: Payer: Self-pay | Admitting: Family Medicine

## 2022-03-03 DIAGNOSIS — T466X5A Adverse effect of antihyperlipidemic and antiarteriosclerotic drugs, initial encounter: Secondary | ICD-10-CM

## 2022-03-03 DIAGNOSIS — M791 Myalgia, unspecified site: Secondary | ICD-10-CM

## 2022-03-03 HISTORY — DX: Adverse effect of antihyperlipidemic and antiarteriosclerotic drugs, initial encounter: T46.6X5A

## 2022-03-03 HISTORY — DX: Myalgia, unspecified site: M79.10

## 2022-03-03 NOTE — Assessment & Plan Note (Signed)
Intolerant to statins. 

## 2022-03-04 ENCOUNTER — Other Ambulatory Visit: Payer: Self-pay

## 2022-03-04 DIAGNOSIS — M791 Myalgia, unspecified site: Secondary | ICD-10-CM

## 2022-03-04 MED ORDER — METFORMIN HCL ER 500 MG PO TB24: ORAL_TABLET | ORAL | 3 refills | Status: DC

## 2022-03-21 ENCOUNTER — Other Ambulatory Visit: Payer: Self-pay | Admitting: Family Medicine

## 2022-04-15 ENCOUNTER — Ambulatory Visit: Payer: Medicare Other

## 2022-04-16 ENCOUNTER — Telehealth: Payer: Self-pay

## 2022-04-16 ENCOUNTER — Ambulatory Visit (INDEPENDENT_AMBULATORY_CARE_PROVIDER_SITE_OTHER): Payer: Medicare Other

## 2022-04-16 DIAGNOSIS — Z1231 Encounter for screening mammogram for malignant neoplasm of breast: Secondary | ICD-10-CM

## 2022-04-16 DIAGNOSIS — Z23 Encounter for immunization: Secondary | ICD-10-CM

## 2022-04-16 NOTE — Addendum Note (Signed)
Addended by: Thompson Caul I on: 04/16/2022 12:58 PM   Modules accepted: Orders

## 2022-04-16 NOTE — Progress Notes (Signed)
Chronic Care Management Pharmacy Assistant   Name: Mindy Ingram  MRN: [id number] DOB: Jun 24, 1946   Reason for Encounter: Disease State call for HTN   Recent office visits:  02/28/22 [name] MD. Seen for routine visit. D/C Rosuvastatin Calcium 25m.   Recent consult visits:  None  Hospital visits:  None  Medications: Outpatient Encounter Medications as of 04/16/2022  Medication Sig   acyclovir (ZOVIRAX) 400 MG tablet Take 1 tablet (400 mg total) by mouth 2 (two) times daily as needed (outbreak).   albuterol (VENTOLIN HFA) 108 (90 Base) MCG/ACT inhaler Inhale 2 puffs into the lungs every 6 (six) hours as needed for wheezing or shortness of breath.   amLODipine (NORVASC) 5 MG tablet TAKE ONE-HALF TABLET BY MOUTH  DAILY   aspirin EC 81 MG tablet Take 81 mg by mouth daily.   azelastine (ASTELIN) 0.1 % nasal spray Place 1 spray into both nostrils 2 (two) times daily as needed for allergies or rhinitis.   Blood Glucose Monitoring Suppl (ONE TOUCH ULTRA 2) w/Device KIT Use as instructed five times daily. E11.69   cetirizine (ZYRTEC) 10 MG tablet Take 10 mg by mouth daily as needed for allergies.   Cholecalciferol (VITAMIN D PO) Take 2,000 Units by mouth daily.    Elderberry 575 MG/5ML SYRP Take 5 mLs by mouth daily.   ezetimibe (ZETIA) 10 MG tablet TAKE 1 TABLET BY MOUTH DAILY   fluticasone (FLONASE) 50 MCG/ACT nasal spray USE 1 SPRAY IN EACH NOSTRIL EVERY DAY   glucose blood (ONETOUCH ULTRA) test strip Use as instructed five times daily.   ibuprofen (ADVIL) 800 MG tablet Take 800 mg by mouth every 8 (eight) hours as needed for headache.   Lancets (ONETOUCH ULTRASOFT) lancets Use as instructed five times daily.   losartan-hydrochlorothiazide (HYZAAR) 100-25 MG tablet TAKE 1 TABLET BY MOUTH DAILY   meloxicam (MOBIC) 7.5 MG tablet Take 7.5 mg by mouth daily as needed.   metFORMIN (GLUCOPHAGE-XR) 500 MG 24 hr tablet TAKE 1 TABLET BY MOUTH TWICE  DAILY   methocarbamol (ROBAXIN)  500 MG tablet Take 1 tablet (500 mg total) by mouth 2 (two) times daily.   metoprolol (TOPROL-XL) 200 MG 24 hr tablet TAKE 1 TABLET BY MOUTH EVERY DAY   Multiple Vitamins-Minerals (COMPLETE ENERGY) TABS Take 1 tablet by mouth daily.   nitroGLYCERIN (NITROSTAT) 0.4 MG SL tablet Place 1 tablet (0.4 mg total) under the tongue every 5 (five) minutes as needed for chest pain.   Omega-3 Fatty Acids (FISH OIL) 1000 MG CAPS Take 1 capsule by mouth daily.   omeprazole (PRILOSEC) 20 MG capsule TAKE 1 CAPSULE BY MOUTH DAILY   traMADol (ULTRAM) 50 MG tablet Take 50 mg by mouth 2 (two) times daily as needed for pain. (Patient not taking: Reported on 02/28/2022)   triamcinolone ointment (KENALOG) 0.1 % Apply 1 application topically 2 (two) times daily.   Turmeric (QC TUMERIC COMPLEX PO) Take 1,000 mg by mouth daily.   Facility-Administered Encounter Medications as of 04/16/2022  Medication   triamcinolone acetonide (KENALOG-40) injection 60 mg     Recent Office Vitals: BP Readings from Last 3 Encounters:  02/28/22 116/72  12/05/21 122/68  11/19/21 134/72   Pulse Readings from Last 3 Encounters:  02/28/22 72  12/05/21 63  11/19/21 68    Wt Readings from Last 3 Encounters:  02/28/22 150 lb (68 kg)  12/05/21 151 lb (68.5 kg)  11/19/21 151 lb (68.5 kg)     Kidney Function Lab  Results  Component Value Date/Time   CREATININE 0.84 02/28/2022 09:50 AM   CREATININE 0.82 11/19/2021 10:04 AM   GFRNONAA >60 09/14/2008 03:58 PM   GFRAA  09/14/2008 03:58 PM    >60        The eGFR has been calculated using the MDRD equation. This calculation has not been validated in all clinical situations. eGFR's persistently <60 mL/min signify possible Chronic Kidney Disease.       Latest Ref Rng & Units 02/28/2022    9:50 AM 11/19/2021   10:04 AM 08/06/2021   10:51 AM  BMP  Glucose 70 - 99 mg/dL 122  124  124   BUN 8 - 27 mg/dL 17  11  14    Creatinine 0.57 - 1.00 mg/dL 0.84  0.82  0.97   BUN/Creat Ratio  12 - 28 20  13  14    Sodium 134 - 144 mmol/L 142  140  140   Potassium 3.5 - 5.2 mmol/L 3.9  4.1  4.0   Chloride 96 - 106 mmol/L 99  102  98   CO2 20 - 29 mmol/L 26  25  25    Calcium 8.7 - 10.3 mg/dL 9.6  9.4  10.0      Current antihypertensive regimen:  Amlodipine 2.5 mg daily  Losartan-hydrochlorothiazide 100/25 mg daily  Metoprolol succinate 200 mg daily   Adherence Review: Is the patient currently on ACE/ARB medication? Yes Does the patient have >5 day gap between last estimated fill dates? CPP to review  Care Gaps: Last annual wellness visit? Scheduled 05/27/22  Star Rating Drugs:  Medication:  Last Fill: Day Supply  Losartan  03/05/22 100ds- 12/19/21 90ds   Unable to reach pt to complete this call   Elray Mcgregor, [place] Pharmacist Assistant  [phone]

## 2022-05-01 ENCOUNTER — Ambulatory Visit: Payer: BC Managed Care – PPO | Admitting: Internal Medicine

## 2022-05-07 ENCOUNTER — Telehealth: Payer: Self-pay

## 2022-05-07 NOTE — Progress Notes (Signed)
Chronic Care Management Pharmacy Assistant   Name: Mindy Ingram  MRN: [id number] DOB: [birth year]   Reason for Encounter: Disease State call for DM    Recent office visits:  None  Recent consult visits:  None  Hospital visits:  None  Medications: Outpatient Encounter Medications as of 05/07/2022  Medication Sig   acyclovir (ZOVIRAX) 400 MG tablet Take 1 tablet (400 mg total) by mouth 2 (two) times daily as needed (outbreak).   albuterol (VENTOLIN HFA) 108 (90 Base) MCG/ACT inhaler Inhale 2 puffs into the lungs every 6 (six) hours as needed for wheezing or shortness of breath.   amLODipine (NORVASC) 5 MG tablet TAKE ONE-HALF TABLET BY MOUTH  DAILY   aspirin EC 81 MG tablet Take 81 mg by mouth daily.   azelastine (ASTELIN) 0.1 % nasal spray Place 1 spray into both nostrils 2 (two) times daily as needed for allergies or rhinitis.   Blood Glucose Monitoring Suppl (ONE TOUCH ULTRA 2) w/Device KIT Use as instructed five times daily. E11.69   cetirizine (ZYRTEC) 10 MG tablet Take 10 mg by mouth daily as needed for allergies.   Cholecalciferol (VITAMIN D PO) Take 2,000 Units by mouth daily.    Elderberry 575 MG/5ML SYRP Take 5 mLs by mouth daily.   ezetimibe (ZETIA) 10 MG tablet TAKE 1 TABLET BY MOUTH DAILY   fluticasone (FLONASE) 50 MCG/ACT nasal spray USE 1 SPRAY IN EACH NOSTRIL EVERY DAY   glucose blood (ONETOUCH ULTRA) test strip Use as instructed five times daily.   ibuprofen (ADVIL) 800 MG tablet Take 800 mg by mouth every 8 (eight) hours as needed for headache.   Lancets (ONETOUCH ULTRASOFT) lancets Use as instructed five times daily.   losartan-hydrochlorothiazide (HYZAAR) 100-25 MG tablet TAKE 1 TABLET BY MOUTH DAILY   meloxicam (MOBIC) 7.5 MG tablet Take 7.5 mg by mouth daily as needed.   metFORMIN (GLUCOPHAGE-XR) 500 MG 24 hr tablet TAKE 1 TABLET BY MOUTH TWICE  DAILY   methocarbamol (ROBAXIN) 500 MG tablet Take 1 tablet (500 mg total) by mouth 2 (two) times daily.    metoprolol (TOPROL-XL) 200 MG 24 hr tablet TAKE 1 TABLET BY MOUTH EVERY DAY   Multiple Vitamins-Minerals (COMPLETE ENERGY) TABS Take 1 tablet by mouth daily.   nitroGLYCERIN (NITROSTAT) 0.4 MG SL tablet Place 1 tablet (0.4 mg total) under the tongue every 5 (five) minutes as needed for chest pain.   Omega-3 Fatty Acids (FISH OIL) 1000 MG CAPS Take 1 capsule by mouth daily.   omeprazole (PRILOSEC) 20 MG capsule TAKE 1 CAPSULE BY MOUTH DAILY   traMADol (ULTRAM) 50 MG tablet Take 50 mg by mouth 2 (two) times daily as needed for pain. (Patient not taking: Reported on 02/28/2022)   triamcinolone ointment (KENALOG) 0.1 % Apply 1 application topically 2 (two) times daily.   Turmeric (QC TUMERIC COMPLEX PO) Take 1,000 mg by mouth daily.   Facility-Administered Encounter Medications as of 05/07/2022  Medication   triamcinolone acetonide (KENALOG-40) injection 60 mg    Recent Relevant Labs: Lab Results  Component Value Date/Time   HGBA1C 7.4 (H) 02/28/2022 09:50 AM   HGBA1C 7.2 (H) 11/19/2021 10:04 AM   MICROALBUR 10 12/25/2020 10:17 AM    Kidney Function Lab Results  Component Value Date/Time   CREATININE 0.84 02/28/2022 09:50 AM   CREATININE 0.82 11/19/2021 10:04 AM   GFRNONAA >60 09/14/2008 03:58 PM   GFRAA  09/14/2008 03:58 PM    >60  The eGFR has been calculated using the MDRD equation. This calculation has not been validated in all clinical situations. eGFR's persistently <60 mL/min signify possible Chronic Kidney Disease.     Current antihyperglycemic regimen:  Metformin XR 552m twice daily  Patient verbally confirms she is taking the above medications as directed. Yes  What recent interventions/DTPs have been made to improve glycemic control:  Pt eats late at night and seems to effect her sugars in the morning   Have there been any recent hospitalizations or ED visits since last visit with CPP? No  Patient denies hypoglycemic symptoms, including None  Patient  denies hyperglycemic symptoms, including fatigue  How often are you checking your blood sugar? in the morning before eating or drinking and at bedtime  What are your blood sugars ranging?  Fasting: 05/07/22 143 05/06/22, 127 05/05/22, 05/04/22 149 05/03/22 145, 05/02/22 189 Before meals: 05/05/22 127, 05/04/22 157 After meals: 05/04/22 181 Bedtime: 05/06/22 137, 05/05/22 180, 05/04/22 149. 05/03/22 135, 05/02/22 197  On insulin? No  During the week, how often does your blood glucose drop below 70? Never  Are you checking your feet daily/regularly? Yes  Adherence Review: Is the patient currently on a STATIN medication? No Is the patient currently on ACE/ARB medication? Yes Does the patient have >5 day gap between last estimated fill dates? CPP to review  Care Gaps: Last eye exam / Retinopathy Screening? 01/21/22 Last Annual Wellness Visit? 05/14/2021 Last Diabetic Foot Exam? 11/19/21   Star Rating Drugs:  Medication:  Last Fill: Day Supply Metformin   03/26/22-01/15/22 100ds   DElray Mcgregor CPort ByronPharmacist Assistant  [phone]

## 2022-05-13 ENCOUNTER — Other Ambulatory Visit: Payer: Self-pay

## 2022-05-13 MED ORDER — METOPROLOL SUCCINATE ER 200 MG PO TB24: 200.0000 mg | ORAL_TABLET | Freq: Every day | ORAL | 0 refills | Status: DC

## 2022-05-20 DIAGNOSIS — M25511 Pain in right shoulder: Secondary | ICD-10-CM | POA: Diagnosis not present

## 2022-05-20 DIAGNOSIS — M75101 Unspecified rotator cuff tear or rupture of right shoulder, not specified as traumatic: Secondary | ICD-10-CM | POA: Diagnosis not present

## 2022-05-21 DIAGNOSIS — M1711 Unilateral primary osteoarthritis, right knee: Secondary | ICD-10-CM | POA: Diagnosis not present

## 2022-05-23 NOTE — Progress Notes (Signed)
Subjective:    Patient ID: Mindy Ingram, female    DOB: 28-Feb-1946, 76 y.o.   MRN: [id number]  HPI F never smoker followed for obstructive sleep apnea, allergic rhinitis complicated by DM, bronchitis, GERD. Office Spirometry 04/03/2015- WNL FVC 2.14/110%, FEV1 1.94/128%, FEV1/FVC 0.91 . NPSG 11/12/10- AHI  17.9/ hr, desat to 76% titrated to CPAP 12, body weight 165 lbs Office spirometry- 11/04/16--by allergy office- FEV1/FVC 0.77 WNL -------------------------------------------------------------------------------.    04/30/21- 76 year old female never smoker followed for OSA, complicated by allergic rhinitis, asthmatic bronchitis, DM2, GERD,  CPAP auto 5-17/ ([facility])   Machine replaced 11/2019 Download compliance -97%, AHI 2.2/ hr -Proventil hfa, flonase,  Body weight today-145 lbs Covid vax- Flu vax- -----Wearing CPAP-wakes up at night with off sometimes Download reviewed. Doing well with CPAP. Sleeps ok. Never uses rescue inhaler- just Flonase.  05/27/22-  76 year old female never smoker followed for OSA, complicated by allergic rhinitis, asthmatic bronchitis, DM2, GERD,  CPAP auto 5-17/ ([facility])   Machine replaced 11/2019 Download compliance 100%, AHI 2.8/ hr -Proventil hfa, flonase,  Body weight today-150 lbs Covid vax-4 Moderna Flu vax-had Download reviewed.  She would like reevaluation to see if she still needs CPAP-discussed. Breathing is comfortable and she feels well.  She has not had any apparent asthma or need for inhaler in a very long time. Pending shoulder surgery and requests clearance.  No obvious reason identified to prevent or delay this procedure.  She is given clearance to proceed.   Review of Systems- see HPI + = positive Constitutional:   No-   weight loss, night sweats, fevers, chills, fatigue, lassitude. HEENT:   No-  headaches, difficulty swallowing, tooth/dental problems, sore throat,       No-  sneezing,  itching, ear ache, +nasal congestion,+post nasal drip,  CV:  No-   chest pain, orthopnea, PND, swelling in lower extremities, anasarca, dizziness, palpitations Resp: No-   shortness of breath with exertion or at rest.              No-   productive cough,  No non-productive cough,  No-  coughing up of blood.              No-   change in color of mucus.  No- wheezing.   Skin:  GI:  No-   heartburn, indigestion, abdominal pain, nausea, vomiting,  GU:  MS:  +  joint pain or swelling.   Neuro- nothing unusual  Psych:  No- change in mood or affect. No depression or anxiety.  No memory loss.    Objective:   Physical Exam    Mask Covid precaution General- Alert, Oriented, Affect-appropriate, Distress- none acute, + overweight Skin- +Raised nodules right nasolabial fold without erythema or excoriation Lymphadenopathy- none Head- atraumatic            Eyes- Gross vision intact, PERRLA, conjunctivae clear secretions            Ears-hearing normal            Nose- +turbinate edema, No-Septal dev, mucus, No-polyps, erosion, perforation             Throat- Mallampati II , mucosa clear , drainage- none, tonsils- atrophic Neck- flexible , trachea midline, no stridor , thyroid nl, carotid no bruit Chest - symmetrical excursion , unlabored           Heart/CV- RRR , no murmur , no gallop  , no rub, nl s1 s2                           -  JVD- none , edema- none, stasis changes- none, varices- none           Lung- clear to P&A, wheeze-none, unlabored, cough-none, dullness-none, rub- none           Chest wall-  Abd- Br/ Gen/ Rectal- Not done, not indicated Extrem- cyanosis- none, clubbing, none, atrophy- none, strength- nl. Cane. Neuro- grossly intact to observation Assessment & Plan:

## 2022-05-26 ENCOUNTER — Ambulatory Visit (INDEPENDENT_AMBULATORY_CARE_PROVIDER_SITE_OTHER): Payer: Medicare Other

## 2022-05-26 DIAGNOSIS — Z0000 Encounter for general adult medical examination without abnormal findings: Secondary | ICD-10-CM | POA: Diagnosis not present

## 2022-05-26 NOTE — Progress Notes (Signed)
Subjective:   Mindy Ingram is a 76 y.o. female who presents for Medicare Annual (Subsequent) preventive examination.  I connected with  Mindy Ingram on 05/26/22 by a audio enabled telemedicine application and verified that I am speaking with the correct person using two identifiers.  Patient Location: Home  Provider Location: Office/Clinic  I discussed the limitations of evaluation and management by telemedicine. The patient expressed understanding and agreed to proceed.   Review of Systems     Cardiac Risk Factors include: advanced age (>74mn, >>70women);diabetes mellitus     Objective:    There were no vitals filed for this visit. There is no height or weight on file to calculate BMI.     05/14/2021    3:42 PM 07/21/2013    9:28 AM 07/12/2013   11:54 AM  Advanced Directives  Does Patient Have a Medical Advance Directive? No Patient does not have advance directive Patient does not have advance directive  Would patient like information on creating a medical advance directive? No - Patient declined    Pre-existing out of facility DNR order (yellow form or pink MOST form)  No     Current Medications (verified) Outpatient Encounter Medications as of 05/26/2022  Medication Sig   acyclovir (ZOVIRAX) 400 MG tablet Take 1 tablet (400 mg total) by mouth 2 (two) times daily as needed (outbreak).   albuterol (VENTOLIN HFA) 108 (90 Base) MCG/ACT inhaler Inhale 2 puffs into the lungs every 6 (six) hours as needed for wheezing or shortness of breath.   amLODipine (NORVASC) 5 MG tablet TAKE ONE-HALF TABLET BY MOUTH  DAILY   aspirin EC 81 MG tablet Take 81 mg by mouth daily.   azelastine (ASTELIN) 0.1 % nasal spray Place 1 spray into both nostrils 2 (two) times daily as needed for allergies or rhinitis.   Blood Glucose Monitoring Suppl (ONE TOUCH ULTRA 2) w/Device KIT Use as instructed five times daily. E11.69   cetirizine (ZYRTEC) 10 MG tablet Take 10 mg by mouth daily as  needed for allergies.   Cholecalciferol (VITAMIN D PO) Take 2,000 Units by mouth daily.    Elderberry 575 MG/5ML SYRP Take 5 mLs by mouth daily.   ezetimibe (ZETIA) 10 MG tablet TAKE 1 TABLET BY MOUTH DAILY   fluticasone (FLONASE) 50 MCG/ACT nasal spray USE 1 SPRAY IN EACH NOSTRIL EVERY DAY   glucose blood (ONETOUCH ULTRA) test strip Use as instructed five times daily.   ibuprofen (ADVIL) 800 MG tablet Take 800 mg by mouth every 8 (eight) hours as needed for headache.   Lancets (ONETOUCH ULTRASOFT) lancets Use as instructed five times daily.   losartan-hydrochlorothiazide (HYZAAR) 100-25 MG tablet TAKE 1 TABLET BY MOUTH DAILY   meloxicam (MOBIC) 7.5 MG tablet Take 7.5 mg by mouth daily as needed.   metFORMIN (GLUCOPHAGE-XR) 500 MG 24 hr tablet TAKE 1 TABLET BY MOUTH TWICE  DAILY   methocarbamol (ROBAXIN) 500 MG tablet Take 1 tablet (500 mg total) by mouth 2 (two) times daily.   metoprolol (TOPROL-XL) 200 MG 24 hr tablet Take 1 tablet (200 mg total) by mouth daily.   Multiple Vitamins-Minerals (COMPLETE ENERGY) TABS Take 1 tablet by mouth daily.   nitroGLYCERIN (NITROSTAT) 0.4 MG SL tablet Place 1 tablet (0.4 mg total) under the tongue every 5 (five) minutes as needed for chest pain.   Omega-3 Fatty Acids (FISH OIL) 1000 MG CAPS Take 1 capsule by mouth daily.   omeprazole (PRILOSEC) 20 MG capsule TAKE 1 CAPSULE  BY MOUTH DAILY   traMADol (ULTRAM) 50 MG tablet Take 50 mg by mouth 2 (two) times daily as needed for pain.   triamcinolone ointment (KENALOG) 0.1 % Apply 1 application topically 2 (two) times daily.   Turmeric (QC TUMERIC COMPLEX PO) Take 1,000 mg by mouth daily.   Facility-Administered Encounter Medications as of 05/26/2022  Medication   triamcinolone acetonide (KENALOG-40) injection 60 mg    Allergies (verified) Dairy aid [tilactase], Lactose, Atorvastatin, Hydrocodone-acetaminophen, Hydrocodone-acetaminophen, Milk-related compounds, Mirabegron, Oxycontin [oxycodone hcl],  Prochlorperazine edisylate, Rosuvastatin, Shellfish allergy, Umeclidinium-vilanterol, and Percodan [oxycodone-aspirin]   History: Past Medical History:  Diagnosis Date   Acquired hammer toe of right foot 08/11/2017   Allergic asthma, mild intermittent, uncomplicated 9/74/1638   Office Spirometry 04/03/2015- WNL FVC 2.14/110%, FEV1 1.94/128%, FEV1/FVC 0.91  Office spirometry- 11/04/16--by allergy office- FEV1/FVC 0.77 WNL   Alopecia 06/25/2020   Asthma    Asthmatic bronchitis, mild intermittent, with acute exacerbation 10/11/2007   Qualifier: Diagnosis of  By: Annamaria Boots MD, [place] D    Bilateral impacted cerumen 07/01/2018   Body mass index (BMI) 30.0-30.9, adult 07/10/2021   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   Chronic back pain    Contusion of head, initial encounter 03/18/2021   [place] of toe 06/25/2020   Costochondritis 06/25/2020   Cramp in lower leg associated with rest 06/25/2020   Degeneration of lumbosacral intervertebral disc 01/26/2012   DJD (degenerative joint disease)    DM type 2 with diabetic mixed hyperlipidemia ([place]) 04/08/2021   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   Hyperlipidemia 06/25/2020   Lateral epicondylitis 06/25/2020   Light headedness 03/18/2021   Low back pain 01/26/2012   Menopause 06/25/2020   Multiple benign melanocytic nevi 06/25/2020   Neck muscle spasm 04/26/2021   Obesity 07/10/2021   OSA (obstructive sleep apnea)    cpap setting of 10   Other long term (current) drug therapy 06/25/2020   Seasonal and perennial allergic rhinitis 10/17/2007   Allergy vaccine 2002- dc'd   Shoulder joint pain 06/25/2020   Sleep apnea    Type II or unspecified type diabetes mellitus without mention of complication, not stated as  uncontrolled    Upper airway cough syndrome 09/24/2015   Vitamin D deficiency 06/25/2020   Past Surgical History:  Procedure Laterality Date   [place]  2013   lower back with plates and screws   benign lump right axilla     BILATERAL SALPINGOOPHORECTOMY  [place]   at [place], slight limitation with turning neck   CHOLECYSTECTOMY  1995   EUS N/A 07/21/2013   Procedure: UPPER ENDOSCOPIC ULTRASOUND (EUS) LINEAR;  Surgeon: Milus Banister, MD;  Location: [facility];  Service: Endoscopy;  Laterality: N/A;   FOOT SURGERY     2007 - right great toe, 2000 - right fifth digit .   NEUROPLASTY / TRANSPOSITION MEDIAN NERVE AT [facility]   PARTIAL COLECTOMY  1995   benign adhesions   PARTIAL HYSTERECTOMY  1980s.   abdominal   right foot surgery  2000   right great toe with artificial bone inserted   right knee arthroscopy Right 02/2017   Family History  Problem Relation Age of Onset   Heart disease Mother    Prostate cancer Father    Heart attack Sister    Breast cancer Maternal Aunt    Prostate cancer Paternal Uncle    Allergies Neg Hx    Asthma Neg Hx    Eczema Neg Hx    Immunodeficiency Neg Hx    Social History   Socioeconomic History   Marital status: Widowed    Spouse name: Not on file   Number of children: 1   Years of education: Not on file   Highest education level: Not on file  Occupational History   Occupation: Retired  Tobacco Use   Smoking status: Never   Smokeless tobacco: Never  Vaping Use   Vaping Use: Never used  Substance and Sexual Activity   Alcohol use: No   Drug use: No   Sexual activity: Not on file  Other Topics Concern   Not on file  Social History Narrative   Not on file   Social Determinants of Health   Financial Resource Strain: Low Risk  (05/26/2022)   Overall Financial Resource Strain (CARDIA)    Difficulty of Paying Living Expenses: Not hard at all  Food  Insecurity: No Food Insecurity (05/26/2022)   Hunger Vital Sign    Worried About Running Out of Food in the Last Year: Never true    [place] in the Last Year: Never true  Transportation Needs: No Transportation Needs (05/26/2022)   PRAPARE - Hydrologist (Medical): No    Lack of Transportation (Non-Medical): No  Physical Activity: Insufficiently Active (05/26/2022)   Exercise Vital Sign    Days of Exercise per Week: 2 days    Minutes of Exercise per Session: 40 min  Stress: No Stress Concern Present (05/26/2022)   [place]    Feeling of Stress : Not at all  Social Connections: Moderately Isolated (05/26/2022)   Social Connection and Isolation Panel [NHANES]    Frequency of Communication with Friends and Family: More than three times a week    Frequency of Social Gatherings with Friends and Family: More than three times a week    Attends Religious Services: More than 4 times per year    Active Member of [employer] or Organizations: No    Attends Archivist Meetings: Never    Marital Status: Widowed    Tobacco Counseling Counseling given: Not Answered   Clinical Intake:     Pain : No/denies pain     BMI - recorded: 30.28 Nutritional Status: BMI > 30  Obese Diabetes: Yes  How often do you need to have someone help you when you read instructions, pamphlets, or other written materials from your doctor or pharmacy?: 1 - Never  Diabetic?YES         Activities of Daily Living    05/26/2022    1:05 PM  In your present state of health, do you have any difficulty performing the following activities:  Hearing? 0  Vision? 0  Difficulty concentrating or making decisions? 0  Walking or climbing stairs? 0  Dressing or bathing? 0  Doing errands, shopping? 0  Preparing Food and eating ? N  Using the Toilet? N  In the past six months, have you accidently leaked  urine? N  Do you have problems with loss of bowel control? N  Managing your Medications? N  Managing your Finances? N  Housekeeping or managing your Housekeeping? N    Patient Care Team: [name], MD as PCP - General (Family Medicine) Nehemiah Settle, MD (Gastroenterology) Hennie Duos, MD as Consulting Physician (Rheumatology) Sharyn Dross., DPM (Podiatry) Lane Hacker, [facility] (Pharmacist) Darleen Crocker, MD as Consulting Physician (Ophthalmology) Valente David, OD (Optometry)  Indicate any recent Medical Services you may have received from other than [facility] providers in the past year (date may be approximate).     Assessment:   This is a routine wellness examination for [facility].  Hearing/Vision screen No results found.  Dietary issues and exercise activities discussed: Current Exercise Habits: Home exercise routine, Time (Minutes): 45, Frequency (Times/Week): 2, Weekly Exercise (Minutes/Week): 90, Intensity: Mild   Goals Addressed   None   Depression Screen    05/26/2022    1:02 PM 02/28/2022    9:20 AM 08/06/2021   10:12 AM 05/14/2021    3:43 PM 02/26/2021   10:16 AM 12/25/2020   10:01 AM 11/21/2020    9:51 AM  PHQ 2/9 Scores  PHQ - 2 Score 0 0 0 0 0 0 0    Fall Risk    05/26/2022    1:05 PM 02/28/2022    9:20 AM 11/19/2021    1:11 PM 11/19/2021    1:07 PM 08/06/2021   10:12 AM  [place] in the past year? 1 1 0 0 0  Number falls in past yr: 0 0 0 0 0  Injury with Fall? 1 1 0 0 0  Risk for fall due to : No Fall Risks  No Fall Risks No Fall Risks   Follow up Falls evaluation completed  Falls evaluation completed Falls evaluation completed Falls evaluation completed    [place]:  Any stairs in or around the home? No  If so, are there any without handrails?  N/A Home free of loose throw rugs in walkways, pet beds, electrical cords, etc? Yes  Adequate lighting in your home to reduce risk of falls? Yes    ASSISTIVE DEVICES UTILIZED TO PREVENT FALLS:  Life alert? No  Use of a cane, walker or w/c?  Use a cane if she have too, Has a walker in case she needs it around the house. Grab bars in the bathroom? Yes  Shower chair or bench in shower? Yes  Elevated toilet seat or a handicapped toilet? No    Cognitive Function:        05/26/2022    1:07 PM 05/14/2021    3:46 PM  6CIT Screen  What Year? 0 points 0 points  What month? 0 points 0 points  What time? 0 points 0 points  Count back from 20 0 points 0 points  Months in reverse 0 points 0 points  Repeat phrase 0 points 0 points  Total Score 0 points 0 points    Immunizations Immunization History  Administered Date(s) Administered   Fluad Quad(high Dose 65+) 04/08/2021, 04/16/2022   Hepatitis B, adult 07/01/2003   Influenza Split 03/12/2011, 03/30/2012, 04/01/2013, 04/04/2014, 04/03/2015, 04/02/2016, 03/30/2017, 04/14/2020   Influenza Whole 04/30/2009, 03/06/2011   Influenza, High Dose Seasonal PF 03/30/2017, 03/11/2018, 03/11/2018, 03/16/2019   Influenza,inj,Quad PF,6+ Mos 03/30/2013, 03/30/2014, 04/03/2015, 04/02/2016   Moderna Covid-19 Vaccine Bivalent Booster 23yr & up 04/08/2021   Moderna Sars-Covid-2 Vaccination 08/05/2019, 08/31/2019, 04/25/2020   Pneumococcal Conjugate-13 02/16/2014   Pneumococcal Polysaccharide-23 06/30/1996, 10/28/2016   Td 07/01/2003   Tdap 01/16/2012   Zoster, Live 07/27/2013,  05/09/2020, 08/12/2020    TDAP status: Up to date  Flu Vaccine status: Up to date  Pneumococcal vaccine status: Up to date  Covid-19 vaccine status: Completed vaccines  Qualifies for Shingles Vaccine? YES  Zostavax completed No   Shingrix Completed?: No.    Education has been provided regarding the importance of this vaccine. Patient has been advised to call insurance company to determine out of pocket expense if they have not yet received this vaccine. Advised may also receive vaccine at local pharmacy or Health  Dept. Verbalized acceptance and understanding.  Screening Tests Health Maintenance  Topic Date Due   Hepatitis C Screening  Never done   Zoster Vaccines- Shingrix (1 of 2) Never done   COVID-19 Vaccine (5 - 2023-24 season) 02/28/2022   Medicare Annual Wellness (AWV)  05/14/2022   COLONOSCOPY (Pts 45-49yr Insurance coverage will need to be confirmed)  06/12/2022   MAMMOGRAM  06/25/2022   Diabetic kidney evaluation - Urine ACR  08/06/2022   HEMOGLOBIN A1C  08/29/2022   FOOT EXAM  11/20/2022   OPHTHALMOLOGY EXAM  01/22/2023   Diabetic kidney evaluation - GFR measurement  03/01/2023   Pneumonia Vaccine 76 Years old  Completed   INFLUENZA VACCINE  Completed   DEXA SCAN  Completed   HPV VACCINES  Aged Out    Health Maintenance  Health Maintenance Due  Topic Date Due   Hepatitis C Screening  Never done   Zoster Vaccines- Shingrix (1 of 2) Never done   COVID-19 Vaccine (5 - 2023-24 season) 02/28/2022   Medicare Annual Wellness (AWV)  05/14/2022    Colorectal cancer screening: Type of screening: Colonoscopy. Completed 06/16/2017. Repeat every 5 years  Mammogram status: Ordered 06/26/2022. Pt provided with contact info and advised to call to schedule appt.   Bone Density status: Completed 10/28/2016. Results reflect: Bone density results: NORMAL. Repeat every 0 years.  Lung Cancer Screening: (Low Dose CT Chest recommended if Age 76-80years, 30 pack-year currently smoking OR have quit w/in 15years.) does not qualify.   Lung Cancer Screening Referral: N/A  Additional Screening:  Hepatitis C Screening: does not qualify; Completed N/A  Vision Screening: Recommended annual ophthalmology exams for early detection of glaucoma and other disorders of the eye. Is the patient up to date with their annual eye exam?  Yes  Who is the provider or what is the name of the office in which the patient attends annual eye exams? Dr. If pt is not established with a provider, would they like to be  referred to a provider to establish care? No .   Dental Screening: Recommended annual dental exams for proper oral hygiene  Community Resource Referral / Chronic Care Management: CRR required this visit?   N/A  CCM required this visit?   Already have CCM     Plan:     I have personally reviewed and noted the following in the patient's chart:   Medical and social history Use of alcohol, tobacco or illicit drugs  Current medications and supplements including opioid prescriptions. Patient is currently taking opioid prescriptions. Information provided to patient regarding non-opioid alternatives. Patient advised to discuss non-opioid treatment plan with their provider. Functional ability and status Nutritional status Physical activity Advanced directives List of other physicians Hospitalizations, surgeries, and ER visits in previous 12 months Vitals Screenings to include cognitive, depression, and falls Referrals and appointments  In addition, I have reviewed and discussed with patient certain preventive protocols, quality metrics, and best practice recommendations. A written  personalized care plan for preventive services as well as general preventive health recommendations were provided to patient.     Burna Forts, [place]   05/26/2022    Ms. [place] , Thank you for taking time to come for your Medicare Wellness Visit. I appreciate your ongoing commitment to your health goals. Please review the following plan we discussed and let me know if I can assist you in the future.   These are the goals we discussed:  Goals      Manage My Medicine     Timeframe:  Long-Range Goal Priority:  High Start Date:                             Expected End Date:                       Follow Up Date 06/2021   - call for medicine refill 2 or 3 days before it runs out - keep a list of all the medicines I take; vitamins and herbals too - use a pillbox to sort medicine    Why is this  important?   These steps will help you keep on track with your medicines.   Notes:      Monitor and Manage My Blood Sugar-Diabetes Type 2     Timeframe:  Long-Range Goal Priority:  High Start Date:                             Expected End Date:                       Follow Up Date 06/2021   - check blood sugar at prescribed times - check blood sugar if I feel it is too high or too low - take the blood sugar log to all doctor visits    Why is this important?   Checking your blood sugar at home helps to keep it from getting very high or very low.  Writing the results in a diary or log helps the doctor know how to care for you.  Your blood sugar log should have the time, date and the results.  Also, write down the amount of insulin or other medicine that you take.  Other information, like what you ate, exercise done and how you were feeling, will also be helpful.     Notes:      Track and Manage My Blood Pressure-Hypertension     Timeframe:  Long-Range Goal Priority:  High Start Date:                             Expected End Date:                       Follow Up Date 06/2021   - check blood pressure daily - write blood pressure results in a log or diary    Why is this important?   You won't feel high blood pressure, but it can still hurt your blood vessels.  High blood pressure can cause heart or kidney problems. It can also cause a stroke.  Making lifestyle changes like losing a little weight or eating less salt will help.  Checking your blood pressure at home and at different times of the day can help  to control blood pressure.  If the doctor prescribes medicine remember to take it the way the doctor ordered.  Call the office if you cannot afford the medicine or if there are questions about it.     Notes:         This is a list of the screening recommended for you and due dates:  Health Maintenance  Topic Date Due   Hepatitis C Screening: USPSTF Recommendation to  screen - Ages 28-79 yo.  Never done   Zoster (Shingles) Vaccine (1 of 2) Never done   COVID-19 Vaccine (5 - 2023-24 season) 02/28/2022   Medicare Annual Wellness Visit  05/14/2022   Colon Cancer Screening  06/12/2022   Mammogram  06/25/2022   Yearly kidney health urinalysis for diabetes  08/06/2022   Hemoglobin A1C  08/29/2022   Complete foot exam   11/20/2022   Eye exam for diabetics  01/22/2023   Yearly kidney function blood test for diabetes  03/01/2023   Pneumonia Vaccine  Completed   Flu Shot  Completed   DEXA scan (bone density measurement)  Completed   HPV Vaccine  Aged Out

## 2022-05-27 ENCOUNTER — Encounter: Payer: Self-pay | Admitting: Internal Medicine

## 2022-05-27 ENCOUNTER — Ambulatory Visit (INDEPENDENT_AMBULATORY_CARE_PROVIDER_SITE_OTHER): Payer: Medicare Other | Admitting: Internal Medicine

## 2022-05-27 VITALS — BP 116/62 | HR 95 | Ht 59.5 in | Wt 150.2 lb

## 2022-05-27 DIAGNOSIS — G4733 Obstructive sleep apnea (adult) (pediatric): Secondary | ICD-10-CM | POA: Diagnosis not present

## 2022-05-27 DIAGNOSIS — J4520 Mild intermittent asthma, uncomplicated: Secondary | ICD-10-CM | POA: Diagnosis not present

## 2022-05-27 NOTE — Patient Instructions (Addendum)
Order- schedule home sleep test  Please call us about 2 weeks after your sleep test for results and recommendations  You are clear from a pulmonary standpoint for shoulder surgery

## 2022-05-29 ENCOUNTER — Telehealth: Payer: Self-pay | Admitting: Internal Medicine

## 2022-05-30 NOTE — Telephone Encounter (Signed)
ATC LVMTCB x 1  

## 2022-05-30 NOTE — Telephone Encounter (Signed)
Pt called the office back. States that she does have concerns about her cpap machine when she goes for her surgery.   States that usually after surgery, she has to stay overnight at the hospital. Pt said that she has a lot of allergies and does not want to have anything else to be prescribed or a different anesthesia to be used. Stated to pt to discuss this with surgeon and she verbalize understanding. Nothing further needed.

## 2022-06-06 ENCOUNTER — Telehealth: Payer: Self-pay

## 2022-06-06 ENCOUNTER — Encounter: Payer: Self-pay | Admitting: Internal Medicine

## 2022-06-06 NOTE — Assessment & Plan Note (Signed)
This has been inactive with no complication in many months.

## 2022-06-06 NOTE — Assessment & Plan Note (Addendum)
It is appropriate now to update sleep study.  Meanwhile she can continue CPAP auto 5-19, which has benefited her. Plan- update sleep study

## 2022-06-06 NOTE — Progress Notes (Signed)
Chronic Care Management Pharmacy Assistant   Name: KESHAUNA DEGRAFFENREID  MRN: [id number] DOB: 15-Nov-1945   Reason for Encounter: Disease State call for HTN   Recent office visits:  05/26/22 Rhae Hammock LPN. Seen for Medicare Annual Wellness. No med changes.  Recent consult visits:  05/27/22 (Pulmonary) Baird Lyons MD. Seen for OSA. No med changes.  Hospital visits:  None  Medications: Outpatient Encounter Medications as of 06/06/2022  Medication Sig   acyclovir (ZOVIRAX) 400 MG tablet Take 1 tablet (400 mg total) by mouth 2 (two) times daily as needed (outbreak).   albuterol (VENTOLIN HFA) 108 (90 Base) MCG/ACT inhaler Inhale 2 puffs into the lungs every 6 (six) hours as needed for wheezing or shortness of breath.   amLODipine (NORVASC) 5 MG tablet TAKE ONE-HALF TABLET BY MOUTH  DAILY   aspirin EC 81 MG tablet Take 81 mg by mouth daily.   azelastine (ASTELIN) 0.1 % nasal spray Place 1 spray into both nostrils 2 (two) times daily as needed for allergies or rhinitis.   Blood Glucose Monitoring Suppl (ONE TOUCH ULTRA 2) w/Device KIT Use as instructed five times daily. E11.69   cetirizine (ZYRTEC) 10 MG tablet Take 10 mg by mouth daily as needed for allergies.   Cholecalciferol (VITAMIN D PO) Take 2,000 Units by mouth daily.    Elderberry 575 MG/5ML SYRP Take 5 mLs by mouth daily.   ezetimibe (ZETIA) 10 MG tablet TAKE 1 TABLET BY MOUTH DAILY   fluticasone (FLONASE) 50 MCG/ACT nasal spray USE 1 SPRAY IN EACH NOSTRIL EVERY DAY   glucose blood (ONETOUCH ULTRA) test strip Use as instructed five times daily.   ibuprofen (ADVIL) 800 MG tablet Take 800 mg by mouth every 8 (eight) hours as needed for headache.   Lancets (ONETOUCH ULTRASOFT) lancets Use as instructed five times daily.   losartan-hydrochlorothiazide (HYZAAR) 100-25 MG tablet TAKE 1 TABLET BY MOUTH DAILY   meloxicam (MOBIC) 7.5 MG tablet Take 7.5 mg by mouth daily as needed.   metFORMIN (GLUCOPHAGE-XR) 500 MG 24 hr  tablet TAKE 1 TABLET BY MOUTH TWICE  DAILY   methocarbamol (ROBAXIN) 500 MG tablet Take 1 tablet (500 mg total) by mouth 2 (two) times daily.   metoprolol (TOPROL-XL) 200 MG 24 hr tablet Take 1 tablet (200 mg total) by mouth daily.   Multiple Vitamins-Minerals (COMPLETE ENERGY) TABS Take 1 tablet by mouth daily.   nitroGLYCERIN (NITROSTAT) 0.4 MG SL tablet Place 1 tablet (0.4 mg total) under the tongue every 5 (five) minutes as needed for chest pain.   Omega-3 Fatty Acids (FISH OIL) 1000 MG CAPS Take 1 capsule by mouth daily.   omeprazole (PRILOSEC) 20 MG capsule TAKE 1 CAPSULE BY MOUTH DAILY   traMADol (ULTRAM) 50 MG tablet Take 50 mg by mouth 2 (two) times daily as needed for pain.   triamcinolone ointment (KENALOG) 0.1 % Apply 1 application topically 2 (two) times daily.   Turmeric (QC TUMERIC COMPLEX PO) Take 1,000 mg by mouth daily. Every other day   Facility-Administered Encounter Medications as of 06/06/2022  Medication   triamcinolone acetonide (KENALOG-40) injection 60 mg     Recent Office Vitals: BP Readings from Last 3 Encounters:  05/27/22 116/62  02/28/22 116/72  12/05/21 122/68   Pulse Readings from Last 3 Encounters:  05/27/22 95  02/28/22 72  12/05/21 63    Wt Readings from Last 3 Encounters:  05/27/22 150 lb 3.2 oz (68.1 kg)  02/28/22 150 lb (68 kg)  12/05/21 151  lb (68.5 kg)     Kidney Function Lab Results  Component Value Date/Time   CREATININE 0.84 02/28/2022 09:50 AM   CREATININE 0.82 11/19/2021 10:04 AM   GFRNONAA >60 09/14/2008 03:58 PM   GFRAA  09/14/2008 03:58 PM    >60        The eGFR has been calculated using the MDRD equation. This calculation has not been validated in all clinical situations. eGFR's persistently <60 mL/min signify possible Chronic Kidney Disease.       Latest Ref Rng & Units 02/28/2022    9:50 AM 11/19/2021   10:04 AM 08/06/2021   10:51 AM  BMP  Glucose 70 - 99 mg/dL 122  124  124   BUN 8 - 27 mg/dL _0 Creatinine 0.57 - 1.00 mg/dL 0.84  0.82  0.97   BUN/Creat Ratio 12 - _1 Sodium 134 - 144 mmol/L 142  140  140   Potassium 3.5 - 5.2 mmol/L 3.9  4.1  4.0   Chloride 96 - 106 mmol/L 99  102  98   CO2 20 - 29 mmol/L _2 Calcium 8.7 - 10.3 mg/dL 9.6  9.4  10.0      Current antihypertensive regimen:  Losartan-HCTZ 100-4m daily  Metoprolol 2088mdaily  Amlodipine 66m67m/2 tab daily  Patient verbally confirms she is taking the above medications as directed. Yes  Do you receive your medications through PAP? No  If Yes, what is the status? Last received?  How often are you checking your Blood Pressure? twice daily  she checks her blood pressure in the morning and at bedtime before taking her medication.  Current home BP readings:   06/06/22 143/77 63 am   06/05/22 132/69 68 am, 141/76 85 pm  06/04/22 131/67 66 am, 153/89 73 pm  06/03/22 136/77 86 am, 142/76 84 pm  06/02/22 150/80 64 am, 148/76  68 pm  Question: Pt wants to know what time of day she needs to take her BP medicine according to her readings. Please advise.    Wrist or arm cuff: Arm  Caffeine intake:None Salt intake:Limited OTC medications including pseudoephedrine or NSAIDs? Ibuprofen, and taking Meloxicam and states her BP are always higher after taking   Any readings above 180/120? No  What recent interventions/DTPs have been made by any provider to improve Blood Pressure control since last CPP Visit: No changes  Any recent hospitalizations or ED visits since last visit with CPP? No  What diet changes have been made to improve Blood Pressure Control?  No changes  What exercise is being done to improve your Blood Pressure Control?  Pt tries to get up and go to her exercise class   Adherence Review: Is the patient currently on ACE/ARB medication? Yes Does the patient have >5 day gap between last estimated fill dates? CPP to review  Care Gaps: Last annual wellness visit? 05/26/22  Star  Rating Drugs:  Medication:  Last Fill: Day Supply Losartan-HCTZ 06/01/22-03/05/22 100ds   DanElray McgregorMASt. Cloudarmacist Assistant  [phone]

## 2022-06-10 ENCOUNTER — Encounter: Payer: Self-pay | Admitting: Family Medicine

## 2022-06-10 ENCOUNTER — Ambulatory Visit (INDEPENDENT_AMBULATORY_CARE_PROVIDER_SITE_OTHER): Payer: Medicare Other | Admitting: Family Medicine

## 2022-06-10 ENCOUNTER — Other Ambulatory Visit: Payer: Self-pay | Admitting: Family Medicine

## 2022-06-10 VITALS — BP 118/62 | HR 73 | Temp 97.3°F | Resp 16 | Ht 59.0 in | Wt 148.5 lb

## 2022-06-10 DIAGNOSIS — G720 Drug-induced myopathy: Secondary | ICD-10-CM | POA: Diagnosis not present

## 2022-06-10 DIAGNOSIS — Z6829 Body mass index (BMI) 29.0-29.9, adult: Secondary | ICD-10-CM

## 2022-06-10 DIAGNOSIS — E1159 Type 2 diabetes mellitus with other circulatory complications: Secondary | ICD-10-CM

## 2022-06-10 DIAGNOSIS — I152 Hypertension secondary to endocrine disorders: Secondary | ICD-10-CM

## 2022-06-10 DIAGNOSIS — G4733 Obstructive sleep apnea (adult) (pediatric): Secondary | ICD-10-CM

## 2022-06-10 DIAGNOSIS — S46011D Strain of muscle(s) and tendon(s) of the rotator cuff of right shoulder, subsequent encounter: Secondary | ICD-10-CM

## 2022-06-10 DIAGNOSIS — H6123 Impacted cerumen, bilateral: Secondary | ICD-10-CM | POA: Diagnosis not present

## 2022-06-10 DIAGNOSIS — E1169 Type 2 diabetes mellitus with other specified complication: Secondary | ICD-10-CM | POA: Diagnosis not present

## 2022-06-10 DIAGNOSIS — K219 Gastro-esophageal reflux disease without esophagitis: Secondary | ICD-10-CM | POA: Diagnosis not present

## 2022-06-10 DIAGNOSIS — E782 Mixed hyperlipidemia: Secondary | ICD-10-CM | POA: Diagnosis not present

## 2022-06-10 DIAGNOSIS — T466X5A Adverse effect of antihyperlipidemic and antiarteriosclerotic drugs, initial encounter: Secondary | ICD-10-CM | POA: Diagnosis not present

## 2022-06-10 DIAGNOSIS — Z01810 Encounter for preprocedural cardiovascular examination: Secondary | ICD-10-CM

## 2022-06-10 HISTORY — DX: Body mass index (BMI) 29.0-29.9, adult: Z68.29

## 2022-06-10 HISTORY — DX: Encounter for preprocedural cardiovascular examination: Z01.810

## 2022-06-10 HISTORY — DX: Drug-induced myopathy: G72.0

## 2022-06-10 LAB — POCT URINALYSIS DIP (CLINITEK)
Bilirubin, UA: NEGATIVE
Blood, UA: NEGATIVE
Glucose, UA: NEGATIVE mg/dL
Ketones, POC UA: NEGATIVE mg/dL
Leukocytes, UA: NEGATIVE
Nitrite, UA: NEGATIVE
POC PROTEIN,UA: NEGATIVE
Spec Grav, UA: 1.015 (ref 1.010–1.025)
Urobilinogen, UA: 0.2 E.U./dL
pH, UA: 5.5 (ref 5.0–8.0)

## 2022-06-10 MED ORDER — TIRZEPATIDE 2.5 MG/0.5ML ~~LOC~~ SOAJ: 2.5000 mg | SUBCUTANEOUS | 0 refills | Status: DC

## 2022-06-10 MED ORDER — CETIRIZINE HCL 10 MG PO TABS: 10.0000 mg | ORAL_TABLET | Freq: Every day | ORAL | 3 refills | Status: DC | PRN

## 2022-06-10 NOTE — Assessment & Plan Note (Signed)
Using cpap.  Follow up on home sleep study that Dr. Annamaria Boots ordered.

## 2022-06-10 NOTE — Assessment & Plan Note (Signed)
The current medical regimen is effective;  continue present plan and medications.  Omeprazole 20 mg daily. 

## 2022-06-10 NOTE — Progress Notes (Deleted)
Subjective:  Patient ID: Mindy Ingram, female    DOB: 1946/06/03  Age: 76 y.o. MRN: [id number]  CHIEF COMPLAINT:     HPI: 42 years  Diabetes:  Complications: mixed hyperlipidemia Glucose checking:  Glucose logs:80-130 prior to ESIs. Since then 150s-320. Improving now 150-200.   Hypoglycemia: no  Most recent A1C: 7.2% Current medications: Metformin 500 mg Twice a day. Last Eye Exam: 01/21/2022 Foot checks: daily  Hyperlipidemia: Current medications: Zetia 10 mg daily, Fish Oil 1000 mg daily. Intolerant to statins.  GERD: Taking Omeprazole 20 mg daily.  Hypertension: Current medications: Amlodipine 5 mg daily, Aspirin 81 mg daily, Metoprolol 200 mg every day, Losartan-Hctz 100-25 mg daily.  Diet: not eating a healthy diet.  Exercise:  in therapy for back and torn rotator cuff. Patient sees Dr Samule Dry for her right shoulder. Exercising also.    Current Outpatient Medications on File Prior to Visit  Medication Sig Dispense Refill   acyclovir (ZOVIRAX) 400 MG tablet Take 1 tablet (400 mg total) by mouth 2 (two) times daily as needed (outbreak). 100 tablet 3   albuterol (VENTOLIN HFA) 108 (90 Base) MCG/ACT inhaler Inhale 2 puffs into the lungs every 6 (six) hours as needed for wheezing or shortness of breath.     amLODipine (NORVASC) 5 MG tablet TAKE ONE-HALF TABLET BY MOUTH  DAILY 50 tablet 2   aspirin EC 81 MG tablet Take 81 mg by mouth daily.     azelastine (ASTELIN) 0.1 % nasal spray Place 1 spray into both nostrils 2 (two) times daily as needed for allergies or rhinitis.     Blood Glucose Monitoring Suppl (ONE TOUCH ULTRA 2) w/Device KIT Use as instructed five times daily. E11.69 1 kit 0   cetirizine (ZYRTEC) 10 MG tablet Take 10 mg by mouth daily as needed for allergies.     Cholecalciferol (VITAMIN D PO) Take 2,000 Units by mouth daily.      Elderberry 575 MG/5ML SYRP Take 5 mLs by mouth daily.     ezetimibe (ZETIA) 10 MG tablet TAKE 1 TABLET BY MOUTH DAILY 100 tablet  2   fluticasone (FLONASE) 50 MCG/ACT nasal spray USE 1 SPRAY IN EACH NOSTRIL EVERY DAY 48 g 1   glucose blood (ONETOUCH ULTRA) test strip Use as instructed five times daily. 100 each 12   ibuprofen (ADVIL) 800 MG tablet Take 800 mg by mouth every 8 (eight) hours as needed for headache.     Lancets (ONETOUCH ULTRASOFT) lancets Use as instructed five times daily. 100 each 12   losartan-hydrochlorothiazide (HYZAAR) 100-25 MG tablet TAKE 1 TABLET BY MOUTH DAILY 90 tablet 3   meloxicam (MOBIC) 7.5 MG tablet Take 7.5 mg by mouth daily as needed.     metFORMIN (GLUCOPHAGE-XR) 500 MG 24 hr tablet TAKE 1 TABLET BY MOUTH TWICE  DAILY 200 tablet 2   methocarbamol (ROBAXIN) 500 MG tablet Take 1 tablet (500 mg total) by mouth 2 (two) times daily. 180 tablet 0   metoprolol (TOPROL-XL) 200 MG 24 hr tablet Take 1 tablet (200 mg total) by mouth daily. 90 tablet 0   Multiple Vitamins-Minerals (COMPLETE ENERGY) TABS Take 1 tablet by mouth daily.     nitroGLYCERIN (NITROSTAT) 0.4 MG SL tablet Place 1 tablet (0.4 mg total) under the tongue every 5 (five) minutes as needed for chest pain. 30 tablet 4   Omega-3 Fatty Acids (FISH OIL) 1000 MG CAPS Take 1 capsule by mouth daily.     omeprazole (PRILOSEC) 20 MG capsule  TAKE 1 CAPSULE BY MOUTH DAILY 100 capsule 2   traMADol (ULTRAM) 50 MG tablet Take 50 mg by mouth 2 (two) times daily as needed for pain.     triamcinolone ointment (KENALOG) 0.1 % Apply 1 application topically 2 (two) times daily. 80 g 0   Turmeric (QC TUMERIC COMPLEX PO) Take 1,000 mg by mouth daily. Every other day     Current Facility-Administered Medications on File Prior to Visit  Medication Dose Route Frequency Provider Last Rate Last Admin   triamcinolone acetonide (KENALOG-40) injection 60 mg  60 mg Intramuscular Once [name], PA-C       Past Medical History:  Diagnosis Date   Acquired hammer toe of right foot 08/11/2017   Allergic asthma, mild intermittent, uncomplicated 7/86/7544   Office  Spirometry 04/03/2015- WNL FVC 2.14/110%, FEV1 1.94/128%, FEV1/FVC 0.91  Office spirometry- 11/04/16--by allergy office- FEV1/FVC 0.77 WNL   Alopecia 06/25/2020   Asthma    Asthmatic bronchitis, mild intermittent, with acute exacerbation 10/11/2007   Qualifier: Diagnosis of  By: Annamaria Boots MD, [place] D    Bilateral impacted cerumen 07/01/2018   Body mass index (BMI) 30.0-30.9, adult 07/10/2021   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   Chronic back pain    Contusion of head, initial encounter 03/18/2021   [place] of toe 06/25/2020   Costochondritis 06/25/2020   Cramp in lower leg associated with rest 06/25/2020   Degeneration of lumbosacral intervertebral disc 01/26/2012   DJD (degenerative joint disease)    DM type 2 with diabetic mixed hyperlipidemia ([place]) 04/08/2021   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   Hyperlipidemia 06/25/2020   Lateral epicondylitis 06/25/2020   Light headedness 03/18/2021   Low back pain 01/26/2012   Menopause 06/25/2020   Multiple benign melanocytic nevi 06/25/2020   Neck muscle spasm 04/26/2021   Obesity 07/10/2021   OSA (obstructive sleep apnea)    cpap setting of 10   Other long term (current) drug therapy 06/25/2020   Seasonal and perennial allergic rhinitis 10/17/2007   Allergy vaccine 2002- dc'd   Shoulder joint pain 06/25/2020   Sleep apnea    Type II or unspecified type diabetes mellitus without mention of complication, not stated as uncontrolled    Upper airway cough syndrome 09/24/2015   Vitamin D deficiency 06/25/2020   Past Surgical History:  Procedure Laterality Date   [place]  2013   lower back with plates and screws   benign lump right axilla     [place]   at [facility], slight limitation with turning neck   CHOLECYSTECTOMY  1995   EUS N/A 07/21/2013   Procedure: UPPER ENDOSCOPIC ULTRASOUND (EUS) LINEAR;  Surgeon: Milus Banister, MD;  Location: [facility];  Service: Endoscopy;  Laterality: N/A;   FOOT SURGERY     2007 - right great toe, 2000 - right fifth digit .   NEUROPLASTY / TRANSPOSITION MEDIAN NERVE AT [place]   PARTIAL COLECTOMY  1995   benign adhesions   PARTIAL HYSTERECTOMY  1980s.   abdominal   right foot surgery  2000   right great toe with artificial bone inserted  right knee arthroscopy Right 02/2017    Family History  Problem Relation Age of Onset   Heart disease Mother    Prostate cancer Father    Heart attack Sister    Breast cancer Maternal Aunt    Prostate cancer Paternal Uncle    Allergies Neg Hx    Asthma Neg Hx    Eczema Neg Hx    Immunodeficiency Neg Hx    Social History   Socioeconomic History   Marital status: Widowed    Spouse name: Not on file   Number of children: 1   Years of education: Not on file   Highest education level: Not on file  Occupational History   Occupation: Retired  Tobacco Use   Smoking status: Never   Smokeless tobacco: Never  Vaping Use   Vaping Use: Never used  Substance and Sexual Activity   Alcohol use: No   Drug use: No   Sexual activity: Not on file  Other Topics Concern   Not on file  Social History Narrative   Not on file   Social Determinants of Health   Financial Resource Strain: Low Risk  (05/26/2022)   Overall Financial Resource Strain (CARDIA)    Difficulty of Paying Living Expenses: Not hard at all  Food Insecurity: No Food Insecurity (05/26/2022)   Hunger Vital Sign    Worried About Running Out of Food in the Last Year: Never true    [place] in the Last Year: Never true  Transportation Needs: No Transportation Needs (05/26/2022)   PRAPARE - Hydrologist  (Medical): No    Lack of Transportation (Non-Medical): No  Physical Activity: Insufficiently Active (05/26/2022)   Exercise Vital Sign    Days of Exercise per Week: 2 days    Minutes of Exercise per Session: 40 min  Stress: No Stress Concern Present (05/26/2022)   [place]    Feeling of Stress : Not at all  Social Connections: Moderately Isolated (05/26/2022)   Social Connection and Isolation Panel [NHANES]    Frequency of Communication with Friends and Family: More than three times a week    Frequency of Social Gatherings with Friends and Family: More than three times a week    Attends Religious Services: More than 4 times per year    Active Member of [employer] or Organizations: No    Attends Archivist Meetings: Never    Marital Status: Widowed    Review of Systems   Objective:  There were no vitals taken for this visit.     05/27/2022    1:24 PM 02/28/2022    9:12 AM 12/05/2021    2:51 PM  BP/Weight  Systolic BP 606 301 601  Diastolic BP 62 72 68  Wt. (Lbs) 150.2 150 151  BMI 29.83 kg/m2 30.3 kg/m2 32.68 kg/m2    Physical Exam  Diabetic Foot Exam - Simple   No data filed      Lab Results  Component Value Date   WBC 5.1 02/28/2022   HGB 12.8 02/28/2022   HCT 39.1 02/28/2022   PLT 298 02/28/2022   GLUCOSE 122 (H) 02/28/2022   CHOL 172 02/28/2022   TRIG 158 (H) 02/28/2022   HDL 48 02/28/2022   LDLCALC 96 02/28/2022   ALT 17 02/28/2022   AST 21 02/28/2022   NA 142 02/28/2022   K 3.9 02/28/2022   CL 99 02/28/2022   CREATININE  0.84 02/28/2022   BUN 17 02/28/2022   CO2 26 02/28/2022   TSH 1.370 11/19/2021   HGBA1C 7.4 (H) 02/28/2022   MICROALBUR 10 12/25/2020      Assessment & Plan:   Problem List Items Addressed This Visit   None .  No orders of the defined types were placed in this encounter.   No orders of the defined types were placed in this encounter.    Follow-up:  No follow-ups on file.  An After Visit Summary was printed and given to the patient.  Neil Crouch, [place] [phone]

## 2022-06-10 NOTE — Assessment & Plan Note (Addendum)
EKG inverted T waves. Consistent with last EKG in 06/2021 and also in 2021.  Ordered cxr.  Ordered labs.  Ordered urine culture.  Ordered MRSA nasal culture.  Requested Dr. Robert Bellow records and preop clearance.  CLEARED FOR SURGERY

## 2022-06-10 NOTE — Progress Notes (Addendum)
Subjective:  Patient ID: Mindy Ingram, female    DOB: 06/04/1946  Age: 76 y.o. MRN: [id number]  CHIEF COMPLAINT:  DM Hyperlipidemia GERD Left shoulder pain  HPI: 76 years old patient with diabetes, hyperlipidemia, gerd, and hypertension, is here for chronic follow up. She said she is following with Dr Samule Dry for her right shoulder rotator cuff injury and she is going through repair in future. She says we should have received the paperwork for cv clearance.   Diabetes:  Not well controlled Glucose checking: regularly Glucose logs:Blood sugar runs between 150-200.   Hypoglycemia: no  Most recent A1C: 7.4% Current medications: Metformin XR 500 mg Twice a day. Last Eye Exam: 01/21/2022 Foot checks: daily  Hyperlipidemia: Current medications: Zetia 10 mg daily, Fish Oil 1000 mg daily. Intolerant to statins.  GERD: Taking Omeprazole 20 mg daily.  Hypertension: Current medications: Amlodipine 5 mg daily, Aspirin 81 mg daily, Metoprolol 200 mg every day, Losartan-Hctz 100-25 mg daily.  OSA: Sees Dr. Annamaria Boots. Home sleep study ordered on 05/27/2022. I do not see the results.  She has continued on CPAP.  Patient is concerned that she has Herpes listed in her chart. She reports recurrent episodes of zoster shingles, not herpes.  I explained that herpes zoster is in the same class of viruses. She was prescribed acyclovir 400 mg one twice daily as needed outbreak.   Rotator cuff tear. MRI abnormal. Cspine mri was also abnormal. She saw Dr. Harl Bowie last summer. He recommended physical therapy for neck and shoulder. If this did not help, Dr. Harl Bowie considered ESI.   Current Outpatient Medications on File Prior to Visit  Medication Sig Dispense Refill   acyclovir (ZOVIRAX) 400 MG tablet Take 1 tablet (400 mg total) by mouth 2 (two) times daily as needed (outbreak). 100 tablet 3   albuterol (VENTOLIN HFA) 108 (90 Base) MCG/ACT inhaler Inhale 2 puffs into the lungs every 6 (six) hours as needed  for wheezing or shortness of breath.     aspirin EC 81 MG tablet Take 81 mg by mouth daily.     azelastine (ASTELIN) 0.1 % nasal spray Place 1 spray into both nostrils 2 (two) times daily as needed for allergies or rhinitis.     Blood Glucose Monitoring Suppl (ONE TOUCH ULTRA 2) w/Device KIT Use as instructed five times daily. E11.69 1 kit 0   Cholecalciferol (VITAMIN D PO) Take 2,000 Units by mouth daily.      Elderberry 575 MG/5ML SYRP Take 5 mLs by mouth daily.     ezetimibe (ZETIA) 10 MG tablet TAKE 1 TABLET BY MOUTH DAILY 100 tablet 2   fluticasone (FLONASE) 50 MCG/ACT nasal spray USE 1 SPRAY IN EACH NOSTRIL EVERY DAY 48 g 1   ibuprofen (ADVIL) 800 MG tablet Take 800 mg by mouth every 8 (eight) hours as needed for headache.     losartan-hydrochlorothiazide (HYZAAR) 100-25 MG tablet TAKE 1 TABLET BY MOUTH DAILY 90 tablet 3   meloxicam (MOBIC) 7.5 MG tablet Take 7.5 mg by mouth daily as needed.     methocarbamol (ROBAXIN) 500 MG tablet Take 1 tablet (500 mg total) by mouth 2 (two) times daily. 180 tablet 0   metoprolol (TOPROL-XL) 200 MG 24 hr tablet Take 1 tablet (200 mg total) by mouth daily. 90 tablet 0   Multiple Vitamins-Minerals (COMPLETE ENERGY) TABS Take 1 tablet by mouth daily.     nitroGLYCERIN (NITROSTAT) 0.4 MG SL tablet Place 1 tablet (0.4 mg total) under the tongue every  5 (five) minutes as needed for chest pain. 30 tablet 4   Omega-3 Fatty Acids (FISH OIL) 1000 MG CAPS Take 1 capsule by mouth daily.     omeprazole (PRILOSEC) 20 MG capsule TAKE 1 CAPSULE BY MOUTH DAILY 100 capsule 2   traMADol (ULTRAM) 50 MG tablet Take 50 mg by mouth 2 (two) times daily as needed for pain.     triamcinolone ointment (KENALOG) 0.1 % Apply 1 application topically 2 (two) times daily. 80 g 0   Turmeric (QC TUMERIC COMPLEX PO) Take 1,000 mg by mouth daily. Every other day     amLODipine (NORVASC) 5 MG tablet Take 5 mg by mouth daily.     Current Facility-Administered Medications on File Prior to  Visit  Medication Dose Route Frequency Provider Last Rate Last Admin   triamcinolone acetonide (KENALOG-40) injection 60 mg  60 mg Intramuscular Once [name], PA-C       Past Medical History:  Diagnosis Date   Acquired hammer toe of right foot 08/11/2017   Allergic asthma, mild intermittent, uncomplicated 44/96/7591   Office Spirometry 04/03/2015- WNL FVC 2.14/110%, FEV1 1.94/128%, FEV1/FVC 0.91  Office spirometry- 11/04/16--by allergy office- FEV1/FVC 0.77 WNL   Alopecia 06/25/2020   Asthma    Asthmatic bronchitis, mild intermittent, with acute exacerbation 10/11/2007   Qualifier: Diagnosis of  By: Annamaria Boots MD, [place] D    Bilateral impacted cerumen 07/01/2018   Body mass index (BMI) 30.0-30.9, adult 07/10/2021   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   Hyperlipidemia 06/25/2020   Lateral epicondylitis 06/25/2020   Light headedness 03/18/2021   Low back pain 01/26/2012   Menopause 06/25/2020   Multiple benign melanocytic nevi 06/25/2020   Neck muscle spasm 04/26/2021   Obesity 07/10/2021   OSA (obstructive sleep apnea)    cpap setting of 10   Other long term (current) drug therapy 06/25/2020   Seasonal and perennial allergic rhinitis 10/17/2007   Allergy vaccine 2002- dc'd   Shingles    Shoulder  joint pain 06/25/2020   Sleep apnea    Type II or unspecified type diabetes mellitus without mention of complication, not stated as uncontrolled    Upper airway cough syndrome 09/24/2015   Vitamin D deficiency 06/25/2020   Past Surgical History:  Procedure Laterality Date   [place]  2013   lower back with plates and screws   benign lump right axilla     [place]   at [facility], slight limitation with turning neck   CHOLECYSTECTOMY  1995   EUS N/A 07/21/2013   Procedure: UPPER ENDOSCOPIC ULTRASOUND (EUS) LINEAR;  Surgeon: Milus Banister, MD;  Location: [facility];  Service: Endoscopy;  Laterality: N/A;  FOOT SURGERY     2007 - right great toe, 2000 - right fifth digit .   NEUROPLASTY / TRANSPOSITION MEDIAN NERVE AT [facility]   PARTIAL COLECTOMY  1995   benign adhesions   PARTIAL HYSTERECTOMY  1980s.   abdominal   right foot surgery  2000   right great toe with artificial bone inserted   right knee arthroscopy Right 02/2017    Family History  Problem Relation Age of Onset   Heart disease Mother    Prostate cancer Father    Heart attack Sister    Breast cancer Maternal Aunt    Prostate cancer Paternal Uncle    Allergies Neg Hx    Asthma Neg Hx    Eczema Neg Hx    Immunodeficiency Neg Hx    Social History   Socioeconomic History   Marital status: Widowed    Spouse name: Not on file   Number of children: 1   Years of education: Not on file   Highest education level: Not on file  Occupational History   Occupation: Retired  Tobacco Use   Smoking status: Never   Smokeless tobacco: Never  Vaping Use   Vaping Use: Never used  Substance and Sexual Activity   Alcohol use: No   Drug use: No   Sexual activity: Not on file  Other Topics Concern   Not on file  Social History Narrative   Not on file   Social Determinants of Health   Financial Resource Strain: Low  Risk  (05/26/2022)   Overall Financial Resource Strain (CARDIA)    Difficulty of Paying Living Expenses: Not hard at all  Food Insecurity: No Food Insecurity (05/26/2022)   Hunger Vital Sign    Worried About Running Out of Food in the Last Year: Never true    [place] in the Last Year: Never true  Transportation Needs: No Transportation Needs (05/26/2022)   PRAPARE - Hydrologist (Medical): No    Lack of Transportation (Non-Medical): No  Physical Activity: Insufficiently Active (05/26/2022)   Exercise Vital Sign    Days of Exercise per Week: 2 days    Minutes of Exercise per Session: 40 min  Stress: No Stress Concern Present (05/26/2022)   [place]    Feeling of Stress : Not at all  Social Connections: Moderately Isolated (05/26/2022)   Social Connection and Isolation Panel [NHANES]    Frequency of Communication with Friends and Family: More than three times a week    Frequency of Social Gatherings with Friends and Family: More than three times a week    Attends Religious Services: More than 4 times per year    Active Member of [employer] or Organizations: No    Attends Archivist Meetings: Never    Marital Status: Widowed    Review of Systems  Constitutional:  Negative for chills and fatigue.  HENT:  Negative for congestion, ear pain, sinus pain and sore throat.        Ears full of wax.   Respiratory:  Negative for cough and shortness of breath.   Cardiovascular:  Negative for chest pain and leg swelling.  Gastrointestinal:  Negative for abdominal pain, constipation, diarrhea, nausea and vomiting.  Genitourinary:  Negative for dysuria and frequency.  Musculoskeletal:  Negative for arthralgias, back pain and myalgias.  Neurological:  Negative for dizziness and headaches.     Objective:  BP 118/62   Pulse 73   Temp (!) 97.3 F (36.3 C)   Resp 16   Ht _0  (1.499  m)   Wt 148 lb 8 oz (67.4 kg)   SpO2 99%   BMI 29.99 kg/m      06/10/2022    9:23 AM 05/27/2022    1:24 PM 02/28/2022    9:12 AM  BP/Weight  Systolic BP 277 412 878  Diastolic BP 62 62 72  Wt. (Lbs) 148.5 150.2 150  BMI 29.99 kg/m2 29.83 kg/m2 30.3 kg/m2   Diabetic Foot Exam - Simple   Simple Foot Form Diabetic Foot exam was performed with the following findings: Yes 06/10/2022  1:09 PM  Visual Inspection No deformities, no ulcerations, no other skin breakdown bilaterally: Yes Sensation Testing Intact to touch and monofilament testing bilaterally: Yes Pulse Check Posterior Tibialis and Dorsalis pulse intact bilaterally: Yes Comments     Physical Exam Vitals reviewed.  Constitutional:      Appearance: Normal appearance. She is normal weight.  HENT:     Right Ear: There is impacted cerumen.     Left Ear: There is impacted cerumen.  Neck:     Vascular: No carotid bruit.  Cardiovascular:     Rate and Rhythm: Normal rate and regular rhythm.     Heart sounds: Normal heart sounds.  Pulmonary:     Effort: Pulmonary effort is normal.     Breath sounds: Normal breath sounds.  Abdominal:     General: Abdomen is flat. Bowel sounds are normal.     Palpations: Abdomen is soft.     Tenderness: There is no abdominal tenderness.  Musculoskeletal:        General: Tenderness and deformity (right shoulder rotator cuff injury) present.  Neurological:     Mental Status: She is alert and oriented to person, place, and time.  Psychiatric:        Mood and Affect: Mood normal.        Behavior: Behavior normal.    Lab Results  Component Value Date   WBC 5.2 06/10/2022   HGB 13.0 06/10/2022   HCT 38.8 06/10/2022   PLT 323 06/10/2022   GLUCOSE 109 (H) 06/10/2022   CHOL 180 06/10/2022   TRIG 84 06/10/2022   HDL 60 06/10/2022   LDLCALC 105 (H) 06/10/2022   ALT 15 06/10/2022   AST 16 06/10/2022   NA 140 06/10/2022   K 3.7 06/10/2022   CL 98 06/10/2022   CREATININE 0.88  06/10/2022   BUN 17 06/10/2022   CO2 27 06/10/2022   TSH 1.370 11/19/2021   INR 0.9 06/10/2022   HGBA1C 7.6 (H) 06/10/2022   MICROALBUR 10 12/25/2020      Assessment & Plan:   Problem List Items Addressed This Visit       Cardiovascular and Mediastinum   Hypertension associated with diabetes ([place])    Well controlled.  No changes to medicines.  Medications: Amlodipine 5 mg daily, Aspirin 81 mg daily, Metoprolol 200 mg every day, Losartan-Hctz 100-25 mg daily Continue to work on eating a healthy diet and exercise.  Labs drawn today.       Relevant Medications   tirzepatide (MOUNJARO) 2.5 MG/0.5ML Pen   Other Relevant Orders   CBC with Differential/Platelet (Completed)   Comprehensive metabolic panel (Completed)     Respiratory   OSA (obstructive sleep apnea)    Using cpap.  Follow up on home sleep study that Dr. Annamaria Boots ordered.  Digestive   GERD    The current medical regimen is effective; continue present plan and medications. Omeprazole 20 mg daily.        Endocrine   DM type 2 with diabetic mixed hyperlipidemia ([facility])    Control:  not well controlled Recommend check sugars fasting daily and running between 150-200 Recommend check feet daily. Recommend annual eye exams. Medicines: Metformin 500 mg Twice a day. Added Mounjaro 2.5 every week, samples given for a month and first dose administered at [employer] Continue to work on eating a healthy diet and exercise.  Labs drawn today.        Relevant Medications   tirzepatide (MOUNJARO) 2.5 MG/0.5ML Pen   Other Relevant Orders   Hemoglobin A1c (Completed)     Nervous and Auditory   Bilateral impacted cerumen    Bilateral ear Lavage done today. Unsuccessful.  Recommended use debrox x 1 week, then may return for nurse visit for irrigation.        Musculoskeletal and Integument   Right rotator cuff tear    Preop work out done for rotator cuff surgery in future EKG, MRSA culture, Chest X-ray       Statin myopathy    Intolerant to statins.         Other   Hyperlipidemia    Well controlled.  No changes to medicines. Zetia 10 mg daily, Fish Oil 1000 mg daily. Continue to work on eating a healthy diet and exercise.  Labs drawn today.       Relevant Orders   Lipid Panel (Completed)   Preop cardiovascular exam - Primary    EKG inverted T waves. Consistent with last EKG in 06/2021 and also in 2021.  Ordered cxr.  Ordered labs.  Ordered urine culture.  Ordered MRSA nasal culture.  Requested Dr. Robert Bellow records and preop clearance.  CLEARED FOR SURGERY      Relevant Orders   POCT URINALYSIS DIP (CLINITEK) (Completed)   Urine Culture   Protime-INR (Completed)   EKG 12-Lead (Completed)   DG Chest 2 View   MRSA culture (Completed)   BMI 29.0-29.9,adult  .  Meds ordered this encounter  Medications   cetirizine (ZYRTEC) 10 MG tablet    Sig: Take 1 tablet (10 mg total) by mouth daily as needed for allergies.    Dispense:  90 tablet    Refill:  3    Order Specific Question:   Supervising Provider    Answer:   Shelton Silvas   tirzepatide (MOUNJARO) 2.5 MG/0.5ML Pen    Sig: Inject 2.5 mg into the skin once a week.    Dispense:  2 mL    Refill:  0    Orders Placed This Encounter  Procedures   Urine Culture   MRSA culture   DG Chest 2 View   Lipid Panel   Hemoglobin A1c   CBC with Differential/Platelet   Comprehensive metabolic panel   Protime-INR   Cardiovascular Risk Assessment   POCT URINALYSIS DIP (CLINITEK)   EKG 12-Lead    Total time spent on today's visit was greater than 60 minutes, including both face-to-face time and nonface-to-face time personally spent on review of chart (labs and imaging), discussing labs and goals, discussing further work-up, treatment options, referrals to specialist if needed, reviewing outside records of pertinent, answering patient's questions, and coordinating care.  Follow-up:  in one month. I would recommend we see  patient monthly due to her numerous medical issues. I will recommend this when  we cal her with her lab results.   An After Visit Summary was printed and given to the patient.  [name], MD [facility] [phone]

## 2022-06-10 NOTE — Assessment & Plan Note (Signed)
Well controlled.  No changes to medicines. Zetia 10 mg daily, Fish Oil 1000 mg daily. Continue to work on eating a healthy diet and exercise.  Labs drawn today.

## 2022-06-10 NOTE — Assessment & Plan Note (Addendum)
Well controlled.  No changes to medicines.  Medications: Amlodipine 5 mg daily, Aspirin 81 mg daily, Metoprolol 200 mg every day, Losartan-Hctz 100-25 mg daily Continue to work on eating a healthy diet and exercise.  Labs drawn today.

## 2022-06-10 NOTE — Patient Instructions (Signed)
Follow up in 1 months to discuss future plan of care  Tirzepatide Injection What is this medication? TIRZEPATIDE (tir ZEP a tide) treats type 2 diabetes. It works by increasing insulin levels in your body, which decreases your blood sugar (glucose). Changes to diet and exercise are often combined with this medication. This medicine may be used for other purposes; ask your health care provider or pharmacist if you have questions. COMMON BRAND NAME(S): MOUNJARO What should I tell my care team before I take this medication? They need to know if you have any of these conditions: Endocrine tumors (MEN 2) or if someone in your family had these tumors Eye disease, vision problems Gallbladder disease History of pancreatitis Kidney disease Stomach or intestine problems Thyroid cancer or if someone in your family had thyroid cancer An unusual or allergic reaction to tirzepatide, other medications, foods, dyes, or preservatives Pregnant or trying to get pregnant Breast-feeding How should I use this medication? This medication is injected under the skin. You will be taught how to prepare and give it. It is given once every week (every 7 days). Keep taking it unless your health care provider tells you to stop. If you use this medication with insulin, you should inject this medication and the insulin separately. Do not mix them together. Do not give the injections right next to each other. Change (rotate) injection sites with each injection. This medication comes with INSTRUCTIONS FOR USE. Ask your pharmacist for directions on how to use this medication. Read the information carefully. Talk to your pharmacist or care team if you have questions. It is important that you put your used needles and syringes in a special sharps container. Do not put them in a trash can. If you do not have a sharps container, call your pharmacist or care team to get one. A special MedGuide will be given to you by the pharmacist  with each prescription and refill. Be sure to read this information carefully each time. Talk to your care team about the use of this medication in children. Special care may be needed. Overdosage: If you think you have taken too much of this medicine contact a poison control center or emergency room at once. NOTE: This medicine is only for you. Do not share this medicine with others. What if I miss a dose? If you miss a dose, take it as soon as you can unless it is more than 4 days (96 hours) late. If it is more than 4 days late, skip the missed dose. Take the next dose at the normal time. Do not take 2 doses within 3 days of each other. What may interact with this medication? Alcohol Antiviral medications for HIV or AIDS Aspirin and aspirin-like medications Beta blockers, such as atenolol, metoprolol, propranolol Certain medications for blood pressure, heart disease, irregular heart beat Chromium Clonidine Diuretics Estrogen or progestin hormones Fenofibrate Gemfibrozil Guanethidine Isoniazid Lanreotide Female hormones or anabolic steroids MAOIs, such as Marplan, Nardil, and Parnate Medications for weight loss Medications for allergies, asthma, cold, or cough Medications for depression, anxiety, or mental health conditions Niacin Nicotine NSAIDs, medications for pain and inflammation, such as ibuprofen or naproxen Octreotide Other medications for diabetes, such as glyburide, glipizide, or glimepiride Pasireotide Pentamidine Phenytoin Probenecid Quinolone antibiotics, such as ciprofloxacin, levofloxacin, ofloxacin Reserpine Some herbal dietary supplements Steroid medications, such as prednisone or cortisone Sulfamethoxazole; trimethoprim Thyroid hormones Warfarin This list may not describe all possible interactions. Give your health care provider a list of all the  medicines, herbs, non-prescription drugs, or dietary supplements you use. Also tell them if you smoke, drink  alcohol, or use illegal drugs. Some items may interact with your medicine. What should I watch for while using this medication? Visit your care team for regular checks on your progress. Drink plenty of fluids while taking this medication. Check with your care team if you get an attack of severe diarrhea, nausea, and vomiting. The loss of too much body fluid can make it dangerous for you to take this medication. A test called the HbA1C (A1C) will be monitored. This is a simple blood test. It measures your blood sugar control over the last 2 to 3 months. You will receive this test every 3 to 6 months. Learn how to check your blood sugar. Learn the symptoms of low and high blood sugar and how to manage them. Always carry a quick-source of sugar with you in case you have symptoms of low blood sugar. Examples include hard sugar candy or glucose tablets. Make sure others know that you can choke if you eat or drink when you develop serious symptoms of low blood sugar, such as seizures or unconsciousness. They must get medical help at once. Tell your care team if you have high blood sugar. You might need to change the dose of your medication. If you are sick or exercising more than usual, you might need to change the dose of your medication. Do not skip meals. Ask your care team if you should avoid alcohol. Many nonprescription cough and cold products contain sugar or alcohol. These can affect blood sugar. Pens should never be shared. Even if the needle is changed, sharing may result in passing of viruses like hepatitis or HIV. Wear a medical ID bracelet or chain, and carry a card that describes your disease and details of your medication and dosage times. Birth control may not work properly while you are taking this medication. If you take birth control pills by mouth, your care team may recommend another type of birth control for 4 weeks after you start this medication and for 4 weeks after each increase in  your dose of this medication. Ask your care team which birth control methods you should use. What side effects may I notice from receiving this medication? Side effects that you should report to your care team as soon as possible: Allergic reactions or angioedema--skin rash, itching or hives, swelling of the face, eyes, lips, tongue, arms, or legs, trouble swallowing or breathing Change in vision Dehydration--increased thirst, dry mouth, feeling faint or lightheaded, headache, dark yellow or brown urine Gallbladder problems--severe stomach pain, nausea, vomiting, fever Kidney injury--decrease in the amount of urine, swelling of the ankles, hands, or feet Pancreatitis--severe stomach pain that spreads to your back or gets worse after eating or when touched, fever, nausea, vomiting Thyroid cancer--new mass or lump in the neck, pain or trouble swallowing, trouble breathing, hoarseness Side effects that usually do not require medical attention (report these to your care team if they continue or are bothersome): Constipation Diarrhea Loss of appetite Nausea Stomach pain Upset stomach Vomiting This list may not describe all possible side effects. Call your doctor for medical advice about side effects. You may report side effects to FDA at 1-800-FDA-1088. Where should I keep my medication? Keep out of the reach of children and pets. Refrigeration (preferred): Store in the refrigerator. Keep this medication in the original carton until you are ready to take it. Do not freeze. Protect from light.  Get rid of opened vials after use, even if there is medication left. Get rid of any unopened vials or pens after the expiration date. Room Temperature: This medication may be stored at room temperature below 30 degrees C (86 degrees F) for up to 21 days. Keep this medication in the original carton until you are ready to take it. Protect from light. Avoid exposure to extreme heat. Get rid of opened vials after  use, even if there is medication left. Get rid of any unopened vials or pens after 21 days, or after they expire, whichever is first. To get rid of medications that are no longer needed or have expired: Take the medication to a medication take-back program. Check with your pharmacy or law enforcement to find a location. If you cannot return the medication, ask your pharmacist or care team how to get rid of this medication safely. NOTE: This sheet is a summary. It may not cover all possible information. If you have questions about this medicine, talk to your doctor, pharmacist, or health care provider.  2023 Elsevier/Gold Standard (2020-11-14 00:00:00)

## 2022-06-10 NOTE — Assessment & Plan Note (Addendum)
Bilateral ear Lavage done today. Unsuccessful.  Recommended use debrox x 1 week, then may return for nurse visit for irrigation.

## 2022-06-10 NOTE — Assessment & Plan Note (Signed)
Preop work out done for rotator cuff surgery in future EKG, MRSA culture, Chest X-ray

## 2022-06-10 NOTE — Assessment & Plan Note (Signed)
Intolerant to statins. 

## 2022-06-10 NOTE — Assessment & Plan Note (Addendum)
Control:  not well controlled Recommend check sugars fasting daily and running between 150-200 Recommend check feet daily. Recommend annual eye exams. Medicines: Metformin 500 mg Twice a day. Added Mounjaro 2.5 every week, samples given for a month and first dose administered at [employer] Continue to work on eating a healthy diet and exercise.  Labs drawn today.

## 2022-06-11 LAB — LIPID PANEL
Chol/HDL Ratio: 3 ratio (ref 0.0–4.4)
Cholesterol, Total: 180 mg/dL (ref 100–199)
HDL: 60 mg/dL (ref >39)
LDL Chol Calc (NIH): 105 mg/dL — ABNORMAL HIGH (ref 0–99)
Triglycerides: 84 mg/dL (ref 0–149)
VLDL Cholesterol Cal: 15 mg/dL (ref 5–40)

## 2022-06-11 LAB — COMPREHENSIVE METABOLIC PANEL
ALT: 15 IU/L (ref 0–32)
AST: 16 IU/L (ref 0–40)
Albumin/Globulin Ratio: 1.9 (ref 1.2–2.2)
Albumin: 4.3 g/dL (ref 3.8–4.8)
Alkaline Phosphatase: 50 IU/L (ref 44–121)
BUN/Creatinine Ratio: 19 (ref 12–28)
BUN: 17 mg/dL (ref 8–27)
Bilirubin Total: <0.2 mg/dL (ref 0.0–1.2)
CO2: 27 mmol/L (ref 20–29)
Calcium: 9.5 mg/dL (ref 8.7–10.3)
Chloride: 98 mmol/L (ref 96–106)
Creatinine, Ser: 0.88 mg/dL (ref 0.57–1.00)
Globulin, Total: 2.3 g/dL (ref 1.5–4.5)
Glucose: 109 mg/dL — ABNORMAL HIGH (ref 70–99)
Potassium: 3.7 mmol/L (ref 3.5–5.2)
Sodium: 140 mmol/L (ref 134–144)
Total Protein: 6.6 g/dL (ref 6.0–8.5)
eGFR: 68 mL/min/{1.73_m2} (ref >59)

## 2022-06-11 LAB — CBC WITH DIFFERENTIAL/PLATELET
Basophils Absolute: 0 10*3/uL (ref 0.0–0.2)
Basos: 1 %
EOS (ABSOLUTE): 0 10*3/uL (ref 0.0–0.4)
Eos: 1 %
Hematocrit: 38.8 % (ref 34.0–46.6)
Hemoglobin: 13 g/dL (ref 11.1–15.9)
Immature Grans (Abs): 0 10*3/uL (ref 0.0–0.1)
Immature Granulocytes: 0 %
Lymphocytes Absolute: 2 10*3/uL (ref 0.7–3.1)
Lymphs: 38 %
MCH: 30 pg (ref 26.6–33.0)
MCHC: 33.5 g/dL (ref 31.5–35.7)
MCV: 90 fL (ref 79–97)
Monocytes Absolute: 0.4 10*3/uL (ref 0.1–0.9)
Monocytes: 7 %
Neutrophils Absolute: 2.7 10*3/uL (ref 1.4–7.0)
Neutrophils: 53 %
Platelets: 323 10*3/uL (ref 150–450)
RBC: 4.33 x10E6/uL (ref 3.77–5.28)
RDW: 12.6 % (ref 11.7–15.4)
WBC: 5.2 10*3/uL (ref 3.4–10.8)

## 2022-06-11 LAB — HEMOGLOBIN A1C
Est. average glucose Bld gHb Est-mCnc: 171 mg/dL
Hgb A1c MFr Bld: 7.6 % — ABNORMAL HIGH (ref 4.8–5.6)

## 2022-06-11 LAB — PROTIME-INR
INR: 0.9 (ref 0.9–1.2)
Prothrombin Time: 10.3 s (ref 9.1–12.0)

## 2022-06-11 LAB — CARDIOVASCULAR RISK ASSESSMENT

## 2022-06-11 NOTE — Progress Notes (Signed)
CXR no pneumonia.  EKG: T inversion, but unchanged from previous EKG.  I will discuss with Dr. Geraldo Pitter and determine if he feels you need a stress test prior to surgery.  PT/INR normal

## 2022-06-12 LAB — URINE CULTURE

## 2022-06-12 LAB — MRSA CULTURE: MRSA Screen: NEGATIVE

## 2022-06-13 ENCOUNTER — Other Ambulatory Visit: Payer: Self-pay

## 2022-06-13 NOTE — Telephone Encounter (Signed)
Spoke with patient, BP is elevated. Will let new PCP know

## 2022-06-16 ENCOUNTER — Other Ambulatory Visit: Payer: Self-pay | Admitting: Nurse Practitioner

## 2022-06-16 DIAGNOSIS — E1169 Type 2 diabetes mellitus with other specified complication: Secondary | ICD-10-CM

## 2022-06-16 DIAGNOSIS — G4733 Obstructive sleep apnea (adult) (pediatric): Secondary | ICD-10-CM | POA: Diagnosis not present

## 2022-06-16 MED ORDER — TIRZEPATIDE 5 MG/0.5ML ~~LOC~~ SOAJ: 5.0000 mg | SUBCUTANEOUS | 0 refills | Status: DC

## 2022-06-16 NOTE — Addendum Note (Signed)
Addended by: Lane Hacker on: 06/16/2022 09:44 AM   Modules accepted: Orders

## 2022-06-16 NOTE — Telephone Encounter (Signed)
OK per Rehka via direct msg to take whole Amlodipine ('5mg'$ ) vs half a tablet (2.'5mg'$ ). Called and let patient know

## 2022-06-18 ENCOUNTER — Other Ambulatory Visit: Payer: Self-pay

## 2022-06-18 ENCOUNTER — Other Ambulatory Visit: Payer: Self-pay | Admitting: Nurse Practitioner

## 2022-06-18 DIAGNOSIS — Z01810 Encounter for preprocedural cardiovascular examination: Secondary | ICD-10-CM

## 2022-06-18 DIAGNOSIS — E1169 Type 2 diabetes mellitus with other specified complication: Secondary | ICD-10-CM

## 2022-06-18 MED ORDER — LANCETS 30G MISC: 3.0000 | 2 refills | Status: DC | PRN

## 2022-06-18 MED ORDER — BLOOD GLUCOSE TEST STRIPS 333 VI STRP: 100.0000 | ORAL_STRIP | 3 refills | Status: DC | PRN

## 2022-06-18 MED ORDER — METFORMIN HCL ER (MOD) 1000 MG PO TB24: ORAL_TABLET | ORAL | 3 refills | Status: DC

## 2022-06-19 ENCOUNTER — Telehealth: Payer: Self-pay

## 2022-06-19 ENCOUNTER — Telehealth: Payer: Self-pay | Admitting: Cardiology

## 2022-06-19 ENCOUNTER — Other Ambulatory Visit: Payer: Self-pay | Admitting: Family Medicine

## 2022-06-19 DIAGNOSIS — E1169 Type 2 diabetes mellitus with other specified complication: Secondary | ICD-10-CM

## 2022-06-19 MED ORDER — XIGDUO XR 5-1000 MG PO TB24: 1.0000 | ORAL_TABLET | Freq: Every day | ORAL | 0 refills | Status: DC

## 2022-06-19 MED ORDER — BLOOD GLUCOSE TEST STRIPS 333 VI STRP: 100.0000 | ORAL_STRIP | Freq: Three times a day (TID) | 3 refills | Status: DC

## 2022-06-19 MED ORDER — XIGDUO XR 5-1000 MG PO TB24: 1.0000 | ORAL_TABLET | Freq: Every day | ORAL | 3 refills | Status: DC

## 2022-06-19 NOTE — Telephone Encounter (Signed)
-----   Message from [name], MD sent at 06/19/2022  5:02 PM EST ----- Regarding: FW: dm meds Error. Merleen Nicely is covered by PAP.  See other note. Dr. Tobie Poet  ----- Message ----- From: Oleta Mouse, CMA Sent: 06/19/2022   5:00 PM EST To: [name], MD Subject: FW: dm meds                                     ----- Message ----- From: [name], MD Sent: 06/19/2022   4:59 PM EST To: [facility] Subject: FW: dm meds                                    Recommend glimepiride 1 mg once before largest meal.  Continue metformin 500 mg twice daily.  Other medications are still tier 3 and too expensive. Also do not have PAP.  Dr. Tobie Poet   ----- Message ----- From: Juliane Poot Sent: 06/19/2022   3:41 PM EST To: [name], MD Subject: RE: dm meds                                    Neither one, they are both Tier 3   ----- Message ----- From: [name], MD Sent: 06/19/2022   2:26 PM EST To: Juliane Poot; [facility] [employer] Subject: dm meds                                        Patient is here in the office.  Can you tell me if xigduo or synjardy has better coverage for this patient's insurance and if not, can we get PAP?  Thank you,  Dr. Tobie Poet

## 2022-06-19 NOTE — Telephone Encounter (Signed)
Patient states she has questions about the last EKG performed   Please call [phone]

## 2022-06-19 NOTE — Telephone Encounter (Signed)
Reccomendations reviewed with pt as per Dr. Julien Nordmann note.  Pt verbalized understanding and had no additional questions.

## 2022-06-19 NOTE — Telephone Encounter (Signed)
Pt states she saw Kristen [facility] yesterday for preop and she was concerned with her EKG from last year and this year. Please advise

## 2022-06-19 NOTE — Telephone Encounter (Signed)
Patient informed and patient agrees.

## 2022-06-20 ENCOUNTER — Telehealth: Payer: Self-pay

## 2022-06-20 NOTE — Progress Notes (Signed)
PAP for Xigduo 5/'1000mg'$  has been started and uploaded and will be mailed out to pt to complete    Elray Mcgregor, [place] Pharmacist Assistant  [phone]

## 2022-06-24 DIAGNOSIS — M75101 Unspecified rotator cuff tear or rupture of right shoulder, not specified as traumatic: Secondary | ICD-10-CM | POA: Diagnosis not present

## 2022-06-24 DIAGNOSIS — M25511 Pain in right shoulder: Secondary | ICD-10-CM | POA: Diagnosis not present

## 2022-06-26 ENCOUNTER — Ambulatory Visit
Admission: RE | Admit: 2022-06-26 | Discharge: 2022-06-26 | Disposition: A | Discharge status: Home / Self Care | Payer: Medicare Other | Source: Ambulatory Visit | Attending: Family Medicine | Admitting: Family Medicine

## 2022-06-26 DIAGNOSIS — Z1231 Encounter for screening mammogram for malignant neoplasm of breast: Secondary | ICD-10-CM | POA: Diagnosis not present

## 2022-06-27 ENCOUNTER — Telehealth: Payer: Self-pay

## 2022-06-27 DIAGNOSIS — S46011A Strain of muscle(s) and tendon(s) of the rotator cuff of right shoulder, initial encounter: Secondary | ICD-10-CM | POA: Diagnosis not present

## 2022-06-27 DIAGNOSIS — S46111A Strain of muscle, fascia and tendon of long head of biceps, right arm, initial encounter: Secondary | ICD-10-CM | POA: Diagnosis not present

## 2022-06-27 DIAGNOSIS — M25511 Pain in right shoulder: Secondary | ICD-10-CM | POA: Diagnosis not present

## 2022-06-27 DIAGNOSIS — M7551 Bursitis of right shoulder: Secondary | ICD-10-CM | POA: Diagnosis not present

## 2022-06-27 DIAGNOSIS — M19011 Primary osteoarthritis, right shoulder: Secondary | ICD-10-CM | POA: Diagnosis not present

## 2022-06-27 DIAGNOSIS — G8918 Other acute postprocedural pain: Secondary | ICD-10-CM | POA: Diagnosis not present

## 2022-06-27 DIAGNOSIS — M7521 Bicipital tendinitis, right shoulder: Secondary | ICD-10-CM | POA: Diagnosis not present

## 2022-06-27 DIAGNOSIS — M75121 Complete rotator cuff tear or rupture of right shoulder, not specified as traumatic: Secondary | ICD-10-CM | POA: Diagnosis not present

## 2022-06-27 DIAGNOSIS — M75101 Unspecified rotator cuff tear or rupture of right shoulder, not specified as traumatic: Secondary | ICD-10-CM | POA: Diagnosis not present

## 2022-06-27 DIAGNOSIS — E119 Type 2 diabetes mellitus without complications: Secondary | ICD-10-CM | POA: Diagnosis not present

## 2022-06-27 DIAGNOSIS — Z7984 Long term (current) use of oral hypoglycemic drugs: Secondary | ICD-10-CM | POA: Diagnosis not present

## 2022-06-27 DIAGNOSIS — Z7982 Long term (current) use of aspirin: Secondary | ICD-10-CM | POA: Diagnosis not present

## 2022-06-27 NOTE — Progress Notes (Signed)
Called pt back today after getting a VM saying she had questions. I left a vm to call me back if she still had questions.    Elray Mcgregor, [place] Pharmacist Assistant  [phone]

## 2022-07-01 ENCOUNTER — Telehealth: Payer: Self-pay

## 2022-07-01 NOTE — Progress Notes (Signed)
Called to get updated BP readings  Unable to reach pt after several attempts    Elray Mcgregor, [place] Pharmacist Assistant  [phone]

## 2022-07-02 DIAGNOSIS — M25511 Pain in right shoulder: Secondary | ICD-10-CM | POA: Diagnosis not present

## 2022-07-02 DIAGNOSIS — M6281 Muscle weakness (generalized): Secondary | ICD-10-CM | POA: Diagnosis not present

## 2022-07-04 DIAGNOSIS — M6281 Muscle weakness (generalized): Secondary | ICD-10-CM | POA: Diagnosis not present

## 2022-07-04 DIAGNOSIS — M25511 Pain in right shoulder: Secondary | ICD-10-CM | POA: Diagnosis not present

## 2022-07-09 DIAGNOSIS — M6281 Muscle weakness (generalized): Secondary | ICD-10-CM | POA: Diagnosis not present

## 2022-07-09 DIAGNOSIS — M25511 Pain in right shoulder: Secondary | ICD-10-CM | POA: Diagnosis not present

## 2022-07-11 DIAGNOSIS — M25511 Pain in right shoulder: Secondary | ICD-10-CM | POA: Diagnosis not present

## 2022-07-11 DIAGNOSIS — M6281 Muscle weakness (generalized): Secondary | ICD-10-CM | POA: Diagnosis not present

## 2022-07-14 ENCOUNTER — Other Ambulatory Visit: Payer: Self-pay

## 2022-07-14 DIAGNOSIS — M6281 Muscle weakness (generalized): Secondary | ICD-10-CM | POA: Diagnosis not present

## 2022-07-14 DIAGNOSIS — M25511 Pain in right shoulder: Secondary | ICD-10-CM | POA: Diagnosis not present

## 2022-07-14 DIAGNOSIS — B029 Zoster without complications: Secondary | ICD-10-CM | POA: Insufficient documentation

## 2022-07-16 ENCOUNTER — Ambulatory Visit: Payer: Medicare Other | Attending: Cardiology | Admitting: Cardiology

## 2022-07-16 ENCOUNTER — Ambulatory Visit: Payer: Medicare Other | Admitting: Cardiology

## 2022-07-16 DIAGNOSIS — M6281 Muscle weakness (generalized): Secondary | ICD-10-CM | POA: Diagnosis not present

## 2022-07-16 DIAGNOSIS — M25511 Pain in right shoulder: Secondary | ICD-10-CM | POA: Diagnosis not present

## 2022-07-17 ENCOUNTER — Encounter: Payer: Self-pay | Admitting: Cardiology

## 2022-07-21 DIAGNOSIS — M6281 Muscle weakness (generalized): Secondary | ICD-10-CM | POA: Diagnosis not present

## 2022-07-21 DIAGNOSIS — M25511 Pain in right shoulder: Secondary | ICD-10-CM | POA: Diagnosis not present

## 2022-07-22 ENCOUNTER — Telehealth: Payer: Self-pay

## 2022-07-22 NOTE — Progress Notes (Signed)
Care Management & Coordination Services Pharmacy Team  Reason for Encounter: Diabetes  Contacted patient to discuss diabetes disease state.  Unsuccessful outreach. Left voicemail for patient to return call.  Current antihyperglycemic regimen:  Dapagliflozin Pro-Metformin ER 5-'1000mg'$  daily   Adherence Review: Is the patient currently on a STATIN medication? No Is the patient currently on ACE/ARB medication? Yes Does the patient have >5 day gap between last estimated fill dates? No   Chart Updates:  Recent office visits:  06/19/22 [name] MD. Orders Only. D/C Mounjaro, Metformin.   06/10/22 [name] MD. Seen for routine visit. Started on Tirzepatide 2.'5mg'$  weekly.  Recent consult visits:  None  Hospital visits:  None  Medications: Outpatient Encounter Medications as of 07/22/2022  Medication Sig   acyclovir (ZOVIRAX) 400 MG tablet Take 1 tablet (400 mg total) by mouth 2 (two) times daily as needed (outbreak).   albuterol (VENTOLIN HFA) 108 (90 Base) MCG/ACT inhaler Inhale 2 puffs into the lungs every 6 (six) hours as needed for wheezing or shortness of breath.   amLODipine (NORVASC) 5 MG tablet Take 5 mg by mouth daily.   aspirin EC 81 MG tablet Take 81 mg by mouth daily.   azelastine (ASTELIN) 0.1 % nasal spray Place 1 spray into both nostrils 2 (two) times daily as needed for allergies or rhinitis.   Blood Glucose Monitoring Suppl (ONE TOUCH ULTRA 2) w/Device KIT Use as instructed five times daily. E11.69   cetirizine (ZYRTEC) 10 MG tablet Take 1 tablet (10 mg total) by mouth daily as needed for allergies.   Cholecalciferol (VITAMIN D PO) Take 2,000 Units by mouth daily.    Dapagliflozin Pro-metFORMIN ER (XIGDUO XR) 10-998 MG TB24 Take 1 tablet by mouth daily.   Elderberry 575 MG/5ML SYRP Take 5 mLs by mouth daily.   ezetimibe (ZETIA) 10 MG tablet TAKE 1 TABLET BY MOUTH DAILY   fluticasone (FLONASE) 50 MCG/ACT nasal spray USE 1 SPRAY IN EACH NOSTRIL EVERY DAY    Glucose Blood (BLOOD GLUCOSE TEST STRIPS 333) STRP 100 each by In Vitro route 3 (three) times daily before meals.   ibuprofen (ADVIL) 800 MG tablet Take 800 mg by mouth every 8 (eight) hours as needed for headache.   Lancets 30G MISC 3 Boxes by Does not apply route every 4 (four) hours as needed.   losartan-hydrochlorothiazide (HYZAAR) 100-25 MG tablet TAKE 1 TABLET BY MOUTH DAILY   meloxicam (MOBIC) 7.5 MG tablet Take 7.5 mg by mouth daily as needed.   methocarbamol (ROBAXIN) 500 MG tablet Take 1 tablet (500 mg total) by mouth 2 (two) times daily.   metoprolol (TOPROL-XL) 200 MG 24 hr tablet Take 1 tablet (200 mg total) by mouth daily.   Multiple Vitamins-Minerals (COMPLETE ENERGY) TABS Take 1 tablet by mouth daily.   nitroGLYCERIN (NITROSTAT) 0.4 MG SL tablet Place 1 tablet (0.4 mg total) under the tongue every 5 (five) minutes as needed for chest pain.   Omega-3 Fatty Acids (FISH OIL) 1000 MG CAPS Take 1 capsule by mouth daily.   omeprazole (PRILOSEC) 20 MG capsule TAKE 1 CAPSULE BY MOUTH DAILY   traMADol (ULTRAM) 50 MG tablet Take 50 mg by mouth 2 (two) times daily as needed for pain.   triamcinolone ointment (KENALOG) 0.1 % Apply 1 application topically 2 (two) times daily.   Turmeric (QC TUMERIC COMPLEX PO) Take 1,000 mg by mouth daily. Every other day   Facility-Administered Encounter Medications as of 07/22/2022  Medication   triamcinolone acetonide (KENALOG-40) injection 60  mg    Recent Relevant Labs: Lab Results  Component Value Date/Time   HGBA1C 7.6 (H) 06/10/2022 11:22 AM   HGBA1C 7.4 (H) 02/28/2022 09:50 AM   MICROALBUR 10 12/25/2020 10:17 AM    Kidney Function Lab Results  Component Value Date/Time   CREATININE 0.88 06/10/2022 11:22 AM   CREATININE 0.84 02/28/2022 09:50 AM   GFRNONAA >60 09/14/2008 03:58 PM   GFRAA  09/14/2008 03:58 PM    >60        The eGFR has been calculated using the MDRD equation. This calculation has not been validated in all  clinical situations. eGFR's persistently <60 mL/min signify possible Chronic Kidney Disease.    Star Rating Drugs:  Medication:  Last Fill: Day Supply Dapagliflozin Pro-Metformin 06/19/22 90ds (Waiting on PAP)  Care Gaps: Annual wellness visit in last year? No, 05/14/21 Last eye exam / retinopathy screening: 01/21/22 Last diabetic foot exam: 06/10/22   Mindy Ingram, [place] Pharmacist Assistant  [phone]

## 2022-07-23 ENCOUNTER — Telehealth: Payer: Self-pay

## 2022-07-23 DIAGNOSIS — M25511 Pain in right shoulder: Secondary | ICD-10-CM | POA: Diagnosis not present

## 2022-07-23 DIAGNOSIS — M6281 Muscle weakness (generalized): Secondary | ICD-10-CM | POA: Diagnosis not present

## 2022-07-23 NOTE — Telephone Encounter (Signed)
I left a message on the number listed in the patients chart requesting the patient to call back regarding the [facility] appointment for 07/29/2022. The provider is out of the office that day. The appointment has been canceled. NOTE: this patient has an upcoming appointment in June 2024 for a chronic fasting f.up. I am unsure if this appointment for 07/29/2022 will need to be rescheduled or not. The appointment for 07/29/2022 did not have any appointment notes so I am unsure what she is coming in for. I stated in the VM this same information to the patient and asked her to call the office back if the appointment needs to be rescheduled or not.

## 2022-07-28 DIAGNOSIS — M25511 Pain in right shoulder: Secondary | ICD-10-CM | POA: Diagnosis not present

## 2022-07-28 DIAGNOSIS — M6281 Muscle weakness (generalized): Secondary | ICD-10-CM | POA: Diagnosis not present

## 2022-07-29 ENCOUNTER — Ambulatory Visit: Payer: Medicare Other | Admitting: Nurse Practitioner

## 2022-07-30 DIAGNOSIS — M25511 Pain in right shoulder: Secondary | ICD-10-CM | POA: Diagnosis not present

## 2022-07-30 DIAGNOSIS — M6281 Muscle weakness (generalized): Secondary | ICD-10-CM | POA: Diagnosis not present

## 2022-08-06 DIAGNOSIS — M6281 Muscle weakness (generalized): Secondary | ICD-10-CM | POA: Diagnosis not present

## 2022-08-06 DIAGNOSIS — M25511 Pain in right shoulder: Secondary | ICD-10-CM | POA: Diagnosis not present

## 2022-08-07 ENCOUNTER — Telehealth: Payer: Self-pay

## 2022-08-07 NOTE — Addendum Note (Signed)
Addended by: Erie Noe on: 08/07/2022 02:23 PM   Modules accepted: Level of Service

## 2022-08-07 NOTE — Telephone Encounter (Signed)
N/A unable to leave a message for patient to call back to schedule Medicare Annual Wellness Visit   Last AWV  05/14/21  Please schedule at anytime with Shelle Iron, LPN if patient calls the office back.    28 Minutes appointment   Any questions, please call me at [phone]

## 2022-08-08 ENCOUNTER — Other Ambulatory Visit: Payer: Self-pay | Admitting: Family Medicine

## 2022-08-11 ENCOUNTER — Telehealth: Payer: Self-pay

## 2022-08-11 NOTE — Progress Notes (Signed)
Care Management & Coordination Services Pharmacy Team  Reason for Encounter: Appointment Reminder  Contacted patient to confirm telephone appointment with Arizona Constable, PharmD on 08/13/22 at [place] with patient on 08/11/2022   Do you have any problems getting your medications? No  What is your top health concern you would like to discuss at your upcoming visit? Pt wants to talk about Dapagliflozin Pro-metFORMIN ER  and maybe see if there is something else she can take. She is going to apply for PAP but has not filled out the application yet.   Have you seen any other providers since your last visit with PCP? Yes, pt just had surgery   Chart review:  Recent office visits:  None  Recent consult visits:  None  Hospital visits:  None   Star Rating Drugs:  Medication:  Last Fill: Day Supply Dapagliflozin   06/19/22  90ds Losartan-HCTZ 06/01/22-03/05/22 100ds  Care Gaps: Annual wellness visit in last year? Yes, 05/26/22  If Diabetic: Last eye exam / retinopathy screening: 01/21/22 Last diabetic foot exam: 06/10/22   Elray Mcgregor, [place] Pharmacist Assistant  [phone]

## 2022-08-13 ENCOUNTER — Ambulatory Visit: Payer: Medicare Other

## 2022-08-13 ENCOUNTER — Other Ambulatory Visit: Payer: Self-pay | Admitting: Nurse Practitioner

## 2022-08-13 DIAGNOSIS — E1169 Type 2 diabetes mellitus with other specified complication: Secondary | ICD-10-CM

## 2022-08-13 MED ORDER — SEMAGLUTIDE(0.25 OR 0.5MG/DOS) 2 MG/3ML ~~LOC~~ SOPN: 0.2500 mg | PEN_INJECTOR | SUBCUTANEOUS | 0 refills | Status: DC

## 2022-08-13 NOTE — Patient Outreach (Signed)
Care Management & Coordination Services Pharmacy Note  08/13/2022 Name:  Mindy Ingram MRN:  [id number] DOB:  Sep 18, 1945  Summary: Very pleasant 77 year old female presents for f/u CCM visit. She just completed her BS in Theology at the age of 20. She was married at 20 and has 1 child with 5 grandchildren. In her free time, she loved taking Karate classes but when Covid started, she had to stop Had Shoulder Sx Jan 2024 and is recovering nicely per patient  Recommendations/Changes made from today's visit: -Patient doesn't want to use Xigduo (Too expensive). Would like to try Ozempic PAP first. Verbal from PCP via direct msg to use Ozempic    Subjective: Mindy Ingram is an 77 y.o. year old female who is a primary patient of Cox, Kirsten, MD.  The care coordination team was consulted for assistance with disease management and care coordination needs.    Engaged with patient by telephone for follow up visit.  Recent office visits:  None   Recent consult visits:  None   Hospital visits:  None   Objective:  Lab Results  Component Value Date   CREATININE 0.88 06/10/2022   BUN 17 06/10/2022   EGFR 68 06/10/2022   GFRNONAA >60 09/14/2008   GFRAA  09/14/2008    >60        The eGFR has been calculated using the MDRD equation. This calculation has not been validated in all clinical situations. eGFR's persistently <60 mL/min signify possible Chronic Kidney Disease.   NA 140 06/10/2022   K 3.7 06/10/2022   CALCIUM 9.5 06/10/2022   CO2 27 06/10/2022   GLUCOSE 109 (H) 06/10/2022    Lab Results  Component Value Date/Time   HGBA1C 7.6 (H) 06/10/2022 11:22 AM   HGBA1C 7.4 (H) 02/28/2022 09:50 AM   MICROALBUR 10 12/25/2020 10:17 AM    Last diabetic Eye exam:  Lab Results  Component Value Date/Time   HMDIABEYEEXA No Retinopathy 01/21/2022 12:00 AM    Last diabetic Foot exam: No results found for: "HMDIABFOOTEX"   Lab Results  Component Value Date   CHOL 180  06/10/2022   HDL 60 06/10/2022   LDLCALC 105 (H) 06/10/2022   TRIG 84 06/10/2022   CHOLHDL 3.0 06/10/2022       Latest Ref Rng & Units 06/10/2022   11:22 AM 02/28/2022    9:50 AM 11/19/2021   10:04 AM  Hepatic Function  Total Protein 6.0 - 8.5 g/dL 6.6  6.7  6.8   Albumin 3.8 - 4.8 g/dL 4.3  4.4  4.3   AST 0 - 40 IU/L 16  21  22   $ ALT 0 - 32 IU/L 15  17  16   $ Alk Phosphatase 44 - 121 IU/L 50  44  51   Total Bilirubin 0.0 - 1.2 mg/dL <0.2  <0.2  0.3     Lab Results  Component Value Date/Time   TSH 1.370 11/19/2021 10:04 AM   TSH 1.450 09/20/2020 10:21 AM       Latest Ref Rng & Units 06/10/2022   11:22 AM 02/28/2022    9:50 AM 11/19/2021   10:04 AM  CBC  WBC 3.4 - 10.8 x10E3/uL 5.2  5.1  5.1   Hemoglobin 11.1 - 15.9 g/dL 13.0  12.8  12.6   Hematocrit 34.0 - 46.6 % 38.8  39.1  37.1   Platelets 150 - 450 x10E3/uL 323  298  309     No results found for: "VD25OH", "VITAMINB12"  Clinical ASCVD: Yes  The 10-year ASCVD risk score (Arnett DK, et al., 2019) is: 29.6%   Values used to calculate the score:     Age: 77 years     Sex: Female     Is Non-Hispanic African American: Yes     Diabetic: Yes     Tobacco smoker: No     Systolic Blood Pressure: 123456 mmHg     Is BP treated: Yes     HDL Cholesterol: 60 mg/dL     Total Cholesterol: 180 mg/dL    Other: (CHADS2VASc if Afib, MMRC or CAT for COPD, ACT, DEXA)     05/26/2022    1:02 PM 02/28/2022    9:20 AM 08/06/2021   10:12 AM  Depression screen PHQ 2/9  Decreased Interest 0 0 0  Down, Depressed, Hopeless 0 0 0  PHQ - 2 Score 0 0 0     Social History   Tobacco Use  Smoking Status Never  Smokeless Tobacco Never   BP Readings from Last 3 Encounters:  06/10/22 118/62  05/27/22 116/62  02/28/22 116/72   Pulse Readings from Last 3 Encounters:  06/10/22 73  05/27/22 95  02/28/22 72   Wt Readings from Last 3 Encounters:  06/10/22 148 lb 8 oz (67.4 kg)  05/27/22 150 lb 3.2 oz (68.1 kg)  02/28/22 150 lb (68 kg)    BMI Readings from Last 3 Encounters:  06/10/22 29.99 kg/m  05/27/22 29.83 kg/m  02/28/22 30.30 kg/m    Allergies  Allergen Reactions   Dairy Aid [Tilactase]     Hives, diarrhea   Lactose Anaphylaxis and Other (See Comments)   Atorvastatin Other (See Comments)    Severe myalgias   Hydrocodone-Acetaminophen     REACTION: sore throat with vicodin   Hydrocodone-Acetaminophen Other (See Comments)    sore throat with vicodin    Milk-Related Compounds Other (See Comments)   Mirabegron Other (See Comments)    Headache    Oxycontin [Oxycodone Hcl]     "makes me crazy"   Prochlorperazine Edisylate     REACTION: stroke-like symptoms with companzine   Rosuvastatin     Can take brand crestor-severe myalgias   Shellfish Allergy    Umeclidinium-Vilanterol Other (See Comments)    Lactose allergy   Percodan [Oxycodone-Aspirin] Palpitations    Fast heartbeat with percodan    Medications Reviewed Today     Reviewed by [name], MD (Physician) on 06/10/22 at [place] List Status: <None>   Medication Order Taking? Sig Documenting Provider Last Dose Status Informant  acyclovir (ZOVIRAX) 400 MG tablet [id number] Yes Take 1 tablet (400 mg total) by mouth 2 (two) times daily as needed (outbreak). Cox, Kirsten, MD Taking Active   albuterol (VENTOLIN HFA) 108 [phone] Base) MCG/ACT inhaler [id number] Yes Inhale 2 puffs into the lungs every 6 (six) hours as needed for wheezing or shortness of breath. [provider] Taking Active   amLODipine (NORVASC) 5 MG tablet [id number] Yes TAKE ONE-HALF TABLET BY MOUTH  DAILY Cox, Kirsten, MD Taking Active   aspirin EC 81 MG tablet [id number] Yes Take 81 mg by mouth daily. [provider] Taking Active   azelastine (ASTELIN) 0.1 % nasal spray [id number] Yes Place 1 spray into both nostrils 2 (two) times daily as needed for allergies or rhinitis. [provider] Taking Active   Blood Glucose Monitoring Suppl (ONE TOUCH ULTRA 2)  w/Device KIT [id number] Yes Use as instructed five times daily. E11.69 [name], MD Taking  Active   cetirizine (ZYRTEC) 10 MG tablet [id number]  Take 1 tablet (10 mg total) by mouth daily as needed for allergies. Neil Crouch, FNP  Active   Cholecalciferol (VITAMIN D PO) [id number] Yes Take 2,000 Units by mouth daily.  [provider] Taking Active   Elderberry 575 MG/5ML SYRP [id number] Yes Take 5 mLs by mouth daily. [provider] Taking Active   ezetimibe (ZETIA) 10 MG tablet [id number] Yes TAKE 1 TABLET BY MOUTH DAILY Cox, Kirsten, MD Taking Active   fluticasone (FLONASE) 50 MCG/ACT nasal spray [id number] Yes USE 1 SPRAY IN EACH NOSTRIL EVERY DAY Cox, Kirsten, MD Taking Active   glucose blood (ONETOUCH ULTRA) test strip [id number] Yes Use as instructed five times daily. Cox, Kirsten, MD Taking Active   ibuprofen (ADVIL) 800 MG tablet [id number] Yes Take 800 mg by mouth every 8 (eight) hours as needed for headache. [provider] Taking Active   Lancets [facility] ULTRASOFT) lancets [id number] Yes Use as instructed five times daily. Cox, Kirsten, MD Taking Active   losartan-hydrochlorothiazide [facility]) 100-25 MG tablet [id number] Yes TAKE 1 TABLET BY MOUTH DAILY Cox, Kirsten, MD Taking Active   meloxicam (MOBIC) 7.5 MG tablet [id number] Yes Take 7.5 mg by mouth daily as needed. [provider] Taking Active   metFORMIN (GLUCOPHAGE-XR) 500 MG 24 hr tablet [id number] Yes TAKE 1 TABLET BY MOUTH TWICE  DAILY Cox, Kirsten, MD Taking Active   methocarbamol (ROBAXIN) 500 MG tablet [id number] Yes Take 1 tablet (500 mg total) by mouth 2 (two) times daily. Cox, Kirsten, MD Taking Active   metoprolol (TOPROL-XL) 200 MG 24 hr tablet [id number] Yes Take 1 tablet (200 mg total) by mouth daily. [name], MD Taking Active   Multiple Vitamins-Minerals (COMPLETE ENERGY) TABS [id number] Yes Take 1 tablet by mouth daily. [provider] Taking Active Self  nitroGLYCERIN (NITROSTAT)  0.4 MG SL tablet [id number] Yes Place 1 tablet (0.4 mg total) under the tongue every 5 (five) minutes as needed for chest pain. Revankar, Reita Cliche, MD Taking Active   Omega-3 Fatty Acids (FISH OIL) 1000 MG CAPS [id number] Yes Take 1 capsule by mouth daily. [provider] Taking Active   omeprazole (PRILOSEC) 20 MG capsule [id number] Yes TAKE 1 CAPSULE BY MOUTH DAILY Cox, Kirsten, MD Taking Active   tirzepatide [facility]) 2.5 MG/0.5ML Pen [id number] Yes Inject 2.5 mg into the skin once a week. Cox, Kirsten, MD  Active   traMADol Veat[facility]bon) 50 MG tablet [id number] Yes Take 50 mg by mouth 2 (two) times daily as needed for pain. [provider] Taking Active   triamcinolone acetonide (KENALOG-40) injection 60 mg [id number]   [name], PA-C  Active   triamcinolone ointment (KENALOG) 0.1 % [id number] Yes Apply 1 application topically 2 (two) times daily. [name], MD Taking Active   Turmeric (QC TUMERIC COMPLEX PO) [id number] Yes Take 1,000 mg by mouth daily. Every other day [provider] Taking Active   Med List Note Annamaria Boots, Kasandra Knudsen, MD 03/09/11 1408): CPAP 12, reduced to 10 as of 03/06/2011            SDOH:  (Social Determinants of Health) assessments and interventions performed: Yes SDOH Interventions    Flowsheet Row Care Coordination from 08/13/2022 in [place] from 05/26/2022 in [place] [facility] from 02/11/2022 in [place] [facility] from 01/15/2022 in [place] [facility] from 07/03/2021 in [place]  [facility]  SDOH Interventions       Food Insecurity Interventions -- Intervention Not Indicated -- -- --  Housing Interventions -- Intervention Not Indicated -- -- --  Transportation Interventions Intervention Not Indicated Intervention Not Indicated Intervention Not Indicated Intervention Not Indicated Intervention Not  Indicated  Utilities Interventions -- Intervention Not Indicated -- -- --  Alcohol Usage Interventions -- Intervention Not Indicated (Score <7) -- -- --  Financial Strain Interventions Other (Comment)  [PAP for meds] Intervention Not Indicated Intervention Not Indicated Intervention Not Indicated Intervention Not Indicated  Physical Activity Interventions -- Intervention Not Indicated -- -- --  Stress Interventions -- Intervention Not Indicated -- -- --  Social Connections Interventions -- Intervention Not Indicated -- -- --       Medication Assistance:   Ozempic: -2024: Start PAP   Name and location of Current pharmacy:  [facility] [place], [place] - [address] AT [place] [address] [place] [address] [address] Phone: [phone] Fax: [phone]  [place], [address] [place] [address] Phone: [phone] Fax: [phone]   Compliance/Adherence/Medication fill history: Star Rating Drugs:  Medication:                Last Fill:         Day Supply Dapagliflozin               06/19/22          90ds Losartan-HCTZ           06/01/22-03/05/22 100ds   Care Gaps: Annual wellness visit in last year? Yes, 05/26/22   If Diabetic: Last eye exam / retinopathy screening: 01/21/22 Last diabetic foot exam: 06/10/22   Assessment/Plan    Hypertension (BP goal <140/80) BP Readings from Last 3 Encounters:  06/10/22 118/62  05/27/22 116/62  02/28/22 116/72  -Controlled -Current treatment: amlodipine 82m Appropriate, Query effective,  Losartan-hydrochlorothiazide 100/25 mg daily Appropriate, Query effective,  Metoprolol succinate 200 mg daily Appropriate, Query effective,  -Medications previously tried: none reported  -Current home readings:  28 December 2021: (States tends to be higher in AM before she takes her  meds) 119/69 113/62 125/65 126/66 136/76 140/79 137/79 01/14/22: 152/82 01/15/22: 162/87, 127/77 August 2023 02/11/22: 127/74 02/10/22: 139/80 02/09/22: 126/76 02/08/22: 128/72 02/07/22: 139/77 Feb 2024: 165/79 141/71 169/91 167/(?) -Current dietary habits: eats breakfast and supper mainly.  -Current exercise habits: Was previously in karate and exercise class. She hopes to rejoin the class soon. Had stopped prior to [place]  -Affirmshypotensive/hypertensive symptoms -Educated on BP goals and benefits of medications for prevention of heart attack, stroke and kidney damage; Daily salt intake goal < 2300 mg; Exercise goal of 150 minutes per week; Importance of home blood pressure monitoring; July 2023: BP is elevated. However, patient states she becomes dizzy at a BP around 1123456systolic. Patient is constantly taking half a pill or skipping doses to prevent herself from becoming dizzy (She had a sever fall last month). She is taking Metoprolol and Losartan at night and Amlodipine in the AM. Counseled to take Amlodipine at night to prevent hypotension during the day and because her BP tends to be fine during the day but will be too high in AM when she tests her BP first thing upon waking/before pills.Will have patient write BP daily and write how/when she took meds and f/u next month. Will also let PCP  know August 2023: Switched to taking Amlodpine in PM and BP after meds, readings look great! Feb 2024: BP is elevated again. Spoke with patient, she thinks it's because she hasn't been eating well and is in pain. She'd like a week to see if she can get them back down to normal. Will have Danielle call in a week   Hyperlipidemia: (LDL goal < 100) The 10-year ASCVD risk score (Arnett DK, et al., 2019) is: 29.6%   Values used to calculate the score:     Age: 54 years     Sex: Female     Is Non-Hispanic African American: Yes     Diabetic: Yes     Tobacco smoker: No     Systolic Blood  Pressure: 118 mmHg     Is BP treated: Yes     HDL Cholesterol: 60 mg/dL     Total Cholesterol: 180 mg/dL Lab Results  Component Value Date   CHOL 180 06/10/2022   CHOL 172 02/28/2022   CHOL 159 11/19/2021   Lab Results  Component Value Date   HDL 60 06/10/2022   HDL 48 02/28/2022   HDL 53 11/19/2021   Lab Results  Component Value Date   LDLCALC 105 (H) 06/10/2022   LDLCALC 96 02/28/2022   LDLCALC 86 11/19/2021   Lab Results  Component Value Date   TRIG 84 06/10/2022   TRIG 158 (H) 02/28/2022   TRIG 108 11/19/2021   Lab Results  Component Value Date   CHOLHDL 3.0 06/10/2022   CHOLHDL 3.6 02/28/2022   CHOLHDL 3.0 11/19/2021   No results found for: "LDLDIRECT" Last vitamin D No results found for: "25OHVITD2", "25OHVITD3", "VD25OH" Lab Results  Component Value Date   TSH 1.370 11/19/2021  -Controlled -Current treatment: aspirin ec 81 mg daily Appropriate, Effective, Safe, Accessible Ezetimibe 46m Appropriate, Effective, Safe, Accessible -Medications previously tried: brand name Crestor, atorvastatin -Current dietary patterns: reports breakfast meat and hot dogs  -Current exercise habits: was previously involved in karate and exercise class prior to [place] Cholesterol goals;  Benefits of statin for ASCVD risk reduction; Importance of limiting foods high in cholesterol; Exercise goal of 150 minutes per week; Jan 2023: Recommended considering Crestor 10 mg three times weekly. Patient reports she can tolerate brand name Crestor but the generic leaves her achy. Pharmacist to request Dispense as Written for brand name Crestor.  Assessed patient finances. Patient approved for [employer]to cover cost of Crestor.  July 2023: Patient states she stopped Rosuvastatin once she started Ezetimibe. Still on medslist, will ask PCP about removing. August 2023: PCP removed statin from medslist   Diabetes (A1c goal <8%) Lab Results  Component Value Date   HGBA1C  7.6 (H) 06/10/2022   HGBA1C 7.4 (H) 02/28/2022   HGBA1C 7.2 (H) 11/19/2021   Lab Results  Component Value Date   MICROALBUR 10 12/25/2020   LDLCALC 105 (H) 06/10/2022   CREATININE 0.88 06/10/2022    Lab Results  Component Value Date   NA 140 06/10/2022   K 3.7 06/10/2022   CREATININE 0.88 06/10/2022   EGFR 68 06/10/2022   GFRNONAA >60 09/14/2008   GLUCOSE 109 (H) 06/10/2022    Lab Results  Component Value Date   WBC 5.2 06/10/2022   HGB 13.0 06/10/2022   HCT 38.8 06/10/2022   MCV 90 06/10/2022   PLT 323 06/10/2022    Lab Results  Component Value Date   LABMICR 5.6 08/06/2021   MICROALBUR 10 12/25/2020   -  Controlled -Current medications: Metformin xr 500 mg twice daily Query Appropriate,  Xigduo Query Appropriate,  Feb 2024: Patient never picked up. Still taking Metformin One touch verio test strips twice daily  Lancets daily prior to meal  -Medications previously tried: Metformin 547m 3/day (ADR's) -Current home glucose readings fasting glucose:  May 2022: 120, 121, 136, 156, 124, 128, 116, 128, 164 (birthday cake), 143, 168 (birthday cake) Jan 2023: Didn't have readings on her July 2023: Didn't have readings on her August 2023: 02/11/22: 132 02/09/22: 159 @ 0800, 206 @ 2130, 206 @ 2200 02/08/22: 142 @ 1100, 234 @ 1800,  02/07/22: 142 @ 0830, 171 @ 1900 Feb 2024: 08/12/22: 127 - (193 Post food) - 165 08/11/22: 138, 116 08/10/22: 124, 144 -Denies hypoglycemic/hyperglycemic symptoms -Current meal patterns:  Late breakfast or early lunch: egg, bacon or sausage with grits or pancake occasionally (a few times a month). JDanton ClapCroissant once a week as a treat.   dinner: dines out mainly.  snacks:  more birthday cake lately since celebrating all month long),  drinks: unsweet tea, coffee  -Current exercise: limited since not attending exercise class after COVID -Educated on A1c and blood sugar goals; Complications of diabetes including kidney damage, retinal  damage, and cardiovascular disease; Exercise goal of 150 minutes per week; Benefits of routine self-monitoring of blood sugar; Carbohydrate counting and/or plate method -Counseled to check feet daily and get yearly eye exams -Counseled on diet and exercise extensively Recommended to continue current medication Jan 2023: Patient doesn't want to increase Metformin to 3/day nor does she want to start Ozempic. Updated medslist and will let PCP know August 2023: Sugars looking better. Counseled to test sugars before eating, not after Feb 2024: Patient prescribed Xigduo a few months ago but was too expensive so she didn't start. Does have PAP but she had concerns about it. She told me, "Can we do PAP for Ozempic instead? I was given that before and I've never felt better but someone told me there was no PAP." I explained to her there was a shortage last year but we're able to do PAP now. Will ask Rekha if we can change Xigduo to Ozempic + Metformin per patient's wishes. Sent direct msg to PCP      Disc Degeneration (Goal: manage pain) -Pain Scale Jan 2023: Patient didn't specify, only said she is very content August 2023: Didn't specify pain scale, just that she feels much better Feb 2024: Didn't specify pain scale. Stated had Shoulder Sx last month but feels much better -Controlled -Current treatment  Tramadol 50 mg bid prn Appropriate, Effective, Safe, Accessible Meloxicam 174mQD PRN Appropriate, Effective, Safe, Accessible Acetaminophen Appropriate, Effective, Safe, Accessible -Medications previously tried: IBU July 2023: IBU was on patients meds list and Meloxicam wasn't. Patient states she has both but that she doesn't take both. Asked her which she prefers and that we should stick with one. She called back and said she prefers Meloxicam. Updated Medslist and once again counseled not to take together August 2023: Patient very happy with PT, states is helping pain greatly Feb 2024: Counseled  once again to not take IBU. She states she hasn't. She asked me about Tylenol and I explained she could take Acetaminophen/Tylenol but to get the generic and spent time counseling on 3g/day MDD   CPP F/U March 2024  [place] ConstablePharm.D. - [phone]

## 2022-08-14 ENCOUNTER — Other Ambulatory Visit: Payer: Self-pay | Admitting: Nurse Practitioner

## 2022-08-14 ENCOUNTER — Telehealth: Payer: Self-pay

## 2022-08-14 DIAGNOSIS — E1169 Type 2 diabetes mellitus with other specified complication: Secondary | ICD-10-CM

## 2022-08-14 MED ORDER — METFORMIN HCL ER (MOD) 1000 MG PO TB24: 1000.0000 mg | ORAL_TABLET | Freq: Every day | ORAL | 0 refills | Status: DC

## 2022-08-14 NOTE — Progress Notes (Cosign Needed)
PAP for Ozempic 0.36m has been started and will be mailed to pt to complete.  DElray Mcgregor CEncampmentPharmacist Assistant  [phone]

## 2022-08-15 DIAGNOSIS — M6281 Muscle weakness (generalized): Secondary | ICD-10-CM | POA: Diagnosis not present

## 2022-08-15 DIAGNOSIS — M25511 Pain in right shoulder: Secondary | ICD-10-CM | POA: Diagnosis not present

## 2022-08-19 DIAGNOSIS — M25511 Pain in right shoulder: Secondary | ICD-10-CM | POA: Diagnosis not present

## 2022-08-19 DIAGNOSIS — M6281 Muscle weakness (generalized): Secondary | ICD-10-CM | POA: Diagnosis not present

## 2022-08-19 NOTE — Progress Notes (Signed)
Subjective:   Mindy Ingram is a 77 y.o. female who presents for Medicare Annual (Subsequent) preventive examination.  I connected with  Mindy Ingram on 08/19/22 by a audio enabled telemedicine application and verified that I am speaking with the correct person using two identifiers.  Patient Location: Home  Provider Location: Office/Clinic  I discussed the limitations of evaluation and management by telemedicine. The patient expressed understanding and agreed to proceed.   Review of Systems     Cardiac Risk Factors include: advanced age (>71mn, >>38women);diabetes mellitus     Objective:    There were no vitals filed for this visit. There is no height or weight on file to calculate BMI.     05/14/2021    3:42 PM 07/21/2013    9:28 AM 07/12/2013   11:54 AM  Advanced Directives  Does Patient Have a Medical Advance Directive? No Patient does not have advance directive Patient does not have advance directive  Would patient like information on creating a medical advance directive? No - Patient declined    Pre-existing out of facility DNR order (yellow form or pink MOST form)  No     Current Medications (verified) Outpatient Encounter Medications as of 05/26/2022  Medication Sig   acyclovir (ZOVIRAX) 400 MG tablet Take 1 tablet (400 mg total) by mouth 2 (two) times daily as needed (outbreak).   albuterol (VENTOLIN HFA) 108 (90 Base) MCG/ACT inhaler Inhale 2 puffs into the lungs every 6 (six) hours as needed for wheezing or shortness of breath.   aspirin EC 81 MG tablet Take 81 mg by mouth daily.   azelastine (ASTELIN) 0.1 % nasal spray Place 1 spray into both nostrils 2 (two) times daily as needed for allergies or rhinitis.   Blood Glucose Monitoring Suppl (ONE TOUCH ULTRA 2) w/Device KIT Use as instructed five times daily. E11.69   Cholecalciferol (VITAMIN D PO) Take 2,000 Units by mouth daily.    Elderberry 575 MG/5ML SYRP Take 5 mLs by mouth daily.   ezetimibe  (ZETIA) 10 MG tablet TAKE 1 TABLET BY MOUTH DAILY   fluticasone (FLONASE) 50 MCG/ACT nasal spray USE 1 SPRAY IN EACH NOSTRIL EVERY DAY   losartan-hydrochlorothiazide (HYZAAR) 100-25 MG tablet TAKE 1 TABLET BY MOUTH DAILY   meloxicam (MOBIC) 7.5 MG tablet Take 7.5 mg by mouth daily as needed.   methocarbamol (ROBAXIN) 500 MG tablet Take 1 tablet (500 mg total) by mouth 2 (two) times daily.   Multiple Vitamins-Minerals (COMPLETE ENERGY) TABS Take 1 tablet by mouth daily.   nitroGLYCERIN (NITROSTAT) 0.4 MG SL tablet Place 1 tablet (0.4 mg total) under the tongue every 5 (five) minutes as needed for chest pain.   Omega-3 Fatty Acids (FISH OIL) 1000 MG CAPS Take 1 capsule by mouth daily.   omeprazole (PRILOSEC) 20 MG capsule TAKE 1 CAPSULE BY MOUTH DAILY   traMADol (ULTRAM) 50 MG tablet Take 50 mg by mouth 2 (two) times daily as needed for pain.   triamcinolone ointment (KENALOG) 0.1 % Apply 1 application topically 2 (two) times daily.   Turmeric (QC TUMERIC COMPLEX PO) Take 1,000 mg by mouth daily. Every other day   [DISCONTINUED] amLODipine (NORVASC) 5 MG tablet TAKE ONE-HALF TABLET BY MOUTH  DAILY (Patient taking differently: Take 5 mg by mouth daily.)   [DISCONTINUED] cetirizine (ZYRTEC) 10 MG tablet Take 10 mg by mouth daily as needed for allergies.   [DISCONTINUED] glucose blood (ONETOUCH ULTRA) test strip Use as instructed five times daily.   [  DISCONTINUED] ibuprofen (ADVIL) 800 MG tablet Take 800 mg by mouth every 8 (eight) hours as needed for headache.   [DISCONTINUED] Lancets (ONETOUCH ULTRASOFT) lancets Use as instructed five times daily.   [DISCONTINUED] metFORMIN (GLUCOPHAGE-XR) 500 MG 24 hr tablet TAKE 1 TABLET BY MOUTH TWICE  DAILY   [DISCONTINUED] metoprolol (TOPROL-XL) 200 MG 24 hr tablet Take 1 tablet (200 mg total) by mouth daily.   Facility-Administered Encounter Medications as of 05/26/2022  Medication   triamcinolone acetonide (KENALOG-40) injection 60 mg    Allergies  (verified) Dairy aid [tilactase], Lactose, Atorvastatin, Hydrocodone-acetaminophen, Hydrocodone-acetaminophen, Milk-related compounds, Mirabegron, Oxycontin [oxycodone hcl], Prochlorperazine edisylate, Rosuvastatin, Shellfish allergy, Umeclidinium-vilanterol, and Percodan [oxycodone-aspirin]   History: Past Medical History:  Diagnosis Date   Allergic asthma, mild intermittent, uncomplicated [id number]   Office Spirometry 04/03/2015- WNL FVC 2.14/110%, FEV1 1.94/128%, FEV1/FVC 0.91  Office spirometry- 11/04/16--by allergy office- FEV1/FVC 0.77 WNL   Bilateral impacted cerumen 07/01/2018   BMI 29.0-29.9,adult 06/10/2022   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   Cervical radicular pain 01/20/2022   Chronic back pain    Contusion of head, initial encounter 03/18/2021   [place] of toe 06/25/2020   Degeneration of lumbosacral intervertebral disc 01/26/2012   DJD (degenerative joint disease)    DM type 2 with diabetic mixed hyperlipidemia ([place]) 04/08/2021   Dyslipidemia 01/23/2015   Essential hypertension 01/23/2015   Fibromyalgia    GERD 10/17/2007   Qualifier: Diagnosis of  By: Annamaria Boots MD, [place] D    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   Hyperlipidemia 06/25/2020   Hypertension associated with diabetes ([place]) 01/23/2015   Lateral epicondylitis 06/25/2020   Lumbar facet arthropathy 01/20/2022   Menopause 06/25/2020   Multiple benign melanocytic nevi 06/25/2020   Myalgia 03/03/2022   Myalgia due to statin 03/03/2022   Neck pain 12/12/2021   Obesity 07/10/2021   OSA (obstructive sleep apnea)    cpap setting of 10   Preop cardiovascular exam 06/10/2022   Right rotator cuff tear 06/25/2020   Right temporal frontal scalp contusions 03/18/2021   Seasonal and perennial allergic rhinitis 10/17/2007   Allergy vaccine 2002- dc'd   Shingles    Shoulder joint pain 06/25/2020   Statin myopathy 06/10/2022   Thoracic spine pain 02/23/2013   Type II or unspecified type diabetes mellitus  without mention of complication, not stated as uncontrolled    Past Surgical History:  Procedure Laterality Date   [place]  2013   lower back with plates and screws   benign lump right axilla     BILATERAL SALPINGOOPHORECTOMY  [place]   at [facility], slight limitation with turning neck   CHOLECYSTECTOMY  1995   EUS N/A 07/21/2013   Procedure: UPPER ENDOSCOPIC ULTRASOUND (EUS) LINEAR;  Surgeon: Milus Banister, MD;  Location: [facility];  Service: Endoscopy;  Laterality: N/A;   FOOT SURGERY     2007 - right great toe, 2000 - right fifth digit .   NEUROPLASTY / TRANSPOSITION MEDIAN NERVE AT [place]   PARTIAL COLECTOMY  1995   benign adhesions   PARTIAL HYSTERECTOMY  1980s.   abdominal   right foot surgery  2000   right great toe with artificial bone inserted   right knee arthroscopy Right 02/2017   Family History  Problem Relation Age of Onset   Heart disease Mother    Prostate cancer Father    Heart attack Sister  Breast cancer Maternal Aunt    Prostate cancer Paternal Uncle    Allergies Neg Hx    Asthma Neg Hx    Eczema Neg Hx    Immunodeficiency Neg Hx    Social History   Socioeconomic History   Marital status: Widowed    Spouse name: Not on file   Number of children: 1   Years of education: Not on file   Highest education level: Not on file  Occupational History   Occupation: Retired  Tobacco Use   Smoking status: Never   Smokeless tobacco: Never  Vaping Use   Vaping Use: Never used  Substance and Sexual Activity   Alcohol use: No   Drug use: No   Sexual activity: Not on file  Other Topics Concern   Not on file  Social History Narrative   Not on file   Social Determinants of Health   Financial Resource Strain: High Risk (08/13/2022)   Overall Financial Resource Strain (CARDIA)    Difficulty of Paying Living Expenses: Hard  Food Insecurity: No Food Insecurity (05/26/2022)    Hunger Vital Sign    Worried About Running Out of Food in the Last Year: Never true    Ran Out of Food in the Last Year: Never true  Transportation Needs: No Transportation Needs (08/13/2022)   PRAPARE - Hydrologist (Medical): No    Lack of Transportation (Non-Medical): No  Physical Activity: Insufficiently Active (05/26/2022)   Exercise Vital Sign    Days of Exercise per Week: 2 days    Minutes of Exercise per Session: 40 min  Stress: No Stress Concern Present (05/26/2022)   [place]    Feeling of Stress : Not at all  Social Connections: Moderately Isolated (05/26/2022)   Social Connection and Isolation Panel [NHANES]    Frequency of Communication with Friends and Family: More than three times a week    Frequency of Social Gatherings with Friends and Family: More than three times a week    Attends Religious Services: More than 4 times per year    Active Member of [employer] or Organizations: No    Attends Archivist Meetings: Never    Marital Status: Widowed    Tobacco Counseling Counseling given: Not Answered   Clinical Intake:     Pain : No/denies pain     BMI - recorded: 30.28 Nutritional Status: BMI > 30  Obese Diabetes: Yes  How often do you need to have someone help you when you read instructions, pamphlets, or other written materials from your doctor or pharmacy?: 1 - Never  Diabetic?YES         Activities of Daily Living    05/26/2022    1:05 PM  In your present state of health, do you have any difficulty performing the following activities:  Hearing? 0  Vision? 0  Difficulty concentrating or making decisions? 0  Walking or climbing stairs? 0  Dressing or bathing? 0  Doing errands, shopping? 0  Preparing Food and eating ? N  Using the Toilet? N  In the past six months, have you accidently leaked urine? N  Do you have problems with loss of bowel  control? N  Managing your Medications? N  Managing your Finances? N  Housekeeping or managing your Housekeeping? N    Patient Care Team: [name], MD as PCP - General (Family Medicine) Nehemiah Settle, MD (Gastroenterology) Hennie Duos, MD  as Consulting Physician (Rheumatology) Sharyn Dross., DPM (Podiatry) Lane Hacker, [facility] (Pharmacist) Darleen Crocker, MD as Consulting Physician (Ophthalmology) Valente David, OD (Optometry)  Indicate any recent Medical Services you may have received from other than [facility] providers in the past year (date may be approximate).     Assessment:   This is a routine wellness examination for [place].  Hearing/Vision screen No results found.  Dietary issues and exercise activities discussed: Current Exercise Habits: Home exercise routine, Time (Minutes): 45, Frequency (Times/Week): 2, Weekly Exercise (Minutes/Week): 90, Intensity: Mild   Goals Addressed   None   Depression Screen    05/26/2022    1:02 PM 02/28/2022    9:20 AM 08/06/2021   10:12 AM 05/14/2021    3:43 PM 02/26/2021   10:16 AM 12/25/2020   10:01 AM 11/21/2020    9:51 AM  PHQ 2/9 Scores  PHQ - 2 Score 0 0 0 0 0 0 0    Fall Risk    06/10/2022    9:37 AM 05/26/2022    1:05 PM 02/28/2022    9:20 AM 11/19/2021    1:11 PM 11/19/2021    1:07 PM  [place] in the past year? 1 1 1 $ 0 0  Number falls in past yr: 1 0 0 0 0  Injury with Fall? 1 1 1 $ 0 0  Risk for fall due to : History of fall(s) No Fall Risks  No Fall Risks No Fall Risks  Follow up Falls evaluation completed Falls evaluation completed  Falls evaluation completed Falls evaluation completed    [place]:  Any stairs in or around the home? No  If so, are there any without handrails?  N/A Home free of loose throw rugs in walkways, pet beds, electrical cords, etc? Yes  Adequate lighting in your home to reduce risk of falls? Yes   ASSISTIVE DEVICES UTILIZED TO PREVENT  FALLS:  Life alert? No  Use of a cane, walker or w/c?  Use a cane if she have too, Has a walker in case she needs it around the house. Grab bars in the bathroom? Yes  Shower chair or bench in shower? Yes  Elevated toilet seat or a handicapped toilet? No    Cognitive Function:        05/26/2022    1:07 PM 05/14/2021    3:46 PM  6CIT Screen  What Year? 0 points 0 points  What month? 0 points 0 points  What time? 0 points 0 points  Count back from 20 0 points 0 points  Months in reverse 0 points 0 points  Repeat phrase 0 points 0 points  Total Score 0 points 0 points    Immunizations Immunization History  Administered Date(s) Administered   Fluad Quad(high Dose 65+) 04/08/2021, 04/16/2022   Hepatitis B, ADULT 07/01/2003   Influenza Split 03/12/2011, 03/30/2012, 04/01/2013, 04/04/2014, 04/03/2015, 04/02/2016, 03/30/2017, 04/14/2020   Influenza Whole 04/30/2009, 03/06/2011   Influenza, High Dose Seasonal PF 03/30/2017, 03/11/2018, 03/11/2018, 03/16/2019   Influenza,inj,Quad PF,6+ Mos 03/30/2013, 03/30/2014, 04/03/2015, 04/02/2016   Moderna Covid-19 Vaccine Bivalent Booster 27yr & up 04/08/2021   Moderna Sars-Covid-2 Vaccination 08/05/2019, 08/31/2019, 04/25/2020   Pneumococcal Conjugate-13 02/16/2014   Pneumococcal Polysaccharide-23 06/30/1996, 10/28/2016   Td 07/01/2003   Tdap 01/16/2012   Zoster, Live 07/27/2013, 05/09/2020, 08/12/2020    TDAP status: Up to date  Flu Vaccine status: Up to date  Pneumococcal vaccine status: Up to date  Covid-19  vaccine status: Completed vaccines  Qualifies for Shingles Vaccine? YES  Zostavax completed No   Shingrix Completed?: No.    Education has been provided regarding the importance of this vaccine. Patient has been advised to call insurance company to determine out of pocket expense if they have not yet received this vaccine. Advised may also receive vaccine at local pharmacy or Health Dept. Verbalized acceptance and  understanding.  Screening Tests Health Maintenance  Topic Date Due   Hepatitis C Screening  Never done   Zoster Vaccines- Shingrix (1 of 2) Never done   DTaP/Tdap/Td (3 - Td or Tdap) 01/15/2022   COVID-19 Vaccine (5 - 2023-24 season) 02/28/2022   COLONOSCOPY (Pts 45-17yr Insurance coverage will need to be confirmed)  06/12/2022   Diabetic kidney evaluation - Urine ACR  08/06/2022   HEMOGLOBIN A1C  12/10/2022   OPHTHALMOLOGY EXAM  01/22/2023   Medicare Annual Wellness (AWV)  05/27/2023   Diabetic kidney evaluation - eGFR measurement  06/11/2023   FOOT EXAM  06/11/2023   MAMMOGRAM  06/27/2023   Pneumonia Vaccine 77 Years old  Completed   INFLUENZA VACCINE  Completed   DEXA SCAN  Completed   HPV VACCINES  Aged Out    Health Maintenance  Health Maintenance Due  Topic Date Due   Hepatitis C Screening  Never done   Zoster Vaccines- Shingrix (1 of 2) Never done   DTaP/Tdap/Td (3 - Td or Tdap) 01/15/2022   COVID-19 Vaccine (5 - 2023-24 season) 02/28/2022   COLONOSCOPY (Pts 45-465yrInsurance coverage will need to be confirmed)  06/12/2022   Diabetic kidney evaluation - Urine ACR  08/06/2022    Colorectal cancer screening: Type of screening: Colonoscopy. Completed 06/16/2017. Repeat every 5 years  Mammogram status: Ordered 06/26/2022. Pt provided with contact info and advised to call to schedule appt.   Bone Density status: Completed 10/28/2016. Results reflect: Bone density results: NORMAL. Repeat every 0 years.  Lung Cancer Screening: (Low Dose CT Chest recommended if Age 77-80ears, 30 pack-year currently smoking OR have quit w/in 15years.) does not qualify.   Lung Cancer Screening Referral: N/A  Additional Screening:  Hepatitis C Screening: does not qualify; Completed N/A  Vision Screening: Recommended annual ophthalmology exams for early detection of glaucoma and other disorders of the eye. Is the patient up to date with their annual eye exam?  Yes  Who is the  provider or what is the name of the office in which the patient attends annual eye exams? Dr. If pt is not established with a provider, would they like to be referred to a provider to establish care? No .   Dental Screening: Recommended annual dental exams for proper oral hygiene  Community Resource Referral / Chronic Care Management: CRR required this visit?   N/A  CCM required this visit?   Already have CCM     Plan:     I have personally reviewed and noted the following in the patient's chart:   Medical and social history Use of alcohol, tobacco or illicit drugs  Current medications and supplements including opioid prescriptions. Patient is currently taking opioid prescriptions. Information provided to patient regarding non-opioid alternatives. Patient advised to discuss non-opioid treatment plan with their provider. Functional ability and status Nutritional status Physical activity Advanced directives List of other physicians Hospitalizations, surgeries, and ER visits in previous 12 months Vitals Screenings to include cognitive, depression, and falls Referrals and appointments  In addition, I have reviewed and discussed with patient certain preventive protocols,  quality metrics, and best practice recommendations. A written personalized care plan for preventive services as well as general preventive health recommendations were provided to patient.     [name], MD   08/19/2022    Ms. Ciriello , Thank you for taking time to come for your Medicare Wellness Visit. I appreciate your ongoing commitment to your health goals. Please review the following plan we discussed and let me know if I can assist you in the future.   These are the goals we discussed:  Goals      Manage My Medicine     Timeframe:  Long-Range Goal Priority:  High Start Date:                             Expected End Date:                       Follow Up Date 06/2021   - call for medicine refill 2  or 3 days before it runs out - keep a list of all the medicines I take; vitamins and herbals too - use a pillbox to sort medicine    Why is this important?   These steps will help you keep on track with your medicines.   Notes:      Monitor and Manage My Blood Sugar-Diabetes Type 2     Timeframe:  Long-Range Goal Priority:  High Start Date:                             Expected End Date:                       Follow Up Date 06/2021   - check blood sugar at prescribed times - check blood sugar if I feel it is too high or too low - take the blood sugar log to all doctor visits    Why is this important?   Checking your blood sugar at home helps to keep it from getting very high or very low.  Writing the results in a diary or log helps the doctor know how to care for you.  Your blood sugar log should have the time, date and the results.  Also, write down the amount of insulin or other medicine that you take.  Other information, like what you ate, exercise done and how you were feeling, will also be helpful.     Notes:      Track and Manage My Blood Pressure-Hypertension     Timeframe:  Long-Range Goal Priority:  High Start Date:                             Expected End Date:                       Follow Up Date 06/2021   - check blood pressure daily - write blood pressure results in a log or diary    Why is this important?   You won't feel high blood pressure, but it can still hurt your blood vessels.  High blood pressure can cause heart or kidney problems. It can also cause a stroke.  Making lifestyle changes like losing a little weight or eating less salt will help.  Checking your blood pressure at home and at  different times of the day can help to control blood pressure.  If the doctor prescribes medicine remember to take it the way the doctor ordered.  Call the office if you cannot afford the medicine or if there are questions about it.     Notes:         This is  a list of the screening recommended for you and due dates:  Health Maintenance  Topic Date Due   Hepatitis C Screening: USPSTF Recommendation to screen - Ages 56-79 yo.  Never done   Zoster (Shingles) Vaccine (1 of 2) Never done   DTaP/Tdap/Td vaccine (3 - Td or Tdap) 01/15/2022   COVID-19 Vaccine (5 - 2023-24 season) 02/28/2022   Colon Cancer Screening  06/12/2022   Yearly kidney health urinalysis for diabetes  08/06/2022   Hemoglobin A1C  12/10/2022   Eye exam for diabetics  01/22/2023   Medicare Annual Wellness Visit  05/27/2023   Yearly kidney function blood test for diabetes  06/11/2023   Complete foot exam   06/11/2023   Mammogram  06/27/2023   Pneumonia Vaccine  Completed   Flu Shot  Completed   DEXA scan (bone density measurement)  Completed   HPV Vaccine  Aged Out    I attest that I have reviewed this visit and agree with the plan established.    [name], MD

## 2022-08-21 ENCOUNTER — Telehealth: Payer: Self-pay

## 2022-08-21 NOTE — Progress Notes (Signed)
Called to get updated BP readings: Checking in morning and evening  08/27/22 124/67 76, 143/77 83 08/26/22 122/70 75, 134/71 69 08/25/22 130/72 72, 132/72 64 08/24/22 126/70 67, 153/81 85 08/23/22 142/77 99, 120/66 65 08/22/22 133/71 73, 134/72 72 08/21/22 142/76 65, 140/78 [place], CMA Catering manager  [phone]

## 2022-08-26 DIAGNOSIS — M25511 Pain in right shoulder: Secondary | ICD-10-CM | POA: Diagnosis not present

## 2022-08-26 DIAGNOSIS — M6281 Muscle weakness (generalized): Secondary | ICD-10-CM | POA: Diagnosis not present

## 2022-09-01 ENCOUNTER — Telehealth: Payer: Self-pay | Admitting: Internal Medicine

## 2022-09-01 NOTE — Telephone Encounter (Signed)
Pt wants to have a Sleep Study done to get off Cpap Machine

## 2022-09-01 NOTE — Telephone Encounter (Signed)
Attempted to call patient but mailbox is full.  

## 2022-09-01 NOTE — Telephone Encounter (Signed)
Please call pt. Back again she called back again but she was at another dr. Vertis Kelch.

## 2022-09-02 NOTE — Telephone Encounter (Signed)
Notes in order states [employer] did not require a prior auth but [place] documented [facility] is still pending.  I sent a message to [facility] to verify if [facility] is still pending.

## 2022-09-02 NOTE — Telephone Encounter (Signed)
Scheduled pt's hst - nothing further needed.

## 2022-09-02 NOTE — Telephone Encounter (Signed)
Dr. Janee Morn last AVS notes indicate the following:  It is appropriate now to update sleep study.  Meanwhile she can continue CPAP auto 5-19, which has benefited her. Plan- update sleep study  Since she has been appvd by her ins please call to advise HST process. Thanks.  Pls call Pt to advise. [phone] or [phone]

## 2022-09-02 NOTE — Telephone Encounter (Signed)
PT got approval letter for sleep study. Will make appt.

## 2022-09-05 ENCOUNTER — Telehealth: Payer: Self-pay

## 2022-09-05 NOTE — Progress Notes (Signed)
Care Management & Coordination Services Pharmacy Team  Reason for Encounter: Appointment Reminder  Contacted patient to confirm telephone appointment with Arizona Constable, PharmD on 09/09/22  at 11:30 am . Spoke with patient on 09/05/2022   Do you have any problems getting your medications? Yes If yes what types of problems are you experiencing? Financial barriers. Pt still has her application for Ozempic but has not taking it to Dr. Tobie Poet office. She was weary about documenting her income.  What is your top health concern you would like to discuss at your upcoming visit? Note from chart "Patient states that her blood pressure runs high when she takes meloxicam her bp is 150's. If she does not take it her blood pressure is around 130's. She does not want to increase amlodipine without discuss this with the provider. She is not taking meloxicam to often. Please advice. " Pt also stated she is so confused about what Metformin she is supposed to be on. Pt has been taking Metformin Hydrochloride XR '500mg'$  since December and I advised the PCP sent in Metformin '1000mg'$  24hr to [facility] and she had no idea that this was sent. She needs to know what she needs to be on and what she needs to stop. Please advise.  Have you seen any other providers since your last visit with PCP? No   Chart review:  Recent office visits:  08/14/22 Neil Crouch FNP. Orders Only. Started Metformin '1000mg'$  daily.   08/13/22 Neil Crouch FNP. Orders Only. D/C Dapagliflozin Pro-metFORMIN ER  and started on Ozempic 0.'25mg'$ .   Recent consult visits:  None  Hospital visits:  None   Star Rating Drugs:  Medication:  Last Fill: Day Supply Metformin   06/22/22-03/26/22 100ds Losartan-HCTZ 09/04/22-06/01/22 100ds Semaglutide   08/13/22 56ds (Waiting on PAP)   Care Gaps: Annual wellness visit in last year? Yes  If Diabetic: Last eye exam / retinopathy screening:01/21/22 Last diabetic foot exam:06/10/22   Elray Mcgregor,  [facility] Pharmacist Assistant  [phone]

## 2022-09-08 ENCOUNTER — Other Ambulatory Visit: Payer: Self-pay | Admitting: Nurse Practitioner

## 2022-09-08 ENCOUNTER — Ambulatory Visit (INDEPENDENT_AMBULATORY_CARE_PROVIDER_SITE_OTHER): Payer: Medicare Other

## 2022-09-08 DIAGNOSIS — H6123 Impacted cerumen, bilateral: Secondary | ICD-10-CM | POA: Diagnosis not present

## 2022-09-08 DIAGNOSIS — E1169 Type 2 diabetes mellitus with other specified complication: Secondary | ICD-10-CM

## 2022-09-08 MED ORDER — METFORMIN HCL 850 MG PO TABS: 850.0000 mg | ORAL_TABLET | Freq: Two times a day (BID) | ORAL | 1 refills | Status: DC

## 2022-09-08 NOTE — Progress Notes (Signed)
Bilateral Ear Lavage successful.

## 2022-09-09 ENCOUNTER — Ambulatory Visit: Payer: Medicare Other

## 2022-09-09 NOTE — Patient Outreach (Signed)
Care Management & Coordination Services Pharmacy Note  09/09/2022 Name:  Mindy Ingram MRN:  [id number] DOB:  05-Apr-1946  Summary: Very pleasant 77 year old female presents for f/u CCM visit. She just completed her BS in Theology at the age of 55. She was married at 36 and has 1 child with 5 grandchildren. 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 Would like to try Ozempic PAP first. Verbal from PCP via direct msg to use Ozempic    Subjective: Mindy Ingram is an 77 y.o. year old female who is a primary patient of Cox, Kirsten, MD.  The care coordination team was consulted for assistance with disease management and care coordination needs.    Engaged with patient by telephone for follow up visit.  Recent office visits:  None   Recent consult visits:  None   Hospital visits:  None   Objective:  Lab Results  Component Value Date   CREATININE 0.88 06/10/2022   BUN 17 06/10/2022   EGFR 68 06/10/2022   GFRNONAA >60 09/14/2008   GFRAA  09/14/2008    >60        The eGFR has been calculated using the MDRD equation. This calculation has not been validated in all clinical situations. eGFR's persistently <60 mL/min signify possible Chronic Kidney Disease.   NA 140 06/10/2022   K 3.7 06/10/2022   CALCIUM 9.5 06/10/2022   CO2 27 06/10/2022   GLUCOSE 109 (H) 06/10/2022    Lab Results  Component Value Date/Time   HGBA1C 7.6 (H) 06/10/2022 11:22 AM   HGBA1C 7.4 (H) 02/28/2022 09:50 AM   MICROALBUR 10 12/25/2020 10:17 AM    Last diabetic Eye exam:  Lab Results  Component Value Date/Time   HMDIABEYEEXA No Retinopathy 01/21/2022 12:00 AM    Last diabetic Foot exam: No results found for:  "HMDIABFOOTEX"   Lab Results  Component Value Date   CHOL 180 06/10/2022   HDL 60 06/10/2022   LDLCALC 105 (H) 06/10/2022   TRIG 84 06/10/2022   CHOLHDL 3.0 06/10/2022       Latest Ref Rng & Units 06/10/2022   11:22 AM 02/28/2022    9:50 AM 11/19/2021   10:04 AM  Hepatic Function  Total Protein 6.0 - 8.5 g/dL 6.6  6.7  6.8   Albumin 3.8 - 4.8 g/dL 4.3  4.4  4.3   AST 0 - 40 IU/L '16  21  22   '$ ALT 0 - 32 IU/L '15  17  16   '$ Alk Phosphatase 44 - 121 IU/L 50  44  51   Total Bilirubin 0.0 - 1.2 mg/dL <0.2  <0.2  0.3     Lab Results  Component Value Date/Time   TSH 1.370 11/19/2021 10:04 AM   TSH 1.450 09/20/2020 10:21 AM       Latest Ref Rng & Units 06/10/2022   11:22 AM 02/28/2022    9:50 AM 11/19/2021   10:04 AM  CBC  WBC 3.4 - 10.8 x10E3/uL 5.2  5.1  5.1   Hemoglobin 11.1 - 15.9 g/dL 13.0  12.8  12.6   Hematocrit 34.0 - 46.6 % 38.8  39.1  37.1   Platelets 150 - 450 x10E3/uL 323  298  309  No results found for: "VD25OH", "VITAMINB12"  Clinical ASCVD: Yes  The 10-year ASCVD risk score (Arnett DK, et al., 2019) is: 29.6%   Values used to calculate the score:     Age: 59 years     Sex: Female     Is Non-Hispanic African American: Yes     Diabetic: Yes     Tobacco smoker: No     Systolic Blood Pressure: 123456 mmHg     Is BP treated: Yes     HDL Cholesterol: 60 mg/dL     Total Cholesterol: 180 mg/dL    Other: (CHADS2VASc if Afib, MMRC or CAT for COPD, ACT, DEXA)     05/26/2022    1:02 PM 02/28/2022    9:20 AM 08/06/2021   10:12 AM  Depression screen PHQ 2/9  Decreased Interest 0 0 0  Down, Depressed, Hopeless 0 0 0  PHQ - 2 Score 0 0 0     Social History   Tobacco Use  Smoking Status Never  Smokeless Tobacco Never   BP Readings from Last 3 Encounters:  06/10/22 118/62  05/27/22 116/62  02/28/22 116/72   Pulse Readings from Last 3 Encounters:  06/10/22 73  05/27/22 95  02/28/22 72   Wt Readings from Last 3 Encounters:  06/10/22 148 lb 8 oz (67.4 kg)   05/27/22 150 lb 3.2 oz (68.1 kg)  02/28/22 150 lb (68 kg)   BMI Readings from Last 3 Encounters:  06/10/22 29.99 kg/m  05/27/22 29.83 kg/m  02/28/22 30.30 kg/m    Allergies  Allergen Reactions   Dairy Aid [Tilactase]     Hives, diarrhea   Lactose Anaphylaxis and Other (See Comments)   Atorvastatin Other (See Comments)    Severe myalgias   Hydrocodone-Acetaminophen     REACTION: sore throat with vicodin   Hydrocodone-Acetaminophen Other (See Comments)    sore throat with vicodin    Milk-Related Compounds Other (See Comments)   Mirabegron Other (See Comments)    Headache    Oxycontin [Oxycodone Hcl]     "makes me crazy"   Prochlorperazine Edisylate     REACTION: stroke-like symptoms with companzine   Rosuvastatin     Can take brand crestor-severe myalgias   Shellfish Allergy    Umeclidinium-Vilanterol Other (See Comments)    Lactose allergy   Percodan [Oxycodone-Aspirin] Palpitations    Fast heartbeat with percodan    Medications Reviewed Today     Reviewed by [name], MD (Physician) on 06/10/22 at [place] List Status: <None>   Medication Order Taking? Sig Documenting Provider Last Dose Status Informant  acyclovir (ZOVIRAX) 400 MG tablet [id number] Yes Take 1 tablet (400 mg total) by mouth 2 (two) times daily as needed (outbreak). Cox, Kirsten, MD Taking Active   albuterol (VENTOLIN HFA) 108 [phone] Base) MCG/ACT inhaler [id number] Yes Inhale 2 puffs into the lungs every 6 (six) hours as needed for wheezing or shortness of breath. [provider] Taking Active   amLODipine (NORVASC) 5 MG tablet [id number] Yes TAKE ONE-HALF TABLET BY MOUTH  DAILY Cox, Kirsten, MD Taking Active   aspirin EC 81 MG tablet [id number] Yes Take 81 mg by mouth daily. [provider] Taking Active   azelastine (ASTELIN) 0.1 % nasal spray [id number] Yes Place 1 spray into both nostrils 2 (two) times daily as needed for allergies or rhinitis. [provider] Taking  Active   Blood Glucose Monitoring Suppl (ONE TOUCH ULTRA 2) w/Device KIT [id number] Yes Use as instructed five  times daily. E11.69 CoxElnita Maxwell, MD Taking Active   cetirizine (ZYRTEC) 10 MG tablet [id number]  Take 1 tablet (10 mg total) by mouth daily as needed for allergies. Neil Crouch, FNP  Active   Cholecalciferol (VITAMIN D PO) [id number] Yes Take 2,000 Units by mouth daily.  [provider] Taking Active   Elderberry 575 MG/5ML SYRP [id number] Yes Take 5 mLs by mouth daily. [provider] Taking Active   ezetimibe (ZETIA) 10 MG tablet [id number] Yes TAKE 1 TABLET BY MOUTH DAILY Cox, Kirsten, MD Taking Active   fluticasone (FLONASE) 50 MCG/ACT nasal spray [id number] Yes USE 1 SPRAY IN EACH NOSTRIL EVERY DAY Cox, Kirsten, MD Taking Active   glucose blood (ONETOUCH ULTRA) test strip [id number] Yes Use as instructed five times daily. Cox, Kirsten, MD Taking Active   ibuprofen (ADVIL) 800 MG tablet [id number] Yes Take 800 mg by mouth every 8 (eight) hours as needed for headache. [provider] Taking Active   Lancets [facility] ULTRASOFT) lancets [id number] Yes Use as instructed five times daily. Cox, Kirsten, MD Taking Active   losartan-hydrochlorothiazide [facility]) 100-25 MG tablet [id number] Yes TAKE 1 TABLET BY MOUTH DAILY Cox, Kirsten, MD Taking Active   meloxicam (MOBIC) 7.5 MG tablet [id number] Yes Take 7.5 mg by mouth daily as needed. [provider] Taking Active   metFORMIN (GLUCOPHAGE-XR) 500 MG 24 hr tablet [id number] Yes TAKE 1 TABLET BY MOUTH TWICE  DAILY Cox, Kirsten, MD Taking Active   methocarbamol (ROBAXIN) 500 MG tablet [id number] Yes Take 1 tablet (500 mg total) by mouth 2 (two) times daily. Cox, Kirsten, MD Taking Active   metoprolol (TOPROL-XL) 200 MG 24 hr tablet [id number] Yes Take 1 tablet (200 mg total) by mouth daily. [name], MD Taking Active   Multiple Vitamins-Minerals (COMPLETE ENERGY) TABS [id number] Yes Take 1 tablet by mouth daily. [provider] Taking Active Self  nitroGLYCERIN (NITROSTAT) 0.4 MG SL tablet [id number] Yes Place 1 tablet (0.4 mg total) under the tongue every 5 (five) minutes as needed for chest pain. Revankar, Reita Cliche, MD Taking Active   Omega-3 Fatty Acids (FISH OIL) 1000 MG CAPS [id number] Yes Take 1 capsule by mouth daily. [provider] Taking Active   omeprazole (PRILOSEC) 20 MG capsule [id number] Yes TAKE 1 CAPSULE BY MOUTH DAILY Cox, Kirsten, MD Taking Active   tirzepatide [facility]) 2.5 MG/0.5ML Pen [id number] Yes Inject 2.5 mg into the skin once a week. Cox, Kirsten, MD  Active   traMADol Veatrice Bourbon) 50 MG tablet [id number] Yes Take 50 mg by mouth 2 (two) times daily as needed for pain. [provider] Taking Active   triamcinolone acetonide (KENALOG-40) injection 60 mg [id number]   [name], PA-C  Active   triamcinolone ointment (KENALOG) 0.1 % [id number] Yes Apply 1 application topically 2 (two) times daily. [name], MD Taking Active   Turmeric (QC TUMERIC COMPLEX PO) [id number] Yes Take 1,000 mg by mouth daily. Every other day [provider] Taking Active   Med List Note Annamaria Boots, Kasandra Knudsen, MD 03/09/11 1408): CPAP 12, reduced to 10 as of 03/06/2011            SDOH:  (Social Determinants of Health) assessments and interventions performed: Yes SDOH Interventions    [place] Coordination from 08/13/2022 in [place] from 05/26/2022 in [place] Management from 02/11/2022 in [place] Management from 01/15/2022 in [place]  Management from 07/03/2021 in [place]  SDOH Interventions       Food Insecurity Interventions -- Intervention Not Indicated -- -- --  Housing Interventions -- Intervention Not Indicated -- -- --  Transportation Interventions Intervention Not Indicated Intervention Not Indicated  Intervention Not Indicated Intervention Not Indicated Intervention Not Indicated  Utilities Interventions -- Intervention Not Indicated -- -- --  Alcohol Usage Interventions -- Intervention Not Indicated (Score <7) -- -- --  Financial Strain Interventions Other (Comment)  [PAP for meds] Intervention Not Indicated Intervention Not Indicated Intervention Not Indicated Intervention Not Indicated  Physical Activity Interventions -- Intervention Not Indicated -- -- --  Stress Interventions -- Intervention Not Indicated -- -- --  Social Connections Interventions -- Intervention Not Indicated -- -- --       Medication Assistance:   Ozempic: -2024: Start PAP   Name and location of Current pharmacy:  [facility] [place], [place] - [address] [place] [address] Phone: [phone] Fax: [phone]  [place], [place] [address] Phone: [phone] Fax: [phone]   Compliance/Adherence/Medication fill history: Star Rating Drugs:  Medication:                Last Fill:         Day Supply Dapagliflozin               06/19/22          90ds Losartan-HCTZ           06/01/22-03/05/22 100ds   Care Gaps: Annual wellness visit in last year? Yes, 05/26/22   If Diabetic: Last eye exam / retinopathy screening: 01/21/22 Last diabetic foot exam: 06/10/22   Assessment/Plan    Hypertension (BP goal <140/80) BP Readings from Last 3 Encounters:  06/10/22 118/62  05/27/22 116/62  02/28/22 116/72  -Controlled -Current treatment: amlodipine '5mg'$  Appropriate, Query effective,  Losartan-hydrochlorothiazide 100/25 mg daily Appropriate, Query effective,  Metoprolol succinate 200 mg daily Appropriate, Query effective,  -Medications previously tried: none reported  -Current home readings:  28 December 2021: (States tends to be higher in AM before she  takes her meds) 119/69 113/62 125/65 126/66 136/76 140/79 137/79 01/14/22: 152/82 01/15/22: 162/87, 127/77 August 2023 02/11/22: 127/74 02/10/22: 139/80 02/09/22: 126/76 02/08/22: 128/72 02/07/22: 139/77 Feb 2024: 165/79 141/71 169/91 167/(?) March 2024: -146/76 -143/76 -151/77 -129/69 -151/87 -154/86 -132/76 -135/77 -Wasn't taking whole Amlodipine. Will start -Current dietary habits: eats breakfast and supper mainly.  -Current exercise habits: Was previously in karate and exercise class. She hopes to rejoin the class soon. Had stopped prior to [place].  -Affirmshypotensive/hypertensive symptoms -Educated on BP goals and benefits of medications for prevention of heart attack, stroke and kidney damage; Daily salt intake goal < 2300 mg; Exercise goal of 150 minutes per week; Importance of home blood pressure monitoring; July 2023: BP is elevated. However, patient states she becomes dizzy at a BP around 123456 systolic. Patient is constantly taking half a pill or skipping doses to prevent herself from becoming dizzy (She had a sever fall last month). She is taking Metoprolol and Losartan at night and Amlodipine in the AM. Counseled to take Amlodipine at night to prevent hypotension during the day and because her BP tends to be fine during the day but will be too high in AM when she tests her BP first  thing upon waking/before pills.Will have patient write BP daily and write how/when she took meds and f/u next month. Will also let PCP know August 2023: Switched to taking Amlodpine in PM and BP after meds, readings look great! Feb 2024: BP is elevated again. Spoke with patient, she thinks it's because she hasn't been eating well and is in pain. She'd like a week to see if she can get them back down to normal. Will have Danielle call in a week March 2024: BP elevated but she still hasn't started Amlodipine '5mg'$ . Wanted to wait to talk with me. Will have team reach out in a few weeks to get  BP readings   Hyperlipidemia: (LDL goal < 100) The 10-year ASCVD risk score (Arnett DK, et al., 2019) is: 29.6%   Values used to calculate the score:     Age: 75 years     Sex: Female     Is Non-Hispanic African American: Yes     Diabetic: Yes     Tobacco smoker: No     Systolic Blood Pressure: 123456 mmHg     Is BP treated: Yes     HDL Cholesterol: 60 mg/dL     Total Cholesterol: 180 mg/dL Lab Results  Component Value Date   CHOL 180 06/10/2022   CHOL 172 02/28/2022   CHOL 159 11/19/2021   Lab Results  Component Value Date   HDL 60 06/10/2022   HDL 48 02/28/2022   HDL 53 11/19/2021   Lab Results  Component Value Date   LDLCALC 105 (H) 06/10/2022   LDLCALC 96 02/28/2022   LDLCALC 86 11/19/2021   Lab Results  Component Value Date   TRIG 84 06/10/2022   TRIG 158 (H) 02/28/2022   TRIG 108 11/19/2021   Lab Results  Component Value Date   CHOLHDL 3.0 06/10/2022   CHOLHDL 3.6 02/28/2022   CHOLHDL 3.0 11/19/2021   No results found for: "LDLDIRECT" Last vitamin D No results found for: "25OHVITD2", "25OHVITD3", "VD25OH" Lab Results  Component Value Date   TSH 1.370 11/19/2021  -Controlled -Current treatment: aspirin ec 81 mg daily Appropriate, Effective, Safe, Accessible Ezetimibe '10mg'$  Appropriate, Effective, Safe, Accessible -Medications previously tried: brand name Crestor, atorvastatin -Current dietary patterns: reports breakfast meat and hot dogs  -Current exercise habits: was previously involved in karate and exercise class prior to [place] -Educated on Cholesterol goals;  Benefits of statin for ASCVD risk reduction; Importance of limiting foods high in cholesterol; Exercise goal of 150 minutes per week; Jan 2023: Recommended considering Crestor 10 mg three times weekly. Patient reports she can tolerate brand name Crestor but the generic leaves her achy. Pharmacist to request Dispense as Written for brand name Crestor.  Assessed patient finances. Patient approved  for [employer] to cover cost of Crestor.  July 2023: Patient states she stopped Rosuvastatin once she started Ezetimibe. Still on medslist, will ask PCP about removing. August 2023: PCP removed statin from medslist   Diabetes (A1c goal <8%) Lab Results  Component Value Date   HGBA1C 7.6 (H) 06/10/2022   HGBA1C 7.4 (H) 02/28/2022   HGBA1C 7.2 (H) 11/19/2021   Lab Results  Component Value Date   MICROALBUR 10 12/25/2020   LDLCALC 105 (H) 06/10/2022   CREATININE 0.88 06/10/2022    Lab Results  Component Value Date   NA 140 06/10/2022   K 3.7 06/10/2022   CREATININE 0.88 06/10/2022   EGFR 68 06/10/2022   GFRNONAA >60 09/14/2008   GLUCOSE 109 (H) 06/10/2022  Lab Results  Component Value Date   WBC 5.2 06/10/2022   HGB 13.0 06/10/2022   HCT 38.8 06/10/2022   MCV 90 06/10/2022   PLT 323 06/10/2022    Lab Results  Component Value Date   LABMICR 5.6 08/06/2021   MICROALBUR 10 12/25/2020   -Controlled -Current medications: Metformin '850mg'$  twice daily Appropriate, Effective, Safe, Accessible Ozempic 0.'25mg'$  Appropriate, Query effective,  Feb 2024: Patient never picked up. Still taking Metformin March 2024: Still hasn't submitted One touch verio test strips twice daily  Lancets daily prior to meal  -Medications previously tried: Metformin '500mg'$  3/day (ADR's) -Current home glucose readings fasting glucose:  May 2022: 120, 121, 136, 156, 124, 128, 116, 128, 164 (birthday cake), 143, 168 (birthday cake) Jan 2023: Didn't have readings on her July 2023: Didn't have readings on her August 2023: 02/11/22: 132 02/09/22: 159 @ 0800, 206 @ 2130, 206 @ 2200 02/08/22: 142 @ 1100, 234 @ 1800,  02/07/22: 142 @ 0830, 171 @ 1900 Feb 2024: 08/12/22: 127 - (193 Post food) - 165 08/11/22: 138, 116 08/10/22: 124, 144 March 2024: Hasn't started new Metformin dose -Denies hypoglycemic/hyperglycemic symptoms -Current meal patterns:  Late breakfast or early lunch: egg, bacon or  sausage with grits or pancake occasionally (a few times a month). [place] Clap Croissant once a week as a treat.   dinner: dines out mainly.  snacks:  more birthday cake lately since celebrating all month long),  drinks: unsweet tea, coffee  -Current exercise: limited since not attending exercise class after COVID -Educated on A1c and blood sugar goals; Complications of diabetes including kidney damage, retinal damage, and cardiovascular disease; Exercise goal of 150 minutes per week; Benefits of routine self-monitoring of blood sugar; Carbohydrate counting and/or plate method -Counseled to check feet daily and get yearly eye exams -Counseled on diet and exercise extensively Recommended to continue current medication Jan 2023: Patient doesn't want to increase Metformin to 3/day nor does she want to start Ozempic. Updated medslist and will let PCP know August 2023: Sugars looking better. Counseled to test sugars before eating, not after Feb 2024: Patient prescribed Xigduo a few months ago but was too expensive so she didn't start. Does have PAP but she had concerns about it. She told me, "Can we do PAP for Ozempic instead? I was given that before and I've never felt better but someone told me there was no PAP." I explained to her there was a shortage last year but we're able to do PAP now. Will ask Rekha if we can change Xigduo to Ozempic + Metformin per patient's wishes. Sent direct msg to PCP March 2024:  -Patient hasn't started new Metformin dose. Counseled to start -Ozempic: Patient hasn't returned PAP because she was concerned about them looking into her finances. She agreed to submit after we spoke about it      Disc Degeneration (Goal: manage pain) -Pain Scale Jan 2023: Patient didn't specify, only said she is very content August 2023: Didn't specify pain scale, just that she feels much better Feb 2024: Didn't specify pain scale. Stated had Shoulder Sx last month but feels much  better -Controlled -Current treatment  Tramadol 50 mg bid prn Appropriate, Effective, Safe, Accessible Meloxicam '15mg'$  QD PRN Appropriate, Effective, Safe, Accessible Acetaminophen Appropriate, Effective, Safe, Accessible -Medications previously tried: IBU July 2023: IBU was on patients meds list and Meloxicam wasn't. Patient states she has both but that she doesn't take both. Asked her which she prefers and that we should stick  with one. She called back and said she prefers Meloxicam. Updated Medslist and once again counseled not to take together August 2023: Patient very happy with PT, states is helping pain greatly Feb 2024: Counseled once again to not take IBU. She states she hasn't. She asked me about Tylenol and I explained she could take Acetaminophen/Tylenol but to get the generic and spent time counseling on 3g/day MDD   CPP F/U April 2024  [place] Constable, Pharm.D. - [phone]

## 2022-09-09 NOTE — Addendum Note (Signed)
Addended by: Philipp Ovens L on: 09/09/2022 03:51 PM   Modules accepted: Level of Service

## 2022-09-10 ENCOUNTER — Ambulatory Visit: Payer: Medicare Other

## 2022-09-10 DIAGNOSIS — G4733 Obstructive sleep apnea (adult) (pediatric): Secondary | ICD-10-CM | POA: Diagnosis not present

## 2022-09-12 DIAGNOSIS — M25511 Pain in right shoulder: Secondary | ICD-10-CM | POA: Diagnosis not present

## 2022-09-12 DIAGNOSIS — M6281 Muscle weakness (generalized): Secondary | ICD-10-CM | POA: Diagnosis not present

## 2022-09-16 ENCOUNTER — Telehealth: Payer: Self-pay

## 2022-09-16 NOTE — Telephone Encounter (Signed)
-----   Message from [name], MD sent at 09/12/2022 10:48 PM EDT ----- Regarding: appt in 11/2022 appt in 11/2022:  Please schedule with me instead of Loletha Grayer. She is very difficult. Dr. Tobie Poet

## 2022-09-16 NOTE — Telephone Encounter (Signed)
I spoke with Mindy Ingram. Being that Mindy Ingram would be a new provider to our office. Dr. Tobie Poet felt that her upcoming appointment in June would need to be with her (Dr. Tobie Poet) VS Mindy Ingram. Patient notified. Appointment has been changed to Dr. Tobie Poet in June.

## 2022-09-23 ENCOUNTER — Telehealth: Payer: Self-pay | Admitting: Internal Medicine

## 2022-09-23 NOTE — Telephone Encounter (Signed)
Advised patient I would call back once sleep study results are available. She verbalized understanding. NFN

## 2022-09-23 NOTE — Telephone Encounter (Signed)
Dr. Annamaria Boots can you please advise on HST results?

## 2022-09-23 NOTE — Telephone Encounter (Signed)
Patient would like results of home sleep test. Patient phone number is [phone].

## 2022-09-23 NOTE — Telephone Encounter (Signed)
Results of sleep study not yet available. Please check with [place] next week.

## 2022-09-24 DIAGNOSIS — G4733 Obstructive sleep apnea (adult) (pediatric): Secondary | ICD-10-CM | POA: Diagnosis not present

## 2022-09-24 DIAGNOSIS — M6281 Muscle weakness (generalized): Secondary | ICD-10-CM | POA: Diagnosis not present

## 2022-09-24 DIAGNOSIS — M25511 Pain in right shoulder: Secondary | ICD-10-CM | POA: Diagnosis not present

## 2022-09-27 ENCOUNTER — Other Ambulatory Visit: Payer: Self-pay | Admitting: Family Medicine

## 2022-10-01 ENCOUNTER — Telehealth: Payer: Self-pay | Admitting: Internal Medicine

## 2022-10-01 NOTE — Telephone Encounter (Signed)
Her home sleep test showed mild to moderate sleep apnea, averaging 14 apneas/ hour. I would advise her to continue her CPAP at current settings.

## 2022-10-01 NOTE — Telephone Encounter (Signed)
Dr. Young can you please advise on HST results? 

## 2022-10-02 NOTE — Telephone Encounter (Signed)
Went over sleep study results with patient. She verbalized understanding. NFN

## 2022-10-03 ENCOUNTER — Other Ambulatory Visit: Payer: Self-pay

## 2022-10-06 NOTE — Progress Notes (Cosign Needed)
10/06/22-Called pt today and she confirmed she is taking a half of Amlodipine when her BP are lower and then if its higher she takes a whole pill. She was advised to still take a whole pill at night and pt understood and will start. She admitted she knows what she is supposed to do and will start after this call.   Recent BP in AM and PM 09/29/22 136/74, 127/73 09/30/22 142/75, 122/72 10/01/22 130/71 10/03/22 134/68 10/04/22 112/64, 139/77   Mindy Ingram, CMA Clinical Pharmacist Assistant  [phone]

## 2022-10-06 NOTE — Progress Notes (Cosign Needed)
I advised pt I will call back in a few weeks after starting to take a whole pill of Amlodipine and check BP.  Roxana Hires, CMA Clinical Pharmacist Assistant  [phone]

## 2022-10-07 ENCOUNTER — Encounter: Payer: Self-pay | Admitting: Cardiology

## 2022-10-07 ENCOUNTER — Ambulatory Visit: Payer: Medicare Other | Attending: Cardiology | Admitting: Cardiology

## 2022-10-07 VITALS — BP 118/72 | HR 72 | Ht 59.6 in | Wt 147.8 lb

## 2022-10-07 DIAGNOSIS — G4733 Obstructive sleep apnea (adult) (pediatric): Secondary | ICD-10-CM | POA: Diagnosis not present

## 2022-10-07 DIAGNOSIS — G720 Drug-induced myopathy: Secondary | ICD-10-CM

## 2022-10-07 DIAGNOSIS — I152 Hypertension secondary to endocrine disorders: Secondary | ICD-10-CM | POA: Diagnosis not present

## 2022-10-07 DIAGNOSIS — T466X5A Adverse effect of antihyperlipidemic and antiarteriosclerotic drugs, initial encounter: Secondary | ICD-10-CM | POA: Diagnosis not present

## 2022-10-07 DIAGNOSIS — R0609 Other forms of dyspnea: Secondary | ICD-10-CM | POA: Diagnosis not present

## 2022-10-07 DIAGNOSIS — I10 Essential (primary) hypertension: Secondary | ICD-10-CM | POA: Diagnosis not present

## 2022-10-07 DIAGNOSIS — E1159 Type 2 diabetes mellitus with other circulatory complications: Secondary | ICD-10-CM | POA: Diagnosis not present

## 2022-10-07 DIAGNOSIS — E782 Mixed hyperlipidemia: Secondary | ICD-10-CM | POA: Diagnosis not present

## 2022-10-07 DIAGNOSIS — E1169 Type 2 diabetes mellitus with other specified complication: Secondary | ICD-10-CM

## 2022-10-07 NOTE — Progress Notes (Signed)
Cardiology Office Note:    Date:  10/07/2022   ID:  Mindy Ingram, DOB 10-01-1945, MRN [id number]  PCP:  Blane Ohara, MD  Cardiologist:  Garwin Brothers, MD   Referring MD: Blane Ohara, MD    ASSESSMENT:    1. DOE (dyspnea on exertion)   2. Essential hypertension   3. Hypertension associated with diabetes   4. OSA (obstructive sleep apnea)   5. DM type 2 with diabetic mixed hyperlipidemia   6. Statin myopathy   7. Mixed hyperlipidemia    PLAN:    In order of problems listed above:  Dyspnea on exertion: Primary prevention stressed to the patient.  Importance of compliance with diet medication stressed and she vocalized understanding.  Will do a Lexiscan sestamibi to assess her concerns. Cardiac murmur: Echocardiogram will be done to assess murmur heard on auscultation. Mixed dyslipidemia: On lipid-lowering medications followed by primary care.  Diet emphasized.  She is going to do better with diet and exercise.  She is not keen on any medical therapy at this time.  Lipids are not at goal.  Goal LDL must be less than 60 in view of diabetes. Essential hypertension: Blood pressure stable and dialysis emphasized.  Lifestyle modification urged. Patient will be seen in follow-up appointment in 6 months or earlier if the patient has any concerns.    Medication Adjustments/Labs and Tests Ordered: Current medicines are reviewed at length with the patient today.  Concerns regarding medicines are outlined above.  Orders Placed This Encounter  Procedures   MYOCARDIAL PERFUSION IMAGING   ECHOCARDIOGRAM COMPLETE   No orders of the defined types were placed in this encounter.    No chief complaint on file.    History of Present Illness:    Mindy Ingram is a 77 y.o. female.  Patient has past medical history of essential hypertension, mixed dyslipidemia, diabetes mellitus and obesity.  She has history of sleep apnea.  She denies any problems at this time.  She tells me  that she has some dyspnea on exertion.  She is not on statin therapy because of history of statin myopathy.  At the time of my evaluation, the patient is alert awake oriented and in no distress.  She wants to evaluated for dyspnea on exertion.  This has been steady but she has not been able to do much.  Overall she leads a sedentary lifestyle.  Past Medical History:  Diagnosis Date   Allergic asthma, mild intermittent, uncomplicated 10/17/2007   Office Spirometry 04/03/2015- WNL FVC 2.14/110%, FEV1 1.94/128%, FEV1/FVC 0.91  Office spirometry- 11/04/16--by allergy office- FEV1/FVC 0.77 WNL   Bilateral impacted cerumen 07/01/2018   BMI 29.0-29.9,adult 06/10/2022   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   Cervical radicular pain 01/20/2022   Chronic back pain    Contusion of head, initial encounter 03/18/2021   Corn of toe 06/25/2020   Degeneration of lumbosacral intervertebral disc 01/26/2012   DJD (degenerative joint disease)    DM type 2 with diabetic mixed hyperlipidemia 04/08/2021   Dyslipidemia 01/23/2015   Essential hypertension 01/23/2015   Fibromyalgia    GERD 10/17/2007   Qualifier: Diagnosis of  By: Maple Hudson MD, Clinton D    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   Hyperlipidemia 06/25/2020   Hypertension associated with diabetes 01/23/2015   Lumbar facet arthropathy 01/20/2022   Lumbar spondylolysis 02/10/2012   Menopause 06/25/2020   Metatarsalgia of both feet 09/18/2016   Multiple benign melanocytic nevi 06/25/2020   Myalgia  03/03/2022   Myalgia due to statin 03/03/2022   Neck pain 12/12/2021   Obesity 07/10/2021   OSA (obstructive sleep apnea)    cpap setting of 10   Pain 03/24/2018   Preop cardiovascular exam 06/10/2022   Right rotator cuff tear 06/25/2020   Right temporal frontal scalp contusions 03/18/2021   S/P lumbar fusion 03/16/2012   Seasonal and perennial allergic rhinitis 10/17/2007   Allergy vaccine 2002- dc'd   Shingles    Shoulder joint pain  06/25/2020   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   Statin myopathy 06/10/2022   Thoracic spine pain 02/23/2013   Type II or unspecified type diabetes mellitus without mention of complication, not stated as uncontrolled     Past Surgical History:  Procedure Laterality Date   APPENDECTOMY  1995   BACK SURGERY  2013   lower back with plates and screws   benign lump right axilla     BILATERAL SALPINGOOPHORECTOMY  1995   CERVICAL SPINE SURGERY  1992   at [facility], slight limitation with turning neck   CHOLECYSTECTOMY  1995   EUS N/A 07/21/2013   Procedure: UPPER ENDOSCOPIC ULTRASOUND (EUS) LINEAR;  Surgeon: Rachael Feeaniel P Jacobs, MD;  Location: [facility];  Service: Endoscopy;  Laterality: N/A;   FOOT SURGERY     2007 - right great toe, 2000 - right fifth digit .   NEUROPLASTY / TRANSPOSITION MEDIAN NERVE AT CARPAL TUNNEL BILATERAL  1995   PARTIAL COLECTOMY  1995   benign adhesions   PARTIAL HYSTERECTOMY  1980s.   abdominal   right foot surgery  2000   right great toe with artificial bone inserted   right knee arthroscopy Right 02/2017    Current Medications: Current Meds  Medication Sig   acyclovir (ZOVIRAX) 400 MG tablet Take 1 tablet (400 mg total) by mouth 2 (two) times daily as needed (outbreak).   albuterol (VENTOLIN HFA) 108 (90 Base) MCG/ACT inhaler Inhale 2 puffs into the lungs every 6 (six) hours as needed for wheezing or shortness of breath.   amLODipine (NORVASC) 5 MG tablet Take 5 mg by mouth daily.   aspirin EC 81 MG tablet Take 81 mg by mouth daily.   azelastine (ASTELIN) 0.1 % nasal spray Place 1 spray into both nostrils 2 (two) times daily as needed for allergies or rhinitis.   Blood Glucose Monitoring Suppl (ONE TOUCH ULTRA 2) w/Device KIT Use as instructed five times daily. E11.69   cetirizine (ZYRTEC) 10 MG tablet Take 1 tablet (10 mg total) by mouth daily as needed for allergies.   Cholecalciferol (VITAMIN D PO) Take 2,000 Units  by mouth daily.    Elderberry 575 MG/5ML SYRP Take 5 mLs by mouth daily.   ezetimibe (ZETIA) 10 MG tablet TAKE 1 TABLET BY MOUTH DAILY   fluticasone (FLONASE) 50 MCG/ACT nasal spray USE 1 SPRAY IN EACH NOSTRIL EVERY DAY (Patient taking differently: Place 1 spray into both nostrils 2 (two) times daily.)   Glucose Blood (BLOOD GLUCOSE TEST STRIPS 333) STRP 100 each by In Vitro route 3 (three) times daily before meals.   Lancets 30G MISC 3 Boxes by Does not apply route every 4 (four) hours as needed.   losartan-hydrochlorothiazide (HYZAAR) 100-25 MG tablet TAKE 1 TABLET BY MOUTH DAILY   meloxicam (MOBIC) 7.5 MG tablet Take 7.5 mg by mouth daily as needed for pain.   metFORMIN (GLUCOPHAGE) 850 MG tablet Take 850 mg by mouth 2 (two) times daily with a meal.  methocarbamol (ROBAXIN) 500 MG tablet Take 1 tablet (500 mg total) by mouth 2 (two) times daily.   metoprolol (TOPROL-XL) 200 MG 24 hr tablet TAKE 1 TABLET(200 MG) BY MOUTH DAILY   Multiple Vitamins-Minerals (COMPLETE ENERGY) TABS Take 1 tablet by mouth daily.   nitroGLYCERIN (NITROSTAT) 0.4 MG SL tablet Place 1 tablet (0.4 mg total) under the tongue every 5 (five) minutes as needed for chest pain.   Omega-3 Fatty Acids (FISH OIL) 1000 MG CAPS Take 1 capsule by mouth daily.   omeprazole (PRILOSEC) 20 MG capsule TAKE 1 CAPSULE BY MOUTH DAILY   traMADol (ULTRAM) 50 MG tablet Take 50 mg by mouth 2 (two) times daily as needed for pain.   triamcinolone ointment (KENALOG) 0.1 % Apply 1 application topically 2 (two) times daily.   Turmeric (QC TUMERIC COMPLEX PO) Take 1,000 mg by mouth daily. Every other day   Current Facility-Administered Medications for the 10/07/22 encounter (Office Visit) with Leighanna Kirn, Aundra Dubin, MD  Medication   triamcinolone acetonide (KENALOG-40) injection 60 mg     Allergies:   Dairy aid [tilactase], Lactose, Atorvastatin, Hydrocodone-acetaminophen, Hydrocodone-acetaminophen, Milk-related compounds, Mirabegron, Oxycontin  [oxycodone hcl], Prochlorperazine edisylate, Rosuvastatin, Shellfish allergy, Umeclidinium-vilanterol, and Percodan [oxycodone-aspirin]   Social History   Socioeconomic History   Marital status: Widowed    Spouse name: Not on file   Number of children: 1   Years of education: Not on file   Highest education level: Not on file  Occupational History   Occupation: Retired  Tobacco Use   Smoking status: Never   Smokeless tobacco: Never  Vaping Use   Vaping Use: Never used  Substance and Sexual Activity   Alcohol use: No   Drug use: No   Sexual activity: Not on file  Other Topics Concern   Not on file  Social History Narrative   Not on file   Social Determinants of Health   Financial Resource Strain: High Risk (08/13/2022)   Overall Financial Resource Strain (CARDIA)    Difficulty of Paying Living Expenses: Hard  Food Insecurity: No Food Insecurity (05/26/2022)   Hunger Vital Sign    Worried About Running Out of Food in the Last Year: Never true    Ran Out of Food in the Last Year: Never true  Transportation Needs: No Transportation Needs (08/13/2022)   PRAPARE - Administrator, Civil Service (Medical): No    Lack of Transportation (Non-Medical): No  Physical Activity: Insufficiently Active (05/26/2022)   Exercise Vital Sign    Days of Exercise per Week: 2 days    Minutes of Exercise per Session: 40 min  Stress: No Stress Concern Present (05/26/2022)   [employer] of Occupational Health - Occupational Stress Questionnaire    Feeling of Stress : Not at all  Social Connections: Moderately Isolated (05/26/2022)   Social Connection and Isolation Panel [NHANES]    Frequency of Communication with Friends and Family: More than three times a week    Frequency of Social Gatherings with Friends and Family: More than three times a week    Attends Religious Services: More than 4 times per year    Active Member of [employer] or Organizations: No    Attends Tax inspector Meetings: Never    Marital Status: Widowed     Family History: The patient's family history includes Breast cancer in her maternal aunt; Heart attack in her sister; Heart disease in her mother; Prostate cancer in her father and paternal uncle. There is no  history of Allergies, Asthma, Eczema, or Immunodeficiency.  ROS:   Please see the history of present illness.    All other systems reviewed and are negative.  EKGs/Labs/Other Studies Reviewed:    The following studies were reviewed today: I discussed my findings with the patient at length.  EKG from primary care was reviewed.   Recent Labs: 11/19/2021: TSH 1.370 02/28/2022: Magnesium 1.8 06/10/2022: ALT 15; BUN 17; Creatinine, Ser 0.88; Hemoglobin 13.0; Platelets 323; Potassium 3.7; Sodium 140  Recent Lipid Panel    Component Value Date/Time   CHOL 180 06/10/2022 1122   TRIG 84 06/10/2022 1122   HDL 60 06/10/2022 1122   CHOLHDL 3.0 06/10/2022 1122   LDLCALC 105 (H) 06/10/2022 1122    Physical Exam:    VS:  BP 118/72   Pulse 72   Ht 4' 11.6" (1.514 m)   Wt 147 lb 12.8 oz (67 kg)   SpO2 96%   BMI 29.25 kg/m     Wt Readings from Last 3 Encounters:  10/07/22 147 lb 12.8 oz (67 kg)  06/10/22 148 lb 8 oz (67.4 kg)  05/27/22 150 lb 3.2 oz (68.1 kg)     GEN: Patient is in no acute distress HEENT: Normal NECK: No JVD; No carotid bruits LYMPHATICS: No lymphadenopathy CARDIAC: Hear sounds regular, 2/6 systolic murmur at the apex. RESPIRATORY:  Clear to auscultation without rales, wheezing or rhonchi  ABDOMEN: Soft, non-tender, non-distended MUSCULOSKELETAL:  No edema; No deformity  SKIN: Warm and dry NEUROLOGIC:  Alert and oriented x 3 PSYCHIATRIC:  Normal affect   Signed, Garwin Brothers, MD  10/07/2022 1:48 PM    [facility]

## 2022-10-07 NOTE — Patient Instructions (Signed)
Medication Instructions:  Your physician recommends that you continue on your current medications as directed. Please refer to the Current Medication list given to you today.  *If you need a refill on your cardiac medications before your next appointment, please call your pharmacy*   Lab Work: None ordered If you have labs (blood work) drawn today and your tests are completely normal, you will receive your results only by: MyChart Message (if you have MyChart) OR A paper copy in the mail If you have any lab test that is abnormal or we need to change your treatment, we will call you to review the results.   Testing/Procedures: You are scheduled for a Myocardial Perfusion Imaging Study.  Please arrive 15 minutes prior to your appointment time for registration and insurance purposes.  The test will take approximately 3 to 4 hours to complete; you may bring reading material.  If someone comes with you to your appointment, they will need to remain in the main lobby due to limited space in the testing area.   How to prepare for your Myocardial Perfusion Test: Do not eat or drink 3 hours prior to your test, except you may have water. Do not consume products containing caffeine (regular or decaffeinated) 12 hours prior to your test. (ex: coffee, chocolate, sodas, tea). Do bring a list of your current medications with you.  If not listed below, you may take your medications as normal. Do wear comfortable clothes (no dresses or overalls) and walking shoes, tennis shoes preferred (No heels or open toe shoes are allowed). Do NOT wear cologne, perfume, aftershave, or lotions (deodorant is allowed). If these instructions are not followed, your test will have to be rescheduled.  If you cannot keep your appointment, please provide 24 hours notification to the Nuclear [facility], to avoid a possible $50 charge to your account.   Your physician has requested that you have an echocardiogram. Echocardiography is  a painless test that uses sound waves to create images of your heart. It provides your doctor with information about the size and shape of your heart and how well your heart's chambers and valves are working. This procedure takes approximately one hour. There are no restrictions for this procedure. Please do NOT wear cologne, perfume, aftershave, or lotions (deodorant is allowed). Please arrive 15 minutes prior to your appointment time.   Follow-Up: At [facility], you and your health needs are our priority.  As part of our continuing mission to provide you with exceptional heart care, we have created designated Provider Care Teams.  These Care Teams include your primary Cardiologist (physician) and Advanced Practice Providers (APPs -  Physician Assistants and Nurse Practitioners) who all work together to provide you with the care you need, when you need it.  We recommend signing up for the patient portal called "MyChart".  Sign up information is provided on this After Visit Summary.  MyChart is used to connect with patients for Virtual Visits (Telemedicine).  Patients are able to view lab/test results, encounter notes, upcoming appointments, etc.  Non-urgent messages can be sent to your provider as well.   To learn more about what you can do with MyChart, go to [email or web address].    Your next appointment:   12 month(s)  Provider:   Belva Crome, MD   Other Instructions  Cardiac Nuclear Scan A cardiac nuclear scan is a test that is done to check the flow of blood to your heart. It is done when you are resting and  when you are exercising. The test looks for problems such as: Not enough blood reaching a portion of the heart. The heart muscle not working as it should. You may need this test if you have: Heart disease. Lab results that are not normal. Had heart surgery or a balloon procedure to open up blocked arteries (angioplasty) or a small mesh tube (stent). Chest  pain. Shortness of breath. Had a heart attack. In this test, a special dye (tracer) is put into your bloodstream. The tracer will travel to your heart. A camera will then take pictures of your heart to see how the tracer moves through your heart. This test is usually done at a hospital and takes 2-4 hours. Tell a doctor about: Any allergies you have. All medicines you are taking, including vitamins, herbs, eye drops, creams, and over-the-counter medicines. Any bleeding problems you have. Any surgeries you have had. Any medical conditions you have. Whether you are pregnant or may be pregnant. Any history of asthma or long-term (chronic) lung disease. Any history of heart rhythm disorders or heart valve conditions. What are the risks? Your doctor will talk with you about risks. These may include: Serious chest pain and heart attack. This is only a risk if the stress portion of the test is done. Fast or uneven heartbeats (palpitations). A feeling of warmth in your chest. This feeling usually does not last long. Allergic reaction to the tracer. Shortness of breath or trouble breathing. What happens before the test? Ask your doctor about changing or stopping your normal medicines. Follow instructions from your doctor about what you cannot eat or drink. Remove your jewelry on the day of the test. Ask your doctor if you need to avoid nicotine or caffeine. What happens during the test? An IV tube will be inserted into one of your veins. Your doctor will give you a small amount of tracer through the IV tube. You will wait for 20-40 minutes while the tracer moves through your bloodstream. Your heart will be monitored with an electrocardiogram (ECG). You will lie down on an exam table. Pictures of your heart will be taken for about 15-20 minutes. You may also have a stress test. For this test, one of these things may be done: You will be asked to exercise on a treadmill or a stationary  bike. You will be given medicines that will make your heart work harder. This is done if you are unable to exercise. When blood flow to your heart has peaked, a tracer will again be given through the IV tube. After 20-40 minutes, you will get back on the exam table. More pictures will be taken of your heart. Depending on the tracer that is used, more pictures may need to be taken 3-4 hours later. Your IV tube will be removed when the test is over. The test may vary among doctors and hospitals. What happens after the test? Ask your doctor: Whether you can return to your normal schedule, including diet, activities, travel, and medicines. Whether you should drink more fluids. This will help to remove the tracer from your body. Ask your doctor, or the department that is doing the test: When will my results be ready? How will I get my results? What are my treatment options? What other tests do I need? What are my next steps? This information is not intended to replace advice given to you by your health care provider. Make sure you discuss any questions you have with your health care provider.   Document Revised: 11/12/2021 Document Reviewed: 11/12/2021 Elsevier Patient Education  2023 Elsevier Inc.  Echocardiogram An echocardiogram is a test that uses sound waves (ultrasound) to produce images of the heart. Images from an echocardiogram can provide important information about: Heart size and shape. The size and thickness and movement of your heart's walls. Heart muscle function and strength. Heart valve function or if you have stenosis. Stenosis is when the heart valves are too narrow. If blood is flowing backward through the heart valves (regurgitation). A tumor or infectious growth around the heart valves. Areas of heart muscle that are not working well because of poor blood flow or injury from a heart attack. Aneurysm detection. An aneurysm is a weak or damaged part of an artery wall. The  wall bulges out from the normal force of blood pumping through the body. Tell a health care provider about: Any allergies you have. All medicines you are taking, including vitamins, herbs, eye drops, creams, and over-the-counter medicines. Any blood disorders you have. Any surgeries you have had. Any medical conditions you have. Whether you are pregnant or may be pregnant. What are the risks? Generally, this is a safe test. However, problems may occur, including an allergic reaction to dye (contrast) that may be used during the test. What happens before the test? No specific preparation is needed. You may eat and drink normally. What happens during the test?  You will take off your clothes from the waist up and put on a hospital gown. Electrodes or electrocardiogram (ECG)patches may be placed on your chest. The electrodes or patches are then connected to a device that monitors your heart rate and rhythm. You will lie down on a table for an ultrasound exam. A gel will be applied to your chest to help sound waves pass through your skin. A handheld device, called a transducer, will be pressed against your chest and moved over your heart. The transducer produces sound waves that travel to your heart and bounce back (or "echo" back) to the transducer. These sound waves will be captured in real-time and changed into images of your heart that can be viewed on a video monitor. The images will be recorded on a computer and reviewed by your health care provider. You may be asked to change positions or hold your breath for a short time. This makes it easier to get different views or better views of your heart. In some cases, you may receive contrast through an IV in one of your veins. This can improve the quality of the pictures from your heart. The procedure may vary among health care providers and hospitals. What can I expect after the test? You may return to your normal, everyday life, including diet,  activities, and medicines, unless your health care provider tells you not to do that. Follow these instructions at home: It is up to you to get the results of your test. Ask your health care provider, or the department that is doing the test, when your results will be ready. Keep all follow-up visits. This is important. Summary An echocardiogram is a test that uses sound waves (ultrasound) to produce images of the heart. Images from an echocardiogram can provide important information about the size and shape of your heart, heart muscle function, heart valve function, and other possible heart problems. You do not need to do anything to prepare before this test. You may eat and drink normally. After the echocardiogram is completed, you may return to your normal, everyday life,   unless your health care provider tells you not to do that. This information is not intended to replace advice given to you by your health care provider. Make sure you discuss any questions you have with your health care provider. Document Revised: 02/27/2021 Document Reviewed: 02/07/2020 Elsevier Patient Education  2023 Elsevier Inc.    

## 2022-10-09 DIAGNOSIS — M6281 Muscle weakness (generalized): Secondary | ICD-10-CM | POA: Diagnosis not present

## 2022-10-09 DIAGNOSIS — M25511 Pain in right shoulder: Secondary | ICD-10-CM | POA: Diagnosis not present

## 2022-10-15 ENCOUNTER — Telehealth (HOSPITAL_COMMUNITY): Payer: Self-pay | Admitting: *Deleted

## 2022-10-15 DIAGNOSIS — M5136 Other intervertebral disc degeneration, lumbar region: Secondary | ICD-10-CM | POA: Diagnosis not present

## 2022-10-15 DIAGNOSIS — M797 Fibromyalgia: Secondary | ICD-10-CM | POA: Diagnosis not present

## 2022-10-15 DIAGNOSIS — R768 Other specified abnormal immunological findings in serum: Secondary | ICD-10-CM | POA: Diagnosis not present

## 2022-10-15 DIAGNOSIS — M1991 Primary osteoarthritis, unspecified site: Secondary | ICD-10-CM | POA: Diagnosis not present

## 2022-10-15 DIAGNOSIS — M7711 Lateral epicondylitis, right elbow: Secondary | ICD-10-CM | POA: Diagnosis not present

## 2022-10-15 NOTE — Telephone Encounter (Signed)
Left message on voicemail per DPR in reference to upcoming appointment scheduled on 10/22/2022 at 11:30 with detailed instructions given per Myocardial Perfusion Study Information Sheet for the test. LM to arrive 15 minutes early, and that it is imperative to arrive on time for appointment to keep from having the test rescheduled. If you need to cancel or reschedule your appointment, please call the office within 24 hours of your appointment. Failure to do so may result in a cancellation of your appointment, and a $50 no show fee. Phone number given for call back for any questions.

## 2022-10-21 ENCOUNTER — Telehealth: Payer: Self-pay

## 2022-10-21 ENCOUNTER — Telehealth: Payer: Self-pay | Admitting: Cardiology

## 2022-10-21 NOTE — Telephone Encounter (Signed)
Patient was wondering if one of the test that is scheduled for tomorrow has in increased risk of causing a heart attack. Patient also wanted to know if she should take her MetFormin, allergy medication, and vitamins tomorrow morning before her test. Patient also stated if you can not reach her by calling her home phone to please call her mobile number as well. Please advise.

## 2022-10-21 NOTE — Progress Notes (Signed)
Care Management & Coordination Services Pharmacy Team  Reason for Encounter: Appointment Reminder  Contacted patient to confirm telephone appointment with Artelia Laroche, PharmD on 10/23/22 at 11:30 am.  Unsuccessful outreach. Left voicemail for patient to return call.   Chart review:  Recent office visits:  None  Recent consult visits:  10/15/22 (Rheumatology) Progress notes. No med changes  10/07/22 (Cardiology) Belva Crome MD. Seen for DOE. Changed Metformin to  two times daily.   Hospital visits:  None   Star Rating Drugs:  Medication:  Last Fill: Day Supply Losartan   09/04/22-06/01/22 100ds Metformin ER  09/25/22 100ds Semaglutide   Care Gaps: Annual wellness visit in last year? Yes  If Diabetic: Last eye exam / retinopathy screening:01/21/22 Last diabetic foot exam:06/10/22   Roxana Hires, [facility] Clinical Pharmacist Assistant  [phone]

## 2022-10-21 NOTE — Telephone Encounter (Signed)
Spoke with pt about Myocardial Profusion testing tomorrow. Discussed testing procedure, medications and answered pts questions. Advised to ask the Nuclear tech questions when they call today in preparation for her test. She verbalized understanding and had no further questions.

## 2022-10-22 ENCOUNTER — Ambulatory Visit: Payer: Medicare Other | Attending: Cardiology

## 2022-10-22 DIAGNOSIS — R0609 Other forms of dyspnea: Secondary | ICD-10-CM

## 2022-10-22 LAB — MYOCARDIAL PERFUSION IMAGING
LV dias vol: 59 mL (ref 46–106)
LV sys vol: 16 mL
Nuc Stress EF: 72 %
Peak HR: 84 {beats}/min
Rest HR: 62 {beats}/min
Rest Nuclear Isotope Dose: 10.7 mCi
SDS: 4
SRS: 6
SSS: 10
Stress Nuclear Isotope Dose: 32.3 mCi
TID: 1.31

## 2022-10-22 LAB — ECHOCARDIOGRAM COMPLETE: S' Lateral: 2.4 cm

## 2022-10-22 MED ORDER — TECHNETIUM TC 99M TETROFOSMIN IV KIT
10.7000 | PACK | Freq: Once | INTRAVENOUS | Status: AC | PRN
  Administered 2022-10-22: 10.7 via INTRAVENOUS

## 2022-10-22 MED ORDER — TECHNETIUM TC 99M TETROFOSMIN IV KIT
32.3000 | PACK | Freq: Once | INTRAVENOUS | Status: AC | PRN
  Administered 2022-10-22: 32.3 via INTRAVENOUS

## 2022-10-22 MED ORDER — REGADENOSON 0.4 MG/5ML IV SOLN
0.4000 mg | Freq: Once | INTRAVENOUS | Status: AC
  Administered 2022-10-22: 0.4 mg via INTRAVENOUS

## 2022-10-23 ENCOUNTER — Ambulatory Visit: Payer: Medicare Other

## 2022-10-23 NOTE — Patient Outreach (Signed)
Care Management & Coordination Services Pharmacy Note  10/23/2022 Name:  Mindy Ingram MRN:  [id number] DOB:  10/08/1945  Summary: Very pleasant 77 year old female presents for f/u CCM visit. She just completed her BS in Theology at the age of 34. She was married at 17 and has 1 child with 5 grandchildren. 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 Can't take NSAID without BP rising very high. Patient is asking for an alternative for pain    Subjective: Mindy Ingram is an 77 y.o. year old female who is a primary patient of Cox, Kirsten, MD.  The care coordination team was consulted for assistance with disease management and care coordination needs.    Engaged with patient by telephone for follow up visit.  Recent office visits:  None   Recent consult visits:  None   Hospital visits:  None   Objective:  Lab Results  Component Value Date   CREATININE 0.88 06/10/2022   BUN 17 06/10/2022   EGFR 68 06/10/2022   GFRNONAA >60 09/14/2008   GFRAA  09/14/2008    >60        The eGFR has been calculated using the MDRD equation. This calculation has not been validated in all clinical situations. eGFR's persistently <60 mL/min signify possible Chronic Kidney Disease.   NA 140 06/10/2022   K 3.7 06/10/2022   CALCIUM 9.5 06/10/2022   CO2 27 06/10/2022   GLUCOSE 109 (H) 06/10/2022    Lab Results  Component Value Date/Time   HGBA1C 7.6 (H) 06/10/2022 11:22 AM   HGBA1C 7.4 (H) 02/28/2022 09:50 AM   MICROALBUR 10 12/25/2020 10:17 AM    Last diabetic Eye exam:  Lab Results  Component Value Date/Time   HMDIABEYEEXA No Retinopathy 01/21/2022 12:00 AM    Last diabetic Foot  exam: No results found for: "HMDIABFOOTEX"   Lab Results  Component Value Date   CHOL 180 06/10/2022   HDL 60 06/10/2022   LDLCALC 105 (H) 06/10/2022   TRIG 84 06/10/2022   CHOLHDL 3.0 06/10/2022       Latest Ref Rng & Units 06/10/2022   11:22 AM 02/28/2022    9:50 AM 11/19/2021   10:04 AM  Hepatic Function  Total Protein 6.0 - 8.5 g/dL 6.6  6.7  6.8   Albumin 3.8 - 4.8 g/dL 4.3  4.4  4.3   AST 0 - 40 IU/L 16  21  22    ALT 0 - 32 IU/L 15  17  16    Alk Phosphatase 44 - 121 IU/L 50  44  51   Total Bilirubin 0.0 - 1.2 mg/dL <4.0  <9.8  0.3     Lab Results  Component Value Date/Time   TSH 1.370 11/19/2021 10:04 AM   TSH 1.450 09/20/2020 10:21 AM       Latest Ref Rng & Units 06/10/2022   11:22 AM 02/28/2022    9:50 AM 11/19/2021   10:04 AM  CBC  WBC 3.4 - 10.8 x10E3/uL 5.2  5.1  5.1   Hemoglobin 11.1 - 15.9 g/dL 11.9  14.7  82.9   Hematocrit 34.0 - 46.6 % 38.8  39.1  37.1   Platelets 150 - 450  x10E3/uL 323  298  309     No results found for: "VD25OH", "VITAMINB12"  Clinical ASCVD: Yes  The 10-year ASCVD risk score (Arnett DK, et al., 2019) is: 33.2%   Values used to calculate the score:     Age: 13 years     Sex: Female     Is Non-Hispanic African American: Yes     Diabetic: Yes     Tobacco smoker: No     Systolic Blood Pressure: 130 mmHg     Is BP treated: Yes     HDL Cholesterol: 60 mg/dL     Total Cholesterol: 180 mg/dL    Other: ([id number] if Afib, MMRC or CAT for COPD, ACT, DEXA)     05/26/2022    1:02 PM 02/28/2022    9:20 AM 08/06/2021   10:12 AM  Depression screen PHQ 2/9  Decreased Interest 0 0 0  Down, Depressed, Hopeless 0 0 0  PHQ - 2 Score 0 0 0     Social History   Tobacco Use  Smoking Status Never  Smokeless Tobacco Never   BP Readings from Last 3 Encounters:  10/07/22 118/72  06/10/22 118/62  05/27/22 116/62   Pulse Readings from Last 3 Encounters:  10/07/22 72  06/10/22 73  05/27/22 95   Wt Readings from Last 3 Encounters:   10/07/22 147 lb 12.8 oz (67 kg)  06/10/22 148 lb 8 oz (67.4 kg)  05/27/22 150 lb 3.2 oz (68.1 kg)   BMI Readings from Last 3 Encounters:  10/07/22 29.25 kg/m  06/10/22 29.99 kg/m  05/27/22 29.83 kg/m    Allergies  Allergen Reactions   Dairy Aid [Tilactase]     Hives, diarrhea   Lactose Anaphylaxis and Other (See Comments)   Atorvastatin Other (See Comments)    Severe myalgias   Hydrocodone-Acetaminophen     REACTION: sore throat with vicodin   Hydrocodone-Acetaminophen Other (See Comments)    sore throat with vicodin    Milk-Related Compounds Other (See Comments)   Mirabegron Other (See Comments)    Headache    Oxycontin [Oxycodone Hcl]     "makes me crazy"   Prochlorperazine Edisylate     REACTION: stroke-like symptoms with companzine   Rosuvastatin     Can take brand crestor-severe myalgias   Shellfish Allergy    Umeclidinium-Vilanterol Other (See Comments)    Lactose allergy   Percodan [Oxycodone-Aspirin] Palpitations    Fast heartbeat with percodan    Medications Reviewed Today     Reviewed by Dione Housekeeper, CMA (Certified Medical Assistant) on 10/07/22 at 1308  Med List Status: <None>   Medication Order Taking? Sig Documenting Provider Last Dose Status Informant  acyclovir (ZOVIRAX) 400 MG tablet [id number]  Take 1 tablet (400 mg total) by mouth 2 (two) times daily as needed (outbreak). Cox, Kirsten, MD  Active   albuterol (VENTOLIN HFA) [phone] Base) MCG/ACT inhaler [id number]  Inhale 2 puffs into the lungs every 6 (six) hours as needed for wheezing or shortness of breath. [provider]  Active   amLODipine (NORVASC) 5 MG tablet [id number]  Take 5 mg by mouth daily. [provider]  Active   aspirin EC 81 MG tablet [id number]  Take 81 mg by mouth daily. [provider]  Active   azelastine (ASTELIN) 0.1 % nasal spray [id number]  Place 1 spray into both nostrils 2 (two) times daily as needed for allergies or rhinitis. [provider]  Active   Blood Glucose  Monitoring Suppl (ONE TOUCH ULTRA 2) w/Device KIT [id number]  Use as instructed five times daily. E11.69 CoxFritzi Mandes, MD  Active   cetirizine (ZYRTEC) 10 MG tablet [id number]  Take 1 tablet (10 mg total) by mouth daily as needed for allergies. Lurline Del, FNP  Active   Cholecalciferol (VITAMIN D PO) [id number]  Take 2,000 Units by mouth daily.  [provider]  Active   Elderberry 575 MG/5ML SYRP [id number]  Take 5 mLs by mouth daily. [provider]  Active   ezetimibe (ZETIA) 10 MG tablet [id number]  TAKE 1 TABLET BY MOUTH DAILY Cox, Kirsten, MD  Active   fluticasone (FLONASE) 50 MCG/ACT nasal spray [id number]  USE 1 SPRAY IN EACH NOSTRIL EVERY DAY Cox, Kirsten, MD  Active   Glucose Blood (BLOOD GLUCOSE TEST STRIPS 333) STRP [id number]  100 each by In Vitro route 3 (three) times daily before meals. Blane Ohara, MD  Active   Lancets 30G MISC [id number]  3 Boxes by Does not apply route every 4 (four) hours as needed. Lurline Del, FNP  Active   losartan-hydrochlorothiazide (HYZAAR) 100-25 MG tablet [id number]  TAKE 1 TABLET BY MOUTH DAILY Cox, Kirsten, MD  Active   meloxicam (MOBIC) 7.5 MG tablet [id number]  Take 7.5 mg by mouth daily as needed for pain. [provider]  Active   metFORMIN (GLUCOPHAGE-XR) 500 MG 24 hr tablet [id number]  Take 500 mg by mouth 2 (two) times daily. [provider]  Active   methocarbamol (ROBAXIN) 500 MG tablet [id number]  Take 1 tablet (500 mg total) by mouth 2 (two) times daily. Cox, Kirsten, MD  Active   metoprolol (TOPROL-XL) 200 MG 24 hr tablet [id number]  TAKE 1 TABLET(200 MG) BY MOUTH DAILY Cox, Kirsten, MD  Active   Multiple Vitamins-Minerals (COMPLETE ENERGY) TABS [phone]  Take 1 tablet by mouth daily. [provider]  Active Self  nitroGLYCERIN (NITROSTAT) 0.4 MG SL tablet [id number]  Place 1 tablet (0.4 mg total) under the tongue every 5 (five) minutes as needed for chest pain. Revankar,  Aundra Dubin, MD  Active   Omega-3 Fatty Acids (FISH OIL) 1000 MG CAPS [id number]  Take 1 capsule by mouth daily. [provider]  Active   omeprazole (PRILOSEC) 20 MG capsule [id number]  TAKE 1 CAPSULE BY MOUTH DAILY Cox, Kirsten, MD  Active   Semaglutide,0.25 or 0.5MG /DOS, 2 MG/3ML SOPN [id number]  Inject 0.25 mg into the skin once a week. Lurline Del, FNP  Active   traMADol (ULTRAM) 50 MG tablet [id number]  Take 50 mg by mouth 2 (two) times daily as needed for pain. [provider]  Active   triamcinolone acetonide (KENALOG-40) injection 60 mg [id number]   Marianne Sofia, PA-C  Active   triamcinolone ointment (KENALOG) 0.1 % [id number]  Apply 1 application topically 2 (two) times daily. Cox, Kirsten, MD  Active   Turmeric (QC TUMERIC COMPLEX PO) [id number]  Take 1,000 mg by mouth daily. Every other day [provider]  Active   Med List Note Maple Hudson, Rennis Chris, MD 03/09/11 1408): CPAP 12, reduced to 10 as of 03/06/2011            SDOH:  (Social Determinants of Health) assessments and interventions performed: Yes SDOH Interventions    Flowsheet Row Care Coordination from 08/13/2022 in [facility] Clinical Support from 05/26/2022 in [facility] Chronic Care Management from 02/11/2022 in [facility] Chronic Care Management from 01/15/2022 in [facility] Chronic Care Management from 07/03/2021 in [place]  SDOH Interventions       Food Insecurity Interventions -- Intervention Not Indicated -- -- --  Housing Interventions -- Intervention Not Indicated -- -- --  Transportation Interventions Intervention Not Indicated Intervention Not Indicated Intervention Not Indicated Intervention Not Indicated Intervention Not Indicated  Utilities Interventions -- Intervention Not Indicated -- -- --  Alcohol Usage Interventions -- Intervention Not Indicated (Score <7) -- -- --  Financial Strain  Interventions Other (Comment)  [PAP for meds] Intervention Not Indicated Intervention Not Indicated Intervention Not Indicated Intervention Not Indicated  Physical Activity Interventions -- Intervention Not Indicated -- -- --  Stress Interventions -- Intervention Not Indicated -- -- --  Social Connections Interventions -- Intervention Not Indicated -- -- --       Medication Assistance:   Ozempic: -2024: Start PAP   Name and location of Current pharmacy:  [id number], [place] - [address] [place] [address] Phone: [phone] Fax: [phone]  [facility] - [place], [place] - [address] [place]  [address] Phone: [phone] Fax: [phone]   Compliance/Adherence/Medication fill history: Star Rating Drugs:  Medication:                Last Fill:         Day Supply Dapagliflozin               06/19/22          90ds Losartan-HCTZ           06/01/22-03/05/22 100ds   Care Gaps: Annual wellness visit in last year? Yes, 05/26/22   If Diabetic: Last eye exam / retinopathy screening: 01/21/22 Last diabetic foot exam: 06/10/22   Assessment/Plan    Hypertension (BP goal <140/80) BP Readings from Last 3 Encounters:  10/07/22 118/72  06/10/22 118/62  05/27/22 116/62  -Controlled -Current treatment: amlodipine  Appropriate, Query effective,  Losartan-hydrochlorothiazide 100/25 mg daily Appropriate, Query effective,  Metoprolol succinate 200 mg daily Appropriate, Query effective,  -Medications previously tried: none reported  -Current home readings:  28 December 2021: (States tends to be higher in AM before she takes her meds) 119/69 113/62 125/65 126/66 136/76 140/79 137/79 01/14/22: 152/82 01/15/22: 162/87, 127/77 August 2023 02/11/22: 127/74 02/10/22: 139/80 02/09/22: 126/76 02/08/22: 128/72 02/07/22: 139/77 Feb  2024: 165/79 141/71 169/91 167/(?) March 2024: -146/76 -143/76 -151/77 -129/69 -151/87 -154/86 -132/76 -135/77 -Wasn't taking whole Amlodipine. Will start April 2024: 09/29/22: 136/74 09/30/22: 142/75 130/71 127/73 122/72 134/68 139/77 112/64 10/13/22: 117/64 127/72 126/73 122/65 136/83 132/73 -Current dietary habits: eats breakfast and supper mainly.  -Current exercise habits: Was previously in karate and exercise class. She hopes to rejoin the class soon. Had stopped prior to COVID.  -Affirmshypotensive/hypertensive symptoms -Educated on BP goals and benefits of medications for prevention of heart attack, stroke and kidney damage; Daily salt intake goal < 2300 mg; Exercise goal of 150 minutes per week; Importance of home blood pressure monitoring; July 2023: BP is elevated. However, patient states she becomes dizzy at a BP around 110's-120 systolic. Patient is constantly taking half a pill or skipping doses to prevent herself from becoming dizzy (She had a sever fall last month). She is taking Metoprolol and Losartan at night and Amlodipine in the AM. Counseled to take Amlodipine at night to prevent hypotension during the day and because  her BP tends to be fine during the day but will be too high in AM when she tests her BP first thing upon waking/before pills.Will have patient write BP daily and write how/when she took meds and f/u next month. Will also let PCP know August 2023: Switched to taking Amlodpine in PM and BP after meds, readings look great! Feb 2024: BP is elevated again. Spoke with patient, she thinks it's because she hasn't been eating well and is in pain. She'd like a week to see if she can get them back down to normal. Will have Danielle call in a week March 2024: BP elevated but she still hasn't started Amlodipine 5mg . Wanted to wait to talk with me. Will have team reach out in a few weeks to get BP readings April 2024: Maintain therapy. BP seems good if she  doesn't take Meloxicam   Hyperlipidemia: (LDL goal < 100) The 10-year ASCVD risk score (Arnett DK, et al., 2019) is: 33.2%   Values used to calculate the score:     Age: 48 years     Sex: Female     Is Non-Hispanic African American: Yes     Diabetic: Yes     Tobacco smoker: No     Systolic Blood Pressure: 130 mmHg     Is BP treated: Yes     HDL Cholesterol: 60 mg/dL     Total Cholesterol: 180 mg/dL Lab Results  Component Value Date   CHOL 180 06/10/2022   CHOL 172 02/28/2022   CHOL 159 11/19/2021   Lab Results  Component Value Date   HDL 60 06/10/2022   HDL 48 02/28/2022   HDL 53 11/19/2021   Lab Results  Component Value Date   LDLCALC 105 (H) 06/10/2022   LDLCALC 96 02/28/2022   LDLCALC 86 11/19/2021   Lab Results  Component Value Date   TRIG 84 06/10/2022   TRIG 158 (H) 02/28/2022   TRIG 108 11/19/2021   Lab Results  Component Value Date   CHOLHDL 3.0 06/10/2022   CHOLHDL 3.6 02/28/2022   CHOLHDL 3.0 11/19/2021   No results found for: "LDLDIRECT" Last vitamin D No results found for: "25OHVITD2", "25OHVITD3", "VD25OH" Lab Results  Component Value Date   TSH 1.370 11/19/2021  -Controlled -Current treatment: aspirin ec 81 mg daily Appropriate, Effective, Safe, Accessible Ezetimibe 10mg  Appropriate, Effective, Safe, Accessible -Medications previously tried: brand name Crestor, atorvastatin -Current dietary patterns: reports breakfast meat and hot dogs  -Current exercise habits: was previously involved in karate and exercise class prior to COVID -Educated on Cholesterol goals;  Benefits of statin for ASCVD risk reduction; Importance of limiting foods high in cholesterol; Exercise goal of 150 minutes per week; Jan 2023: Recommended considering Crestor 10 mg three times weekly. Patient reports she can tolerate brand name Crestor but the generic leaves her achy. Pharmacist to request Dispense as Written for brand name Crestor.  Assessed patient finances.  Patient approved for [employer] to cover cost of Crestor.  July 2023: Patient states she stopped Rosuvastatin once she started Ezetimibe. Still on medslist, will ask PCP about removing. August 2023: PCP removed statin from medslist   Diabetes (A1c goal <8%) Lab Results  Component Value Date   HGBA1C 7.6 (H) 06/10/2022   HGBA1C 7.4 (H) 02/28/2022   HGBA1C 7.2 (H) 11/19/2021   Lab Results  Component Value Date   MICROALBUR 10 12/25/2020   LDLCALC 105 (H) 06/10/2022   CREATININE 0.88 06/10/2022    Lab Results  Component  Value Date   NA 140 06/10/2022   K 3.7 06/10/2022   CREATININE 0.88 06/10/2022   EGFR 68 06/10/2022   GFRNONAA >60 09/14/2008   GLUCOSE 109 (H) 06/10/2022    Lab Results  Component Value Date   WBC 5.2 06/10/2022   HGB 13.0 06/10/2022   HCT 38.8 06/10/2022   MCV 90 06/10/2022   PLT 323 06/10/2022    Lab Results  Component Value Date   LABMICR 5.6 08/06/2021   MICROALBUR 10 12/25/2020   -Controlled -Current medications: Metformin  twice daily Appropriate, Effective, Safe, Accessible Ozempic 0.25mg  Appropriate, Query effective,  Feb 2024: Patient never picked up. Still taking Metformin March 2024: Still hasn't submitted One touch verio test strips twice daily  Lancets daily prior to meal  -Medications previously tried: Metformin  3/day (ADR's) -Current home glucose readings fasting glucose:  May 2022: 120, 121, 136, 156, 124, 128, 116, 128, 164 (birthday cake), 143, 168 (birthday cake) Jan 2023: Didn't have readings on her July 2023: Didn't have readings on her August 2023: 02/11/22: 132 02/09/22: 159 @ 0800, 206 @ 2130, 206 @ 2200 02/08/22: 142 @ 1100, 234 @ 1800,  02/07/22: 142 @ 0830, 171 @ 1900 Feb 2024: 08/12/22: 127 - (193 Post food) - 165 08/11/22: 138, 116 08/10/22: 124, 144 March 2024: Hasn't started new Metformin dose April 2024: 09/29/22: 180 -Denies  hypoglycemic/hyperglycemic symptoms -Current meal patterns:  Late breakfast or early lunch: egg, bacon or sausage with grits or pancake occasionally (a few times a month). Vilma Meckel Croissant once a week as a treat.   dinner: dines out mainly.  snacks:  more birthday cake lately since celebrating all month long),  drinks: unsweet tea, coffee  -Current exercise: limited since not attending exercise class after COVID -Educated on A1c and blood sugar goals; Complications of diabetes including kidney damage, retinal damage, and cardiovascular disease; Exercise goal of 150 minutes per week; Benefits of routine self-monitoring of blood sugar; Carbohydrate counting and/or plate method -Counseled to check feet daily and get yearly eye exams -Counseled on diet and exercise extensively Recommended to continue current medication Jan 2023: Patient doesn't want to increase Metformin to 3/day nor does she want to start Ozempic. Updated medslist and will let PCP know August 2023: Sugars looking better. Counseled to test sugars before eating, not after Feb 2024: Patient prescribed Xigduo a few months ago but was too expensive so she didn't start. Does have PAP but she had concerns about it. She told me, "Can we do PAP for Ozempic instead? I was given that before and I've never felt better but someone told me there was no PAP." I explained to her there was a shortage last year but we're able to do PAP now. Will ask Rekha if we can change Xigduo to Ozempic + Metformin per patient's wishes. Sent direct msg to PCP March 2024:  -Patient hasn't started new Metformin dose. Counseled to start -Ozempic: Patient hasn't returned PAP because she was concerned about them looking into her finances. She agreed to submit after we spoke about it April 2024: Counseled patient to bring in PAP      Disc Degeneration (Goal: manage pain) -Pain Scale Jan 2023: Patient didn't specify, only said she is very content August 2023:  Didn't specify pain scale, just that she feels much better Feb 2024: Didn't specify pain scale. Stated had Shoulder Sx last month but feels much better -Controlled -Current treatment  Tramadol 50 mg bid prn Appropriate, Effective, Safe, Accessible Acetaminophen Appropriate, Effective, Safe, Accessible -Medications previously tried: IBU, Meloxicam (BP rises) July 2023: IBU was on patients meds list and Meloxicam wasn't. Patient states she has both but that she doesn't take both. Asked her which she prefers and that we should stick with one. She called back and said she prefers Meloxicam. Updated Medslist and once again counseled not to take together August 2023: Patient very happy with PT, states is helping pain greatly Feb 2024: Counseled once again to not take IBU. She states she hasn't. She asked me about Tylenol and I explained she could take Acetaminophen/Tylenol but to get the generic and spent time counseling on 3g/day MDD May 2024: Patient stopped Meloxicam due to it bumping up her BP every time she takes it  CPP F/U May 2024  Artelia Laroche, Pharm.D. - [phone]

## 2022-11-06 ENCOUNTER — Telehealth: Payer: Self-pay

## 2022-11-06 ENCOUNTER — Other Ambulatory Visit: Payer: Self-pay | Admitting: Family Medicine

## 2022-11-06 NOTE — Progress Notes (Signed)
Called pt to check to see if she has returned her Ozempic PAP to Dr. Sedalia Muta office and just make sure this gets processed.   11/12/22-Spoke to pt and she stated she has not returned the application. She isn't sure she wants to be on this due to side effects. She really wanted to be on the [facility] since she felt the best on this but we weren't able to get PAP for this. She was not sure why she needed to start another shot and just has concerns about starting this is why she has not sent the paperwork back. Pt stated the Metformin 850mg  does not last and hold her throughout the day like the Metformin ER 500mg  did. Pt wants to be on the least amount of medications and maybe work with the dietician instead to help lower sugars verses getting on another med. Please advise.   Recent blood sugar readings: 11/03/22 113 11/02/22 122 11/08/22 148 11/07/22 148 11/09/22 134 11/10/22 160 (ate cake night before) 11/12/22 128   Roxana Hires, CMA Clinical Pharmacist Assistant  [phone]

## 2022-11-08 ENCOUNTER — Other Ambulatory Visit: Payer: Self-pay | Admitting: Family Medicine

## 2022-11-10 ENCOUNTER — Other Ambulatory Visit: Payer: Self-pay

## 2022-11-10 MED ORDER — METFORMIN HCL 850 MG PO TABS: 850.0000 mg | ORAL_TABLET | Freq: Two times a day (BID) | ORAL | 1 refills | Status: DC

## 2022-11-10 MED ORDER — METFORMIN HCL 850 MG PO TABS: 850.0000 mg | ORAL_TABLET | Freq: Two times a day (BID) | ORAL | 0 refills | Status: DC

## 2022-11-13 ENCOUNTER — Telehealth: Payer: Self-pay

## 2022-11-13 NOTE — Telephone Encounter (Signed)
Called patient left detail voicemail doctor cox recommends patient take tylenol due to patient not able to take nsaid due to BP.

## 2022-11-26 ENCOUNTER — Telehealth: Payer: Self-pay

## 2022-11-26 NOTE — Progress Notes (Signed)
11/26/22- Called and left a detailed message on pt vm with information and to reach back out  Roxana Hires, [facility] Clinical Pharmacist Assistant  [phone]

## 2022-11-26 NOTE — Progress Notes (Signed)
11/26/22- Pt called back and stated she has tried the Voltaren for pain and it worked really well for her and she stated she also got a call from Dr. Sedalia Muta office to start on Tylenol but she wasn't sure which one she needs to be on. She stated Voltaren worked well for her and will be willing to be on it again. She was on some samples a while back and still has some left over. Can this be sent in to [facility] [address]. Also pt wanted to follow up on message about Metformin on encounter 11/06/22.   Please advise!   Roxana Hires, CMA Clinical Pharmacist Assistant  [phone]

## 2022-11-29 ENCOUNTER — Other Ambulatory Visit: Payer: Self-pay | Admitting: Family Medicine

## 2022-12-02 ENCOUNTER — Telehealth: Payer: Self-pay

## 2022-12-02 NOTE — Progress Notes (Signed)
Care Management & Coordination Services Pharmacy Team  Reason for Encounter: Appointment Reminder  Contacted patient to confirm telephone appointment with Artelia Laroche, PharmD on 12/04/22 at 11:30 am.  Spoke with patient on 12/02/2022   Do you have any problems getting your medications? No  What is your top health concern you would like to discuss at your upcoming visit? Previous notes from calls I have made with patient that still needs addressing: "Spoke to pt and she stated she has not returned the Ozempic application. She isn't sure she wants to be on this due to side effects. She really wanted to be on the [facility] since she felt the best on this but we weren't able to get PAP for this. She was not sure why she needed to start another shot and just has concerns about starting this is why she has not sent the paperwork back. Pt stated the Metformin 850mg  does not last and hold her throughout the day like the Metformin ER 500mg  did. Pt wants to be on the least amount of medications and maybe work with the dietician instead to help lower sugars verses getting on another med.   Have you seen any other providers since your last visit with PCP? No   Chart review:  Recent office visits:  None  Recent consult visits:  None  Hospital visits:  None   Star Rating Drugs:  Medication:  Last Fill: Day Supply Losartan-HCTZ 11/13/22-09/04/22  100ds Metformin   11/15/22-09/25/22 45/100ds Semaglutide (Has not processed PAP yet)  Care Gaps: Annual wellness visit in last year? Yes Colonoscopy: Overdue since 06/12/22 Urine ACR: Overdue since 08/06/22   Roxana Hires, CMA Clinical Pharmacist Assistant  [phone]

## 2022-12-04 ENCOUNTER — Ambulatory Visit: Payer: Medicare Other

## 2022-12-04 ENCOUNTER — Other Ambulatory Visit: Payer: Self-pay | Admitting: Family Medicine

## 2022-12-04 ENCOUNTER — Other Ambulatory Visit: Payer: Self-pay

## 2022-12-04 DIAGNOSIS — E1169 Type 2 diabetes mellitus with other specified complication: Secondary | ICD-10-CM

## 2022-12-04 MED ORDER — BLOOD GLUCOSE TEST STRIPS 333 VI STRP: 100.0000 | ORAL_STRIP | Freq: Three times a day (TID) | 3 refills | Status: DC

## 2022-12-04 MED ORDER — DICLOFENAC SODIUM 1 % EX GEL: 4.0000 g | Freq: Four times a day (QID) | CUTANEOUS | 2 refills | Status: DC | PRN

## 2022-12-04 MED ORDER — LANCETS 30G MISC: 3.0000 | 2 refills | Status: DC | PRN

## 2022-12-04 MED ORDER — ONETOUCH ULTRA 2 W/DEVICE KIT: PACK | 0 refills | Status: DC

## 2022-12-04 NOTE — Patient Outreach (Signed)
Care Management & Coordination Services Pharmacy Note  12/04/2022 Name:  Mindy Ingram MRN:  [id number] DOB:  [birth year]  Summary: Very pleasant 77 year old female presents for f/u CCM visit. She just completed her BS in Theology at the age of 36. She was married at 29 and has 1 child with 5 grandchildren. 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 Removed from medslist, it's been 3 months and patient admitted she doesn't want more medications -BP looking better, see note for details    Subjective: Mindy Ingram is an 77 y.o. year old female who is a primary patient of Cox, Kirsten, MD.  The care coordination team was consulted for assistance with disease management and care coordination needs.    Engaged with patient by telephone for follow up visit.  Recent office visits:  None   Recent consult visits:  None   Hospital visits:  None   Objective:  Lab Results  Component Value Date   CREATININE 0.88 06/10/2022   BUN 17 06/10/2022   EGFR 68 06/10/2022   GFRNONAA >60 09/14/2008   GFRAA  09/14/2008    >60        The eGFR has been calculated using the MDRD equation. This calculation has not been validated in all clinical situations. eGFR's persistently <60 mL/min signify possible Chronic Kidney Disease.   NA 140 06/10/2022   K 3.7 06/10/2022   CALCIUM 9.5 06/10/2022   CO2 27 06/10/2022   GLUCOSE 109 (H) 06/10/2022    Lab Results  Component Value Date/Time   HGBA1C 7.6 (H) 06/10/2022 11:22 AM   HGBA1C 7.4 (H) 02/28/2022 09:50 AM   MICROALBUR 10 12/25/2020 10:17 AM    Last diabetic Eye exam:  Lab Results  Component Value Date/Time   HMDIABEYEEXA No Retinopathy 01/21/2022 12:00 AM     Last diabetic Foot exam: No results found for: "HMDIABFOOTEX"   Lab Results  Component Value Date   CHOL 180 06/10/2022   HDL 60 06/10/2022   LDLCALC 105 (H) 06/10/2022   TRIG 84 06/10/2022   CHOLHDL 3.0 06/10/2022       Latest Ref Rng & Units 06/10/2022   11:22 AM 02/28/2022    9:50 AM 11/19/2021   10:04 AM  Hepatic Function  Total Protein 6.0 - 8.5 g/dL 6.6  6.7  6.8   Albumin 3.8 - 4.8 g/dL 4.3  4.4  4.3   AST 0 - 40 IU/L 16  21  22    ALT 0 - 32 IU/L 15  17  16    Alk Phosphatase 44 - 121 IU/L 50  44  51   Total Bilirubin 0.0 - 1.2 mg/dL <7.8  <2.9  0.3     Lab Results  Component Value Date/Time   TSH 1.370 11/19/2021 10:04 AM   TSH 1.450 09/20/2020 10:21 AM       Latest Ref Rng & Units 06/10/2022   11:22 AM 02/28/2022    9:50 AM 11/19/2021   10:04 AM  CBC  WBC 3.4 - 10.8 x10E3/uL 5.2  5.1  5.1   Hemoglobin 11.1 - 15.9 g/dL 56.2  13.0  86.5   Hematocrit 34.0 - 46.6 % 38.8  39.1  37.1   Platelets 150 - 450  x10E3/uL 323  298  309     No results found for: "VD25OH", "VITAMINB12"  Clinical ASCVD: Yes  The 10-year ASCVD risk score (Arnett DK, et al., 2019) is: 34.7%   Values used to calculate the score:     Age: 45 years     Sex: Female     Is Non-Hispanic African American: Yes     Diabetic: Yes     Tobacco smoker: No     Systolic Blood Pressure: 130 mmHg     Is BP treated: Yes     HDL Cholesterol: 60 mg/dL     Total Cholesterol: 180 mg/dL    Other: ([id number] if Afib, MMRC or CAT for COPD, ACT, DEXA)     05/26/2022    1:02 PM 02/28/2022    9:20 AM 08/06/2021   10:12 AM  Depression screen PHQ 2/9  Decreased Interest 0 0 0  Down, Depressed, Hopeless 0 0 0  PHQ - 2 Score 0 0 0     Social History   Tobacco Use  Smoking Status Never  Smokeless Tobacco Never   BP Readings from Last 3 Encounters:  10/07/22 118/72  06/10/22 118/62  05/27/22 116/62   Pulse Readings from Last 3 Encounters:  10/07/22 72  06/10/22 73  05/27/22 95   Wt Readings from  Last 3 Encounters:  10/07/22 147 lb 12.8 oz (67 kg)  06/10/22 148 lb 8 oz (67.4 kg)  05/27/22 150 lb 3.2 oz (68.1 kg)   BMI Readings from Last 3 Encounters:  10/07/22 29.25 kg/m  06/10/22 29.99 kg/m  05/27/22 29.83 kg/m    Allergies  Allergen Reactions   Dairy Aid [Tilactase]     Hives, diarrhea   Lactose Anaphylaxis and Other (See Comments)   Atorvastatin Other (See Comments)    Severe myalgias   Hydrocodone-Acetaminophen     REACTION: sore throat with vicodin   Hydrocodone-Acetaminophen Other (See Comments)    sore throat with vicodin    Milk-Related Compounds Other (See Comments)   Mirabegron Other (See Comments)    Headache    Oxycontin [Oxycodone Hcl]     "makes me crazy"   Prochlorperazine Edisylate     REACTION: stroke-like symptoms with companzine   Rosuvastatin     Can take brand crestor-severe myalgias   Shellfish Allergy    Umeclidinium-Vilanterol Other (See Comments)    Lactose allergy   Percodan [Oxycodone-Aspirin] Palpitations    Fast heartbeat with percodan    Medications Reviewed Today     Reviewed by Dione Housekeeper, CMA (Certified Medical Assistant) on 10/07/22 at 1308  Med List Status: <None>   Medication Order Taking? Sig Documenting Provider Last Dose Status Informant  acyclovir (ZOVIRAX) 400 MG tablet [id number]  Take 1 tablet (400 mg total) by mouth 2 (two) times daily as needed (outbreak). Cox, Kirsten, MD  Active   albuterol (VENTOLIN HFA) [phone] Base) MCG/ACT inhaler [id number]  Inhale 2 puffs into the lungs every 6 (six) hours as needed for wheezing or shortness of breath. [provider]  Active   amLODipine (NORVASC) 5 MG tablet [id number]  Take 5 mg by mouth daily. [provider]  Active   aspirin EC 81 MG tablet [id number]  Take 81 mg by mouth daily. [provider]  Active   azelastine (ASTELIN) 0.1 % nasal spray [id number]  Place 1 spray into both nostrils 2 (two) times daily as needed for allergies or  rhinitis. [provider]  Active   Blood Glucose  Monitoring Suppl (ONE TOUCH ULTRA 2) w/Device KIT [id number]  Use as instructed five times daily. E11.69 CoxFritzi Mandes, MD  Active   cetirizine (ZYRTEC) 10 MG tablet [id number]  Take 1 tablet (10 mg total) by mouth daily as needed for allergies. Lurline Del, FNP  Active   Cholecalciferol (VITAMIN D PO) [id number]  Take 2,000 Units by mouth daily.  [provider]  Active   Elderberry 575 MG/5ML SYRP [id number]  Take 5 mLs by mouth daily. [provider]  Active   ezetimibe (ZETIA) 10 MG tablet [id number]  TAKE 1 TABLET BY MOUTH DAILY Cox, Kirsten, MD  Active   fluticasone (FLONASE) 50 MCG/ACT nasal spray [id number]  USE 1 SPRAY IN EACH NOSTRIL EVERY DAY Cox, Kirsten, MD  Active   Glucose Blood (BLOOD GLUCOSE TEST STRIPS 333) STRP [id number]  100 each by In Vitro route 3 (three) times daily before meals. Blane Ohara, MD  Active   Lancets 30G MISC [id number]  3 Boxes by Does not apply route every 4 (four) hours as needed. Lurline Del, FNP  Active   losartan-hydrochlorothiazide (HYZAAR) 100-25 MG tablet [id number]  TAKE 1 TABLET BY MOUTH DAILY Cox, Kirsten, MD  Active   meloxicam (MOBIC) 7.5 MG tablet [id number]  Take 7.5 mg by mouth daily as needed for pain. [provider]  Active   metFORMIN (GLUCOPHAGE-XR) 500 MG 24 hr tablet [id number]  Take 500 mg by mouth 2 (two) times daily. [provider]  Active   methocarbamol (ROBAXIN) 500 MG tablet [id number]  Take 1 tablet (500 mg total) by mouth 2 (two) times daily. Cox, Kirsten, MD  Active   metoprolol (TOPROL-XL) 200 MG 24 hr tablet [id number]  TAKE 1 TABLET(200 MG) BY MOUTH DAILY Cox, Kirsten, MD  Active   Multiple Vitamins-Minerals (COMPLETE ENERGY) TABS [phone]  Take 1 tablet by mouth daily. [provider]  Active Self  nitroGLYCERIN (NITROSTAT) 0.4 MG SL tablet [id number]  Place 1 tablet (0.4 mg total) under the tongue every 5 (five) minutes as needed for  chest pain. Revankar, Aundra Dubin, MD  Active   Omega-3 Fatty Acids (FISH OIL) 1000 MG CAPS [id number]  Take 1 capsule by mouth daily. [provider]  Active   omeprazole (PRILOSEC) 20 MG capsule [id number]  TAKE 1 CAPSULE BY MOUTH DAILY Cox, Kirsten, MD  Active   Semaglutide,0.25 or 0.5MG /DOS, 2 MG/3ML SOPN [id number]  Inject 0.25 mg into the skin once a week. Lurline Del, FNP  Active   traMADol (ULTRAM) 50 MG tablet [id number]  Take 50 mg by mouth 2 (two) times daily as needed for pain. [provider]  Active   triamcinolone acetonide (KENALOG-40) injection 60 mg [id number]   Marianne Sofia, PA-C  Active   triamcinolone ointment (KENALOG) 0.1 % [id number]  Apply 1 application topically 2 (two) times daily. Cox, Kirsten, MD  Active   Turmeric (QC TUMERIC COMPLEX PO) [id number]  Take 1,000 mg by mouth daily. Every other day [provider]  Active   Med List Note Maple Hudson, Rennis Chris, MD 03/09/11 1408): CPAP 12, reduced to 10 as of 03/06/2011            SDOH:  (Social Determinants of Health) assessments and interventions performed: Yes SDOH Interventions    Flowsheet Row Care Coordination from 12/04/2022 in [facility] Care Coordination from 08/13/2022 in [facility] Clinical Support from 05/26/2022 in [facility] Chronic Care Management from 02/11/2022 in [facility] Chronic Care  Management from 01/15/2022 in [facility] Chronic Care Management from 07/03/2021 in [facility]  SDOH Interventions        Food Insecurity Interventions -- -- Intervention Not Indicated -- -- --  Housing Interventions -- -- Intervention Not Indicated -- -- --  Transportation Interventions -- Intervention Not Indicated Intervention Not Indicated Intervention Not Indicated Intervention Not Indicated Intervention Not Indicated  Utilities Interventions -- -- Intervention Not Indicated -- -- --  Alcohol Usage  Interventions -- -- Intervention Not Indicated (Score <7) -- -- --  Financial Strain Interventions Other (Comment)  [PAP] Other (Comment)  [PAP for meds] Intervention Not Indicated Intervention Not Indicated Intervention Not Indicated Intervention Not Indicated  Physical Activity Interventions -- -- Intervention Not Indicated -- -- --  Stress Interventions -- -- Intervention Not Indicated -- -- --  Social Connections Interventions -- -- Intervention Not Indicated -- -- --       Medication Assistance:   Ozempic: -2024: Start PAP   Name and location of Current pharmacy:  [id number], [place] - [address] [place] [address] Phone: [phone] Fax: [phone]  [facility] - [place], [place] - [address] [place] [address] Phone: [phone] Fax: [phone]   Compliance/Adherence/Medication fill history: Star Rating Drugs:  Medication:                Last Fill:         Day Supply Dapagliflozin               06/19/22          90ds Losartan-HCTZ           06/01/22-03/05/22 100ds   Care Gaps: Annual wellness visit in last year? Yes, 05/26/22   If Diabetic: Last eye exam / retinopathy screening: 01/21/22 Last diabetic foot exam: 06/10/22   Assessment/Plan    Hypertension (BP goal <140/80) BP Readings from Last 3 Encounters:  10/07/22 118/72  06/10/22 118/62  05/27/22 116/62  -Controlled -Current treatment: amlodipine 5mg  Appropriate, Query effective,  Losartan-hydrochlorothiazide 100/25 mg daily Appropriate, Query effective,  Metoprolol succinate 200 mg daily Appropriate, Query effective,  -Medications previously tried: none reported  -Current home readings:  28 December 2021: (States tends to be higher in AM before she takes her meds) 119/69 113/62 125/65 126/66 136/76 140/79 137/79 01/14/22: 152/82 01/15/22: 162/87, 127/77 August  2023 02/11/22: 127/74 02/10/22: 139/80 02/09/22: 126/76 02/08/22: 128/72 02/07/22: 139/77 Feb 2024: 165/79 141/71 169/91 167/(?) March 2024: -146/76 -143/76 -151/77 -129/69 -151/87 -154/86 -132/76 -135/77 -Wasn't taking whole Amlodipine. Will start April 2024: 09/29/22: 136/74 09/30/22: 142/75 130/71 127/73 122/72 134/68 139/77 112/64 10/13/22: 117/64 127/72 126/73 122/65 136/83 132/73 June 2024: 11/21/22: 125/70, 124/68 11/22/22: 120/69, 122/70 11/23/22: 121/68, 127/65 11/25/22: 138/78, 148/90 5/29/245: 130/70, 146/79 11/27/22: 146/82, 118/70 11/30/22: 125/67,  135/71 12/01/22: 121/67, 140/72 12/02/22: 115/?, 136/71 12/03/22: 126/? 12/04/22: 132/76 -Current dietary habits: eats breakfast and supper mainly.  -Current exercise habits: Was previously in karate and exercise class. She hopes to rejoin the class soon. Had stopped prior to COVID.  -Affirmshypotensive/hypertensive symptoms -Educated on BP goals and benefits of medications for prevention of heart attack, stroke and kidney damage; Daily salt intake goal < 2300 mg; Exercise goal of 150 minutes per week; Importance of home blood pressure monitoring; July 2023: BP is elevated. However, patient states she becomes dizzy at  a BP around 110's-120 systolic. Patient is constantly taking half a pill or skipping doses to prevent herself from becoming dizzy (She had a sever fall last month). She is taking Metoprolol and Losartan at night and Amlodipine in the AM. Counseled to take Amlodipine at night to prevent hypotension during the day and because her BP tends to be fine during the day but will be too high in AM when she tests her BP first thing upon waking/before pills.Will have patient write BP daily and write how/when she took meds and f/u next month. Will also let PCP know August 2023: Switched to taking Amlodpine in PM and BP after meds, readings look great! Feb 2024: BP is elevated again. Spoke with patient, she thinks it's  because she hasn't been eating well and is in pain. She'd like a week to see if she can get them back down to normal. Will have Danielle call in a week March 2024: BP elevated but she still hasn't started Amlodipine 5mg . Wanted to wait to talk with me. Will have team reach out in a few weeks to get BP readings April 2024: Maintain therapy. BP seems good if she doesn't take Meloxicam June 2024: BP looking better, will defer to PCP   Hyperlipidemia: (LDL goal < 100) The 10-year ASCVD risk score (Arnett DK, et al., 2019) is: 34.7%   Values used to calculate the score:     Age: 47 years     Sex: Female     Is Non-Hispanic African American: Yes     Diabetic: Yes     Tobacco smoker: No     Systolic Blood Pressure: 130 mmHg     Is BP treated: Yes     HDL Cholesterol: 60 mg/dL     Total Cholesterol: 180 mg/dL Lab Results  Component Value Date   CHOL 180 06/10/2022   CHOL 172 02/28/2022   CHOL 159 11/19/2021   Lab Results  Component Value Date   HDL 60 06/10/2022   HDL 48 02/28/2022   HDL 53 11/19/2021   Lab Results  Component Value Date   LDLCALC 105 (H) 06/10/2022   LDLCALC 96 02/28/2022   LDLCALC 86 11/19/2021   Lab Results  Component Value Date   TRIG 84 06/10/2022   TRIG 158 (H) 02/28/2022   TRIG 108 11/19/2021   Lab Results  Component Value Date   CHOLHDL 3.0 06/10/2022   CHOLHDL 3.6 02/28/2022   CHOLHDL 3.0 11/19/2021   No results found for: "LDLDIRECT" Last vitamin D No results found for: "25OHVITD2", "25OHVITD3", "VD25OH" Lab Results  Component Value Date   TSH 1.370 11/19/2021  -Controlled -Current treatment: aspirin ec 81 mg daily Appropriate, Effective, Safe, Accessible Ezetimibe 10mg  Appropriate, Effective, Safe, Accessible -Medications previously tried: brand name Crestor, atorvastatin -Current dietary patterns: reports breakfast meat and hot dogs  -Current exercise habits: was previously involved in karate and exercise class prior to COVID -Educated  on Cholesterol goals;  Benefits of statin for ASCVD risk reduction; Importance of limiting foods high in cholesterol; Exercise goal of 150 minutes per week; Jan 2023: Recommended considering Crestor 10 mg three times weekly. Patient reports she can tolerate brand name Crestor but the generic leaves her achy. Pharmacist to request Dispense as Written for brand name Crestor.  Assessed patient finances. Patient approved for [employer] to cover cost of Crestor.  July 2023: Patient states she stopped Rosuvastatin once she started Ezetimibe. Still on medslist, will ask PCP about removing. August 2023: PCP removed  statin from medslist   Diabetes (A1c goal <8%) Lab Results  Component Value Date   HGBA1C 7.6 (H) 06/10/2022   HGBA1C 7.4 (H) 02/28/2022   HGBA1C 7.2 (H) 11/19/2021   Lab Results  Component Value Date   MICROALBUR 10 12/25/2020   LDLCALC 105 (H) 06/10/2022   CREATININE 0.88 06/10/2022    Lab Results  Component Value Date   NA 140 06/10/2022   K 3.7 06/10/2022   CREATININE 0.88 06/10/2022   EGFR 68 06/10/2022   GFRNONAA >60 09/14/2008   GLUCOSE 109 (H) 06/10/2022    Lab Results  Component Value Date   WBC 5.2 06/10/2022   HGB 13.0 06/10/2022   HCT 38.8 06/10/2022   MCV 90 06/10/2022   PLT 323 06/10/2022    Lab Results  Component Value Date   LABMICR 5.6 08/06/2021   MICROALBUR 10 12/25/2020   -Controlled -Current medications: Metformin 850mg  twice daily Appropriate, Effective, Safe, Accessible One touch verio test strips twice daily  Lancets daily prior to meal  -Medications previously tried: Metformin 500mg  3/day (ADR's) -Current home glucose readings fasting glucose:  May 2022: 120, 121, 136, 156, 124, 128, 116, 128, 164 (birthday cake), 143, 168 (birthday cake) Jan 2023: Didn't have readings on her July 2023: Didn't have readings on her August 2023: 02/11/22: 132 02/09/22: 159 @ 0800, 206 @ 2130, 206 @ 2200 02/08/22: 142 @ 1100, 234 @ 1800,   02/07/22: 142 @ 0830, 171 @ 1900 Feb 2024: 08/12/22: 127 - (193 Post food) - 165 08/11/22: 138, 116 08/10/22: 124, 144 March 2024: Hasn't started new Metformin dose April 2024: 09/29/22: 145 138 90 116 125 136 149 127 136 147  180 June 2024: (Morning fasting, PM is post food) 11/21/22: 130, 220 11/24/24: 125, 124 11/26/22: 112,  11/27/22: 134, 115 11/28/22: 131 11/29/22: 127, 176 11/30/22: 117, 158 12/01/22: 113, 137 12/02/22: 116 12/03/22: 131 12/04/22: 137 -Denies hypoglycemic/hyperglycemic symptoms -Current meal patterns:  Late breakfast or early lunch: egg, bacon or sausage with grits or pancake occasionally (a few times a month). Vilma Meckel Croissant once a week as a treat.   dinner: dines out mainly.  snacks:  more birthday cake lately since celebrating all month long),  drinks: unsweet tea, coffee  -Current exercise: limited since not attending exercise class after COVID -Educated on A1c and blood sugar goals; Complications of diabetes including kidney damage, retinal damage, and cardiovascular disease; Exercise goal of 150 minutes per week; Benefits of routine self-monitoring of blood sugar; Carbohydrate counting and/or plate method -Counseled to check feet daily and get yearly eye exams -Counseled on diet and exercise extensively Recommended to continue current medication Jan 2023: Patient doesn't want to increase Metformin to 3/day nor does she want to start Ozempic. Updated medslist and will let PCP know August 2023: Sugars looking better. Counseled to test sugars before eating, not after Feb 2024: Patient prescribed Xigduo a few months ago but was too expensive so she didn't start. Does have PAP but she had concerns about it. She told me, "Can we do PAP for Ozempic instead? I was given that before and I've never felt better but someone told me there was no PAP." I explained to her there was a shortage last year but we're able to do PAP now. Will ask Rekha if we can change Xigduo  to Ozempic + Metformin per patient's wishes. Sent direct msg to PCP March 2024:  -Patient hasn't started new Metformin dose. Counseled to start -Ozempic: Patient hasn't returned PAP because  she was concerned about them looking into her finances. She agreed to submit after we spoke about it April 2024: Counseled patient to bring in PAP June 2024: Patient doesn't want to take Ozempic, will DC      Disc Degeneration (Goal: manage pain) -Pain Scale Jan 2023: Patient didn't specify, only said she is very content August 2023: Didn't specify pain scale, just that she feels much better Feb 2024: Didn't specify pain scale. Stated had Shoulder Sx last month but feels much better -Controlled -Current treatment  Tramadol 50 mg bid prn Appropriate, Effective, Safe, Accessible Acetaminophen Appropriate, Effective, Safe, Accessible Diclofenac gel Appropriate, Effective, Safe, Accessible -Medications previously tried: IBU, Meloxicam (BP rises) July 2023: IBU was on patients meds list and Meloxicam wasn't. Patient states she has both but that she doesn't take both. Asked her which she prefers and that we should stick with one. She called back and said she prefers Meloxicam. Updated Medslist and once again counseled not to take together August 2023: Patient very happy with PT, states is helping pain greatly Feb 2024: Counseled once again to not take IBU. She states she hasn't. She asked me about Tylenol and I explained she could take Acetaminophen/Tylenol but to get the generic and spent time counseling on 3g/day MDD May 2024: Patient stopped Meloxicam due to it bumping up her BP every time she takes it June 2024: States diclofenac gel sample helped greatly. Will send in prescription but also told patient about OTC Generic  CPP F/U December 2024  Artelia Laroche, [place].D. - [phone]

## 2022-12-08 DIAGNOSIS — M75101 Unspecified rotator cuff tear or rupture of right shoulder, not specified as traumatic: Secondary | ICD-10-CM | POA: Diagnosis not present

## 2022-12-08 DIAGNOSIS — M25511 Pain in right shoulder: Secondary | ICD-10-CM | POA: Diagnosis not present

## 2022-12-09 ENCOUNTER — Ambulatory Visit: Payer: Medicare Other | Admitting: Physician Assistant

## 2022-12-18 DIAGNOSIS — E1169 Type 2 diabetes mellitus with other specified complication: Secondary | ICD-10-CM | POA: Diagnosis not present

## 2022-12-18 DIAGNOSIS — Z713 Dietary counseling and surveillance: Secondary | ICD-10-CM | POA: Diagnosis not present

## 2022-12-26 ENCOUNTER — Ambulatory Visit: Payer: Medicare Other | Admitting: Family Medicine

## 2023-01-08 ENCOUNTER — Other Ambulatory Visit: Payer: Self-pay | Admitting: Family Medicine

## 2023-01-17 ENCOUNTER — Other Ambulatory Visit: Payer: Self-pay | Admitting: Family Medicine

## 2023-01-26 ENCOUNTER — Other Ambulatory Visit: Payer: Self-pay

## 2023-01-26 DIAGNOSIS — E1159 Type 2 diabetes mellitus with other circulatory complications: Secondary | ICD-10-CM

## 2023-01-26 DIAGNOSIS — E1169 Type 2 diabetes mellitus with other specified complication: Secondary | ICD-10-CM

## 2023-01-26 DIAGNOSIS — E782 Mixed hyperlipidemia: Secondary | ICD-10-CM

## 2023-01-29 ENCOUNTER — Other Ambulatory Visit: Payer: Medicare Other

## 2023-01-29 DIAGNOSIS — E782 Mixed hyperlipidemia: Secondary | ICD-10-CM | POA: Diagnosis not present

## 2023-01-29 DIAGNOSIS — E1159 Type 2 diabetes mellitus with other circulatory complications: Secondary | ICD-10-CM | POA: Diagnosis not present

## 2023-01-29 DIAGNOSIS — I152 Hypertension secondary to endocrine disorders: Secondary | ICD-10-CM | POA: Diagnosis not present

## 2023-01-29 DIAGNOSIS — E1169 Type 2 diabetes mellitus with other specified complication: Secondary | ICD-10-CM | POA: Diagnosis not present

## 2023-02-03 ENCOUNTER — Ambulatory Visit: Payer: Medicare Other | Admitting: Family Medicine

## 2023-02-03 VITALS — BP 126/70 | HR 78 | Temp 96.5°F | Resp 18 | Ht 59.5 in | Wt 144.4 lb

## 2023-02-03 DIAGNOSIS — I10 Essential (primary) hypertension: Secondary | ICD-10-CM

## 2023-02-03 DIAGNOSIS — M25562 Pain in left knee: Secondary | ICD-10-CM

## 2023-02-03 DIAGNOSIS — E1169 Type 2 diabetes mellitus with other specified complication: Secondary | ICD-10-CM | POA: Diagnosis not present

## 2023-02-03 DIAGNOSIS — G8929 Other chronic pain: Secondary | ICD-10-CM | POA: Diagnosis not present

## 2023-02-03 DIAGNOSIS — T466X5A Adverse effect of antihyperlipidemic and antiarteriosclerotic drugs, initial encounter: Secondary | ICD-10-CM | POA: Diagnosis not present

## 2023-02-03 DIAGNOSIS — G4733 Obstructive sleep apnea (adult) (pediatric): Secondary | ICD-10-CM

## 2023-02-03 DIAGNOSIS — M25561 Pain in right knee: Secondary | ICD-10-CM | POA: Diagnosis not present

## 2023-02-03 DIAGNOSIS — Z1159 Encounter for screening for other viral diseases: Secondary | ICD-10-CM | POA: Diagnosis not present

## 2023-02-03 DIAGNOSIS — M797 Fibromyalgia: Secondary | ICD-10-CM

## 2023-02-03 DIAGNOSIS — E782 Mixed hyperlipidemia: Secondary | ICD-10-CM | POA: Diagnosis not present

## 2023-02-03 MED ORDER — ROSUVASTATIN CALCIUM 5 MG PO TABS: 5.0000 mg | ORAL_TABLET | Freq: Every day | ORAL | 0 refills | Status: DC

## 2023-02-03 NOTE — Patient Instructions (Addendum)
Debrox in each ear for the next 5 days. Lab draw today.   Need Tdap and shingrix at the pharmacy.

## 2023-02-03 NOTE — Progress Notes (Unsigned)
Subjective:  Patient ID: Mindy Ingram, female    DOB: 07-08-1945  Age: 77 y.o. MRN: [id number]  Chief Complaint  Patient presents with   Medical Management of Chronic Issues    HPI  Diabetes:  Not well controlled Glucose checking: regularly Glucose logs:Blood sugar runs between 99-133  Hypoglycemia: no  Most recent A1C: 6.6 Current medications: Metformin XR 850 mg Twice a day. Last Eye Exam: Appointment is scheduled for tomorrow.   Foot checks: daily  Hyperlipidemia: Current medications: Zetia 10 mg daily, Fish Oil 1000 mg daily. Intolerant to statins.  GERD: Taking Omeprazole 20 mg daily.  Hypertension: Current medications: Amlodipine 5 mg daily, Aspirin 81 mg daily, Metoprolol 200 mg every day, Losartan-Hctz 100-25 mg daily.  OSA: Sees Dr. Maple Hudson. Home sleep study ordered on 05/27/2022. I do not see the results.  She has continued on CPAP.      02/03/2023   11:26 AM 05/26/2022    1:02 PM 02/28/2022    9:20 AM 08/06/2021   10:12 AM 05/14/2021    3:43 PM  Depression screen PHQ 2/9  Decreased Interest 0 0 0 0 0  Down, Depressed, Hopeless 0 0 0 0 0  PHQ - 2 Score 0 0 0 0 0  Altered sleeping 0      Tired, decreased energy 0      Change in appetite 0      Feeling bad or failure about yourself  0      Trouble concentrating 0      Moving slowly or fidgety/restless 0      Suicidal thoughts 0      PHQ-9 Score 0      Difficult doing work/chores Somewhat difficult            02/03/2023   11:26 AM  Fall Risk   Falls in the past year? 0  Number falls in past yr: 0  Injury with Fall? 0  Risk for fall due to : No Fall Risks  Follow up Falls evaluation completed;Follow up appointment    Patient Care Team: Blane Ohara, MD as PCP - General (Family Medicine) Webb Silversmith, MD (Gastroenterology) Donnetta Hail, MD as Consulting Physician (Rheumatology) Margarette Canada., DPM (Podiatry) Mia Creek, MD as Consulting Physician (Ophthalmology) Rogelio Seen, OD  (Optometry)   Review of Systems  Constitutional:  Negative for chills, fatigue and fever.  HENT:  Positive for hearing loss. Negative for congestion, ear pain and sore throat.   Respiratory:  Negative for cough and shortness of breath.   Cardiovascular:  Negative for chest pain.  Gastrointestinal:  Positive for diarrhea. Negative for abdominal pain, constipation, nausea and vomiting.  Genitourinary:  Negative for dysuria and urgency.  Musculoskeletal:  Positive for arthralgias (bilateral knee pain) and myalgias.  Skin:  Negative for rash.  Neurological:  Negative for dizziness and headaches.  Psychiatric/Behavioral:  Negative for dysphoric mood. The patient is not nervous/anxious.     Current Outpatient Medications on File Prior to Visit  Medication Sig Dispense Refill   diclofenac Sodium (VOLTAREN) 1 % GEL Apply 4 g topically 4 (four) times daily as needed (joint pain). 350 g 2   acyclovir (ZOVIRAX) 400 MG tablet Take 1 tablet (400 mg total) by mouth 2 (two) times daily as needed (outbreak). 100 tablet 3   amLODipine (NORVASC) 5 MG tablet TAKE ONE-HALF TABLET BY MOUTH  DAILY 100 tablet 0   aspirin EC 81 MG tablet Take 81 mg by mouth daily.  azelastine (ASTELIN) 0.1 % nasal spray Place 1 spray into both nostrils 2 (two) times daily as needed for allergies or rhinitis.     Blood Glucose Monitoring Suppl (ONE TOUCH ULTRA 2) w/Device KIT Use as instructed five times daily. E11.69 (Patient taking differently: 1 each as directed. Use as instructed four  times daily. E11.69) 1 kit 0   cetirizine (ZYRTEC) 10 MG tablet Take 1 tablet (10 mg total) by mouth daily as needed for allergies. 90 tablet 3   Cholecalciferol (VITAMIN D PO) Take 2,000 Units by mouth daily.      Elderberry 575 MG/5ML SYRP Take 5 mLs by mouth daily.     ezetimibe (ZETIA) 10 MG tablet TAKE 1 TABLET BY MOUTH DAILY 100 tablet 0   fluticasone (FLONASE) 50 MCG/ACT nasal spray USE 1 SPRAY IN EACH NOSTRIL EVERY DAY (Patient taking  differently: Place 1 spray into both nostrils in the morning and at bedtime.) 48 g 1   Glucose Blood (BLOOD GLUCOSE TEST STRIPS 333) STRP 100 each by In Vitro route 3 (three) times daily before meals. 300 strip 3   Lancets 30G MISC 3 Boxes by Does not apply route every 4 (four) hours as needed. 100 each 2   losartan-hydrochlorothiazide (HYZAAR) 100-25 MG tablet TAKE 1 TABLET BY MOUTH DAILY 100 tablet 0   methocarbamol (ROBAXIN) 500 MG tablet Take 1 tablet (500 mg total) by mouth 2 (two) times daily. 180 tablet 0   metoprolol (TOPROL-XL) 200 MG 24 hr tablet TAKE 1 TABLET(200 MG) BY MOUTH DAILY 90 tablet 0   Multiple Vitamins-Minerals (COMPLETE ENERGY) TABS Take 1 tablet by mouth daily.     nitroGLYCERIN (NITROSTAT) 0.4 MG SL tablet Place 1 tablet (0.4 mg total) under the tongue every 5 (five) minutes as needed for chest pain. 30 tablet 4   Omega-3 Fatty Acids (FISH OIL) 1000 MG CAPS Take 1 capsule by mouth daily.     omeprazole (PRILOSEC) 20 MG capsule TAKE 1 CAPSULE BY MOUTH DAILY 100 capsule 2   traMADol (ULTRAM) 50 MG tablet Take 50 mg by mouth 2 (two) times daily as needed for pain.     triamcinolone ointment (KENALOG) 0.1 % Apply 1 application topically 2 (two) times daily. 80 g 0   Turmeric (QC TUMERIC COMPLEX PO) Take 1,000 mg by mouth daily. Every other day     Current Facility-Administered Medications on File Prior to Visit  Medication Dose Route Frequency Provider Last Rate Last Admin   triamcinolone acetonide (KENALOG-40) injection 60 mg  60 mg Intramuscular Once Marianne Sofia, PA-C       Past Medical History:  Diagnosis Date   Allergic asthma, mild intermittent, uncomplicated 10/17/2007   Office Spirometry 04/03/2015- WNL FVC 2.14/110%, FEV1 1.94/128%, FEV1/FVC 0.91  Office spirometry- 11/04/16--by allergy office- FEV1/FVC 0.77 WNL   Bilateral impacted cerumen 07/01/2018   BMI 29.0-29.9,adult 06/10/2022   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   Cervical radicular pain  01/20/2022   Chronic back pain    Contusion of head, initial encounter 03/18/2021   Corn of toe 06/25/2020   Degeneration of lumbosacral intervertebral disc 01/26/2012   DJD (degenerative joint disease)    DM type 2 with diabetic mixed hyperlipidemia (HCC) 04/08/2021   Dyslipidemia 01/23/2015   Essential hypertension 01/23/2015   Fibromyalgia    GERD 10/17/2007   Qualifier: Diagnosis of  By: Maple Hudson MD, [place] D    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   Hyperlipidemia 06/25/2020   Hypertension associated with diabetes (  HCC) 01/23/2015   Lumbar facet arthropathy 01/20/2022   Lumbar spondylolysis 02/10/2012   Menopause 06/25/2020   Metatarsalgia of both feet 09/18/2016   Multiple benign melanocytic nevi 06/25/2020   Myalgia 03/03/2022   Myalgia due to statin 03/03/2022   Neck pain 12/12/2021   Obesity 07/10/2021   OSA (obstructive sleep apnea)    cpap setting of 10   Pain 03/24/2018   Preop cardiovascular exam 06/10/2022   Right rotator cuff tear 06/25/2020   Right temporal frontal scalp contusions 03/18/2021   S/P lumbar fusion 03/16/2012   Seasonal and perennial allergic rhinitis 10/17/2007   Allergy vaccine 2002- dc'd   Shingles    Shoulder joint pain 06/25/2020   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   Statin myopathy 06/10/2022   Thoracic spine pain 02/23/2013   Type II or unspecified type diabetes mellitus without mention of complication, not stated as uncontrolled    Past Surgical History:  Procedure Laterality Date   APPENDECTOMY  1995   BACK SURGERY  2013   lower back with plates and screws   benign lump right axilla     BILATERAL SALPINGOOPHORECTOMY  1995   CERVICAL SPINE SURGERY  1992   at [facility], slight limitation with turning neck   CHOLECYSTECTOMY  1995   EUS N/A 07/21/2013   Procedure: UPPER ENDOSCOPIC ULTRASOUND (EUS) LINEAR;  Surgeon: Rachael Fee, MD;  Location: [facility];  Service: Endoscopy;  Laterality: N/A;    FOOT SURGERY     2007 - right great toe, 2000 - right fifth digit .   NEUROPLASTY / TRANSPOSITION MEDIAN NERVE AT CARPAL TUNNEL BILATERAL  1995   PARTIAL COLECTOMY  1995   benign adhesions   PARTIAL HYSTERECTOMY  1980s.   abdominal   right foot surgery  2000   right great toe with artificial bone inserted   right knee arthroscopy Right 02/2017    Family History  Problem Relation Age of Onset   Heart disease Mother    Prostate cancer Father    Heart attack Sister    Breast cancer Maternal Aunt    Prostate cancer Paternal Uncle    Allergies Neg Hx    Asthma Neg Hx    Eczema Neg Hx    Immunodeficiency Neg Hx    Social History   Socioeconomic History   Marital status: Widowed    Spouse name: Not on file   Number of children: 1   Years of education: Not on file   Highest education level: Not on file  Occupational History   Occupation: Retired  Tobacco Use   Smoking status: Never   Smokeless tobacco: Never  Vaping Use   Vaping status: Never Used  Substance and Sexual Activity   Alcohol use: No   Drug use: No   Sexual activity: Not on file  Other Topics Concern   Not on file  Social History Narrative   Not on file   Social Determinants of Health   Financial Resource Strain: High Risk (12/04/2022)   Overall Financial Resource Strain (CARDIA)    Difficulty of Paying Living Expenses: Hard  Food Insecurity: No Food Insecurity (05/26/2022)   Hunger Vital Sign    Worried About Running Out of Food in the Last Year: Never true    Ran Out of Food in the Last Year: Never true  Transportation Needs: No Transportation Needs (08/13/2022)   PRAPARE - Transportation    Lack of Transportation (Medical): No    Lack of  Transportation (Non-Medical): No  Physical Activity: Insufficiently Active (05/26/2022)   Exercise Vital Sign    Days of Exercise per Week: 2 days    Minutes of Exercise per Session: 40 min  Stress: No Stress Concern Present (05/26/2022)   [employer] of  Occupational Health - Occupational Stress Questionnaire    Feeling of Stress : Not at all  Social Connections: Moderately Isolated (05/26/2022)   Social Connection and Isolation Panel [NHANES]    Frequency of Communication with Friends and Family: More than three times a week    Frequency of Social Gatherings with Friends and Family: More than three times a week    Attends Religious Services: More than 4 times per year    Active Member of [employer] or Organizations: No    Attends Banker Meetings: Never    Marital Status: Widowed    Objective:  BP 126/70   Pulse 78   Temp (!) 96.5 F (35.8 C)   Resp 18   Ht 4' 11.5" (1.511 m)   Wt 144 lb 6.4 oz (65.5 kg)   BMI 28.68 kg/m      02/03/2023   11:12 AM 10/07/2022    1:15 PM 06/10/2022    9:23 AM  BP/Weight  Systolic BP 126 118 118  Diastolic BP 70 72 62  Wt. (Lbs) 144.4 147.8 148.5  BMI 28.68 kg/m2 29.25 kg/m2 29.99 kg/m2    Physical Exam Vitals reviewed.  Constitutional:      Appearance: Normal appearance. She is normal weight.  Neck:     Vascular: No carotid bruit.  Cardiovascular:     Rate and Rhythm: Normal rate and regular rhythm.     Heart sounds: Normal heart sounds.  Pulmonary:     Effort: Pulmonary effort is normal. No respiratory distress.     Breath sounds: Normal breath sounds.  Abdominal:     General: Abdomen is flat. Bowel sounds are normal.     Palpations: Abdomen is soft.     Tenderness: There is no abdominal tenderness.  Neurological:     Mental Status: She is alert and oriented to person, place, and time.  Psychiatric:        Mood and Affect: Mood normal.        Behavior: Behavior normal.     Diabetic Foot Exam - Simple   Simple Foot Form Diabetic Foot exam was performed with the following findings: Yes 02/03/2023 11:39 AM  Visual Inspection See comments: Yes Sensation Testing Intact to touch and monofilament testing bilaterally: Yes Pulse Check Posterior Tibialis and Dorsalis pulse  intact bilaterally: Yes Comments Bunion left foot.       Lab Results  Component Value Date   WBC 4.3 01/29/2023   HGB 12.5 01/29/2023   HCT 38.8 01/29/2023   PLT 303 01/29/2023   GLUCOSE 110 (H) 01/29/2023   CHOL 153 01/29/2023   TRIG 126 01/29/2023   HDL 45 01/29/2023   LDLCALC 86 01/29/2023   ALT 15 01/29/2023   AST 19 01/29/2023   NA 143 01/29/2023   K 3.8 01/29/2023   CL 101 01/29/2023   CREATININE 1.02 (H) 01/29/2023   BUN 18 01/29/2023   CO2 24 01/29/2023   TSH 1.370 11/19/2021   INR 0.9 06/10/2022   HGBA1C 6.6 (H) 01/29/2023   MICROALBUR 10 12/25/2020      Assessment & Plan:    Need for hepatitis C screening test -     Hepatitis C antibody  Chronic pain of both  knees Assessment & Plan: Referring Orthopedic surgery.  Orders: -     Ambulatory referral to Orthopedic Surgery  DM type 2 with diabetic mixed hyperlipidemia (HCC) Assessment & Plan: Control:  not well controlled Recommend check sugars fasting daily and running between 150-200 Recommend check feet daily. Recommend annual eye exams. Medicines: Metformin 850 XR mg Twice a day.  Continue to work on eating a healthy diet and exercise.  Labs drawn today.    Orders: -     Microalbumin / creatinine urine ratio  Other orders -     Rosuvastatin Calcium; Take 1 tablet (5 mg total) by mouth daily.  Dispense: 90 tablet; Refill: 0     Meds ordered this encounter  Medications   rosuvastatin (CRESTOR) 5 MG tablet    Sig: Take 1 tablet (5 mg total) by mouth daily.    Dispense:  90 tablet    Refill:  0    Orders Placed This Encounter  Procedures   Microalbumin/Creatinine Ratio, Urine   Hepatitis C antibody   Ambulatory referral to Orthopedic Surgery     Follow-up: Return in about 1 week (around 02/10/2023) for Nurse visit (Ear wax irrigation),.   I,Zayon Trulson,acting as a scribe for Blane Ohara, MD.,have documented all relevant documentation on the behalf of Blane Ohara, MD,as directed by   Blane Ohara, MD while in the presence of Blane Ohara, MD.   Clayborn Bigness I Leal-Borjas,acting as a scribe for Blane Ohara, MD.,have documented all relevant documentation on the behalf of Blane Ohara, MD,as directed by  Blane Ohara, MD while in the presence of Blane Ohara, MD.    An After Visit Summary was printed and given to the patient.  Blane Ohara, MD [facility] [phone]

## 2023-02-04 DIAGNOSIS — H2703 Aphakia, bilateral: Secondary | ICD-10-CM | POA: Diagnosis not present

## 2023-02-04 DIAGNOSIS — E119 Type 2 diabetes mellitus without complications: Secondary | ICD-10-CM | POA: Diagnosis not present

## 2023-02-04 LAB — HM DIABETES EYE EXAM

## 2023-02-05 DIAGNOSIS — Z713 Dietary counseling and surveillance: Secondary | ICD-10-CM | POA: Diagnosis not present

## 2023-02-05 DIAGNOSIS — E1169 Type 2 diabetes mellitus with other specified complication: Secondary | ICD-10-CM | POA: Diagnosis not present

## 2023-02-05 DIAGNOSIS — E782 Mixed hyperlipidemia: Secondary | ICD-10-CM | POA: Diagnosis not present

## 2023-02-06 ENCOUNTER — Other Ambulatory Visit: Payer: Self-pay

## 2023-02-06 MED ORDER — METFORMIN HCL 500 MG PO TABS: 500.0000 mg | ORAL_TABLET | Freq: Two times a day (BID) | ORAL | 0 refills | Status: DC

## 2023-02-06 NOTE — Telephone Encounter (Signed)
[name] (diabetic educator at [facility]) called with concerns about Mindy Ingram feeling light-headed and weak.  Mrs. Serrette has been working very hard on her diet recently and she feels that her Metformin dose needs to be decreased.  Dr. Sedalia Muta advised that her dose be decreased to 500 mg twice daily.

## 2023-02-07 DIAGNOSIS — G8929 Other chronic pain: Secondary | ICD-10-CM | POA: Insufficient documentation

## 2023-02-07 HISTORY — DX: Other chronic pain: G89.29

## 2023-02-07 NOTE — Assessment & Plan Note (Addendum)
Control:  diabetes and cholesterol well controlled Recommend check sugars fasting daily and running between 150-200 Recommend check feet daily. Recommend annual eye exams. Medicines: Metformin 850 XR mg Twice a day. Start on crestor 5 mg before bed.  Continue to work on eating a healthy diet and exercise.  Labs reviewed today.

## 2023-02-07 NOTE — Assessment & Plan Note (Signed)
Referring Orthopedic surgery.

## 2023-02-08 ENCOUNTER — Encounter: Payer: Self-pay | Admitting: Family Medicine

## 2023-02-08 DIAGNOSIS — Z1159 Encounter for screening for other viral diseases: Secondary | ICD-10-CM | POA: Insufficient documentation

## 2023-02-08 HISTORY — DX: Encounter for screening for other viral diseases: Z11.59

## 2023-02-08 NOTE — Assessment & Plan Note (Signed)
Continue cpap.  

## 2023-02-08 NOTE — Assessment & Plan Note (Signed)
Trial on low dose crestor.

## 2023-02-08 NOTE — Assessment & Plan Note (Signed)
The current medical regimen is effective;  continue present plan and medications. Continue Amlodipine 5mg  take 1/2 tablet daily, Losartan/HYDROCHLOROTHIAZIDE 100-25mg  take 1 tablet by mouth daily, Metoprolol 200mg  1 tablet daily. Check labs.

## 2023-02-08 NOTE — Assessment & Plan Note (Signed)
Continue robaxin.

## 2023-02-09 ENCOUNTER — Telehealth (INDEPENDENT_AMBULATORY_CARE_PROVIDER_SITE_OTHER): Payer: Medicare Other | Admitting: Physician Assistant

## 2023-02-09 ENCOUNTER — Encounter: Payer: Self-pay | Admitting: Physician Assistant

## 2023-02-09 DIAGNOSIS — J069 Acute upper respiratory infection, unspecified: Secondary | ICD-10-CM

## 2023-02-09 DIAGNOSIS — U071 COVID-19: Secondary | ICD-10-CM

## 2023-02-09 HISTORY — DX: Acute upper respiratory infection, unspecified: J06.9

## 2023-02-09 HISTORY — DX: COVID-19: U07.1

## 2023-02-09 LAB — POC COVID19 BINAXNOW: SARS Coronavirus 2 Ag: POSITIVE — AB

## 2023-02-09 MED ORDER — NIRMATRELVIR/RITONAVIR (PAXLOVID) TABLET (RENAL DOSING): 2.0000 | ORAL_TABLET | Freq: Two times a day (BID) | ORAL | 0 refills | Status: AC

## 2023-02-09 NOTE — Assessment & Plan Note (Signed)
Patient will take Paxlovid 10 X 150MG  & 10 X 100MG  for renal precautions Patient will quarantine for 3 days and wear a mask when going out for the next week Patient will let us know if symptoms persist or change after 1 week.

## 2023-02-09 NOTE — Progress Notes (Deleted)
Acute Office Visit  Subjective:    Patient ID: Mindy Ingram, female    DOB: 12/05/1945, 77 y.o.   MRN: [id number]  No chief complaint on file.   HPI: Patient is in today with complains of   Upper respiratory symptoms She complains of {uri sx's' brief:15453}.with {systemic_sx:15294}. Onset of symptoms was {onset initial:119223} and {progression:119226}.She {hydration history:15378}.  Past history is significant for {respiratory illness:412}. Patient is {smoker?:15292}   Past Medical History:  Diagnosis Date   Allergic asthma, mild intermittent, uncomplicated 10/17/2007   Office Spirometry 04/03/2015- WNL FVC 2.14/110%, FEV1 1.94/128%, FEV1/FVC 0.91  Office spirometry- 11/04/16--by allergy office- FEV1/FVC 0.77 WNL   Bilateral impacted cerumen 07/01/2018   BMI 29.0-29.9,adult 06/10/2022   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   Cervical radicular pain 01/20/2022   Chronic back pain    Contusion of head, initial encounter 03/18/2021   Corn of toe 06/25/2020   Degeneration of lumbosacral intervertebral disc 01/26/2012   DJD (degenerative joint disease)    DM type 2 with diabetic mixed hyperlipidemia (HCC) 04/08/2021   Dyslipidemia 01/23/2015   Essential hypertension 01/23/2015   Fibromyalgia    GERD 10/17/2007   Qualifier: Diagnosis of  By: Maple Hudson MD, Clinton D    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   Hyperlipidemia 06/25/2020   Hypertension associated with diabetes (HCC) 01/23/2015   Lumbar facet arthropathy 01/20/2022   Lumbar spondylolysis 02/10/2012   Menopause 06/25/2020   Metatarsalgia of both feet 09/18/2016   Multiple benign melanocytic nevi 06/25/2020   Myalgia 03/03/2022   Myalgia due to statin 03/03/2022   Neck pain 12/12/2021   Obesity 07/10/2021   OSA (obstructive sleep apnea)    cpap setting of 10   Pain 03/24/2018   Preop cardiovascular exam 06/10/2022   Right rotator cuff tear 06/25/2020   Right temporal frontal scalp contusions 03/18/2021    S/P lumbar fusion 03/16/2012   Seasonal and perennial allergic rhinitis 10/17/2007   Allergy vaccine 2002- dc'd   Shingles    Shoulder joint pain 06/25/2020   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   Statin myopathy 06/10/2022   Thoracic spine pain 02/23/2013   Type II or unspecified type diabetes mellitus without mention of complication, not stated as uncontrolled     Past Surgical History:  Procedure Laterality Date   APPENDECTOMY  1995   BACK SURGERY  2013   lower back with plates and screws   benign lump right axilla     BILATERAL SALPINGOOPHORECTOMY  1995   CERVICAL SPINE SURGERY  1992   at [facility], slight limitation with turning neck   CHOLECYSTECTOMY  1995   EUS N/A 07/21/2013   Procedure: UPPER ENDOSCOPIC ULTRASOUND (EUS) LINEAR;  Surgeon: Rachael Fee, MD;  Location: [facility];  Service: Endoscopy;  Laterality: N/A;   FOOT SURGERY     2007 - right great toe, 2000 - right fifth digit .   NEUROPLASTY / TRANSPOSITION MEDIAN NERVE AT CARPAL TUNNEL BILATERAL  1995   PARTIAL COLECTOMY  1995   benign adhesions   PARTIAL HYSTERECTOMY  1980s.   abdominal   right foot surgery  2000   right great toe with artificial bone inserted   right knee arthroscopy Right 02/2017    Family History  Problem Relation Age of Onset   Heart disease Mother    Prostate cancer Father    Heart attack Sister    Breast cancer Maternal Aunt    Prostate cancer Paternal Uncle  Allergies Neg Hx    Asthma Neg Hx    Eczema Neg Hx    Immunodeficiency Neg Hx     Social History   Socioeconomic History   Marital status: Widowed    Spouse name: Not on file   Number of children: 1   Years of education: Not on file   Highest education level: Not on file  Occupational History   Occupation: Retired  Tobacco Use   Smoking status: Never   Smokeless tobacco: Never  Vaping Use   Vaping status: Never Used  Substance and Sexual Activity   Alcohol use: No    Drug use: No   Sexual activity: Not on file  Other Topics Concern   Not on file  Social History Narrative   Not on file   Social Determinants of Health   Financial Resource Strain: High Risk (12/04/2022)   Overall Financial Resource Strain (CARDIA)    Difficulty of Paying Living Expenses: Hard  Food Insecurity: No Food Insecurity (05/26/2022)   Hunger Vital Sign    Worried About Running Out of Food in the Last Year: Never true    Ran Out of Food in the Last Year: Never true  Transportation Needs: No Transportation Needs (08/13/2022)   PRAPARE - Administrator, Civil Service (Medical): No    Lack of Transportation (Non-Medical): No  Physical Activity: Insufficiently Active (05/26/2022)   Exercise Vital Sign    Days of Exercise per Week: 2 days    Minutes of Exercise per Session: 40 min  Stress: No Stress Concern Present (05/26/2022)   [employer] of Occupational Health - Occupational Stress Questionnaire    Feeling of Stress : Not at all  Social Connections: Moderately Isolated (05/26/2022)   Social Connection and Isolation Panel [NHANES]    Frequency of Communication with Friends and Family: More than three times a week    Frequency of Social Gatherings with Friends and Family: More than three times a week    Attends Religious Services: More than 4 times per year    Active Member of [employer] or Organizations: No    Attends Banker Meetings: Never    Marital Status: Widowed  Intimate Partner Violence: Not At Risk (05/26/2022)   Humiliation, Afraid, Rape, and Kick questionnaire    Fear of Current or Ex-Partner: No    Emotionally Abused: No    Physically Abused: No    Sexually Abused: No    Outpatient Medications Prior to Visit  Medication Sig Dispense Refill   diclofenac Sodium (VOLTAREN) 1 % GEL Apply 4 g topically 4 (four) times daily as needed (joint pain). 350 g 2   acyclovir (ZOVIRAX) 400 MG tablet Take 1 tablet (400 mg total) by mouth 2  (two) times daily as needed (outbreak). 100 tablet 3   amLODipine (NORVASC) 5 MG tablet TAKE ONE-HALF TABLET BY MOUTH  DAILY 100 tablet 0   aspirin EC 81 MG tablet Take 81 mg by mouth daily.     azelastine (ASTELIN) 0.1 % nasal spray Place 1 spray into both nostrils 2 (two) times daily as needed for allergies or rhinitis.     Blood Glucose Monitoring Suppl (ONE TOUCH ULTRA 2) w/Device KIT Use as instructed five times daily. E11.69 (Patient taking differently: 1 each as directed. Use as instructed four  times daily. E11.69) 1 kit 0   cetirizine (ZYRTEC) 10 MG tablet Take 1 tablet (10 mg total) by mouth daily as needed for allergies. 90 tablet 3  Cholecalciferol (VITAMIN D PO) Take 2,000 Units by mouth daily.      Elderberry 575 MG/5ML SYRP Take 5 mLs by mouth daily.     ezetimibe (ZETIA) 10 MG tablet TAKE 1 TABLET BY MOUTH DAILY 100 tablet 0   fluticasone (FLONASE) 50 MCG/ACT nasal spray USE 1 SPRAY IN EACH NOSTRIL EVERY DAY (Patient taking differently: Place 1 spray into both nostrils in the morning and at bedtime.) 48 g 1   Glucose Blood (BLOOD GLUCOSE TEST STRIPS 333) STRP 100 each by In Vitro route 3 (three) times daily before meals. 300 strip 3   Lancets 30G MISC 3 Boxes by Does not apply route every 4 (four) hours as needed. 100 each 2   losartan-hydrochlorothiazide (HYZAAR) 100-25 MG tablet TAKE 1 TABLET BY MOUTH DAILY 100 tablet 0   metFORMIN (GLUCOPHAGE) 500 MG tablet Take 1 tablet (500 mg total) by mouth 2 (two) times daily with a meal. 180 tablet 0   methocarbamol (ROBAXIN) 500 MG tablet Take 1 tablet (500 mg total) by mouth 2 (two) times daily. 180 tablet 0   metoprolol (TOPROL-XL) 200 MG 24 hr tablet TAKE 1 TABLET(200 MG) BY MOUTH DAILY 90 tablet 0   Multiple Vitamins-Minerals (COMPLETE ENERGY) TABS Take 1 tablet by mouth daily.     nitroGLYCERIN (NITROSTAT) 0.4 MG SL tablet Place 1 tablet (0.4 mg total) under the tongue every 5 (five) minutes as needed for chest pain. 30 tablet 4    Omega-3 Fatty Acids (FISH OIL) 1000 MG CAPS Take 1 capsule by mouth daily.     omeprazole (PRILOSEC) 20 MG capsule TAKE 1 CAPSULE BY MOUTH DAILY 100 capsule 2   rosuvastatin (CRESTOR) 5 MG tablet Take 1 tablet (5 mg total) by mouth daily. 90 tablet 0   traMADol (ULTRAM) 50 MG tablet Take 50 mg by mouth 2 (two) times daily as needed for pain.     triamcinolone ointment (KENALOG) 0.1 % Apply 1 application topically 2 (two) times daily. 80 g 0   Turmeric (QC TUMERIC COMPLEX PO) Take 1,000 mg by mouth daily. Every other day     Facility-Administered Medications Prior to Visit  Medication Dose Route Frequency Provider Last Rate Last Admin   triamcinolone acetonide (KENALOG-40) injection 60 mg  60 mg Intramuscular Once Marianne Sofia, PA-C        Allergies  Allergen Reactions   Dairy Aid [Tilactase]     Hives, diarrhea   Lactose Anaphylaxis and Other (See Comments)   Atorvastatin Other (See Comments)    Severe myalgias   Hydrocodone-Acetaminophen     REACTION: sore throat with vicodin   Hydrocodone-Acetaminophen Other (See Comments)    sore throat with vicodin    Milk-Related Compounds Other (See Comments)   Mirabegron Other (See Comments)    Headache    Oxycontin [Oxycodone Hcl]     "makes me crazy"   Prochlorperazine Edisylate     REACTION: stroke-like symptoms with companzine   Rosuvastatin     Can take brand crestor-severe myalgias   Shellfish Allergy    Umeclidinium-Vilanterol Other (See Comments)    Lactose allergy   Percodan [Oxycodone-Aspirin] Palpitations    Fast heartbeat with percodan    Review of Systems     Objective:        02/03/2023   11:12 AM 10/07/2022    1:15 PM 06/10/2022    9:23 AM  Vitals with BMI  Height 4' 11.5" 4' 11.6" 4\' 11"   Weight 144 lbs 6 oz 147 lbs 13  oz 148 lbs 8 oz  BMI 28.69 29.25 29.98  Systolic 126 118 409  Diastolic 70 72 62  Pulse 78 72 73    No data found.   Physical Exam  Health Maintenance Due  Topic Date Due   Zoster  Vaccines- Shingrix (1 of 2) 11/07/1964   DTaP/Tdap/Td (3 - Td or Tdap) 01/15/2022   COVID-19 Vaccine (5 - 2023-24 season) 02/28/2022   Colonoscopy  06/12/2022   INFLUENZA VACCINE  01/29/2023    There are no preventive care reminders to display for this patient.   Lab Results  Component Value Date   TSH 1.370 11/19/2021   Lab Results  Component Value Date   WBC 4.3 01/29/2023   HGB 12.5 01/29/2023   HCT 38.8 01/29/2023   MCV 92 01/29/2023   PLT 303 01/29/2023   Lab Results  Component Value Date   NA 143 01/29/2023   K 3.8 01/29/2023   CO2 24 01/29/2023   GLUCOSE 110 (H) 01/29/2023   BUN 18 01/29/2023   CREATININE 1.02 (H) 01/29/2023   BILITOT 0.3 01/29/2023   ALKPHOS 48 01/29/2023   AST 19 01/29/2023   ALT 15 01/29/2023   PROT 6.9 01/29/2023   ALBUMIN 4.3 01/29/2023   CALCIUM 9.9 01/29/2023   EGFR 57 (L) 01/29/2023   Lab Results  Component Value Date   CHOL 153 01/29/2023   Lab Results  Component Value Date   HDL 45 01/29/2023   Lab Results  Component Value Date   LDLCALC 86 01/29/2023   Lab Results  Component Value Date   TRIG 126 01/29/2023   Lab Results  Component Value Date   CHOLHDL 3.4 01/29/2023   Lab Results  Component Value Date   HGBA1C 6.6 (H) 01/29/2023       Assessment & Plan:  There are no diagnoses linked to this encounter.   No orders of the defined types were placed in this encounter.   No orders of the defined types were placed in this encounter.    Follow-up: No follow-ups on file.  An After Visit Summary was printed and given to the patient.  Langley Gauss, [place] [facility] [phone]

## 2023-02-09 NOTE — Progress Notes (Signed)
Virtual Visit via Telephone Note   This visit type was conducted secondary to patient preference OR due to national recommendations for restrictions regarding the COVID-19 Pandemic (e.g. social distancing) in an effort to limit this patient's exposure and mitigate transmission in our community.  Due to her co-morbid illnesses, this patient is at least at moderate risk for complications without adequate follow up.  This format is felt to be most appropriate for this patient at this time.  All issues noted in this document were discussed and addressed.  No physical exam could be performed with this format.  Patient verbally consented to a telehealth visit.   Date:  02/09/2023   ID:  Mindy Ingram, DOB 1945-08-15, MRN [id number]  Patient Location: Other:  outside of office Provider Location: Office/Clinic  PCP:  Blane Ohara, MD   History of Present Illness:    Chief Complaint:  Covid Positive  Mindy Ingram is a 77 y.o. female with sinus congestion, Sore throat, and body aches, stated that she started having sinus symptoms Friday but started having body pains on Saturday, denies fever. Patient did have questions about COVID and about the Paxlovid that we are going to send for her since her test has come back positive. Discussed with the patient the benefits of the medications and what to expect with the possible effect of COVID. Answered her questions about the condition and what symptoms she should expect to last the longest due to this current strain of COVID.  The patient does have symptoms concerning for COVID-19 infection (fever, chills, cough, or new shortness of breath).    Past Medical History:  Diagnosis Date   Acute URI 02/09/2023   Allergic asthma, mild intermittent, uncomplicated 10/17/2007   Office Spirometry 04/03/2015- WNL FVC 2.14/110%, FEV1 1.94/128%, FEV1/FVC 0.91  Office spirometry- 11/04/16--by allergy office- FEV1/FVC 0.77 WNL   Bilateral impacted cerumen  07/01/2018   BMI 29.0-29.9,adult 06/10/2022   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   Cervical radicular pain 01/20/2022   Chronic back pain    Contusion of head, initial encounter 03/18/2021   Corn of toe 06/25/2020   Degeneration of lumbosacral intervertebral disc 01/26/2012   DJD (degenerative joint disease)    DM type 2 with diabetic mixed hyperlipidemia (HCC) 04/08/2021   Dyslipidemia 01/23/2015   Essential hypertension 01/23/2015   Fibromyalgia    GERD 10/17/2007   Qualifier: Diagnosis of  By: Maple Hudson MD, Clinton D    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   Hyperlipidemia 06/25/2020   Hypertension associated with diabetes (HCC) 01/23/2015   Lumbar facet arthropathy 01/20/2022   Lumbar spondylolysis 02/10/2012   Menopause 06/25/2020   Metatarsalgia of both feet 09/18/2016   Multiple benign melanocytic nevi 06/25/2020   Myalgia 03/03/2022   Myalgia due to statin 03/03/2022   Neck pain 12/12/2021   Obesity 07/10/2021   OSA (obstructive sleep apnea)    cpap setting of 10   Pain 03/24/2018   Preop cardiovascular exam 06/10/2022   Right rotator cuff tear 06/25/2020   Right temporal frontal scalp contusions 03/18/2021   S/P lumbar fusion 03/16/2012   Seasonal and perennial allergic rhinitis 10/17/2007   Allergy vaccine 2002- dc'd   Shingles    Shoulder joint pain 06/25/2020   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   Statin myopathy 06/10/2022   Thoracic spine pain 02/23/2013   Type II or unspecified type diabetes mellitus without mention of complication, not stated as uncontrolled  Past Surgical History:  Procedure Laterality Date   APPENDECTOMY  1995   BACK SURGERY  2013   lower back with plates and screws   benign lump right axilla     BILATERAL SALPINGOOPHORECTOMY  1995   CERVICAL SPINE SURGERY  1992   at [facility], slight limitation with turning neck   CHOLECYSTECTOMY  1995   EUS N/A 07/21/2013   Procedure: UPPER  ENDOSCOPIC ULTRASOUND (EUS) LINEAR;  Surgeon: Rachael Fee, MD;  Location: [facility];  Service: Endoscopy;  Laterality: N/A;   FOOT SURGERY     2007 - right great toe, 2000 - right fifth digit .   NEUROPLASTY / TRANSPOSITION MEDIAN NERVE AT CARPAL TUNNEL BILATERAL  1995   PARTIAL COLECTOMY  1995   benign adhesions   PARTIAL HYSTERECTOMY  1980s.   abdominal   right foot surgery  2000   right great toe with artificial bone inserted   right knee arthroscopy Right 02/2017    Family History  Problem Relation Age of Onset   Heart disease Mother    Prostate cancer Father    Heart attack Sister    Breast cancer Maternal Aunt    Prostate cancer Paternal Uncle    Allergies Neg Hx    Asthma Neg Hx    Eczema Neg Hx    Immunodeficiency Neg Hx     Social History   Socioeconomic History   Marital status: Widowed    Spouse name: Not on file   Number of children: 1   Years of education: Not on file   Highest education level: Not on file  Occupational History   Occupation: Retired  Tobacco Use   Smoking status: Never   Smokeless tobacco: Never  Vaping Use   Vaping status: Never Used  Substance and Sexual Activity   Alcohol use: No   Drug use: No   Sexual activity: Not on file  Other Topics Concern   Not on file  Social History Narrative   Not on file   Social Determinants of Health   Financial Resource Strain: High Risk (12/04/2022)   Overall Financial Resource Strain (CARDIA)    Difficulty of Paying Living Expenses: Hard  Food Insecurity: No Food Insecurity (05/26/2022)   Hunger Vital Sign    Worried About Running Out of Food in the Last Year: Never true    Ran Out of Food in the Last Year: Never true  Transportation Needs: No Transportation Needs (08/13/2022)   PRAPARE - Administrator, Civil Service (Medical): No    Lack of Transportation (Non-Medical): No  Physical Activity: Insufficiently Active (05/26/2022)   Exercise Vital Sign    Days of Exercise  per Week: 2 days    Minutes of Exercise per Session: 40 min  Stress: No Stress Concern Present (05/26/2022)   [employer] of Occupational Health - Occupational Stress Questionnaire    Feeling of Stress : Not at all  Social Connections: Moderately Isolated (05/26/2022)   Social Connection and Isolation Panel [NHANES]    Frequency of Communication with Friends and Family: More than three times a week    Frequency of Social Gatherings with Friends and Family: More than three times a week    Attends Religious Services: More than 4 times per year    Active Member of [employer] or Organizations: No    Attends Banker Meetings: Never    Marital Status: Widowed  Intimate Partner Violence: Not At Risk (05/26/2022)   Humiliation, Afraid, Rape,  and Kick questionnaire    Fear of Current or Ex-Partner: No    Emotionally Abused: No    Physically Abused: No    Sexually Abused: No    Outpatient Medications Prior to Visit  Medication Sig Dispense Refill   acyclovir (ZOVIRAX) 400 MG tablet Take 1 tablet (400 mg total) by mouth 2 (two) times daily as needed (outbreak). 100 tablet 3   amLODipine (NORVASC) 5 MG tablet TAKE ONE-HALF TABLET BY MOUTH  DAILY 100 tablet 0   aspirin EC 81 MG tablet Take 81 mg by mouth daily.     azelastine (ASTELIN) 0.1 % nasal spray Place 1 spray into both nostrils 2 (two) times daily as needed for allergies or rhinitis.     Blood Glucose Monitoring Suppl (ONE TOUCH ULTRA 2) w/Device KIT Use as instructed five times daily. E11.69 (Patient taking differently: 1 each as directed. Use as instructed four  times daily. E11.69) 1 kit 0   cetirizine (ZYRTEC) 10 MG tablet Take 1 tablet (10 mg total) by mouth daily as needed for allergies. 90 tablet 3   Cholecalciferol (VITAMIN D PO) Take 2,000 Units by mouth daily.      diclofenac Sodium (VOLTAREN) 1 % GEL Apply 4 g topically 4 (four) times daily as needed (joint pain). 350 g 2   Elderberry 575 MG/5ML SYRP Take 5 mLs by  mouth daily.     ezetimibe (ZETIA) 10 MG tablet TAKE 1 TABLET BY MOUTH DAILY 100 tablet 0   fluticasone (FLONASE) 50 MCG/ACT nasal spray USE 1 SPRAY IN EACH NOSTRIL EVERY DAY (Patient taking differently: Place 1 spray into both nostrils in the morning and at bedtime.) 48 g 1   Glucose Blood (BLOOD GLUCOSE TEST STRIPS 333) STRP 100 each by In Vitro route 3 (three) times daily before meals. 300 strip 3   Lancets 30G MISC 3 Boxes by Does not apply route every 4 (four) hours as needed. 100 each 2   losartan-hydrochlorothiazide (HYZAAR) 100-25 MG tablet TAKE 1 TABLET BY MOUTH DAILY 100 tablet 0   metFORMIN (GLUCOPHAGE) 500 MG tablet Take 1 tablet (500 mg total) by mouth 2 (two) times daily with a meal. 180 tablet 0   methocarbamol (ROBAXIN) 500 MG tablet Take 1 tablet (500 mg total) by mouth 2 (two) times daily. 180 tablet 0   metoprolol (TOPROL-XL) 200 MG 24 hr tablet TAKE 1 TABLET(200 MG) BY MOUTH DAILY 90 tablet 0   Multiple Vitamins-Minerals (COMPLETE ENERGY) TABS Take 1 tablet by mouth daily.     nitroGLYCERIN (NITROSTAT) 0.4 MG SL tablet Place 1 tablet (0.4 mg total) under the tongue every 5 (five) minutes as needed for chest pain. 30 tablet 4   Omega-3 Fatty Acids (FISH OIL) 1000 MG CAPS Take 1 capsule by mouth daily.     omeprazole (PRILOSEC) 20 MG capsule TAKE 1 CAPSULE BY MOUTH DAILY 100 capsule 2   rosuvastatin (CRESTOR) 5 MG tablet Take 1 tablet (5 mg total) by mouth daily. 90 tablet 0   traMADol (ULTRAM) 50 MG tablet Take 50 mg by mouth 2 (two) times daily as needed for pain.     triamcinolone ointment (KENALOG) 0.1 % Apply 1 application topically 2 (two) times daily. 80 g 0   Turmeric (QC TUMERIC COMPLEX PO) Take 1,000 mg by mouth daily. Every other day     Facility-Administered Medications Prior to Visit  Medication Dose Route Frequency Provider Last Rate Last Admin   triamcinolone acetonide (KENALOG-40) injection 60 mg  60 mg  Intramuscular Once Marianne Sofia, PA-C        Allergies:    Dairy aid [tilactase], Lactose, Atorvastatin, Hydrocodone-acetaminophen, Hydrocodone-acetaminophen, Milk-related compounds, Mirabegron, Oxycontin [oxycodone hcl], Prochlorperazine edisylate, Rosuvastatin, Shellfish allergy, Umeclidinium-vilanterol, and Percodan [oxycodone-aspirin]   Social History   Tobacco Use   Smoking status: Never   Smokeless tobacco: Never  Vaping Use   Vaping status: Never Used  Substance Use Topics   Alcohol use: No   Drug use: No     Review of Systems  Constitutional:  Positive for malaise/fatigue. Negative for chills and fever.  Eyes:  Negative for pain.  Respiratory:  Positive for cough and shortness of breath. Negative for hemoptysis.   Cardiovascular:  Negative for chest pain and palpitations.  Gastrointestinal:  Negative for abdominal pain, heartburn, nausea and vomiting.  Musculoskeletal:  Positive for joint pain and myalgias.  Neurological:  Negative for dizziness and headaches.     Labs/Other Tests and Data Reviewed:    Recent Labs: 02/28/2022: Magnesium 1.8 01/29/2023: ALT 15; BUN 18; Creatinine, Ser 1.02; Hemoglobin 12.5; Platelets 303; Potassium 3.8; Sodium 143   Recent Lipid Panel Lab Results  Component Value Date/Time   CHOL 153 01/29/2023 09:24 AM   TRIG 126 01/29/2023 09:24 AM   HDL 45 01/29/2023 09:24 AM   CHOLHDL 3.4 01/29/2023 09:24 AM   LDLCALC 86 01/29/2023 09:24 AM    Wt Readings from Last 3 Encounters:  02/03/23 144 lb 6.4 oz (65.5 kg)  10/07/22 147 lb 12.8 oz (67 kg)  06/10/22 148 lb 8 oz (67.4 kg)     Objective:    Vital Signs:  There were no vitals taken for this visit.   Physical Exam  Not performed due to COVID positive. Discussed symptoms and performed exam over the phone.   ASSESSMENT & PLAN:    Lab test positive for detection of COVID-19 virus Patient will take Paxlovid 10 X 150MG  & 10 X 100MG  for renal precautions Patient will quarantine for 3 days and wear a mask when going out for the next week Patient  will let us know if symptoms persist or change after 1 week.     Orders Placed This Encounter  Procedures   POC COVID-19     Meds ordered this encounter  Medications   nirmatrelvir/ritonavir, renal dosing, (PAXLOVID) 10 x 150 MG & 10 x 100MG  TABS    Sig: Take 2 tablets by mouth 2 (two) times daily for 5 days. (Take nirmatrelvir 150 mg one tablet twice daily for 5 days and ritonavir 100 mg one tablet twice daily for 5 days) Patient GFR is 57    Dispense:  20 tablet    Refill:  0    I spent < time > minutes dedicated to the care of this patient on the date of this encounter to include face-to-face time with the patient, as well as: 30  Follow Up:  In Person prn  Hartley Barefoot, [place]  02/09/2023 4:33 PM    [facility] [place]

## 2023-02-10 ENCOUNTER — Ambulatory Visit: Payer: Medicare Other

## 2023-02-24 ENCOUNTER — Ambulatory Visit (INDEPENDENT_AMBULATORY_CARE_PROVIDER_SITE_OTHER): Payer: Medicare Other

## 2023-02-24 DIAGNOSIS — H6123 Impacted cerumen, bilateral: Secondary | ICD-10-CM | POA: Diagnosis not present

## 2023-02-25 NOTE — Progress Notes (Signed)
Patient was here for bilateral ear irrigation due to impacted cerumen. I was able to removed the cerumen from both ears. Patient tolerated procedure well.

## 2023-02-27 DIAGNOSIS — M1711 Unilateral primary osteoarthritis, right knee: Secondary | ICD-10-CM | POA: Diagnosis not present

## 2023-02-27 DIAGNOSIS — M1712 Unilateral primary osteoarthritis, left knee: Secondary | ICD-10-CM | POA: Diagnosis not present

## 2023-02-27 DIAGNOSIS — M170 Bilateral primary osteoarthritis of knee: Secondary | ICD-10-CM | POA: Diagnosis not present

## 2023-03-19 ENCOUNTER — Other Ambulatory Visit: Payer: Self-pay | Admitting: Family Medicine

## 2023-03-28 ENCOUNTER — Other Ambulatory Visit: Payer: Self-pay | Admitting: Family Medicine

## 2023-04-01 DIAGNOSIS — M170 Bilateral primary osteoarthritis of knee: Secondary | ICD-10-CM | POA: Diagnosis not present

## 2023-05-05 ENCOUNTER — Other Ambulatory Visit: Payer: Self-pay | Admitting: Family Medicine

## 2023-05-06 ENCOUNTER — Other Ambulatory Visit: Payer: Self-pay | Admitting: Family Medicine

## 2023-05-07 ENCOUNTER — Other Ambulatory Visit: Payer: Self-pay | Admitting: Family Medicine

## 2023-05-10 ENCOUNTER — Other Ambulatory Visit: Payer: Self-pay | Admitting: Family Medicine

## 2023-05-10 DIAGNOSIS — E1169 Type 2 diabetes mellitus with other specified complication: Secondary | ICD-10-CM

## 2023-05-13 ENCOUNTER — Other Ambulatory Visit: Payer: Self-pay | Admitting: Family Medicine

## 2023-05-19 ENCOUNTER — Other Ambulatory Visit: Payer: Self-pay | Admitting: Family Medicine

## 2023-05-19 DIAGNOSIS — Z1231 Encounter for screening mammogram for malignant neoplasm of breast: Secondary | ICD-10-CM

## 2023-05-22 ENCOUNTER — Other Ambulatory Visit: Payer: Self-pay | Admitting: Family Medicine

## 2023-06-08 DIAGNOSIS — M1712 Unilateral primary osteoarthritis, left knee: Secondary | ICD-10-CM | POA: Diagnosis not present

## 2023-06-10 DIAGNOSIS — M1711 Unilateral primary osteoarthritis, right knee: Secondary | ICD-10-CM | POA: Diagnosis not present

## 2023-06-11 ENCOUNTER — Other Ambulatory Visit: Payer: Self-pay | Admitting: Family Medicine

## 2023-06-11 DIAGNOSIS — E1169 Type 2 diabetes mellitus with other specified complication: Secondary | ICD-10-CM

## 2023-06-15 DIAGNOSIS — M1712 Unilateral primary osteoarthritis, left knee: Secondary | ICD-10-CM | POA: Diagnosis not present

## 2023-06-16 ENCOUNTER — Ambulatory Visit (INDEPENDENT_AMBULATORY_CARE_PROVIDER_SITE_OTHER): Payer: Medicare Other

## 2023-06-16 DIAGNOSIS — H6123 Impacted cerumen, bilateral: Secondary | ICD-10-CM | POA: Diagnosis not present

## 2023-06-17 DIAGNOSIS — M1711 Unilateral primary osteoarthritis, right knee: Secondary | ICD-10-CM | POA: Diagnosis not present

## 2023-06-17 NOTE — Progress Notes (Signed)
Patient is in office today for a nurse visit for Ear Lavage. Patient Ear Irrigation was preformed  bilaterally ear.

## 2023-06-18 ENCOUNTER — Other Ambulatory Visit: Payer: Self-pay

## 2023-06-18 DIAGNOSIS — E1169 Type 2 diabetes mellitus with other specified complication: Secondary | ICD-10-CM

## 2023-06-18 MED ORDER — BD LANCET ULTRAFINE 33G MISC: 1.0000 | Freq: Two times a day (BID) | 1 refills | Status: DC

## 2023-06-22 DIAGNOSIS — M1712 Unilateral primary osteoarthritis, left knee: Secondary | ICD-10-CM | POA: Diagnosis not present

## 2023-06-29 ENCOUNTER — Ambulatory Visit: Payer: Medicare Other

## 2023-06-29 DIAGNOSIS — M1711 Unilateral primary osteoarthritis, right knee: Secondary | ICD-10-CM | POA: Diagnosis not present

## 2023-07-15 ENCOUNTER — Ambulatory Visit
Admission: RE | Admit: 2023-07-15 | Discharge: 2023-07-15 | Disposition: A | Discharge status: Home / Self Care | Payer: Medicare Other | Source: Ambulatory Visit | Attending: Family Medicine | Admitting: Family Medicine

## 2023-07-15 DIAGNOSIS — Z1231 Encounter for screening mammogram for malignant neoplasm of breast: Secondary | ICD-10-CM | POA: Diagnosis not present

## 2023-07-21 ENCOUNTER — Other Ambulatory Visit: Payer: Self-pay

## 2023-07-21 DIAGNOSIS — R928 Other abnormal and inconclusive findings on diagnostic imaging of breast: Secondary | ICD-10-CM

## 2023-08-05 ENCOUNTER — Ambulatory Visit
Admission: RE | Admit: 2023-08-05 | Discharge: 2023-08-05 | Disposition: A | Discharge status: Home / Self Care | Payer: Medicare Other | Source: Ambulatory Visit | Attending: Family Medicine | Admitting: Family Medicine

## 2023-08-05 ENCOUNTER — Ambulatory Visit: Payer: Medicare Other

## 2023-08-05 DIAGNOSIS — R928 Other abnormal and inconclusive findings on diagnostic imaging of breast: Secondary | ICD-10-CM | POA: Diagnosis not present

## 2023-08-05 DIAGNOSIS — M170 Bilateral primary osteoarthritis of knee: Secondary | ICD-10-CM | POA: Diagnosis not present

## 2023-08-06 ENCOUNTER — Ambulatory Visit: Payer: Medicare Other

## 2023-08-06 VITALS — Ht 59.5 in | Wt 150.0 lb

## 2023-08-06 DIAGNOSIS — Z0000 Encounter for general adult medical examination without abnormal findings: Secondary | ICD-10-CM

## 2023-08-06 NOTE — Patient Instructions (Signed)
 Mindy Ingram , Thank you for taking time to come for your Medicare Wellness Visit. I appreciate your ongoing commitment to your health goals. Please review the following plan we discussed and let me know if I can assist you in the future.   Referrals/Orders/Follow-Ups/Clinician Recommendations: Yes; Keep maintaining your health by keeping your appointments with [facility] and any specialists that you may see.  Call us  if you need anything.  Have a great year!!!!  This is a list of the screening recommended for you and due dates:  Health Maintenance  Topic Date Due   Zoster (Shingles) Vaccine (2 of 2) 03/07/2021   Colon Cancer Screening  06/12/2022   COVID-19 Vaccine (5 - 2024-25 season) 03/01/2023   Hemoglobin A1C  08/01/2023   Yearly kidney function blood test for diabetes  01/29/2024   Yearly kidney health urinalysis for diabetes  02/03/2024   Complete foot exam   02/03/2024   Eye exam for diabetics  02/04/2024   Mammogram  07/14/2024   Medicare Annual Wellness Visit  08/05/2024   DTaP/Tdap/Td vaccine (4 - Td or Tdap) 10/18/2024   Pneumonia Vaccine  Completed   Flu Shot  Completed   DEXA scan (bone density measurement)  Completed   Hepatitis C Screening  Completed   HPV Vaccine  Aged Out    Advanced directives: (Copy Requested) Please bring a copy of your health care power of attorney and living will to the office to be added to your chart at your convenience.  Next Medicare Annual Wellness Visit scheduled for next year: Yes, It was nice speaking with you today! Your next Annual Wellness Visit is scheduled for 08/10/2024 at 3:40 p.m. via PHONE. If you need to reschedule or cancel, please call [phone].

## 2023-08-06 NOTE — Progress Notes (Signed)
 Subjective:   Mindy Ingram is a 78 y.o. female who presents for Medicare Annual (Subsequent) preventive examination.  Visit Complete: Virtual I connected with  Mindy Ingram on 08/06/23 by a audio enabled telemedicine application and verified that I am speaking with the correct person using two identifiers.  Patient Location: Home  Provider Location: Office/Clinic  I discussed the limitations of evaluation and management by telemedicine. The patient expressed understanding and agreed to proceed.  Vital Signs: Because this visit was a virtual/telehealth visit, some criteria may be missing or patient reported. Any vitals not documented were not able to be obtained and vitals that have been documented are patient reported.  This patient declined Interactive audio and acupuncturist. Therefore the visit was completed with audio only.  Cardiac Risk Factors include: advanced age (>69men, >74 women);diabetes mellitus;dyslipidemia;family history of premature cardiovascular disease;hypertension;sedentary lifestyle (knee problems since Decemeber 2024)     Objective:    Today's Vitals   08/06/23 1043  Weight: 150 lb (68 kg)  Height: 4' 11.5 (1.511 m)  PainSc: 0-No pain   Body mass index is 29.79 kg/m.     08/06/2023   10:47 AM 05/14/2021    3:42 PM 07/21/2013    9:28 AM 07/12/2013   11:54 AM  Advanced Directives  Does Patient Have a Medical Advance Directive? Yes No Patient does not have advance directive Patient does not have advance directive  Type of Advance Directive Healthcare Power of [place];Living will     Copy of Healthcare Power of Attorney in Chart? No - copy requested     Would patient like information on creating a medical advance directive?  No - Patient declined    Pre-existing out of facility DNR order (yellow form or pink MOST form)   No     Current Medications (verified) Outpatient Encounter Medications as of 08/06/2023  Medication Sig    acyclovir  (ZOVIRAX ) 400 MG tablet TAKE 1 TABLET BY MOUTH TWICE  DAILY AS NEEDED FOR OUTBREAK   amLODipine  (NORVASC ) 5 MG tablet TAKE ONE-HALF TABLET BY MOUTH  DAILY   aspirin EC 81 MG tablet Take 81 mg by mouth daily.   azelastine  (ASTELIN ) 0.1 % nasal spray Place 1 spray into both nostrils 2 (two) times daily as needed for allergies or rhinitis.   B-D ULTRA-FINE 33 LANCETS MISC 1 each by Does not apply route in the morning and at bedtime.   Blood Glucose Monitoring Suppl (ONE TOUCH ULTRA 2) w/Device KIT Use as instructed five times daily. E11.69 (Patient taking differently: 1 each as directed. Use as instructed four  times daily. E11.69)   cetirizine  (ZYRTEC ) 10 MG tablet Take 1 tablet (10 mg total) by mouth daily as needed for allergies.   Cholecalciferol (VITAMIN D PO) Take 2,000 Units by mouth daily.    diclofenac  Sodium (VOLTAREN ) 1 % GEL Apply 4 g topically 4 (four) times daily as needed (joint pain).   Elderberry 575 MG/5ML SYRP Take 5 mLs by mouth daily.   ezetimibe  (ZETIA ) 10 MG tablet TAKE 1 TABLET BY MOUTH DAILY   fluticasone  (FLONASE ) 50 MCG/ACT nasal spray USE 1 SPRAY IN EACH NOSTRIL EVERY DAY   Glucose Blood (BLOOD GLUCOSE TEST STRIPS 333) STRP 100 each by In Vitro route 3 (three) times daily before meals.   losartan -hydrochlorothiazide  (HYZAAR) 100-25 MG tablet TAKE 1 TABLET BY MOUTH DAILY   metFORMIN  (GLUCOPHAGE ) 500 MG tablet Take 1 tablet (500 mg total) by mouth 2 (two) times daily with a meal.  metFORMIN  (GLUCOPHAGE -XR) 500 MG 24 hr tablet TAKE 1 TABLET BY MOUTH TWICE  DAILY   methocarbamol  (ROBAXIN ) 500 MG tablet Take 1 tablet (500 mg total) by mouth 2 (two) times daily.   metoprolol  (TOPROL -XL) 200 MG 24 hr tablet TAKE 1 TABLET(200 MG) BY MOUTH DAILY   Multiple Vitamins-Minerals (COMPLETE ENERGY) TABS Take 1 tablet by mouth daily.   nitroGLYCERIN  (NITROSTAT ) 0.4 MG SL tablet Place 1 tablet (0.4 mg total) under the tongue every 5 (five) minutes as needed for chest pain.    Omega-3 Fatty Acids (FISH OIL) 1000 MG CAPS Take 1 capsule by mouth daily.   omeprazole  (PRILOSEC) 20 MG capsule TAKE 1 CAPSULE BY MOUTH DAILY   rosuvastatin  (CRESTOR ) 5 MG tablet TAKE 1 TABLET(5 MG) BY MOUTH DAILY   traMADol  (ULTRAM ) 50 MG tablet Take 50 mg by mouth 2 (two) times daily as needed for pain.   triamcinolone  ointment (KENALOG ) 0.1 % Apply 1 application topically 2 (two) times daily.   Turmeric (QC TUMERIC COMPLEX PO) Take 1,000 mg by mouth daily. Every other day   Facility-Administered Encounter Medications as of 08/06/2023  Medication   triamcinolone  acetonide (KENALOG -40) injection 60 mg    Allergies (verified) Dairy aid [tilactase], Lactose, Atorvastatin, Hydrocodone-acetaminophen, Hydrocodone-acetaminophen, Milk-related compounds, Mirabegron, Oxycontin [oxycodone hcl], Prochlorperazine edisylate, Rosuvastatin , Shellfish allergy , Umeclidinium-vilanterol, and Percodan [oxycodone-aspirin]   History: Past Medical History:  Diagnosis Date   Acute URI 02/09/2023   Allergic asthma, mild intermittent, uncomplicated 10/17/2007   Office Spirometry 04/03/2015- WNL FVC 2.14/110%, FEV1 1.94/128%, FEV1/FVC 0.91  Office spirometry- 11/04/16--by allergy  office- FEV1/FVC 0.77 WNL   Bilateral impacted cerumen 07/01/2018   BMI 29.0-29.9,adult 06/10/2022   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   Cervical radicular pain 01/20/2022   Chronic back pain    Contusion of head, initial encounter 03/18/2021   Corn of toe 06/25/2020   Degeneration of lumbosacral intervertebral disc 01/26/2012   DJD (degenerative joint disease)    DM type 2 with diabetic mixed hyperlipidemia (HCC) 04/08/2021   Dyslipidemia 01/23/2015   Essential hypertension 01/23/2015   Fibromyalgia    GERD 10/17/2007   Qualifier: Diagnosis of  By: Neysa MD, Clinton D    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   Hyperlipidemia 06/25/2020   Hypertension associated with diabetes (HCC) 01/23/2015   Lumbar facet arthropathy  01/20/2022   Lumbar spondylolysis 02/10/2012   Menopause 06/25/2020   Metatarsalgia of both feet 09/18/2016   Multiple benign melanocytic nevi 06/25/2020   Myalgia 03/03/2022   Myalgia due to statin 03/03/2022   Neck pain 12/12/2021   Obesity 07/10/2021   OSA (obstructive sleep apnea)    cpap setting of 10   Pain 03/24/2018   Preop cardiovascular exam 06/10/2022   Right rotator cuff tear 06/25/2020   Right temporal frontal scalp contusions 03/18/2021   S/P lumbar fusion 03/16/2012   Seasonal and perennial allergic rhinitis 10/17/2007   Allergy  vaccine 2002- dc'd   Shingles    Shoulder joint pain 06/25/2020   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   Statin myopathy 06/10/2022   Thoracic spine pain 02/23/2013   Type II or unspecified type diabetes mellitus without mention of complication, not stated as uncontrolled    Past Surgical History:  Procedure Laterality Date   APPENDECTOMY  1995   BACK SURGERY  2013   lower back with plates and screws   benign lump right axilla     BILATERAL SALPINGOOPHORECTOMY  1995   CERVICAL SPINE SURGERY  1992  at [facility], slight limitation with turning neck   CHOLECYSTECTOMY  1995   EUS N/A 07/21/2013   Procedure: UPPER ENDOSCOPIC ULTRASOUND (EUS) LINEAR;  Surgeon: Toribio SHAUNNA Cedar, MD;  Location: [facility];  Service: Endoscopy;  Laterality: N/A;   FOOT SURGERY     2007 - right great toe, 2000 - right fifth digit .   NEUROPLASTY / TRANSPOSITION MEDIAN NERVE AT CARPAL TUNNEL BILATERAL  1995   PARTIAL COLECTOMY  1995   benign adhesions   PARTIAL HYSTERECTOMY  1980s.   abdominal   right foot surgery  2000   right great toe with artificial bone inserted   right knee arthroscopy Right 02/2017   Family History  Problem Relation Age of Onset   Heart disease Mother    Prostate cancer Father    Heart attack Sister    Breast cancer Maternal Aunt    Prostate cancer Paternal Uncle    Allergies Neg Hx     Asthma Neg Hx    Eczema Neg Hx    Immunodeficiency Neg Hx    Social History   Socioeconomic History   Marital status: Widowed    Spouse name: Not on file   Number of children: 1   Years of education: Not on file   Highest education level: Not on file  Occupational History   Occupation: Retired  Tobacco Use   Smoking status: Never   Smokeless tobacco: Never  Vaping Use   Vaping status: Never Used  Substance and Sexual Activity   Alcohol use: No   Drug use: No   Sexual activity: Not on file  Other Topics Concern   Not on file  Social History Narrative   Not on file   Social Drivers of Health   Financial Resource Strain: Low Risk  (08/06/2023)   Overall Financial Resource Strain (CARDIA)    Difficulty of Paying Living Expenses: Not very hard  Food Insecurity: No Food Insecurity (08/06/2023)   Hunger Vital Sign    Worried About Running Out of Food in the Last Year: Never true    Ran Out of Food in the Last Year: Never true  Transportation Needs: No Transportation Needs (08/06/2023)   PRAPARE - Administrator, Civil Service (Medical): No    Lack of Transportation (Non-Medical): No  Physical Activity: Insufficiently Active (08/06/2023)   Exercise Vital Sign    Days of Exercise per Week: 2 days    Minutes of Exercise per Session: 40 min  Stress: No Stress Concern Present (08/06/2023)   [employer] of Occupational Health - Occupational Stress Questionnaire    Feeling of Stress : Not at all  Social Connections: Moderately Isolated (08/06/2023)   Social Connection and Isolation Panel [NHANES]    Frequency of Communication with Friends and Family: More than three times a week    Frequency of Social Gatherings with Friends and Family: More than three times a week    Attends Religious Services: More than 4 times per year    Active Member of [employer] or Organizations: No    Attends Banker Meetings: Never    Marital Status: Widowed    Tobacco  Counseling Counseling given: Not Answered   Clinical Intake:  Pre-visit preparation completed: Yes  Pain : No/denies pain Pain Score: 0-No pain     Nutritional Risks: None Diabetes: Yes CBG done?: No Did pt. bring in CBG monitor from home?: No  How often do you need to have someone help you when you  read instructions, pamphlets, or other written materials from your doctor or pharmacy?: 1 - Never What is the last grade level you completed in school?: HSG  Interpreter Needed?: No  Information entered by :: Kaileena Obi N. Frandy Basnett, LPN.   Activities of Daily Living    08/06/2023   10:54 AM  In your present state of health, do you have any difficulty performing the following activities:  Hearing? 0  Vision? 0  Difficulty concentrating or making decisions? 0  Walking or climbing stairs? 0  Dressing or bathing? 0  Doing errands, shopping? 0  Preparing Food and eating ? N  Using the Toilet? N  In the past six months, have you accidently leaked urine? N  Do you have problems with loss of bowel control? N  Managing your Medications? N  Managing your Finances? N  Housekeeping or managing your Housekeeping? N    Patient Care Team: Sherre Clapper, MD as PCP - General (Family Medicine) Towana Charleston, MD (Gastroenterology) Mai Lynwood FALCON, MD as Consulting Physician (Rheumatology) Myrick Elspeth PARAS., DPM (Podiatry) Lavonia Lye, MD as Consulting Physician (Ophthalmology) Laurice Grice, OD (Optometry) Neysa Reggy BIRCH, MD as Consulting Physician (Pulmonary Disease)  Indicate any recent Medical Services you may have received from other than [facility] providers in the past year (date may be approximate).     Assessment:   This is a routine wellness examination for [employer].  Hearing/Vision screen Hearing Screening - Comments:: Patient has hearing difficulties; no hearing aids.  Vision Screening - Comments:: Wears rx glasses - up to date with routine eye exams with Dr. Laurice      Goals Addressed             This Visit's Progress    CCM:  Monitor and Maintain HbA1c <7%       My goal is to get my HgA1C down to 5.7-6%      Depression Screen    08/06/2023   10:51 AM 02/03/2023   11:26 AM 05/26/2022    1:02 PM 02/28/2022    9:20 AM 08/06/2021   10:12 AM 05/14/2021    3:43 PM 02/26/2021   10:16 AM  PHQ 2/9 Scores  PHQ - 2 Score 1 0 0 0 0 0 0  PHQ- 9 Score 1 0         Fall Risk    08/06/2023   10:50 AM 02/03/2023   11:26 AM 06/10/2022    9:37 AM 05/26/2022    1:05 PM 02/28/2022    9:20 AM  Fall Risk   Falls in the past year? 0 0 1 1 1   Number falls in past yr: 0 0 1 0 0  Injury with Fall? 0 0 1 1 1   Risk for fall due to : No Fall Risks No Fall Risks History of fall(s) No Fall Risks   Follow up Falls prevention discussed;Falls evaluation completed Falls evaluation completed;Follow up appointment Falls evaluation completed Falls evaluation completed     MEDICARE RISK AT HOME: Medicare Risk at Home Any stairs in or around the home?: No If so, are there any without handrails?: No Home free of loose throw rugs in walkways, pet beds, electrical cords, etc?: Yes Adequate lighting in your home to reduce risk of falls?: Yes Life alert?: No Use of a cane, walker or w/c?: No Grab bars in the bathroom?: Yes Shower chair or bench in shower?: Yes Elevated toilet seat or a handicapped toilet?: Yes  TIMED UP AND GO:  Was the test  performed?  No    Cognitive Function:    08/06/2023   10:51 AM  MMSE - Mini Mental State Exam  Not completed: Unable to complete        08/06/2023   10:51 AM 05/26/2022    1:07 PM 05/14/2021    3:46 PM  6CIT Screen  What Year? 0 points 0 points 0 points  What month? 0 points 0 points 0 points  What time? 0 points 0 points 0 points  Count back from 20 0 points 0 points 0 points  Months in reverse 0 points 0 points 0 points  Repeat phrase 0 points 0 points 0 points  Total Score 0 points 0 points 0 points     Immunizations Immunization History  Administered Date(s) Administered   Fluad Quad(high Dose 65+) 04/08/2021, 04/16/2022, 04/24/2023   Hepatitis B, ADULT 07/01/2003   Influenza Split 03/12/2011, 03/30/2012, 04/01/2013, 04/04/2014, 04/03/2015, 04/02/2016, 03/30/2017, 04/14/2020   Influenza Whole 04/30/2009, 03/06/2011   Influenza, High Dose Seasonal PF 03/30/2017, 03/11/2018, 03/11/2018, 03/16/2019   Influenza,inj,Quad PF,6+ Mos 03/30/2013, 03/30/2014, 04/03/2015, 04/02/2016   Moderna Covid-19 Vaccine Bivalent Booster 77yrs & up 04/08/2021   Moderna Sars-Covid-2 Vaccination 08/05/2019, 08/31/2019, 04/25/2020   Pneumococcal Conjugate-13 02/16/2014   Pneumococcal Polysaccharide-23 06/30/1996, 10/28/2016   Td 07/01/2003   Tdap 01/16/2012, 10/19/2014   Zoster Recombinant(Shingrix) 01/10/2021   Zoster, Live 07/27/2013, 05/09/2020, 08/12/2020    TDAP status: Due, Education has been provided regarding the importance of this vaccine. Advised may receive this vaccine at local pharmacy or Health Dept. Aware to provide a copy of the vaccination record if obtained from local pharmacy or Health Dept. Verbalized acceptance and understanding.  Flu Vaccine status: Up to date  Pneumococcal vaccine status: Up to date  Covid-19 vaccine status: Completed vaccines  Qualifies for Shingles Vaccine? Yes   Zostavax completed No   Shingrix Completed?: Yes  Screening Tests Health Maintenance  Topic Date Due   Zoster Vaccines- Shingrix (2 of 2) 03/07/2021   Colonoscopy  06/12/2022   COVID-19 Vaccine (5 - 2024-25 season) 03/01/2023   HEMOGLOBIN A1C  08/01/2023   Diabetic kidney evaluation - eGFR measurement  01/29/2024   Diabetic kidney evaluation - Urine ACR  02/03/2024   FOOT EXAM  02/03/2024   OPHTHALMOLOGY EXAM  02/04/2024   MAMMOGRAM  07/14/2024   Medicare Annual Wellness (AWV)  08/05/2024   DTaP/Tdap/Td (4 - Td or Tdap) 10/18/2024   Pneumonia Vaccine 68+ Years old  Completed   INFLUENZA  VACCINE  Completed   DEXA SCAN  Completed   Hepatitis C Screening  Completed   HPV VACCINES  Aged Out    Health Maintenance  Health Maintenance Due  Topic Date Due   Zoster Vaccines- Shingrix (2 of 2) 03/07/2021   Colonoscopy  06/12/2022   COVID-19 Vaccine (5 - 2024-25 season) 03/01/2023   HEMOGLOBIN A1C  08/01/2023    Colorectal cancer screening: Type of screening: Colonoscopy. Completed 06/12/2017. Repeat every 5 years  Mammogram status: Completed 07/15/2023. Repeat every year  Bone Density status: Completed 02/20/2021. Results reflect: Bone density results: NORMAL. Repeat every 5 years.  Lung Cancer Screening: (Low Dose CT Chest recommended if Age 85-80 years, 20 pack-year currently smoking OR have quit w/in 15years.) does not qualify.   Lung Cancer Screening Referral: NO  Additional Screening:  Hepatitis C Screening: does qualify; Completed 02/03/2023  Vision Screening: Recommended annual ophthalmology exams for early detection of glaucoma and other disorders of the eye. Is the patient up to date with their  annual eye exam?  Yes  Who is the provider or what is the name of the office in which the patient attends annual eye exams? Dr. Laurice If pt is not established with a provider, would they like to be referred to a provider to establish care? No .   Dental Screening: Recommended annual dental exams for proper oral hygiene  Diabetic Foot Exam: Diabetic Foot Exam: Completed 02/03/2023  Community Resource Referral / Chronic Care Management: CRR required this visit?  No   CCM required this visit?  No     Plan:     I have personally reviewed and noted the following in the patient's chart:   Medical and social history Use of alcohol, tobacco or illicit drugs  Current medications and supplements including opioid prescriptions. Patient is not currently taking opioid prescriptions. Functional ability and status Nutritional status Physical activity Advanced  directives List of other physicians Hospitalizations, surgeries, and ER visits in previous 12 months Vitals Screenings to include cognitive, depression, and falls Referrals and appointments  In addition, I have reviewed and discussed with patient certain preventive protocols, quality metrics, and best practice recommendations. A written personalized care plan for preventive services as well as general preventive health recommendations were provided to patient.     Roz LOISE Fuller, LPN   12/30/7972   After Visit Summary: (Declined) Due to this being a telephonic visit, with patients personalized plan was offered to patient but patient Declined AVS at this time   Nurse Notes: None at this time.

## 2023-08-07 ENCOUNTER — Other Ambulatory Visit: Payer: Medicare Other

## 2023-08-12 ENCOUNTER — Other Ambulatory Visit: Payer: Self-pay | Admitting: Family Medicine

## 2023-08-25 ENCOUNTER — Other Ambulatory Visit: Payer: Self-pay | Admitting: Family Medicine

## 2023-08-25 ENCOUNTER — Encounter: Payer: Self-pay | Admitting: Physician Assistant

## 2023-08-25 ENCOUNTER — Ambulatory Visit (INDEPENDENT_AMBULATORY_CARE_PROVIDER_SITE_OTHER): Payer: Medicare Other | Admitting: Physician Assistant

## 2023-08-25 VITALS — BP 132/78 | HR 83 | Temp 98.0°F | Ht 59.5 in | Wt 142.8 lb

## 2023-08-25 DIAGNOSIS — E1169 Type 2 diabetes mellitus with other specified complication: Secondary | ICD-10-CM

## 2023-08-25 DIAGNOSIS — E782 Mixed hyperlipidemia: Secondary | ICD-10-CM

## 2023-08-25 DIAGNOSIS — J101 Influenza due to other identified influenza virus with other respiratory manifestations: Secondary | ICD-10-CM

## 2023-08-25 DIAGNOSIS — J029 Acute pharyngitis, unspecified: Secondary | ICD-10-CM

## 2023-08-25 HISTORY — DX: Influenza due to other identified influenza virus with other respiratory manifestations: J10.1

## 2023-08-25 HISTORY — DX: Acute pharyngitis, unspecified: J02.9

## 2023-08-25 LAB — POCT RAPID STREP A (OFFICE): Rapid Strep A Screen: NEGATIVE

## 2023-08-25 LAB — POCT INFLUENZA A/B
Influenza A, POC: POSITIVE — AB
Influenza B, POC: NEGATIVE

## 2023-08-25 LAB — POC COVID19 BINAXNOW: SARS Coronavirus 2 Ag: NEGATIVE

## 2023-08-25 MED ORDER — OSELTAMIVIR PHOSPHATE 30 MG PO CAPS: 30.0000 mg | ORAL_CAPSULE | Freq: Two times a day (BID) | ORAL | 0 refills | Status: DC

## 2023-08-25 MED ORDER — BD LANCET ULTRAFINE 33G MISC: 1.0000 | Freq: Two times a day (BID) | 1 refills | Status: DC

## 2023-08-25 NOTE — Progress Notes (Signed)
 Acute Office Visit  Subjective:    Patient ID: Mindy Ingram, female    DOB: 1946-05-09, 78 y.o.   MRN: [id number]  Chief Complaint  Patient presents with   Sore Throat    Discussed the use of AI scribe software for clinical note transcription with the patient, who gave verbal consent to proceed.  HPI: Patient is in today for flu like symptoms  Discussed the use of AI scribe software for clinical note transcription with the patient, who gave verbal consent to proceed.  History of Present Illness   The patient, with a history of hypertension and hyperlipidemia, presents with flu-like symptoms that started the previous night. She woke up with a sore throat and ear pain. She took an over-the-counter medication, for symptom relief. She has been to several large gatherings recently, but has tried to maintain distance from known sick individuals. She tested positive for the flu.  The patient also has a history of diabetes, with an A1c of 7.6 in December 2023, which improved to 6.6 in August 2024. However, she believes her A1c has likely increased again due to recent dietary changes.  She uses lancets for blood glucose monitoring, and recently had an issue with obtaining them due to cost.       Past Medical History:  Diagnosis Date   Acute URI 02/09/2023   Allergic asthma, mild intermittent, uncomplicated 10/17/2007   Office Spirometry 04/03/2015- WNL FVC 2.14/110%, FEV1 1.94/128%, FEV1/FVC 0.91  Office spirometry- 11/04/16--by allergy office- FEV1/FVC 0.77 WNL   Bilateral impacted cerumen 07/01/2018   BMI 29.0-29.9,adult 06/10/2022   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   Cervical radicular pain 01/20/2022   Chronic back pain    Contusion of head, initial encounter 03/18/2021   Corn of toe 06/25/2020   Degeneration of lumbosacral intervertebral disc 01/26/2012   DJD (degenerative joint disease)    DM type 2 with diabetic mixed hyperlipidemia (HCC) 04/08/2021    Dyslipidemia 01/23/2015   Essential hypertension 01/23/2015   Fibromyalgia    GERD 10/17/2007   Qualifier: Diagnosis of  By: Maple Hudson MD, Clinton D    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   Hyperlipidemia 06/25/2020   Hypertension associated with diabetes (HCC) 01/23/2015   Lumbar facet arthropathy 01/20/2022   Lumbar spondylolysis 02/10/2012   Menopause 06/25/2020   Metatarsalgia of both feet 09/18/2016   Multiple benign melanocytic nevi 06/25/2020   Myalgia 03/03/2022   Myalgia due to statin 03/03/2022   Neck pain 12/12/2021   Obesity 07/10/2021   OSA (obstructive sleep apnea)    cpap setting of 10   Pain 03/24/2018   Preop cardiovascular exam 06/10/2022   Right rotator cuff tear 06/25/2020   Right temporal frontal scalp contusions 03/18/2021   S/P lumbar fusion 03/16/2012   Seasonal and perennial allergic rhinitis 10/17/2007   Allergy vaccine 2002- dc'd   Shingles    Shoulder joint pain 06/25/2020   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   Statin myopathy 06/10/2022   Thoracic spine pain 02/23/2013   Type II or unspecified type diabetes mellitus without mention of complication, not stated as uncontrolled     Past Surgical History:  Procedure Laterality Date   APPENDECTOMY  1995   BACK SURGERY  2013   lower back with plates and screws   benign lump right axilla     BILATERAL SALPINGOOPHORECTOMY  1995   CERVICAL SPINE SURGERY  1992   at [facility], slight limitation with turning neck  CHOLECYSTECTOMY  1995   EUS N/A 07/21/2013   Procedure: UPPER ENDOSCOPIC ULTRASOUND (EUS) LINEAR;  Surgeon: Rachael Fee, MD;  Location: [facility];  Service: Endoscopy;  Laterality: N/A;   FOOT SURGERY     2007 - right great toe, 2000 - right fifth digit .   NEUROPLASTY / TRANSPOSITION MEDIAN NERVE AT CARPAL TUNNEL BILATERAL  1995   PARTIAL COLECTOMY  1995   benign adhesions   PARTIAL HYSTERECTOMY  1980s.   abdominal   right foot surgery  2000    right great toe with artificial bone inserted   right knee arthroscopy Right 02/2017    Family History  Problem Relation Age of Onset   Heart disease Mother    Prostate cancer Father    Heart attack Sister    Breast cancer Maternal Aunt    Prostate cancer Paternal Uncle    Allergies Neg Hx    Asthma Neg Hx    Eczema Neg Hx    Immunodeficiency Neg Hx     Social History   Socioeconomic History   Marital status: Widowed    Spouse name: Not on file   Number of children: 1   Years of education: Not on file   Highest education level: Not on file  Occupational History   Occupation: Retired  Tobacco Use   Smoking status: Never   Smokeless tobacco: Never  Vaping Use   Vaping status: Never Used  Substance and Sexual Activity   Alcohol use: No   Drug use: No   Sexual activity: Not on file  Other Topics Concern   Not on file  Social History Narrative   Not on file   Social Drivers of Health   Financial Resource Strain: Low Risk  (08/06/2023)   Overall Financial Resource Strain (CARDIA)    Difficulty of Paying Living Expenses: Not very hard  Food Insecurity: No Food Insecurity (08/06/2023)   Hunger Vital Sign    Worried About Running Out of Food in the Last Year: Never true    Ran Out of Food in the Last Year: Never true  Transportation Needs: No Transportation Needs (08/06/2023)   PRAPARE - Administrator, Civil Service (Medical): No    Lack of Transportation (Non-Medical): No  Physical Activity: Insufficiently Active (08/06/2023)   Exercise Vital Sign    Days of Exercise per Week: 2 days    Minutes of Exercise per Session: 40 min  Stress: No Stress Concern Present (08/06/2023)   [employer] of Occupational Health - Occupational Stress Questionnaire    Feeling of Stress : Not at all  Social Connections: Moderately Isolated (08/06/2023)   Social Connection and Isolation Panel [NHANES]    Frequency of Communication with Friends and Family: More than three times a  week    Frequency of Social Gatherings with Friends and Family: More than three times a week    Attends Religious Services: More than 4 times per year    Active Member of [employer] or Organizations: No    Attends Banker Meetings: Never    Marital Status: Widowed  Intimate Partner Violence: Not At Risk (08/06/2023)   Humiliation, Afraid, Rape, and Kick questionnaire    Fear of Current or Ex-Partner: No    Emotionally Abused: No    Physically Abused: No    Sexually Abused: No    Outpatient Medications Prior to Visit  Medication Sig Dispense Refill   acyclovir (ZOVIRAX) 400 MG tablet TAKE 1 TABLET BY MOUTH  TWICE  DAILY AS NEEDED FOR OUTBREAK 100 tablet 0   amLODipine (NORVASC) 5 MG tablet TAKE ONE-HALF TABLET BY MOUTH  DAILY 50 tablet 2   aspirin EC 81 MG tablet Take 81 mg by mouth daily.     azelastine (ASTELIN) 0.1 % nasal spray Place 1 spray into both nostrils 2 (two) times daily as needed for allergies or rhinitis.     Blood Glucose Monitoring Suppl (ONE TOUCH ULTRA 2) w/Device KIT Use as instructed five times daily. E11.69 (Patient taking differently: 1 each as directed. Use as instructed four  times daily. E11.69) 1 kit 0   cetirizine (ZYRTEC) 10 MG tablet Take 1 tablet (10 mg total) by mouth daily as needed for allergies. 90 tablet 3   Cholecalciferol (VITAMIN D PO) Take 2,000 Units by mouth daily.      diclofenac Sodium (VOLTAREN) 1 % GEL Apply 4 g topically 4 (four) times daily as needed (joint pain). 350 g 2   Elderberry 575 MG/5ML SYRP Take 5 mLs by mouth daily.     ezetimibe (ZETIA) 10 MG tablet TAKE 1 TABLET BY MOUTH DAILY 100 tablet 2   fluticasone (FLONASE) 50 MCG/ACT nasal spray USE 1 SPRAY IN EACH NOSTRIL EVERY DAY 48 g 1   Glucose Blood (BLOOD GLUCOSE TEST STRIPS 333) STRP 100 each by In Vitro route 3 (three) times daily before meals. 300 strip 3   losartan-hydrochlorothiazide (HYZAAR) 100-25 MG tablet TAKE 1 TABLET BY MOUTH DAILY 100 tablet 1   metFORMIN  (GLUCOPHAGE) 500 MG tablet Take 1 tablet (500 mg total) by mouth 2 (two) times daily with a meal. 180 tablet 0   metFORMIN (GLUCOPHAGE-XR) 500 MG 24 hr tablet TAKE 1 TABLET BY MOUTH TWICE  DAILY 200 tablet 2   methocarbamol (ROBAXIN) 500 MG tablet Take 1 tablet (500 mg total) by mouth 2 (two) times daily. 180 tablet 0   metoprolol (TOPROL-XL) 200 MG 24 hr tablet TAKE 1 TABLET(200 MG) BY MOUTH DAILY 90 tablet 1   Multiple Vitamins-Minerals (COMPLETE ENERGY) TABS Take 1 tablet by mouth daily.     nitroGLYCERIN (NITROSTAT) 0.4 MG SL tablet Place 1 tablet (0.4 mg total) under the tongue every 5 (five) minutes as needed for chest pain. 30 tablet 4   Omega-3 Fatty Acids (FISH OIL) 1000 MG CAPS Take 1 capsule by mouth daily.     omeprazole (PRILOSEC) 20 MG capsule TAKE 1 CAPSULE BY MOUTH DAILY 100 capsule 2   rosuvastatin (CRESTOR) 5 MG tablet TAKE 1 TABLET(5 MG) BY MOUTH DAILY 90 tablet 1   traMADol (ULTRAM) 50 MG tablet Take 50 mg by mouth 2 (two) times daily as needed for pain.     triamcinolone ointment (KENALOG) 0.1 % Apply 1 application topically 2 (two) times daily. 80 g 0   Turmeric (QC TUMERIC COMPLEX PO) Take 1,000 mg by mouth daily. Every other day     B-D ULTRA-FINE 33 LANCETS MISC 1 each by Does not apply route in the morning and at bedtime. 180 each 1   triamcinolone acetonide (KENALOG-40) injection 60 mg      No facility-administered medications prior to visit.    Allergies  Allergen Reactions   Dairy Aid [Tilactase]     Hives, diarrhea   Lactose Anaphylaxis and Other (See Comments)   Atorvastatin Other (See Comments)    Severe myalgias   Hydrocodone-Acetaminophen     REACTION: sore throat with vicodin   Hydrocodone-Acetaminophen Other (See Comments)    sore throat with vicodin  Milk-Related Compounds Other (See Comments)   Mirabegron Other (See Comments)    Headache    Oxycontin [Oxycodone Hcl]     "makes me crazy"   Prochlorperazine Edisylate     REACTION: stroke-like  symptoms with companzine   Rosuvastatin     Can take brand crestor-severe myalgias   Shellfish Allergy    Umeclidinium-Vilanterol Other (See Comments)    Lactose allergy   Percodan [Oxycodone-Aspirin] Palpitations    Fast heartbeat with percodan    Review of Systems  Constitutional:  Negative for appetite change, fatigue and fever.  HENT:  Positive for ear pain (Bilateral ear pain) and sore throat. Negative for congestion and sinus pressure.   Respiratory:  Negative for cough, chest tightness, shortness of breath and wheezing.   Cardiovascular:  Negative for chest pain and palpitations.  Gastrointestinal:  Negative for abdominal pain, constipation, diarrhea, nausea and vomiting.  Genitourinary:  Negative for dysuria and hematuria.  Musculoskeletal:  Negative for arthralgias, back pain, joint swelling and myalgias.  Skin:  Negative for rash.  Neurological:  Negative for dizziness, weakness and headaches.  Psychiatric/Behavioral:  Negative for dysphoric mood. The patient is not nervous/anxious.        Objective:        08/25/2023    3:24 PM 08/06/2023   10:43 AM 02/03/2023   11:12 AM  Vitals with BMI  Height 4' 11.5" 4' 11.5" 4' 11.5"  Weight 142 lbs 13 oz 150 lbs 144 lbs 6 oz  BMI 28.37 29.8 28.69  Systolic 132  126  Diastolic 78  70  Pulse 83  78    Orthostatic VS for the past 72 hrs (Last 3 readings):  Patient Position BP Location  08/25/23 1524 Sitting Left Arm     Physical Exam Vitals reviewed.  Constitutional:      Appearance: Normal appearance.  Neck:     Vascular: No carotid bruit.  Cardiovascular:     Rate and Rhythm: Normal rate and regular rhythm.     Heart sounds: Normal heart sounds.  Pulmonary:     Effort: Pulmonary effort is normal.     Breath sounds: Normal breath sounds.  Abdominal:     General: Bowel sounds are normal.     Palpations: Abdomen is soft.     Tenderness: There is no abdominal tenderness.  Neurological:     Mental Status: She is  alert and oriented to person, place, and time.  Psychiatric:        Mood and Affect: Mood normal.        Behavior: Behavior normal.     Health Maintenance Due  Topic Date Due   Zoster Vaccines- Shingrix (2 of 2) 03/07/2021   Colonoscopy  06/12/2022   COVID-19 Vaccine (5 - 2024-25 season) 03/01/2023   HEMOGLOBIN A1C  08/01/2023    There are no preventive care reminders to display for this patient.   Lab Results  Component Value Date   TSH 1.370 11/19/2021   Lab Results  Component Value Date   WBC 4.3 01/29/2023   HGB 12.5 01/29/2023   HCT 38.8 01/29/2023   MCV 92 01/29/2023   PLT 303 01/29/2023   Lab Results  Component Value Date   NA 143 01/29/2023   K 3.8 01/29/2023   CO2 24 01/29/2023   GLUCOSE 110 (H) 01/29/2023   BUN 18 01/29/2023   CREATININE 1.02 (H) 01/29/2023   BILITOT 0.3 01/29/2023   ALKPHOS 48 01/29/2023   AST 19 01/29/2023   ALT  15 01/29/2023   PROT 6.9 01/29/2023   ALBUMIN 4.3 01/29/2023   CALCIUM 9.9 01/29/2023   EGFR 57 (L) 01/29/2023   Lab Results  Component Value Date   CHOL 153 01/29/2023   Lab Results  Component Value Date   HDL 45 01/29/2023   Lab Results  Component Value Date   LDLCALC 86 01/29/2023   Lab Results  Component Value Date   TRIG 126 01/29/2023   Lab Results  Component Value Date   CHOLHDL 3.4 01/29/2023   Lab Results  Component Value Date   HGBA1C 6.6 (H) 01/29/2023       Assessment & Plan:  Influenza A virus present Assessment & Plan: Positive flu test with symptoms of sore throat and ear pain. Patient self-medicated with over-the-counter medication. -Prescribe Tamiflu, adjusted dose due to potential decreased kidney function.  Orders: -     Oseltamivir Phosphate; Take 1 capsule (30 mg total) by mouth 2 (two) times daily.  Dispense: 10 capsule; Refill: 0  DM type 2 with diabetic mixed hyperlipidemia (HCC) Assessment & Plan: Hyperlipidemia Patient on Azithromycin and Prozac for cholesterol  management. Last A1c was 6.6 in August 2024, but patient suspects it may have increased due to recent dietary habits. -Plan to schedule blood work after resolution of flu symptoms to assess current cholesterol levels and A1c.  Diabetes Mellitus Last A1c was 6.6 in August 2024, but patient suspects it may have increased due to recent dietary habits. -Plan to schedule blood work after resolution of flu symptoms to assess current A1c.  Orders: -     BD Lancet Ultrafine 33G; 1 each by Does not apply route in the morning and at bedtime.  Dispense: 180 each; Refill: 1  Sore throat Assessment & Plan: Continue to monitor symptoms Continue using over the counter treatment to help with throat pain  Orders: -     POC COVID-19 BinaxNow -     POCT rapid strep A -     POCT Influenza A/B     Meds ordered this encounter  Medications   oseltamivir (TAMIFLU) 30 MG capsule    Sig: Take 1 capsule (30 mg total) by mouth 2 (two) times daily.    Dispense:  10 capsule    Refill:  0   B-D ULTRA-FINE 33 LANCETS MISC    Sig: 1 each by Does not apply route in the morning and at bedtime.    Dispense:  180 each    Refill:  1    Orders Placed This Encounter  Procedures   POC COVID-19 BinaxNow   POCT rapid strep A   POCT Influenza A/B    General Health Maintenance -Refill lancets for blood glucose monitoring. -Schedule follow-up appointment in April 2025.     Influenza Positive flu test with symptoms of sore throat and ear pain. Patient self-medicated with over-the-counter medication. -Prescribe Tamiflu, adjusted dose due to potential decreased kidney function.  Hyperlipidemia Patient on Azithromycin and Prozac for cholesterol management. Last A1c was 6.6 in August 2024, but patient suspects it may have increased due to recent dietary habits. -Plan to schedule blood work after resolution of flu symptoms to assess current cholesterol levels and A1c.  Diabetes Mellitus Last A1c was 6.6 in August  2024, but patient suspects it may have increased due to recent dietary habits. -Plan to schedule blood work after resolution of flu symptoms to assess current A1c.  General Health Maintenance -Refill lancets for blood glucose monitoring. -Schedule follow-up appointment in April 2025.  Follow-up: No follow-ups on file.  An After Visit Summary was printed and given to the patient.     I,Lauren M Auman,acting as a Neurosurgeon for [employer], [employer].,have documented all relevant documentation on the behalf of Langley Gauss, PA,as directed by  Langley Gauss, PA while in the presence of Langley Gauss, [place].   Langley Gauss, [place] [facility] [phone]

## 2023-08-25 NOTE — Assessment & Plan Note (Signed)
 Positive flu test with symptoms of sore throat and ear pain. Patient self-medicated with over-the-counter medication. -Prescribe Tamiflu, adjusted dose due to potential decreased kidney function.

## 2023-08-25 NOTE — Assessment & Plan Note (Signed)
 Hyperlipidemia Patient on Azithromycin and Prozac for cholesterol management. Last A1c was 6.6 in August 2024, but patient suspects it may have increased due to recent dietary habits. -Plan to schedule blood work after resolution of flu symptoms to assess current cholesterol levels and A1c.  Diabetes Mellitus Last A1c was 6.6 in August 2024, but patient suspects it may have increased due to recent dietary habits. -Plan to schedule blood work after resolution of flu symptoms to assess current A1c.

## 2023-08-25 NOTE — Assessment & Plan Note (Signed)
 Continue to monitor symptoms Continue using over the counter treatment to help with throat pain

## 2023-09-01 ENCOUNTER — Encounter: Payer: Self-pay | Admitting: Physician Assistant

## 2023-09-01 ENCOUNTER — Ambulatory Visit: Payer: Medicare Other | Admitting: Physician Assistant

## 2023-09-01 VITALS — BP 122/72 | HR 61 | Temp 97.3°F | Ht 59.5 in | Wt 141.0 lb

## 2023-09-01 DIAGNOSIS — E782 Mixed hyperlipidemia: Secondary | ICD-10-CM

## 2023-09-01 DIAGNOSIS — J4520 Mild intermittent asthma, uncomplicated: Secondary | ICD-10-CM

## 2023-09-01 DIAGNOSIS — E1159 Type 2 diabetes mellitus with other circulatory complications: Secondary | ICD-10-CM | POA: Diagnosis not present

## 2023-09-01 DIAGNOSIS — I152 Hypertension secondary to endocrine disorders: Secondary | ICD-10-CM | POA: Diagnosis not present

## 2023-09-01 DIAGNOSIS — M797 Fibromyalgia: Secondary | ICD-10-CM

## 2023-09-01 DIAGNOSIS — G8929 Other chronic pain: Secondary | ICD-10-CM

## 2023-09-01 DIAGNOSIS — M791 Myalgia, unspecified site: Secondary | ICD-10-CM

## 2023-09-01 DIAGNOSIS — E1169 Type 2 diabetes mellitus with other specified complication: Secondary | ICD-10-CM

## 2023-09-01 DIAGNOSIS — G4733 Obstructive sleep apnea (adult) (pediatric): Secondary | ICD-10-CM

## 2023-09-01 NOTE — Progress Notes (Unsigned)
 Subjective:  Patient ID: Mindy Ingram, female    DOB: 1946/04/27  Age: 78 y.o. MRN: [id number]  Chief Complaint  Patient presents with   Medical Management of Chronic Issues    Discussed the use of AI scribe software for clinical note transcription with the patient, who gave verbal consent to proceed.  History of Present Illness   The patient, with a history of fibromyalgia and high cholesterol, presents for a chronic follow-up. She reports recent flu-like symptoms which have since resolved. She also complains of ear issues, stating that she has super narrow ear canals which require cleaning every two to three months. Currently, she is experiencing difficulty hearing due to wax build-up in her ears.  The patient also reports issues with her eyes, describing a sensation similar to what she experienced prior to cataract surgery. She has been using prednisolone eye drops which she found to be helpful. However, she stopped using the drops after ten days due to concerns about overuse. The patient also mentions a history of cataract surgery, but the exact timeline is unclear.  Additionally, the patient mentions a history of high cholesterol and is currently on Crestor. She expresses concerns about the cost of the medication and mentions needing assistance to afford it. The patient also mentions a history of fibromyalgia, for which she used to receive injections but has since stopped.        Diabetes:  Not well controlled Glucose checking: regularly Glucose logs:Blood sugar runs between 120-240 Hypoglycemia: no  Most recent A1C: 6.6 Current medications: Metformin XR 500 mg Twice a day. Last Eye Exam:Up to date  Foot checks: daily  Hyperlipidemia: Current medications:Rosuvastatin 5 mg, Zetia 10 mg daily, Fish Oil 1000 mg daily. Intolerant to statins. Patient states she needs Brand name Crestor instead of the Generic because she states that the Generics give her bad Muscle aches and  pains.  GERD: Taking Omeprazole 20 mg daily.  Hypertension: Current medications: Amlodipine 5 mg daily, Aspirin 81 mg daily, Metoprolol 200 mg every day, Losartan-Hctz 100-25 mg daily.  OSA: Sees Dr. Maple Hudson. Home sleep study ordered on 05/27/2022. I do not see the results.  She has continued on CPAP.       08/06/2023   10:51 AM 02/03/2023   11:26 AM 05/26/2022    1:02 PM 02/28/2022    9:20 AM 08/06/2021   10:12 AM  Depression screen PHQ 2/9  Decreased Interest 0 0 0 0 0  Down, Depressed, Hopeless 1 0 0 0 0  PHQ - 2 Score 1 0 0 0 0  Altered sleeping 0 0     Tired, decreased energy 0 0     Change in appetite 0 0     Feeling bad or failure about yourself  0 0     Trouble concentrating 0 0     Moving slowly or fidgety/restless 0 0     Suicidal thoughts 0 0     PHQ-9 Score 1 0     Difficult doing work/chores Not difficult at all Somewhat difficult           08/06/2023   10:50 AM  Fall Risk   Falls in the past year? 0  Number falls in past yr: 0  Injury with Fall? 0  Risk for fall due to : No Fall Risks  Follow up Falls prevention discussed;Falls evaluation completed    Patient Care Team: Langley Gauss, [place] as PCP - General (Physician Assistant) Webb Silversmith, MD (Gastroenterology) Falkner,  Rhona Leavens, MD as Consulting Physician (Rheumatology) Margarette Canada., DPM (Podiatry) Mia Creek, MD as Consulting Physician (Ophthalmology) Rogelio Seen, OD (Optometry) Waymon Budge, MD as Consulting Physician (Pulmonary Disease)   Review of Systems  Constitutional:  Negative for appetite change, fatigue and fever.  HENT:  Positive for ear pain (Bilateral Ears). Negative for congestion, sinus pressure and sore throat.   Eyes:  Positive for pain (Left eye swelling since having Flu).  Respiratory:  Negative for cough, chest tightness, shortness of breath and wheezing.   Cardiovascular:  Negative for chest pain and palpitations.  Gastrointestinal:  Negative for abdominal pain,  constipation, diarrhea, nausea and vomiting.  Genitourinary:  Negative for dysuria and hematuria.  Musculoskeletal:  Negative for arthralgias, back pain, joint swelling and myalgias.  Skin:  Negative for rash.  Neurological:  Negative for dizziness, weakness and headaches.  Psychiatric/Behavioral:  Negative for dysphoric mood. The patient is not nervous/anxious.     Current Outpatient Medications on File Prior to Visit  Medication Sig Dispense Refill   amLODipine (NORVASC) 5 MG tablet TAKE ONE-HALF TABLET BY MOUTH  DAILY 50 tablet 2   aspirin EC 81 MG tablet Take 81 mg by mouth daily.     azelastine (ASTELIN) 0.1 % nasal spray Place 1 spray into both nostrils 2 (two) times daily as needed for allergies or rhinitis.     B-D ULTRA-FINE 33 LANCETS MISC 1 each by Does not apply route in the morning and at bedtime. 180 each 1   Blood Glucose Monitoring Suppl (ONE TOUCH ULTRA 2) w/Device KIT Use as instructed five times daily. E11.69 (Patient taking differently: 1 each as directed. Use as instructed four  times daily. E11.69) 1 kit 0   cetirizine (ZYRTEC) 10 MG tablet Take 1 tablet (10 mg total) by mouth daily as needed for allergies. 90 tablet 3   Cholecalciferol (VITAMIN D PO) Take 2,000 Units by mouth daily.      diclofenac Sodium (VOLTAREN) 1 % GEL Apply 4 g topically 4 (four) times daily as needed (joint pain). 350 g 2   Elderberry 575 MG/5ML SYRP Take 5 mLs by mouth daily.     ezetimibe (ZETIA) 10 MG tablet TAKE 1 TABLET BY MOUTH DAILY 100 tablet 2   fluticasone (FLONASE) 50 MCG/ACT nasal spray USE 1 SPRAY IN EACH NOSTRIL EVERY DAY 48 g 1   Glucose Blood (BLOOD GLUCOSE TEST STRIPS 333) STRP 100 each by In Vitro route 3 (three) times daily before meals. 300 strip 3   losartan-hydrochlorothiazide (HYZAAR) 100-25 MG tablet TAKE 1 TABLET BY MOUTH DAILY 100 tablet 2   metFORMIN (GLUCOPHAGE-XR) 500 MG 24 hr tablet TAKE 1 TABLET BY MOUTH TWICE  DAILY 200 tablet 2   methocarbamol (ROBAXIN) 500 MG  tablet Take 1 tablet (500 mg total) by mouth 2 (two) times daily. 180 tablet 0   metoprolol (TOPROL-XL) 200 MG 24 hr tablet TAKE 1 TABLET(200 MG) BY MOUTH DAILY 90 tablet 1   Multiple Vitamins-Minerals (COMPLETE ENERGY) TABS Take 1 tablet by mouth daily.     nitroGLYCERIN (NITROSTAT) 0.4 MG SL tablet Place 1 tablet (0.4 mg total) under the tongue every 5 (five) minutes as needed for chest pain. 30 tablet 4   Omega-3 Fatty Acids (FISH OIL) 1000 MG CAPS Take 1 capsule by mouth daily.     omeprazole (PRILOSEC) 20 MG capsule TAKE 1 CAPSULE BY MOUTH DAILY 100 capsule 2   traMADol (ULTRAM) 50 MG tablet Take 50 mg by mouth 2 (two)  times daily as needed for pain.     triamcinolone ointment (KENALOG) 0.1 % Apply 1 application topically 2 (two) times daily. 80 g 0   Turmeric (QC TUMERIC COMPLEX PO) Take 1,000 mg by mouth daily. Every other day     No current facility-administered medications on file prior to visit.   Past Medical History:  Diagnosis Date   Acute URI 02/09/2023   Allergic asthma, mild intermittent, uncomplicated 10/17/2007   Office Spirometry 04/03/2015- WNL FVC 2.14/110%, FEV1 1.94/128%, FEV1/FVC 0.91  Office spirometry- 11/04/16--by allergy office- FEV1/FVC 0.77 WNL   Bilateral impacted cerumen 07/01/2018   BMI 29.0-29.9,adult 06/10/2022   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   Cervical radicular pain 01/20/2022   Chronic back pain    Contusion of head, initial encounter 03/18/2021   Corn of toe 06/25/2020   Degeneration of lumbosacral intervertebral disc 01/26/2012   DJD (degenerative joint disease)    DM type 2 with diabetic mixed hyperlipidemia (HCC) 04/08/2021   Dyslipidemia 01/23/2015   Essential hypertension 01/23/2015   Fibromyalgia    GERD 10/17/2007   Qualifier: Diagnosis of  By: Maple Hudson MD, Clinton D    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   Hyperlipidemia 06/25/2020   Hypertension associated with diabetes (HCC) 01/23/2015   Lumbar facet arthropathy  01/20/2022   Lumbar spondylolysis 02/10/2012   Menopause 06/25/2020   Metatarsalgia of both feet 09/18/2016   Multiple benign melanocytic nevi 06/25/2020   Myalgia 03/03/2022   Myalgia due to statin 03/03/2022   Neck pain 12/12/2021   Obesity 07/10/2021   OSA (obstructive sleep apnea)    cpap setting of 10   Pain 03/24/2018   Preop cardiovascular exam 06/10/2022   Right rotator cuff tear 06/25/2020   Right temporal frontal scalp contusions 03/18/2021   S/P lumbar fusion 03/16/2012   Seasonal and perennial allergic rhinitis 10/17/2007   Allergy vaccine 2002- dc'd   Shingles    Shoulder joint pain 06/25/2020   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   Statin myopathy 06/10/2022   Thoracic spine pain 02/23/2013   Type II or unspecified type diabetes mellitus without mention of complication, not stated as uncontrolled    Past Surgical History:  Procedure Laterality Date   APPENDECTOMY  1995   BACK SURGERY  2013   lower back with plates and screws   benign lump right axilla     BILATERAL SALPINGOOPHORECTOMY  1995   CERVICAL SPINE SURGERY  1992   at [facility], slight limitation with turning neck   CHOLECYSTECTOMY  1995   EUS N/A 07/21/2013   Procedure: UPPER ENDOSCOPIC ULTRASOUND (EUS) LINEAR;  Surgeon: Rachael Fee, MD;  Location: [facility];  Service: Endoscopy;  Laterality: N/A;   FOOT SURGERY     2007 - right great toe, 2000 - right fifth digit .   NEUROPLASTY / TRANSPOSITION MEDIAN NERVE AT CARPAL TUNNEL BILATERAL  1995   PARTIAL COLECTOMY  1995   benign adhesions   PARTIAL HYSTERECTOMY  1980s.   abdominal   right foot surgery  2000   right great toe with artificial bone inserted   right knee arthroscopy Right 02/2017    Family History  Problem Relation Age of Onset   Heart disease Mother    Prostate cancer Father    Heart attack Sister    Breast cancer Maternal Aunt    Prostate cancer Paternal Uncle    Allergies Neg Hx     Asthma Neg Hx  Eczema Neg Hx    Immunodeficiency Neg Hx    Social History   Socioeconomic History   Marital status: Widowed    Spouse name: Not on file   Number of children: 1   Years of education: Not on file   Highest education level: Not on file  Occupational History   Occupation: Retired  Tobacco Use   Smoking status: Never   Smokeless tobacco: Never  Vaping Use   Vaping status: Never Used  Substance and Sexual Activity   Alcohol use: No   Drug use: No   Sexual activity: Not on file  Other Topics Concern   Not on file  Social History Narrative   Not on file   Social Drivers of Health   Financial Resource Strain: Low Risk  (08/06/2023)   Overall Financial Resource Strain (CARDIA)    Difficulty of Paying Living Expenses: Not very hard  Food Insecurity: No Food Insecurity (08/06/2023)   Hunger Vital Sign    Worried About Running Out of Food in the Last Year: Never true    Ran Out of Food in the Last Year: Never true  Transportation Needs: No Transportation Needs (08/06/2023)   PRAPARE - Administrator, Civil Service (Medical): No    Lack of Transportation (Non-Medical): No  Physical Activity: Insufficiently Active (08/06/2023)   Exercise Vital Sign    Days of Exercise per Week: 2 days    Minutes of Exercise per Session: 40 min  Stress: No Stress Concern Present (08/06/2023)   [employer] of Occupational Health - Occupational Stress Questionnaire    Feeling of Stress : Not at all  Social Connections: Moderately Isolated (08/06/2023)   Social Connection and Isolation Panel [NHANES]    Frequency of Communication with Friends and Family: More than three times a week    Frequency of Social Gatherings with Friends and Family: More than three times a week    Attends Religious Services: More than 4 times per year    Active Member of [employer] or Organizations: No    Attends Banker Meetings: Never    Marital Status: Widowed    Objective:  BP 122/72  (BP Location: Left Arm, Patient Position: Sitting)   Pulse 61   Temp (!) 97.3 F (36.3 C) (Temporal)   Ht 4' 11.5" (1.511 m)   Wt 141 lb (64 kg)   SpO2 98%   BMI 28.00 kg/m      09/01/2023    2:51 PM 08/25/2023    3:24 PM 08/06/2023   10:43 AM  BP/Weight  Systolic BP 122 132   Diastolic BP 72 78   Wt. (Lbs) 141 142.8 150  BMI 28 kg/m2 28.36 kg/m2 29.79 kg/m2    Physical Exam Vitals reviewed.  Constitutional:      Appearance: Normal appearance.  Eyes:     General: Lids are normal. Lids are everted, no foreign bodies appreciated. No scleral icterus.       Right eye: No foreign body or discharge.        Left eye: No foreign body or discharge.     Extraocular Movements:     Right eye: Normal extraocular motion.     Left eye: Normal extraocular motion.     Comments: Edema noted above and below the eye No pain or difficulty moving the eye   Cardiovascular:     Rate and Rhythm: Normal rate and regular rhythm.     Heart sounds: Normal heart sounds.  Pulmonary:  Effort: Pulmonary effort is normal.     Breath sounds: Normal breath sounds.  Abdominal:     General: Bowel sounds are normal.     Palpations: Abdomen is soft.     Tenderness: There is no abdominal tenderness.  Neurological:     Mental Status: She is alert and oriented to person, place, and time.  Psychiatric:        Mood and Affect: Mood normal.        Behavior: Behavior normal.     Diabetic Foot Exam - Simple   No data filed      Lab Results  Component Value Date   WBC 4.3 01/29/2023   HGB 12.5 01/29/2023   HCT 38.8 01/29/2023   PLT 303 01/29/2023   GLUCOSE 110 (H) 01/29/2023   CHOL 153 01/29/2023   TRIG 126 01/29/2023   HDL 45 01/29/2023   LDLCALC 86 01/29/2023   ALT 15 01/29/2023   AST 19 01/29/2023   NA 143 01/29/2023   K 3.8 01/29/2023   CL 101 01/29/2023   CREATININE 1.02 (H) 01/29/2023   BUN 18 01/29/2023   CO2 24 01/29/2023   TSH 1.370 11/19/2021   INR 0.9 06/10/2022   HGBA1C 6.6  (H) 01/29/2023   MICROALBUR 10 12/25/2020      Assessment & Plan:  DM type 2 with diabetic mixed hyperlipidemia (HCC) Assessment & Plan: Well controlled.  Continue to work on eating a healthy diet and exercise.  Labs drawn today.   No major side effects reported, and no issues with compliance. The current medical regimen is effective;  continue present plan with Metformin 500mg  Will adjust medication as needed depending on labs Lab Results  Component Value Date   HGBA1C 6.6 (H) 01/29/2023   HGBA1C 7.6 (H) 06/10/2022   HGBA1C 7.4 (H) 02/28/2022      Orders: -     Hemoglobin A1c -     Microalbumin / creatinine urine ratio -     AMB Referral VBCI Care Management  Hypertension associated with diabetes [facility]) Assessment & Plan: Well controlled.  Continue to work on eating a healthy diet and exercise.  Labs drawn today.   No major side effects reported, and no issues with compliance. The current medical regimen is effective;  continue present plan with Metoprolol 200mg , amlodipine 5mg , Hyzaar Will adjust medication as needed depending on labs BP Readings from Last 3 Encounters:  09/01/23 122/72  08/25/23 132/78  02/03/23 126/70     Orders: -     CBC with Differential/Platelet -     Comprehensive metabolic panel  Mixed hyperlipidemia Assessment & Plan: Requires Crestor, needs assistance for medication affordability. Coordinated with pharmacy for assistance program. - Coordinate with pharmacy for medication assistance program. - Prescribe Crestor once assistance is confirmed. Lab Results  Component Value Date   LDLCALC 86 01/29/2023     Orders: -     Lipid panel -     Crestor; Take 1 tablet (10 mg total) by mouth daily.  Dispense: 90 tablet; Refill: 3 -     AMB Referral VBCI Care Management  Fibromyalgia Assessment & Plan: Manages fibromyalgia without disability, remains active. - Continue current management plan.   Allergic asthma, mild intermittent,  uncomplicated Assessment & Plan: Eye Inflammation Possible eye inflammation with no infection or irritants. Discussed potential cataract recurrence and need for ophthalmologist evaluation. - Perform eye examination. - Refer to ophthalmologist.  Cataracts Potential cataract recurrence. Discussed need for ophthalmologist evaluation. - Refer to ophthalmologist.  Meds ordered this encounter  Medications   CRESTOR 10 MG tablet    Sig: Take 1 tablet (10 mg total) by mouth daily.    Dispense:  90 tablet    Refill:  3    Orders Placed This Encounter  Procedures   CBC with Differential/Platelet   Comprehensive metabolic panel   Lipid panel   Hemoglobin A1c   Microalbumin / creatinine urine ratio   AMB Referral VBCI Care Management     Follow-up: No follow-ups on file.   I,Lauren M Auman,acting as a Neurosurgeon for [employer], PA.,have documented all relevant documentation on the behalf of Langley Gauss, PA,as directed by  Langley Gauss, PA while in the presence of Langley Gauss, [place].   An After Visit Summary was printed and given to the patient.  Langley Gauss, [place] [facility] [phone]

## 2023-09-02 LAB — LIPID PANEL
Chol/HDL Ratio: 2.8 ratio (ref 0.0–4.4)
Cholesterol, Total: 122 mg/dL (ref 100–199)
HDL: 44 mg/dL (ref >39)
LDL Chol Calc (NIH): 51 mg/dL (ref 0–99)
Triglycerides: 159 mg/dL — ABNORMAL HIGH (ref 0–149)
VLDL Cholesterol Cal: 27 mg/dL (ref 5–40)

## 2023-09-02 LAB — HEMOGLOBIN A1C
Est. average glucose Bld gHb Est-mCnc: 171 mg/dL
Hgb A1c MFr Bld: 7.6 % — ABNORMAL HIGH (ref 4.8–5.6)

## 2023-09-02 LAB — COMPREHENSIVE METABOLIC PANEL
ALT: 28 IU/L (ref 0–32)
AST: 24 IU/L (ref 0–40)
Albumin: 4.3 g/dL (ref 3.8–4.8)
Alkaline Phosphatase: 51 IU/L (ref 44–121)
BUN/Creatinine Ratio: 17 (ref 12–28)
BUN: 16 mg/dL (ref 8–27)
Bilirubin Total: 0.3 mg/dL (ref 0.0–1.2)
CO2: 23 mmol/L (ref 20–29)
Calcium: 9.5 mg/dL (ref 8.7–10.3)
Chloride: 101 mmol/L (ref 96–106)
Creatinine, Ser: 0.92 mg/dL (ref 0.57–1.00)
Globulin, Total: 2.6 g/dL (ref 1.5–4.5)
Glucose: 85 mg/dL (ref 70–99)
Potassium: 3.9 mmol/L (ref 3.5–5.2)
Sodium: 141 mmol/L (ref 134–144)
Total Protein: 6.9 g/dL (ref 6.0–8.5)
eGFR: 64 mL/min/{1.73_m2} (ref >59)

## 2023-09-02 LAB — CBC WITH DIFFERENTIAL/PLATELET
Basophils Absolute: 0 10*3/uL (ref 0.0–0.2)
Basos: 0 %
EOS (ABSOLUTE): 0.1 10*3/uL (ref 0.0–0.4)
Eos: 1 %
Hematocrit: 39.3 % (ref 34.0–46.6)
Hemoglobin: 12.9 g/dL (ref 11.1–15.9)
Immature Grans (Abs): 0 10*3/uL (ref 0.0–0.1)
Immature Granulocytes: 0 %
Lymphocytes Absolute: 3 10*3/uL (ref 0.7–3.1)
Lymphs: 55 %
MCH: 30.6 pg (ref 26.6–33.0)
MCHC: 32.8 g/dL (ref 31.5–35.7)
MCV: 93 fL (ref 79–97)
Monocytes Absolute: 0.5 10*3/uL (ref 0.1–0.9)
Monocytes: 8 %
Neutrophils Absolute: 2 10*3/uL (ref 1.4–7.0)
Neutrophils: 36 %
Platelets: 292 10*3/uL (ref 150–450)
RBC: 4.21 x10E6/uL (ref 3.77–5.28)
RDW: 12.6 % (ref 11.7–15.4)
WBC: 5.5 10*3/uL (ref 3.4–10.8)

## 2023-09-02 LAB — MICROALBUMIN / CREATININE URINE RATIO
Creatinine, Urine: 118.1 mg/dL
Microalb/Creat Ratio: 6 mg/g{creat} (ref 0–29)
Microalbumin, Urine: 6.6 ug/mL

## 2023-09-02 MED ORDER — CRESTOR 10 MG PO TABS: 10.0000 mg | ORAL_TABLET | Freq: Every day | ORAL | 3 refills | Status: DC

## 2023-09-02 NOTE — Assessment & Plan Note (Signed)
 Well controlled.  Continue to work on eating a healthy diet and exercise.  Labs drawn today.   No major side effects reported, and no issues with compliance. The current medical regimen is effective;  continue present plan with Metoprolol 200mg , amlodipine 5mg , Hyzaar Will adjust medication as needed depending on labs BP Readings from Last 3 Encounters:  09/01/23 122/72  08/25/23 132/78  02/03/23 126/70

## 2023-09-02 NOTE — Assessment & Plan Note (Signed)
 Eye Inflammation Possible eye inflammation with no infection or irritants. Discussed potential cataract recurrence and need for ophthalmologist evaluation. - Perform eye examination. - Refer to ophthalmologist.  Cataracts Potential cataract recurrence. Discussed need for ophthalmologist evaluation. - Refer to ophthalmologist.

## 2023-09-02 NOTE — Assessment & Plan Note (Signed)
 Well controlled.  Continue to work on eating a healthy diet and exercise.  Labs drawn today.   No major side effects reported, and no issues with compliance. The current medical regimen is effective;  continue present plan with Metformin 500mg  Will adjust medication as needed depending on labs Lab Results  Component Value Date   HGBA1C 6.6 (H) 01/29/2023   HGBA1C 7.6 (H) 06/10/2022   HGBA1C 7.4 (H) 02/28/2022

## 2023-09-02 NOTE — Assessment & Plan Note (Signed)
 Manages fibromyalgia without disability, remains active. - Continue current management plan.

## 2023-09-02 NOTE — Assessment & Plan Note (Addendum)
 Requires Crestor, needs assistance for medication affordability. Coordinated with pharmacy for assistance program. - Coordinate with pharmacy for medication assistance program. - Prescribe Crestor once assistance is confirmed. Lab Results  Component Value Date   LDLCALC 86 01/29/2023

## 2023-09-03 ENCOUNTER — Telehealth: Payer: Self-pay

## 2023-09-03 NOTE — Progress Notes (Signed)
 Care Guide Pharmacy Note  09/03/2023 Name: Mindy Ingram MRN: [id number] DOB: 09-20-1945  Referred By: Langley Gauss, PA Reason for referral: Care Coordination (Outreach to schedule with pharm d )   Mindy Ingram is a 78 y.o. year old female who is a primary care patient of Langley Gauss, [place].  Mindy Ingram was referred to the pharmacist for assistance related to: HLD and DMII  An unsuccessful telephone outreach was attempted today to contact the patient who was referred to the pharmacy team for assistance with medication assistance. Additional attempts will be made to contact the patient.  Penne Lash , RMA     [facility]  [facility], [facility] Guide  Direct Dial: [phone]  Website: [email or web address]

## 2023-09-03 NOTE — Progress Notes (Signed)
 Care Guide Pharmacy Note  09/03/2023 Name: Mindy Ingram MRN: [id number] DOB: Oct 01, 1945  Referred By: Langley Gauss, PA Reason for referral: Care Coordination (Outreach to schedule with pharm d )   Mindy Ingram is a 78 y.o. year old female who is a primary care patient of Langley Gauss, [place].  Mindy Ingram was referred to the pharmacist for assistance related to: HLD and DMII  Successful contact was made with the patient to discuss pharmacy services including being ready for the pharmacist to call at least 5 minutes before the scheduled appointment time and to have medication bottles and any blood pressure readings ready for review. The patient agreed to meet with the pharmacist via telephone visit on (date/time).09/10/2023  Penne Lash , RMA     [place]  [facility], [facility] Guide  Direct Dial: [phone]  Website: [email or web address]

## 2023-09-07 ENCOUNTER — Ambulatory Visit: Payer: Self-pay | Admitting: Physician Assistant

## 2023-09-07 NOTE — Telephone Encounter (Signed)
 Pt called in today about her lab results from 3/4. RN shared with pt the results and the PCP's notes about those results.  Pt mentioned that she is taking her rosuvastatin sparingly d/t muscle aches. Office visit on 3/4 states that pt will be prescribed Crestor once medication assistance has been arranged. Pt would like follow-up on the status of that process and if there is anything she needs to do at this time.   Copied from CRM [id number]. Topic: Clinical - Lab/Test Results >> Sep 07, 2023  4:07 PM [place] B wrote: Reason for CRM: pt called in for lab results Reason for Disposition  Health Information question, no triage required and triager able to answer question  Answer Assessment - Initial Assessment Questions 1. REASON FOR CALL or QUESTION: "What is your reason for calling today?" or "How can I best help you?" or "What question do you have that I can help answer?"     Pt calling in about her lab results. RN shared with pt Huston Foley Craft's notes about pt's labs. Pt verbalized understanding.   RN advised pt her cholesterol is elevated. Pt states that the provider needs to complete paperwork so that she can afford Crestor. Notes from 3/4 office visit state that Crestor can be prescribed once medication assistance is coordinated since the medication is $300, according to the pt. Pt is currently taking rosuvastatin but it gives her muscle pains so she is taking it sparingly.   Pt would like follow-up about the status of Crestor and if there is anything she needs to do at this time.  Protocols used: Information Only Call - No Triage-A-AH

## 2023-09-10 ENCOUNTER — Other Ambulatory Visit: Payer: Self-pay

## 2023-09-10 NOTE — Progress Notes (Signed)
 09/10/2023 Name: Mindy Ingram MRN: [id number] DOB: Dec 12, 1945  Chief Complaint  Patient presents with   Medication Assistance    Mindy Ingram is a 78 y.o. year old female who presented for a telephone visit.   They were referred to the pharmacist by their PCP for assistance in managing medication access.   Subjective:  Care Team: Primary Care Provider: Langley Gauss, [place] ; Next Scheduled Visit: 03/03/2024  Medication Access/Adherence  Current Pharmacy:  [id number] [place] - [facility], [place] - [address] [place] [address] Phone: [phone] Fax: [phone]  [facility] - [place], [place] - [address] [place] [address] Phone: [phone] Fax: [phone]  -Patient reports affordability concerns with their medications: Yes  -Patient reports access/transportation concerns to their pharmacy: No  -Patient reports adherence concerns with their medications:  Yes    Diabetes: Current medications: metformin xr 500mg  BID -Patient states she was recently given Ozempic sample but has not started b/c of concern with potential side effects -Most recent A1c 7.6% on 3/5  Hypertension: Current medications: losartan/hydrochlorothiazide 50/12.5mg  daily, amlodipine 2.5mg  when needed, metoprolol xl 200mg  daily -Medications previously tried: valsartan/hydrochlorothiazide and would prefer to go back to this medication- states she is unsure why this was stopped -Losartan/hydrochlorothiazide is prescribed to take 100/25mg  daily, but patient is taking one-half tablet.  States BP goes too low if she takes whole tablet -Amlodipine prescribed to take one-half of 5mg  daily, but patient only takes if SBP>140 -Patient does monitor home BP regularly and readings average 124/67 with current regimen  Hyperlipidemia/ASCVD Risk Reduction Current lipid lowering medications: Crestor 10mg  daily, ezetimibe  10mg  daily -Medications tried in the past: cannot tolerate any generic statin medication due to severe myalgias Antiplatelet regimen: ASA 81mg  daily -Currently well-controlled on Crestor 10mg  daily, but patient states copay has recently increased to >$300, which she cannot afford long-term -Patient inquiring about necessity of 2 different cholesterol medications  Objective:  Lab Results  Component Value Date   HGBA1C 7.6 (H) 09/01/2023   Lab Results  Component Value Date   CREATININE 0.92 09/01/2023   BUN 16 09/01/2023   NA 141 09/01/2023   K 3.9 09/01/2023   CL 101 09/01/2023   CO2 23 09/01/2023   Lab Results  Component Value Date   CHOL 122 09/01/2023   HDL 44 09/01/2023   LDLCALC 51 09/01/2023   TRIG 159 (H) 09/01/2023   CHOLHDL 2.8 09/01/2023   Medications Reviewed Today     Reviewed by [name], RPH (Pharmacist) on 09/10/23 at 1118  Med List Status: <None>   Medication Order Taking? Sig Documenting Provider Last Dose Status Informant  amLODipine (NORVASC) 5 MG tablet [id number] Yes TAKE ONE-HALF TABLET BY MOUTH  DAILY Cox, Kirsten, MD Taking Active   aspirin EC 81 MG tablet [id number]  Take 81 mg by mouth daily. [provider]  Active   azelastine (ASTELIN) 0.1 % nasal spray [id number]  Place 1 spray into both nostrils 2 (two) times daily as needed for allergies or rhinitis. [provider]  Active   B-D ULTRA-FINE 33 LANCETS MISC [id number]  1 each by Does not apply route in the morning and at bedtime. Langley Gauss, PA  Active   Blood Glucose Monitoring Suppl (ONE TOUCH ULTRA 2) w/Device KIT [id number] Yes Use as instructed five times daily. E11.69  Patient taking differently: 1 each as directed. Use as instructed four  times daily. E11.69   CoxFritzi Mandes, MD Taking Active   cetirizine (ZYRTEC) 10 MG tablet [id number]  Take 1 tablet (10 mg total) by mouth daily as needed for allergies. Lurline Del, FNP  Active   Cholecalciferol (VITAMIN D PO)  [id number]  Take 2,000 Units by mouth daily.  [provider]  Active   CRESTOR 10 MG tablet [id number] Yes Take 1 tablet (10 mg total) by mouth daily. Langley Gauss, PA Taking Active   diclofenac Sodium (VOLTAREN) 1 % GEL [id number]  Apply 4 g topically 4 (four) times daily as needed (joint pain). Blane Ohara, MD  Active   Elderberry 575 MG/5ML SYRP [id number]  Take 5 mLs by mouth daily. [provider]  Active   ezetimibe (ZETIA) 10 MG tablet [id number] Yes TAKE 1 TABLET BY MOUTH DAILY Cox, Kirsten, MD Taking Active   fluticasone (FLONASE) 50 MCG/ACT nasal spray [id number]  USE 1 SPRAY IN EACH NOSTRIL EVERY DAY Cox, Kirsten, MD  Active   Glucose Blood (BLOOD GLUCOSE TEST STRIPS 333) STRP [id number] Yes 100 each by In Vitro route 3 (three) times daily before meals. Cox, Kirsten, MD Taking Active   losartan-hydrochlorothiazide [facility]) 100-25 MG tablet [id number] Yes TAKE 1 TABLET BY MOUTH DAILY  Patient taking differently: Take 0.5 tablets by mouth daily.   Cox, Kirsten, MD Taking Active   metFORMIN (GLUCOPHAGE-XR) 500 MG 24 hr tablet [id number] Yes TAKE 1 TABLET BY MOUTH TWICE  DAILY Sirivol, Mamatha, MD Taking Active   methocarbamol (ROBAXIN) 500 MG tablet [id number]  Take 1 tablet (500 mg total) by mouth 2 (two) times daily. Cox, Kirsten, MD  Active   metoprolol (TOPROL-XL) 200 MG 24 hr tablet [id number] Yes TAKE 1 TABLET(200 MG) BY MOUTH DAILY Cox, Kirsten, MD Taking Active   Multiple Vitamins-Minerals (COMPLETE ENERGY) TABS [phone]  Take 1 tablet by mouth daily. [provider]  Active Self  nitroGLYCERIN (NITROSTAT) 0.4 MG SL tablet [id number]  Place 1 tablet (0.4 mg total) under the tongue every 5 (five) minutes as needed for chest pain. Revankar, Aundra Dubin, MD  Active   Omega-3 Fatty Acids (FISH OIL) 1000 MG CAPS [id number]  Take 1 capsule by mouth daily. [provider]  Active   omeprazole (PRILOSEC) 20 MG capsule [id number]  TAKE 1 CAPSULE BY MOUTH DAILY Cox, Kirsten,  MD  Active   traMADol (ULTRAM) 50 MG tablet [id number]  Take 50 mg by mouth 2 (two) times daily as needed for pain. [provider]  Active   triamcinolone ointment (KENALOG) 0.1 % [id number]  Apply 1 application topically 2 (two) times daily. Cox, Kirsten, MD  Active   Turmeric (QC TUMERIC COMPLEX PO) [id number]  Take 1,000 mg by mouth daily. Every other day [provider]  Active   Med List Note Maple Hudson, Clinton D, MD 03/09/11 1408): CPAP 12, reduced to 10 as of 03/06/2011           Assessment/Plan:   Diabetes: -Currently uncontrolled -Suggested patient reconsider trying Ozempic sample provided; educated if she does experience unwanted side effects, medication can be stopped at anytime -Based on [facility], she would qualify for Novo PAP for Ozempic- made her aware   Hypertension: -Currently controlled -Recommend changing losartan/hydrochlorothiazide 100/25mg  daily to valsartan/hydrochlorothiazide 80/12.5mg  daily based on patient preference and current BP control -Continue amlodipine 2.5mg  daily and metoprolol xl 200mg  daily -Continue to monitor and record home BP daily  Hyperlipidemia/ASCVD Risk Reduction: -Currently controlled.  -Recommend increasing Crestor to 20mg  daily and stopping ezetimibe based on  current control and patient desire to decrease pill burden -Patients has state health plan, which will not cover brand Crestor even with PA.  She also has Medicare advantage plan.  Based on Medicare enrollment and HHI, she qualifies for [employer] for copay assistance.  Enrolled as of today max benefit $2500/year- good through 08/09/2024.  Kennedy Bucker information saved under [employer].  Will provide processing information to pharmacy once determined if dose will be changing.  Follow Up Plan: Will contact patient to further discuss plan after consulting with PCP and will schedule f/u appropriately  [name], PharmD, DPLA

## 2023-09-14 NOTE — Progress Notes (Unsigned)
 09/14/2023 Name: Mindy Ingram MRN: [id number] DOB: 21-Feb-1946  No chief complaint on file.   Mindy Ingram is a 78 y.o. year old female who presented for a telephone visit.   They were referred to the pharmacist by their PCP for assistance in managing medication access.   Subjective:  Care Team: Primary Care Provider: Langley Gauss, [place] ; Next Scheduled Visit: 03/03/2024  Medication Access/Adherence  Current Pharmacy:  [facility] #[id number] [facility], [place] - [address] [place] [address] Phone: [phone] Fax: [phone]  [facility] - [place], [place] - [address] [place]  [address] Phone: [phone] Fax: [phone]  -Patient reports affordability concerns with their medications: Yes  -Patient reports access/transportation concerns to their pharmacy: No  -Patient reports adherence concerns with their medications:  Yes    Diabetes: last [facility] appt 3/13 See if want to go forward with ozempic PAP  - after getting the samples, how have u been tolerating ozempic?  It does suppress ur appetite - which is what leads to weat loss  When u start it - pay attention to not  overeat, eat slowly, eat half of what u normally eat wait then restart  o If overeat , fried foods - can lead to nausea   Doesn't cause low BG -    A1C = Check Clinic BG Review medications and adherence (timing of meds, etc.)  Ate or drank anything prior to visit today? At home BGs?  Highs Lows  Hyperglycemia sx (nocturia, neuropathy, visual changes, foot exams) Hypoglycemia symptoms (dizziness, shaky, sweating, hungry, confusion) Diet Exercise  Current medications: metformin xr 500mg  BID -Patient states she was recently given Ozempic sample but has not started b/c of concern with potential side effects -Most recent A1c 7.6% on 3/5  Hypertension:  Compliance? -still taking amlodpine only when SBP  <140? - are we waiting for PCP approval to change losartan > vals/hcz? Took meds this morning? When do you take your meds?  Check Clinic BP? Home BP logs? If no logs, bring to next visit w/ BP cuff Go over BP goals Any low Bps  Dizziness, headaches, blurred vision? History of swelling? Recent change to valsartan/htz - how tolerating? Diet??  Exercise??   Current medications: losartan/hydrochlorothiazide 50/12.5mg  daily, amlodipine 2.5mg  when needed, metoprolol xl 200mg  daily -Medications previously tried: valsartan/hydrochlorothiazide and would prefer to go back to this medication- states she is unsure why this was stopped -Losartan/hydrochlorothiazide is prescribed to take 100/25mg  daily, but patient is taking one-half tablet.  States BP goes too low if she takes whole tablet -Amlodipine prescribed to take one-half of 5mg  daily, but patient only takes if SBP>140 -Patient does monitor home BP regularly and readings average 124/67 with current regimen  Hyperlipidemia/ASCVD Risk Reduction Compliance? - crestor not changed to 20  and told to stop zetia to dec pill burden  Muscle pain? Meds tried in the past and any side effects? Go over labs and goals   Enrol in [employer] grant?  Order Lab F/u appt in 3 months (lipid panel and LFTs)  Current lipid lowering medications: Crestor 10mg  daily, ezetimibe 10mg  daily -Medications tried in the past: cannot tolerate any generic statin medication due to severe myalgias Antiplatelet regimen: ASA 81mg  daily -Currently well-controlled on Crestor 10mg  daily, but patient states copay has recently increased to >$300, which she cannot afford long-term -Patient inquiring about necessity of 2 different cholesterol medications  Objective:  Lab Results  Component Value Date   HGBA1C 7.6 (H) 09/01/2023   Lab Results  Component Value Date   CREATININE 0.92 09/01/2023   BUN 16 09/01/2023   NA 141 09/01/2023   K 3.9 09/01/2023    CL 101 09/01/2023   CO2 23 09/01/2023   Lab Results  Component Value Date   CHOL 122 09/01/2023   HDL 44 09/01/2023   LDLCALC 51 09/01/2023   TRIG 159 (H) 09/01/2023   CHOLHDL 2.8 09/01/2023   Medications Reviewed Today   Medications were not reviewed in this encounter    Assessment/Plan:   Diabetes: -Currently uncontrolled -Suggested patient reconsider trying Ozempic sample provided; educated if she does experience unwanted side effects, medication can be stopped at anytime -Based on [facility], she would qualify for Novo PAP for Ozempic- made her aware   Hypertension: -Currently controlled -Recommend changing losartan/hydrochlorothiazide 100/25mg  daily to valsartan/hydrochlorothiazide 80/12.5mg  daily based on patient preference and current BP control -Continue amlodipine 2.5mg  daily and metoprolol xl 200mg  daily -Continue to monitor and record home BP daily  Hyperlipidemia/ASCVD Risk Reduction: -Currently controlled.  -Recommend increasing Crestor to 20mg  daily and stopping ezetimibe based on current control and patient desire to decrease pill burden -Patients has state health plan, which will not cover brand Crestor even with PA.  She also has Medicare advantage plan.  Based on Medicare enrollment and HHI, she qualifies for [employer] for copay assistance.  Enrolled as of today max benefit $2500/year- good through 08/09/2024.  Kennedy Bucker information saved under [employer].  Will provide processing information to pharmacy once determined if dose will be changing.  Follow Up Plan: Will contact patient to further discuss plan after consulting with PCP and will schedule f/u appropriately  Verdene Rio, PharmD PGY1 Pharmacy Resident

## 2023-09-15 ENCOUNTER — Other Ambulatory Visit: Payer: Self-pay

## 2023-09-15 ENCOUNTER — Ambulatory Visit (INDEPENDENT_AMBULATORY_CARE_PROVIDER_SITE_OTHER): Admitting: Family Medicine

## 2023-09-15 ENCOUNTER — Encounter: Payer: Self-pay | Admitting: Family Medicine

## 2023-09-15 VITALS — BP 130/68 | HR 95 | Temp 98.1°F | Resp 16 | Ht 59.5 in | Wt 144.4 lb

## 2023-09-15 DIAGNOSIS — R053 Chronic cough: Secondary | ICD-10-CM

## 2023-09-15 DIAGNOSIS — Z8709 Personal history of other diseases of the respiratory system: Secondary | ICD-10-CM

## 2023-09-15 HISTORY — DX: Chronic cough: R05.3

## 2023-09-15 HISTORY — DX: Personal history of other diseases of the respiratory system: Z87.09

## 2023-09-15 NOTE — Progress Notes (Signed)
 Acute Office Visit  Subjective:    Patient ID: Mindy Ingram, female    DOB: [birth year], 78 y.o.   MRN: [id number]  Chief Complaint  Patient presents with   Cough    Productive white and clear phlegm    Discussed the use of AI scribe software for clinical note transcription with the patient, who gave verbal consent to proceed.  HPI: Mindy Ingram is a 78 year old female with asthma who presents with a persistent cough. Patient presents today with a persistent cough since her flu diagnosis on Feb 25th. Patient states that when she is trying to lay down and sleep she feels a "hanging" feeling, "vibrating" feeling in her throat. She also stated she coughs up clear, white phlegm.  She has been experiencing a persistent cough since having the flu. The cough is primarily dry with occasional sputum production, described as 'after one cough, nothing else comes up.' She feels soreness in her chest due to the cough and mentions a sensation of something 'swinging back and forth' when lying down. She has been using Tamiflu for the flu and over-the-counter medication containing cough medicine suitable for people with high blood pressure. No shortness of breath, wheezing, or fever is present. Her ears feel full, and they were recently cleaned, a routine procedure every two months due to the small size of her ear canals.  She has a history of asthma but has not used an inhaler in over a year. She uses Flonase and Zyrtec for her symptoms and occasionally uses Astelin, although she has not used it in the past eight months.  She sleeps with a CPAP machine for about ten hours a day and reports good sleep quality. Her blood sugar has been more elevated recently.  She is proactive about her health, using supplements like elderberry and turmeric, and drinks lemon and ginger tea daily. She engages in regular physical activity and is mindful of her diet.  Past Medical History:  Diagnosis Date   Acute  URI 02/09/2023   Allergic asthma, mild intermittent, uncomplicated 10/17/2007   Office Spirometry 04/03/2015- WNL FVC 2.14/110%, FEV1 1.94/128%, FEV1/FVC 0.91  Office spirometry- 11/04/16--by allergy office- FEV1/FVC 0.77 WNL   Bilateral impacted cerumen 07/01/2018   BMI 29.0-29.9,adult 06/10/2022   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   Cervical radicular pain 01/20/2022   Chronic back pain    Contusion of head, initial encounter 03/18/2021   Corn of toe 06/25/2020   Degeneration of lumbosacral intervertebral disc 01/26/2012   DJD (degenerative joint disease)    DM type 2 with diabetic mixed hyperlipidemia (HCC) 04/08/2021   Dyslipidemia 01/23/2015   Essential hypertension 01/23/2015   Fibromyalgia    GERD 10/17/2007   Qualifier: Diagnosis of  By: Maple Hudson MD, Clinton D    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   Hyperlipidemia 06/25/2020   Hypertension associated with diabetes (HCC) 01/23/2015   Lumbar facet arthropathy 01/20/2022   Lumbar spondylolysis 02/10/2012   Menopause 06/25/2020   Metatarsalgia of both feet 09/18/2016   Multiple benign melanocytic nevi 06/25/2020   Myalgia 03/03/2022   Myalgia due to statin 03/03/2022   Neck pain 12/12/2021   Obesity 07/10/2021   OSA (obstructive sleep apnea)    cpap setting of 10   Pain 03/24/2018   Preop cardiovascular exam 06/10/2022   Right rotator cuff tear 06/25/2020   Right temporal frontal scalp contusions 03/18/2021   S/P lumbar fusion 03/16/2012   Seasonal and perennial allergic rhinitis 10/17/2007  Allergy vaccine 2002- dc'd   Shingles    Shoulder joint pain 06/25/2020   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   Statin myopathy 06/10/2022   Thoracic spine pain 02/23/2013   Type II or unspecified type diabetes mellitus without mention of complication, not stated as uncontrolled     Past Surgical History:  Procedure Laterality Date   APPENDECTOMY  1995   BACK SURGERY  2013    lower back with plates and screws   benign lump right axilla     BILATERAL SALPINGOOPHORECTOMY  1995   CERVICAL SPINE SURGERY  1992   at [facility], slight limitation with turning neck   CHOLECYSTECTOMY  1995   EUS N/A 07/21/2013   Procedure: UPPER ENDOSCOPIC ULTRASOUND (EUS) LINEAR;  Surgeon: Rachael Fee, MD;  Location: [facility];  Service: Endoscopy;  Laterality: N/A;   FOOT SURGERY     2007 - right great toe, 2000 - right fifth digit .   NEUROPLASTY / TRANSPOSITION MEDIAN NERVE AT CARPAL TUNNEL BILATERAL  1995   PARTIAL COLECTOMY  1995   benign adhesions   PARTIAL HYSTERECTOMY  1980s.   abdominal   right foot surgery  2000   right great toe with artificial bone inserted   right knee arthroscopy Right 02/2017    Family History  Problem Relation Age of Onset   Heart disease Mother    Prostate cancer Father    Heart attack Sister    Breast cancer Maternal Aunt    Prostate cancer Paternal Uncle    Allergies Neg Hx    Asthma Neg Hx    Eczema Neg Hx    Immunodeficiency Neg Hx     Social History   Socioeconomic History   Marital status: Widowed    Spouse name: Not on file   Number of children: 1   Years of education: Not on file   Highest education level: Not on file  Occupational History   Occupation: Retired  Tobacco Use   Smoking status: Never   Smokeless tobacco: Never  Vaping Use   Vaping status: Never Used  Substance and Sexual Activity   Alcohol use: No   Drug use: No   Sexual activity: Not on file  Other Topics Concern   Not on file  Social History Narrative   Not on file   Social Drivers of Health   Financial Resource Strain: Low Risk  (08/06/2023)   Overall Financial Resource Strain (CARDIA)    Difficulty of Paying Living Expenses: Not very hard  Food Insecurity: No Food Insecurity (08/06/2023)   Hunger Vital Sign    Worried About Running Out of Food in the Last Year: Never true    Ran Out of Food in the Last Year: Never true  Transportation  Needs: No Transportation Needs (08/06/2023)   PRAPARE - Administrator, Civil Service (Medical): No    Lack of Transportation (Non-Medical): No  Physical Activity: Insufficiently Active (08/06/2023)   Exercise Vital Sign    Days of Exercise per Week: 2 days    Minutes of Exercise per Session: 40 min  Stress: No Stress Concern Present (08/06/2023)   [employer] of Occupational Health - Occupational Stress Questionnaire    Feeling of Stress : Not at all  Social Connections: Moderately Isolated (08/06/2023)   Social Connection and Isolation Panel [NHANES]    Frequency of Communication with Friends and Family: More than three times a week    Frequency of Social Gatherings  with Friends and Family: More than three times a week    Attends Religious Services: More than 4 times per year    Active Member of Clubs or Organizations: No    Attends Banker Meetings: Never    Marital Status: Widowed  Intimate Partner Violence: Not At Risk (08/06/2023)   Humiliation, Afraid, Rape, and Kick questionnaire    Fear of Current or Ex-Partner: No    Emotionally Abused: No    Physically Abused: No    Sexually Abused: No    Outpatient Medications Prior to Visit  Medication Sig Dispense Refill   amLODipine (NORVASC) 5 MG tablet TAKE ONE-HALF TABLET BY MOUTH  DAILY 50 tablet 2   aspirin EC 81 MG tablet Take 81 mg by mouth daily.     azelastine (ASTELIN) 0.1 % nasal spray Place 1 spray into both nostrils 2 (two) times daily as needed for allergies or rhinitis.     B-D ULTRA-FINE 33 LANCETS MISC 1 each by Does not apply route in the morning and at bedtime. 180 each 1   Blood Glucose Monitoring Suppl (ONE TOUCH ULTRA 2) w/Device KIT Use as instructed five times daily. E11.69 (Patient taking differently: 1 each as directed. Use as instructed four  times daily. E11.69) 1 kit 0   cetirizine (ZYRTEC) 10 MG tablet Take 1 tablet (10 mg total) by mouth daily as needed for allergies. 90 tablet 3    Cholecalciferol (VITAMIN D PO) Take 2,000 Units by mouth daily.      CRESTOR 10 MG tablet Take 1 tablet (10 mg total) by mouth daily. 90 tablet 3   diclofenac Sodium (VOLTAREN) 1 % GEL Apply 4 g topically 4 (four) times daily as needed (joint pain). 350 g 2   Elderberry 575 MG/5ML SYRP Take 5 mLs by mouth daily.     ezetimibe (ZETIA) 10 MG tablet TAKE 1 TABLET BY MOUTH DAILY 100 tablet 2   fluticasone (FLONASE) 50 MCG/ACT nasal spray USE 1 SPRAY IN EACH NOSTRIL EVERY DAY 48 g 1   Glucose Blood (BLOOD GLUCOSE TEST STRIPS 333) STRP 100 each by In Vitro route 3 (three) times daily before meals. 300 strip 3   losartan-hydrochlorothiazide (HYZAAR) 100-25 MG tablet TAKE 1 TABLET BY MOUTH DAILY (Patient taking differently: Take 0.5 tablets by mouth daily.) 100 tablet 2   metFORMIN (GLUCOPHAGE-XR) 500 MG 24 hr tablet TAKE 1 TABLET BY MOUTH TWICE  DAILY 200 tablet 2   methocarbamol (ROBAXIN) 500 MG tablet Take 1 tablet (500 mg total) by mouth 2 (two) times daily. 180 tablet 0   metoprolol (TOPROL-XL) 200 MG 24 hr tablet TAKE 1 TABLET(200 MG) BY MOUTH DAILY 90 tablet 1   Multiple Vitamins-Minerals (COMPLETE ENERGY) TABS Take 1 tablet by mouth daily.     nitroGLYCERIN (NITROSTAT) 0.4 MG SL tablet Place 1 tablet (0.4 mg total) under the tongue every 5 (five) minutes as needed for chest pain. 30 tablet 4   Omega-3 Fatty Acids (FISH OIL) 1000 MG CAPS Take 1 capsule by mouth daily.     omeprazole (PRILOSEC) 20 MG capsule TAKE 1 CAPSULE BY MOUTH DAILY 100 capsule 2   traMADol (ULTRAM) 50 MG tablet Take 50 mg by mouth 2 (two) times daily as needed for pain.     triamcinolone ointment (KENALOG) 0.1 % Apply 1 application topically 2 (two) times daily. 80 g 0   Turmeric (QC TUMERIC COMPLEX PO) Take 1,000 mg by mouth daily. Every other day     No  facility-administered medications prior to visit.    Allergies  Allergen Reactions   Dairy Aid [Tilactase]     Hives, diarrhea   Lactose Anaphylaxis and Other (See  Comments)   Atorvastatin Other (See Comments)    Severe myalgias   Hydrocodone-Acetaminophen     REACTION: sore throat with vicodin   Hydrocodone-Acetaminophen Other (See Comments)    sore throat with vicodin    Milk-Related Compounds Other (See Comments)   Mirabegron Other (See Comments)    Headache    Oxycontin [Oxycodone Hcl]     "makes me crazy"   Prochlorperazine Edisylate     REACTION: stroke-like symptoms with companzine   Rosuvastatin     Can take brand crestor-severe myalgias   Shellfish Allergy    Umeclidinium-Vilanterol Other (See Comments)    Lactose allergy   Percodan [Oxycodone-Aspirin] Palpitations    Fast heartbeat with percodan    Review of Systems  Constitutional:  Negative for chills, diaphoresis, fatigue and fever.  HENT:  Negative for congestion, ear pain and sinus pain.   Respiratory:  Positive for cough. Negative for shortness of breath and wheezing.   Cardiovascular:  Negative for chest pain.  Gastrointestinal:  Negative for abdominal pain, constipation, nausea and vomiting.  Genitourinary:  Negative for dysuria.  Musculoskeletal:  Negative for arthralgias.  Neurological:  Negative for weakness and headaches.  Psychiatric/Behavioral:  Negative for dysphoric mood. The patient is not nervous/anxious.        Objective:        09/15/2023    3:55 PM 09/01/2023    2:51 PM 08/25/2023    3:24 PM  Vitals with BMI  Height 4' 11.5" 4' 11.5" 4' 11.5"  Weight 144 lbs 6 oz 141 lbs 142 lbs 13 oz  BMI 28.69 28.01 28.37  Systolic 130 122 409  Diastolic 68 72 78  Pulse 95 61 83    No data found.   Physical Exam Vitals reviewed.  Constitutional:      General: She is not in acute distress.    Appearance: Normal appearance. She is not ill-appearing.  Eyes:     Conjunctiva/sclera: Conjunctivae normal.  Cardiovascular:     Rate and Rhythm: Normal rate and regular rhythm.     Heart sounds: Normal heart sounds. No murmur heard. Pulmonary:     Effort:  Pulmonary effort is normal.     Breath sounds: Normal breath sounds. No wheezing.  Musculoskeletal:        General: Normal range of motion.  Skin:    General: Skin is warm.  Neurological:     Mental Status: She is alert. Mental status is at baseline.  Psychiatric:        Mood and Affect: Mood normal.        Behavior: Behavior normal.     Health Maintenance Due  Topic Date Due   Zoster Vaccines- Shingrix (2 of 2) 03/07/2021   Colonoscopy  06/12/2022   COVID-19 Vaccine (5 - 2024-25 season) 03/01/2023    There are no preventive care reminders to display for this patient.   Lab Results  Component Value Date   TSH 1.370 11/19/2021   Lab Results  Component Value Date   WBC 5.5 09/01/2023   HGB 12.9 09/01/2023   HCT 39.3 09/01/2023   MCV 93 09/01/2023   PLT 292 09/01/2023   Lab Results  Component Value Date   NA 141 09/01/2023   K 3.9 09/01/2023   CO2 23 09/01/2023   GLUCOSE 85 09/01/2023  BUN 16 09/01/2023   CREATININE 0.92 09/01/2023   BILITOT 0.3 09/01/2023   ALKPHOS 51 09/01/2023   AST 24 09/01/2023   ALT 28 09/01/2023   PROT 6.9 09/01/2023   ALBUMIN 4.3 09/01/2023   CALCIUM 9.5 09/01/2023   EGFR 64 09/01/2023   Lab Results  Component Value Date   CHOL 122 09/01/2023   Lab Results  Component Value Date   HDL 44 09/01/2023   Lab Results  Component Value Date   LDLCALC 51 09/01/2023   Lab Results  Component Value Date   TRIG 159 (H) 09/01/2023   Lab Results  Component Value Date   CHOLHDL 2.8 09/01/2023   Lab Results  Component Value Date   HGBA1C 7.6 (H) 09/01/2023       Assessment & Plan:  Cough, persistent Assessment & Plan: Persistent dry cough post-influenza, likely due to throat irritation. No signs of pneumonia. CPAP use does not alleviate cough. OTC remedies ineffective. - Advise non-menthol lozenges or hard candies to coat throat. - Encourage warm tea with lemon and ginger. - Advise reducing coffee intake, increase tea. -  Order chest X-ray if symptoms worsen or shortness of breath develops.  Orders: -     DG Chest 2 View; Future  History of influenza -     DG Chest 2 View; Future     No orders of the defined types were placed in this encounter.   Orders Placed This Encounter  Procedures   DG Chest 2 View     Follow-up: Return if symptoms worsen or fail to improve.  An After Visit Summary was printed and given to the patient.  Lajuana Matte, FNP [facility] [phone]

## 2023-09-15 NOTE — Assessment & Plan Note (Signed)
 Persistent dry cough post-influenza, likely due to throat irritation. No signs of pneumonia. CPAP use does not alleviate cough. OTC remedies ineffective. - Advise non-menthol lozenges or hard candies to coat throat. - Encourage warm tea with lemon and ginger. - Advise reducing coffee intake, increase tea. - Order chest X-ray if symptoms worsen or shortness of breath develops.

## 2023-09-16 ENCOUNTER — Telehealth: Payer: Self-pay

## 2023-09-16 ENCOUNTER — Other Ambulatory Visit (HOSPITAL_BASED_OUTPATIENT_CLINIC_OR_DEPARTMENT_OTHER): Payer: Self-pay

## 2023-09-16 ENCOUNTER — Ambulatory Visit (INDEPENDENT_AMBULATORY_CARE_PROVIDER_SITE_OTHER)
Admission: RE | Admit: 2023-09-16 | Discharge: 2023-09-16 | Disposition: A | Discharge status: Home / Self Care | Source: Ambulatory Visit | Attending: Family Medicine | Admitting: Family Medicine

## 2023-09-16 ENCOUNTER — Other Ambulatory Visit: Payer: Self-pay

## 2023-09-16 DIAGNOSIS — R0602 Shortness of breath: Secondary | ICD-10-CM

## 2023-09-16 DIAGNOSIS — E782 Mixed hyperlipidemia: Secondary | ICD-10-CM

## 2023-09-16 DIAGNOSIS — Z8709 Personal history of other diseases of the respiratory system: Secondary | ICD-10-CM

## 2023-09-16 DIAGNOSIS — R053 Chronic cough: Secondary | ICD-10-CM

## 2023-09-16 DIAGNOSIS — R059 Cough, unspecified: Secondary | ICD-10-CM

## 2023-09-16 DIAGNOSIS — E1159 Type 2 diabetes mellitus with other circulatory complications: Secondary | ICD-10-CM

## 2023-09-16 MED ORDER — CRESTOR 20 MG PO TABS: 20.0000 mg | ORAL_TABLET | Freq: Every day | ORAL | 3 refills | Status: DC

## 2023-09-16 MED ORDER — VALSARTAN-HYDROCHLOROTHIAZIDE 80-12.5 MG PO TABS: 1.0000 | ORAL_TABLET | Freq: Every day | ORAL | 3 refills | Status: DC

## 2023-09-16 NOTE — Progress Notes (Signed)
   09/16/2023  Patient ID: Mindy Ingram, female   DOB: Jul 30, 1945, 78 y.o.   MRN: [id number]  Patient out reach to follow up after recent telephone visit.  Inform patient that PCP is in agreement with changing losartan/HCTZ 100/25 mg daily to valsartan/hydrochlorothiazide 80/12.5 mg daily based on current blood pressure readings and patient preference.  I also made Mindy Ingram aware that PCP agrees with increasing Crestor to 20 mg and stopping ezetimibe 10 mg to decrease pill burden.  She has also been approved for health well grant to assist with Crestor co-pay (processing information under patient active FYI's).  Prescriptions reflecting these changes are pending for PCP to sign.  Patient was waiting in the lobby for another provider appointment when I called, so we were unable to discuss if she has started Ozempic sample or not.  Will follow up with patient in another week or 2 to check on this as well as tolerance/efficacy of medication changes.  Mindy Ingram, PharmD, DPLA   I attest that I have reviewed this visit and agree with the plan established.   Mindy Ingram, [facility] [phone]

## 2023-09-16 NOTE — Progress Notes (Signed)
 ERROR

## 2023-09-21 ENCOUNTER — Telehealth: Payer: Self-pay

## 2023-09-21 DIAGNOSIS — H26491 Other secondary cataract, right eye: Secondary | ICD-10-CM | POA: Diagnosis not present

## 2023-09-21 DIAGNOSIS — H43813 Vitreous degeneration, bilateral: Secondary | ICD-10-CM | POA: Diagnosis not present

## 2023-09-21 NOTE — Progress Notes (Signed)
   09/21/2023  Patient ID: Mindy Ingram, female   DOB: 24-Oct-1945, 78 y.o.   MRN: [id number]  Missed call/voicemail from patient stating Crestor copay still >$300.  Contacted [facility] to provide [employer] processing information that was included on recently sent prescription.  Medication is actually not going through on patient's Medicare plan, so they cannot bill the [facility] without it first being covered by Medicare D.  Submitted PA request to patient's Medicare plan for Cresto 20mg  along with supporting documentation via CoverMyMeds.    Prior authorization for Crestor 20mg  has been approved by patient's Medicare plan.  Contacted [facility] to have them reprocess fill, and it is now going through for $0.  Attempted to contact patient to make her aware, but I had to leave a voicemail.  [name], PharmD, DPLA

## 2023-09-22 ENCOUNTER — Other Ambulatory Visit: Payer: Self-pay

## 2023-09-28 ENCOUNTER — Telehealth: Payer: Self-pay

## 2023-09-28 NOTE — Telephone Encounter (Signed)
 Copied from CRM [id number]. Topic: Clinical - Lab/Test Results >> Sep 24, 2023  4:13 PM Fuller Mandril wrote: Reason for CRM: Patient called has not received xray results. Showing not final in system. Would like someone to check on status and give her a call. Thank you.

## 2023-09-28 NOTE — Telephone Encounter (Signed)
 Spoke with Liz Beach from [facility] they will get this x-ray read STAT

## 2023-10-19 ENCOUNTER — Other Ambulatory Visit: Payer: Self-pay

## 2023-10-19 NOTE — Progress Notes (Signed)
 10/19/2023 Name: Mindy Ingram MRN: [id number] DOB: 09-05-1945  Chief Complaint  Patient presents with   Medication Management   Diabetes    Mindy Ingram is a 78 y.o. year old female who presented for a telephone visit.   They were referred to the pharmacist by their Case Management Team  for assistance in managing diabetes, hyperlipidemia, and medication access.    Subjective:  Care Team: Primary Care Provider: Odilia Bennett, [place] ; Next Scheduled Visit:  Future Appointments  Date Time Provider Department Center  11/02/2023 11:00 AM Rolando Cliche, [facility] None  03/03/2024  1:40 PM Odilia Bennett, PA COX-CFO None     Medication Access/Adherence  Current Pharmacy:  [facility] [phone] [facility], [place] - [address] [place] [address] Phone: [phone] Fax: [phone]  [facility] Delivery - [place], [place] - [address] [place] [address] Phone: [phone] Fax: [phone]   Patient reports affordability concerns with their medications:  not any at this time; med assistance through healthwell on HLD through 08/09/2014 Patient reports access/transportation concerns to their pharmacy:  not at this time; drives short distances; occasionally will go to [facility] Patient reports adherence concerns with their medications:  No  tries her absolute best to stay on top of health conditions and medications, actively taking notes through our visit.    Diabetes:  Current medications:  Medications tried in the past:   Current glucose readings: at start of visit, primary concern was a BG of 371, which occurred sometime on 10/17/2023. With further discussion it was understood that most of the reported readings fell near 140-150, with no other readings above 200. Notes that she as been snacking throughout the day, so PPD readings likely higher   Using BG meter; testing two times daily once  fasting, other is a less defined PPD reading which occurs after her late breakfast and sometime before dinner.  Patient denies hypoglycemic s/sx including dizziness, shakiness, sweating. Patient denies hyperglycemic symptoms including polyuria, polydipsia, polyphagia, nocturia, neuropathy, blurred vision.  Current meal patterns:  - Breakfast: eggs, sausage or bacon, 1 slice of toast - Lunch n/a - Supper: - Snacks: loves popcorn - Drinks no sweetened drinks per patient  Current physical activity: walks throughout house, would walk near home but is not comfortable with all of the neighbor's dogs outside.  Current medication access support: healthwell for HLD; I did review ins coverage on [employer]  should that be an option the next time we talk; as of 10/19/2023/after deductible, copay would be $47 monthly   Objective:  Lab Results  Component Value Date   HGBA1C 7.6 (H) 09/01/2023    Lab Results  Component Value Date   CREATININE 0.92 09/01/2023   BUN 16 09/01/2023   NA 141 09/01/2023   K 3.9 09/01/2023   CL 101 09/01/2023   CO2 23 09/01/2023    Lab Results  Component Value Date   CHOL 122 09/01/2023   HDL 44 09/01/2023   LDLCALC 51 09/01/2023   TRIG 159 (H) 09/01/2023   CHOLHDL 2.8 09/01/2023    Medications Reviewed Today     Reviewed by Rolando Cliche, [facility] (Pharmacist) on 10/19/23 at 1448  Med List Status: <None>   Medication Order Taking? Sig Documenting Provider Last Dose Status Informant  amLODipine  (NORVASC ) 5 MG tablet [id number] No TAKE ONE-HALF TABLET BY MOUTH  DAILY Cox, Kirsten, MD Taking Active   aspirin EC  81 MG tablet [id number] No Take 81 mg by mouth daily. [provider] Taking Active   azelastine  (ASTELIN ) 0.1 % nasal spray [id number] No Place 1 spray into both nostrils 2 (two) times daily as needed for allergies or rhinitis. [provider] Taking Active   B-D ULTRA-FINE 33 LANCETS MISC [id number] No 1 each by Does not apply route in the  morning and at bedtime. Odilia Bennett, PA Taking Active   Blood Glucose Monitoring Suppl (ONE TOUCH ULTRA 2) w/Device KIT [id number] No Use as instructed five times daily. E11.69  Patient taking differently: 1 each as directed. Use as instructed four  times daily. E11.69   CoxBurleigh Carp, MD Taking Active   cetirizine  (ZYRTEC ) 10 MG tablet [id number] No Take 1 tablet (10 mg total) by mouth daily as needed for allergies. Amalia Badder, FNP Taking Active   Cholecalciferol (VITAMIN D PO) [id number] No Take 2,000 Units by mouth daily.  [provider] Taking Active   CRESTOR  20 MG tablet [id number]  Take 1 tablet (20 mg total) by mouth daily. [employer] [place] billed secondary to Medicare to cover copay: ID [id number] BIN [id number] PCN [id number] Group [id number] Odilia Bennett, PA  Active   diclofenac  Sodium (VOLTAREN ) 1 % GEL [id number] No Apply 4 g topically 4 (four) times daily as needed (joint pain). Mercy Stall, MD Taking Active   Elderberry 575 MG/5ML SYRP [id number] No Take 5 mLs by mouth daily. [provider] Taking Active   fluticasone  (FLONASE ) 50 MCG/ACT nasal spray [id number] No USE 1 SPRAY IN EACH NOSTRIL EVERY DAY Cox, Kirsten, MD Taking Active   Glucose Blood (BLOOD GLUCOSE TEST STRIPS 333) STRP [id number] No 100 each by In Vitro route 3 (three) times daily before meals. Cox, Kirsten, MD Taking Active   metFORMIN  (GLUCOPHAGE -XR) 500 MG 24 hr tablet [id number] No TAKE 1 TABLET BY MOUTH TWICE  DAILY Sirivol, Mamatha, MD Taking Active   methocarbamol  (ROBAXIN ) 500 MG tablet [id number] No Take 1 tablet (500 mg total) by mouth 2 (two) times daily. Cox, Kirsten, MD Taking Active   metoprolol  (TOPROL -XL) 200 MG 24 hr tablet [id number] No TAKE 1 TABLET(200 MG) BY MOUTH DAILY Cox, Kirsten, MD Taking Active   Multiple Vitamins-Minerals (COMPLETE ENERGY) TABS [phone] No Take 1 tablet by mouth daily. [provider] Taking Active Self  nitroGLYCERIN  (NITROSTAT ) 0.4 MG SL tablet [id number] No Place 1  tablet (0.4 mg total) under the tongue every 5 (five) minutes as needed for chest pain. Revankar, Micael Adas, MD Taking Active   Omega-3 Fatty Acids (FISH OIL) 1000 MG CAPS [id number] No Take 1 capsule by mouth daily. [provider] Taking Active   omeprazole  (PRILOSEC) 20 MG capsule [id number] No TAKE 1 CAPSULE BY MOUTH DAILY Cox, Kirsten, MD Taking Active   traMADol  (ULTRAM ) 50 MG tablet [id number] No Take 50 mg by mouth 2 (two) times daily as needed for pain. [provider] Taking Active   triamcinolone  ointment (KENALOG ) 0.1 % [id number] No Apply 1 application topically 2 (two) times daily. Cox, Kirsten, MD Taking Active   Turmeric (QC TUMERIC COMPLEX PO) [id number] No Take 1,000 mg by mouth daily. Every other day [provider] Taking Active   valsartan -hydrochlorothiazide  (DIOVAN -HCT) 80-12.5 MG tablet [id number]  Take 1 tablet by mouth daily. Odilia Bennett, PA  Active   Med List Note Linder Revere, [place] D, MD 03/09/11 1408): CPAP 12, reduced to 10 as of 03/06/2011              Assessment/Plan:  Diabetes: - Currently controlled - Reviewed long term cardiovascular and renal outcomes of uncontrolled blood sugar - Reviewed goal A1c, goal fasting, and goal 2 hour post prandial glucose - Reviewed dietary modifications including    - Reviewed lifestyle modifications including: low carb diet, exercise as safely tolerated - reviewed possible next steps in Rx therapy if needed, could bump up to total metformin  XR to 1500mg /day in separate doses; alternatively could consider mounjaro  given $47/month copay after deductible   - Patient denies personal or family history of multiple endocrine neoplasia type 2, medullary thyroid  cancer; personal history of pancreatitis or gallbladder disease. - Recommend to check glucose BID  Follow Up Plan: 2 wk telephone outreach  Rolando Cliche, PharmD, BCGP Clinical Pharmacist  [phone]

## 2023-11-01 ENCOUNTER — Other Ambulatory Visit: Payer: Self-pay | Admitting: Family Medicine

## 2023-11-02 ENCOUNTER — Other Ambulatory Visit: Payer: Self-pay

## 2023-11-03 NOTE — Progress Notes (Signed)
   11/03/2023 Name: Mindy Ingram MRN: [id number] DOB: [birth year]  Chief Complaint  Patient presents with   Diabetes    Mindy Ingram is a 78 y.o. year old female who presented for a telephone visit.   They were referred to the pharmacist by their PCP for assistance in managing diabetes.    Subjective:  Care Team: Primary Care Provider: Odilia Bennett, [place] ; Next Scheduled Visit:  Future Appointments  Date Time Provider Department Center  11/30/2023 11:00 AM Rolando Cliche, [facility] CHL-POPH None  03/03/2024  1:40 PM Odilia Bennett, PA [facility] None     Medication Access/Adherence  Current Pharmacy:  [facility] [phone] [facility], [place] - [address] [place] [address] Phone: [phone] Fax: [phone]  [facility] Delivery - [place], [place] - [address] [place] [address] Phone: [phone] Fax: [phone]   Patient reports affordability concerns with their medications: No  Patient reports access/transportation concerns to their pharmacy: No  Patient reports adherence concerns with their medications:  No     Diabetes:  Current medications:  Medications tried in the past:   Current glucose readings:  --Fasting: 131, 132, 141, 128, 145, 105, 106, 141 --PPD after dinner: 195, 173, 144, 173, 143, 179, 141, 149 Using glucose meter; testing two times daily  Patient denies hypoglycemic s/sx including dizziness, shakiness, sweating. Patient denies hyperglycemic symptoms including polyuria, polydipsia, polyphagia, nocturia, neuropathy, blurred vision.  Current meal patterns:  - Biggest issue is late night snacking, will have some popcorn, skinny dip.  - Ok with having almonds, peanuts, etc., as snack   Current physical activity: limited  Current medication access support: none   Objective:  Lab Results  Component Value Date   HGBA1C 7.6 (H) 09/01/2023    Lab Results  Component  Value Date   CREATININE 0.92 09/01/2023   BUN 16 09/01/2023   NA 141 09/01/2023   K 3.9 09/01/2023   CL 101 09/01/2023   CO2 23 09/01/2023    Lab Results  Component Value Date   CHOL 122 09/01/2023   HDL 44 09/01/2023   LDLCALC 51 09/01/2023   TRIG 159 (H) 09/01/2023   CHOLHDL 2.8 09/01/2023    Medications Reviewed Today   Medications were not reviewed in this encounter       Assessment/Plan:   Diabetes: - Currently uncontrolled - Reviewed long term cardiovascular and renal outcomes of uncontrolled blood sugar - Reviewed goal A1c, goal fasting, and goal 2 hour post prandial glucose - Reviewed dietary modifications including: in attempt to to limit late night snacking, we agreed to change from 815pm to 7pm for cut off.  - Patient denies personal or family history of multiple endocrine neoplasia type 2, medullary thyroid  cancer; personal history of pancreatitis or gallbladder disease. - Recommend to check glucose twice daily as is fasting and ppd dinner - discuss current and previous readings, now has good understanding of how to test properly and that snacks are going to impact any PPD readings. . Maintains on metformin  500 mg BID, would be ok with increase if needed in the near future.     Follow Up Plan: 4wk f/u telephone call.   Rolando Cliche, PharmD, BCGP Clinical Pharmacist  [phone]

## 2023-11-16 ENCOUNTER — Telehealth: Payer: Self-pay

## 2023-11-16 NOTE — Telephone Encounter (Signed)
 Copied from CRM [id number]. Topic: Clinical - Prescription Issue >> Nov 13, 2023  2:56 PM Mindy Ingram wrote: Reason for CRM: Pt is at t[facility] store and needs a new referral for CPAP supplies. Pt stated the fax number is [phone]. IF you have questions call the pt back at [phone].  Please schedule pt ov. Hasn't been seen within a year, needs ov to re-new order for cpap supplies.

## 2023-11-17 NOTE — Telephone Encounter (Signed)
 Attempted to call patient. Left voicemail for patient to give our office a call to get scheduled for an appointment for cpap supplies.

## 2023-11-18 NOTE — Telephone Encounter (Signed)
 Appt made. NFN

## 2023-11-18 NOTE — Progress Notes (Signed)
 Subjective:    Patient ID: Mindy Ingram, female    DOB: 08-Apr-1946, 78 y.o.   MRN: [id number]  HPI F never smoker followed for obstructive sleep apnea, allergic rhinitis complicated by DM, bronchitis, GERD. Office Spirometry 04/03/2015- WNL FVC 2.14/110%, FEV1 1.94/128%, FEV1/FVC 0.91 . NPSG 11/12/10- AHI  17.9/ hr, desat to 76% titrated to CPAP 12, body weight 165 lbs Office spirometry- 11/04/16--by allergy  office- FEV1/FVC 0.77 WNL -------------------------------------------------------------------------------.   05/27/22-  78 year old female never smoker followed for OSA, complicated by allergic rhinitis, asthmatic bronchitis, DM2, GERD,  CPAP auto 5-17/ ([facility]/ [facility])   Machine replaced 11/2019 Download compliance 100%, AHI 2.8/ hr -Proventil  hfa, flonase ,  Body weight today-150 lbs Covid vax-4 Moderna Flu vax-had Download reviewed.  She would like reevaluation to see if she still needs CPAP-discussed. Breathing is comfortable and she feels well.  She has not had any apparent asthma or need for inhaler in a very long time. Pending shoulder surgery and requests clearance.  No obvious reason identified to prevent or delay this procedure.  She is given clearance to proceed.    11/19/23- Virtual Visit via Video Note  I connected with Mindy Ingram on 11/19/23 at  9:30 AM EDT by a video enabled telemedicine application and verified that I am speaking with the correct person using two identifiers.  Location: Patient: home Provider: office   I discussed the limitations of evaluation and management by telemedicine and the availability of in person appointments. The patient expressed understanding and agreed to proceed.  History of Present Illness: 78 year old female never smoker followed for OSA, complicated by allergic rhinitis, asthmatic bronchitis, DM2, GERD,  CPAP auto 5-17/ ([facility]/ [facility])   Machine replaced 11/2019 Download compliance  97%, AHI 1.8/hr -Proventil  hfa, flonase ,  Discussed the use of AI scribe software for clinical note transcription with the patient, who gave verbal consent to proceed.  History of Present Illness   Mindy Ingram is a 78 year old female with sleep apnea who presents for CPAP management and supply renewal.  She uses a CPAP machine replaced in 2021, set to maintain pressure between five and seventeen centimeters of water pressure, predominantly around sixteen. This setting effectively controls her sleep apnea, with less than two breakthrough apneas per hour.  She experiences discomfort from her current CPAP mask, which causes indentations on her skin, particularly around the ears. She is interested in exploring newer mask options to alleviate this issue. Despite having a buildup of CPAP supplies, she maintains them in a sanitary condition and prefers to continue using her existing supplies.     She has not had wheezing or need for asthma inhalers in years.  Observations/ Objective  CXR 09/16/23 IMPRESSION: No active cardiopulmonary disease  Assessment and Plan:    Obstructive Sleep Apnea Obstructive sleep apnea well-managed with CPAP. Current settings effective, maintaining less than 2 apneas per hour. Machine functions well, no adjustments needed. - Renew CPAP supplies. - Advise exploring different CPAP mask options with home care company. - Instruct to inform home care company not to send supplies unless requested. - Advise to inquire about eligibility for CPAP machine replacement in one year.   Asthma- mild intermittent uncomplicated Hasn't needed inhalers in a long time. Doing well.        Follow Up Instructions:    I discussed the assessment and treatment plan with the patient. The patient was provided an opportunity to ask questions and all were answered. The patient agreed with  the plan and demonstrated an understanding of the instructions.   The patient was advised  to call back or seek an in-person evaluation if the symptoms worsen or if the condition fails to improve as anticipated.  I provided 20 minutes of non-face-to-face time during this encounter.   Mindy College, MD  78 year old female never smoker followed for OSA, complicated by allergic rhinitis, asthmatic bronchitis, DM2, GERD,  CPAP auto 5-17/ ([facility]/ [facility])   Machine replaced 11/2019 Download compliance 100%, AHI 2.8/ hr -Proventil  hfa, flonase ,  Body weight today-  CXR 09/16/23 IMPRESSION: No active cardiopulmonary disease.  Review of Systems- see HPI + = positive Constitutional:   No-   weight loss, night sweats, fevers, chills, fatigue, lassitude. HEENT:   No-  headaches, difficulty swallowing, tooth/dental problems, sore throat,       No-  sneezing, itching, ear ache, +nasal congestion,+post nasal drip,  CV:  No-   chest pain, orthopnea, PND, swelling in lower extremities, anasarca, dizziness, palpitations Resp: No-   shortness of breath with exertion or at rest.              No-   productive cough,  No non-productive cough,  No-  coughing up of blood.              No-   change in color of mucus.  No- wheezing.   Skin:  GI:  No-   heartburn, indigestion, abdominal pain, nausea, vomiting,  GU:  MS:  +  joint pain or swelling.   Neuro- nothing unusual  Psych:  No- change in mood or affect. No depression or anxiety.  No memory loss.    Objective:   Physical Exam    Mask Covid precaution General- Alert, Oriented, Affect-appropriate, Distress- none acute, + overweight Skin- +Raised nodules right nasolabial fold without erythema or excoriation Lymphadenopathy- none Head- atraumatic            Eyes- Gross vision intact, PERRLA, conjunctivae clear secretions            Ears-hearing normal            Nose- +turbinate edema, No-Septal dev, mucus, No-polyps, erosion, perforation             Throat- Mallampati II , mucosa clear , drainage- none, tonsils-  atrophic Neck- flexible , trachea midline, no stridor , thyroid  nl, carotid no bruit Chest - symmetrical excursion , unlabored           Heart/CV- RRR , no murmur , no gallop  , no rub, nl s1 s2                           - JVD- none , edema- none, stasis changes- none, varices- none           Lung- clear to P&A, wheeze-none, unlabored, cough-none, dullness-none, rub- none           Chest wall-  Abd- Br/ Gen/ Rectal- Not done, not indicated Extrem- cyanosis- none, clubbing, none, atrophy- none, strength- nl. Cane. Neuro- grossly intact to observation Assessment & Plan:

## 2023-11-19 ENCOUNTER — Telehealth (INDEPENDENT_AMBULATORY_CARE_PROVIDER_SITE_OTHER): Admitting: Internal Medicine

## 2023-11-19 ENCOUNTER — Encounter: Payer: Self-pay | Admitting: Internal Medicine

## 2023-11-19 DIAGNOSIS — G4733 Obstructive sleep apnea (adult) (pediatric): Secondary | ICD-10-CM

## 2023-11-19 NOTE — Patient Instructions (Signed)
 Order- DME [facility]- Continue auto 5-17. Please renew mask of choice, heated humidifier, supplies, and continue AirView/ card

## 2023-11-26 DIAGNOSIS — H52223 Regular astigmatism, bilateral: Secondary | ICD-10-CM | POA: Diagnosis not present

## 2023-11-26 DIAGNOSIS — M7711 Lateral epicondylitis, right elbow: Secondary | ICD-10-CM | POA: Diagnosis not present

## 2023-11-26 DIAGNOSIS — H18413 Arcus senilis, bilateral: Secondary | ICD-10-CM | POA: Diagnosis not present

## 2023-11-26 DIAGNOSIS — M5136 Other intervertebral disc degeneration, lumbar region with discogenic back pain only: Secondary | ICD-10-CM | POA: Diagnosis not present

## 2023-11-26 DIAGNOSIS — M797 Fibromyalgia: Secondary | ICD-10-CM | POA: Diagnosis not present

## 2023-11-26 DIAGNOSIS — R768 Other specified abnormal immunological findings in serum: Secondary | ICD-10-CM | POA: Diagnosis not present

## 2023-11-26 DIAGNOSIS — M1991 Primary osteoarthritis, unspecified site: Secondary | ICD-10-CM | POA: Diagnosis not present

## 2023-11-26 DIAGNOSIS — E119 Type 2 diabetes mellitus without complications: Secondary | ICD-10-CM | POA: Diagnosis not present

## 2023-11-26 DIAGNOSIS — H26492 Other secondary cataract, left eye: Secondary | ICD-10-CM | POA: Diagnosis not present

## 2023-11-26 DIAGNOSIS — Z961 Presence of intraocular lens: Secondary | ICD-10-CM | POA: Diagnosis not present

## 2023-11-27 ENCOUNTER — Telehealth: Payer: Self-pay

## 2023-11-27 NOTE — Progress Notes (Signed)
   11/27/2023  Patient ID: Mindy Ingram, female   DOB: Aug 27, 1945, 78 y.o.   MRN: [id number]  Noted missed calls from [facility] this morning. Attempted to call back and left message for.   Rolando Cliche, PharmD, BCGP Clinical Pharmacist  [phone]

## 2023-11-30 ENCOUNTER — Other Ambulatory Visit: Payer: Self-pay

## 2023-11-30 ENCOUNTER — Other Ambulatory Visit: Payer: Self-pay | Admitting: Family Medicine

## 2023-11-30 NOTE — Progress Notes (Signed)
 11/30/2023 Name: Mindy Ingram MRN: [id number] DOB: 1946-06-03  Chief Complaint  Patient presents with   Medication Management   Diabetes    Mindy Ingram is a 78 y.o. year old female who presented for a telephone visit.   They were referred to the pharmacist by their PCP for assistance in managing diabetes.    Subjective:  Care Team: Primary Care Provider: Odilia Bennett, [place] ; Next Scheduled Visit:  Future Appointments  Date Time Provider Department Center  12/28/2023 11:00 AM Rolando Cliche, [facility]L-POPH None  03/03/2024  1:40 PM Odilia Bennett, PA [facility]O None     Medication Access/Adherence  Current Pharmacy:  [facility] [phone] [facility] - [place], [place] - [address] [place] [address] Phone: [phone] Fax: [phone]  [facility] Delivery - [place], [place] - [address] [place] [address] Phone: [phone] Fax: [phone]   Patient reports affordability concerns with their medications: No  Patient reports access/transportation concerns to their pharmacy: No  Patient reports adherence concerns with their medications:  No     Diabetes:  Current medications:  Medications tried in the past:   Current glucose readings:  --Fasting: 131, 132, 141, 128, 145, 105, 106, 141 --PPD after dinner: 195, 173, 144, 173, 143, 179, 141, 149 Using glucose meter; testing two times daily  Patient denies hypoglycemic s/sx including dizziness, shakiness, sweating. Patient denies hyperglycemic symptoms including polyuria, polydipsia, polyphagia, nocturia, neuropathy, blurred vision.  Current meal patterns:  - Biggest issue is late night snacking, will have some popcorn, skinny dip.  - Ok with having almonds, peanuts, etc., as snack   Current physical activity: limited  Current medication access support: none   Objective:  Lab Results  Component Value Date   HGBA1C 7.6 (H) 09/01/2023     Lab Results  Component Value Date   CREATININE 0.92 09/01/2023   BUN 16 09/01/2023   NA 141 09/01/2023   K 3.9 09/01/2023   CL 101 09/01/2023   CO2 23 09/01/2023    Lab Results  Component Value Date   CHOL 122 09/01/2023   HDL 44 09/01/2023   LDLCALC 51 09/01/2023   TRIG 159 (H) 09/01/2023   CHOLHDL 2.8 09/01/2023    Medications Reviewed Today   Medications were not reviewed in this encounter       Assessment/Plan:   Diabetes: - Currently uncontrolled - Reviewed long term cardiovascular and renal outcomes of uncontrolled blood sugar - Reviewed goal A1c, goal fasting, and goal 2 hour post prandial glucose - Reviewed dietary modifications including: in attempt to to limit late night snacking, we agreed to change from 815pm to 7pm for cut off.  - Patient denies personal or family history of multiple endocrine neoplasia type 2, medullary thyroid  cancer; personal history of pancreatitis or gallbladder disease. - Recommend to check glucose twice daily as is fasting and ppd dinner - discuss current and previous readings, now has good understanding of how to test properly and that snacks are going to impact any PPD readings. . Maintains on metformin  500 mg BID, would be ok with increase if needed in the near future.     Follow Up Plan: 4wk f/u telephone call.   11/30/2023 update: -FBGs ranging 120s-160s; PPD 130s-190d, one 242.  - no formal exercise currently, does plan to start incorporating exercise 2-3 times per week. Is not sedentary, always on her feet or engaged in community/social activity.  -  doing well with current medications, including metformin  500mg  Bid, would be open to bump up to 1500mg /day if needed - is going to work on swapping out yogurt and nuts for her night time snack.   Rolando Cliche, PharmD, BCGP Clinical Pharmacist  [phone]

## 2023-12-02 ENCOUNTER — Ambulatory Visit: Admitting: Family Medicine

## 2023-12-02 ENCOUNTER — Encounter: Payer: Self-pay | Admitting: Family Medicine

## 2023-12-02 VITALS — BP 140/62 | HR 100 | Temp 97.9°F | Resp 16 | Ht 59.5 in | Wt 144.2 lb

## 2023-12-02 DIAGNOSIS — E782 Mixed hyperlipidemia: Secondary | ICD-10-CM | POA: Diagnosis not present

## 2023-12-02 DIAGNOSIS — H6123 Impacted cerumen, bilateral: Secondary | ICD-10-CM | POA: Diagnosis not present

## 2023-12-02 DIAGNOSIS — E1169 Type 2 diabetes mellitus with other specified complication: Secondary | ICD-10-CM | POA: Diagnosis not present

## 2023-12-02 NOTE — Progress Notes (Signed)
 Acute Office Visit  Subjective:    Patient ID: Mindy Ingram, female    DOB: 01-01-1946, 78 y.o.   MRN: [id number]  Chief Complaint  Patient presents with   Ear Fullness   Discussed the use of AI scribe software for clinical note transcription with the patient, who gave verbal consent to proceed.  History of Present Illness   Mindy Ingram is a 78 year old female who presents with hearing difficulties due to earwax buildup.  She has experienced a decline in hearing over the past few months, with difficulty hearing the television, necessitating an increase in volume from 17 to 40. She describes her ears as 'stocking up closer' and notes that previous attempts to remove earwax were only partially successful, with only a small amount removed during her last visit three months ago.  No symptoms of ear infection, such as sore ears, fever, or sinus issues. She recalls experiencing vertigo approximately one and a half to two years ago but does not report any recent episodes.  She inquires about her last A1c level, which was 7.6 on 09/01/23, and mentions discussing her numbers with a provider named Mirian Ames.      Patient is in today for ear cleaning. Patient states she can't hear good now.  Past Medical History:  Diagnosis Date   Acute URI 02/09/2023   Allergic asthma, mild intermittent, uncomplicated 10/17/2007   Office Spirometry 04/03/2015- WNL FVC 2.14/110%, FEV1 1.94/128%, FEV1/FVC 0.91  Office spirometry- 11/04/16--by allergy  office- FEV1/FVC 0.77 WNL   Bilateral impacted cerumen 07/01/2018   BMI 29.0-29.9,adult 06/10/2022   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   Cervical radicular pain 01/20/2022   Chronic back pain    Contusion of head, initial encounter 03/18/2021   Corn of toe 06/25/2020   Degeneration of lumbosacral intervertebral disc 01/26/2012   DJD (degenerative joint disease)    DM type 2 with diabetic mixed hyperlipidemia (HCC) 04/08/2021   Dyslipidemia  01/23/2015   Essential hypertension 01/23/2015   Fibromyalgia    GERD 10/17/2007   Qualifier: Diagnosis of  By: Linder Revere MD, Clinton D    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   Hyperlipidemia 06/25/2020   Hypertension associated with diabetes (HCC) 01/23/2015   Lumbar facet arthropathy 01/20/2022   Lumbar spondylolysis 02/10/2012   Menopause 06/25/2020   Metatarsalgia of both feet 09/18/2016   Multiple benign melanocytic nevi 06/25/2020   Myalgia 03/03/2022   Myalgia due to statin 03/03/2022   Neck pain 12/12/2021   Obesity 07/10/2021   OSA (obstructive sleep apnea)    cpap setting of 10   Pain 03/24/2018   Preop cardiovascular exam 06/10/2022   Right rotator cuff tear 06/25/2020   Right temporal frontal scalp contusions 03/18/2021   S/P lumbar fusion 03/16/2012   Seasonal and perennial allergic rhinitis 10/17/2007   Allergy  vaccine 2002- dc'd   Shingles    Shoulder joint pain 06/25/2020   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   Statin myopathy 06/10/2022   Thoracic spine pain 02/23/2013   Type II or unspecified type diabetes mellitus without mention of complication, not stated as uncontrolled     Past Surgical History:  Procedure Laterality Date   APPENDECTOMY  1995   BACK SURGERY  2013   lower back with plates and screws   benign lump right axilla     BILATERAL SALPINGOOPHORECTOMY  1995   CERVICAL SPINE SURGERY  1992   at [facility], slight limitation with turning neck  CHOLECYSTECTOMY  1995   EUS N/A 07/21/2013   Procedure: UPPER ENDOSCOPIC ULTRASOUND (EUS) LINEAR;  Surgeon: Janel Medford, MD;  Location: [facility];  Service: Endoscopy;  Laterality: N/A;   FOOT SURGERY     2007 - right great toe, 2000 - right fifth digit .   NEUROPLASTY / TRANSPOSITION MEDIAN NERVE AT CARPAL TUNNEL BILATERAL  1995   PARTIAL COLECTOMY  1995   benign adhesions   PARTIAL HYSTERECTOMY  1980s.   abdominal   right foot surgery  2000   right great toe  with artificial bone inserted   right knee arthroscopy Right 02/2017    Family History  Problem Relation Age of Onset   Heart disease Mother    Prostate cancer Father    Heart attack Sister    Breast cancer Maternal Aunt    Prostate cancer Paternal Uncle    Allergies Neg Hx    Asthma Neg Hx    Eczema Neg Hx    Immunodeficiency Neg Hx     Social History   Socioeconomic History   Marital status: Widowed    Spouse name: Not on file   Number of children: 1   Years of education: Not on file   Highest education level: Not on file  Occupational History   Occupation: Retired  Tobacco Use   Smoking status: Never   Smokeless tobacco: Never  Vaping Use   Vaping status: Never Used  Substance and Sexual Activity   Alcohol use: No   Drug use: No   Sexual activity: Not on file  Other Topics Concern   Not on file  Social History Narrative   Not on file   Social Drivers of Health   Financial Resource Strain: Low Risk  (08/06/2023)   Overall Financial Resource Strain (CARDIA)    Difficulty of Paying Living Expenses: Not very hard  Food Insecurity: No Food Insecurity (08/06/2023)   Hunger Vital Sign    Worried About Running Out of Food in the Last Year: Never true    Ran Out of Food in the Last Year: Never true  Transportation Needs: No Transportation Needs (08/06/2023)   PRAPARE - Administrator, Civil Service (Medical): No    Lack of Transportation (Non-Medical): No  Physical Activity: Insufficiently Active (08/06/2023)   Exercise Vital Sign    Days of Exercise per Week: 2 days    Minutes of Exercise per Session: 40 min  Stress: No Stress Concern Present (08/06/2023)   [employer] of Occupational Health - Occupational Stress Questionnaire    Feeling of Stress : Not at all  Social Connections: Moderately Isolated (08/06/2023)   Social Connection and Isolation Panel [NHANES]    Frequency of Communication with Friends and Family: More than three times a week     Frequency of Social Gatherings with Friends and Family: More than three times a week    Attends Religious Services: More than 4 times per year    Active Member of [employer] or Organizations: No    Attends Banker Meetings: Never    Marital Status: Widowed  Intimate Partner Violence: Not At Risk (08/06/2023)   Humiliation, Afraid, Rape, and Kick questionnaire    Fear of Current or Ex-Partner: No    Emotionally Abused: No    Physically Abused: No    Sexually Abused: No    Outpatient Medications Prior to Visit  Medication Sig Dispense Refill   amLODipine  (NORVASC ) 5 MG tablet TAKE ONE-HALF TABLET BY MOUTH  DAILY (Patient taking differently: Take 2.5 mg by mouth daily. As needed) 50 tablet 2   aspirin EC 81 MG tablet Take 81 mg by mouth daily.     azelastine  (ASTELIN ) 0.1 % nasal spray Place 1 spray into both nostrils 2 (two) times daily as needed for allergies or rhinitis.     B-D ULTRA-FINE 33 LANCETS MISC 1 each by Does not apply route in the morning and at bedtime. 180 each 1   Blood Glucose Monitoring Suppl (ONE TOUCH ULTRA 2) w/Device KIT Use as instructed five times daily. E11.69 (Patient taking differently: 1 each as directed. Use as instructed four  times daily. E11.69) 1 kit 0   cetirizine  (ZYRTEC ) 10 MG tablet Take 1 tablet (10 mg total) by mouth daily as needed for allergies. (Patient taking differently: Take 10 mg by mouth daily. As needed) 90 tablet 3   Chlorophyll 10 MG TABS Take 1 tablet by mouth daily.     Cholecalciferol (VITAMIN D PO) Take 2,000 Units by mouth daily.      CRESTOR  20 MG tablet Take 1 tablet (20 mg total) by mouth daily. [facility] billed secondary to Medicare to cover copay: ID [id number] BIN [id number] PCN [id number] Group [id number] 90 tablet 3   diclofenac  Sodium (VOLTAREN ) 1 % GEL Apply 4 g topically 4 (four) times daily as needed (joint pain). 350 g 2   Elderberry 575 MG/5ML SYRP Take 5 mLs by mouth daily.     fluticasone  (FLONASE ) 50 MCG/ACT nasal  spray USE 1 SPRAY IN EACH NOSTRIL EVERY DAY 48 g 1   Glucose Blood (BLOOD GLUCOSE TEST STRIPS 333) STRP 100 each by In Vitro route 3 (three) times daily before meals. 300 strip 3   metFORMIN  (GLUCOPHAGE -XR) 500 MG 24 hr tablet TAKE 1 TABLET BY MOUTH TWICE  DAILY 200 tablet 2   methocarbamol  (ROBAXIN ) 500 MG tablet Take 1 tablet (500 mg total) by mouth 2 (two) times daily. (Patient taking differently: Take 500 mg by mouth 2 (two) times daily. As needed) 180 tablet 0   metoprolol  (TOPROL -XL) 200 MG 24 hr tablet TAKE 1 TABLET(200 MG) BY MOUTH DAILY 90 tablet 1   Multiple Vitamins-Minerals (COMPLETE ENERGY) TABS Take 1 tablet by mouth daily.     nitroGLYCERIN  (NITROSTAT ) 0.4 MG SL tablet Place 1 tablet (0.4 mg total) under the tongue every 5 (five) minutes as needed for chest pain. 30 tablet 4   Omega-3 Fatty Acids (FISH OIL) 1000 MG CAPS Take 1 capsule by mouth daily.     omeprazole  (PRILOSEC) 20 MG capsule TAKE 1 CAPSULE BY MOUTH DAILY 100 capsule 2   prednisoLONE acetate (PRED FORTE) 1 % ophthalmic suspension Place 1 drop into the left eye 4 (four) times daily.     traMADol  (ULTRAM ) 50 MG tablet Take 50 mg by mouth 2 (two) times daily as needed for pain.     triamcinolone  ointment (KENALOG ) 0.1 % Apply 1 application topically 2 (two) times daily. (Patient taking differently: Apply 1 application  topically 2 (two) times daily. As needed) 80 g 0   Turmeric (QC TUMERIC COMPLEX PO) Take 1,000 mg by mouth daily. Every other day     valsartan -hydrochlorothiazide  (DIOVAN -HCT) 80-12.5 MG tablet Take 1 tablet by mouth daily. 90 tablet 3   No facility-administered medications prior to visit.    Allergies  Allergen Reactions   Dairy Aid [Tilactase]     Hives, diarrhea   Lactose Anaphylaxis and Other (See Comments)   Atorvastatin Other (See Comments)  Severe myalgias   Hydrocodone-Acetaminophen     REACTION: sore throat with vicodin   Hydrocodone-Acetaminophen Other (See Comments)    sore throat  with vicodin    Milk-Related Compounds Other (See Comments)   Mirabegron Other (See Comments)    Headache    Oxycontin [Oxycodone Hcl]     "makes me crazy"   Prochlorperazine Edisylate     REACTION: stroke-like symptoms with companzine   Rosuvastatin      Can take brand crestor -severe myalgias   Shellfish Allergy     Umeclidinium-Vilanterol Other (See Comments)    Lactose allergy    Percodan [Oxycodone-Aspirin] Palpitations    Fast heartbeat with percodan    Review of Systems  Constitutional:  Negative for chills, diaphoresis, fatigue and fever.  HENT:  Positive for hearing loss. Negative for congestion, ear pain, rhinorrhea, sinus pressure, sinus pain, sneezing, sore throat and tinnitus.   Eyes: Negative.   Respiratory:  Negative for cough and shortness of breath.   Cardiovascular:  Negative for chest pain.  Gastrointestinal:  Negative for abdominal pain, constipation, diarrhea, nausea and vomiting.  Endocrine: Negative.   Genitourinary:  Negative for dysuria.  Musculoskeletal:  Negative for arthralgias.  Skin: Negative.   Allergic/Immunologic: Negative.   Neurological:  Negative for weakness and headaches.  Hematological: Negative.   Psychiatric/Behavioral:  Negative for dysphoric mood. The patient is not nervous/anxious.        Objective:         12/02/2023    2:51 PM 12/02/2023    1:42 PM 09/15/2023    3:55 PM  Vitals with BMI  Height  4' 11.5" 4' 11.5"  Weight  144 lbs 3 oz 144 lbs 6 oz  BMI  28.65 28.69  Systolic 140 144 161  Diastolic 62 68 68  Pulse  100 95    No data found.   Physical Exam Constitutional:      General: She is not in acute distress.    Appearance: Normal appearance.  HENT:     Right Ear: Decreased hearing noted. There is impacted cerumen.     Left Ear: Decreased hearing noted. There is impacted cerumen.  Neck:     Vascular: No carotid bruit.  Cardiovascular:     Rate and Rhythm: Normal rate and regular rhythm.     Heart sounds:  Normal heart sounds.  Pulmonary:     Effort: Pulmonary effort is normal.     Breath sounds: Normal breath sounds.  Abdominal:     General: Bowel sounds are normal.     Palpations: Abdomen is soft.     Tenderness: There is no abdominal tenderness.  Neurological:     Mental Status: She is alert. Mental status is at baseline.  Psychiatric:        Mood and Affect: Mood normal.        Behavior: Behavior normal.     Health Maintenance Due  Topic Date Due   Zoster Vaccines- Shingrix (2 of 2) 03/07/2021   Colonoscopy  06/12/2022   COVID-19 Vaccine (5 - 2024-25 season) 03/01/2023    There are no preventive care reminders to display for this patient.   Lab Results  Component Value Date   TSH 1.370 11/19/2021   Lab Results  Component Value Date   WBC 5.5 09/01/2023   HGB 12.9 09/01/2023   HCT 39.3 09/01/2023   MCV 93 09/01/2023   PLT 292 09/01/2023   Lab Results  Component Value Date   NA 141 09/01/2023   K 3.9  09/01/2023   CO2 23 09/01/2023   GLUCOSE 85 09/01/2023   BUN 16 09/01/2023   CREATININE 0.92 09/01/2023   BILITOT 0.3 09/01/2023   ALKPHOS 51 09/01/2023   AST 24 09/01/2023   ALT 28 09/01/2023   PROT 6.9 09/01/2023   ALBUMIN 4.3 09/01/2023   CALCIUM  9.5 09/01/2023   EGFR 64 09/01/2023   Lab Results  Component Value Date   CHOL 122 09/01/2023   Lab Results  Component Value Date   HDL 44 09/01/2023   Lab Results  Component Value Date   LDLCALC 51 09/01/2023   Lab Results  Component Value Date   TRIG 159 (H) 09/01/2023   Lab Results  Component Value Date   CHOLHDL 2.8 09/01/2023   Lab Results  Component Value Date   HGBA1C 7.6 (H) 09/01/2023       Assessment & Plan:  Bilateral impacted cerumen Assessment & Plan: Cerumen occlusion in both ears, left more affected. Unable to hear the TV, volume increased to 40 No infection or vertigo. - Administer hydrogen peroxide in the left ear to soften cerumen. - Irrigation and wax removal tolerated  well with no complications.    DM type 2 with diabetic mixed hyperlipidemia (HCC) Assessment & Plan: Not at goal HbA1c at 7.6%, above target.  - Schedule follow-up for next week to draw labs - Continue taking metformin  500 mg po BID - Ensure attendance at diabetes management seminar.          Follow-up: Return in about 5 days (around 12/07/2023) for chronic, fasting, 40 min.  An After Visit Summary was printed and given to the patient.  Delford Felling, FNP [facility] [phone]

## 2023-12-02 NOTE — Assessment & Plan Note (Addendum)
 Not at goal HbA1c at 7.6%, above target.  - Schedule follow-up for next week to draw labs - Continue taking metformin  500 mg po BID - Ensure attendance at diabetes management seminar.

## 2023-12-02 NOTE — Assessment & Plan Note (Signed)
 Cerumen occlusion in both ears, left more affected. Unable to hear the TV, volume increased to 40 No infection or vertigo. - Administer hydrogen peroxide in the left ear to soften cerumen. - Irrigation and wax removal tolerated well with no complications.

## 2023-12-14 ENCOUNTER — Other Ambulatory Visit: Payer: Self-pay | Admitting: Family Medicine

## 2023-12-15 ENCOUNTER — Other Ambulatory Visit: Payer: Self-pay

## 2023-12-15 ENCOUNTER — Telehealth: Payer: Self-pay | Admitting: Physician Assistant

## 2023-12-15 MED ORDER — VALACYCLOVIR HCL 1 G PO TABS: 1000.0000 mg | ORAL_TABLET | Freq: Every day | ORAL | 0 refills | Status: DC | PRN

## 2023-12-15 NOTE — Telephone Encounter (Signed)
 Attempted to call pt to see why she is needing the Acyclovir  since it is not on her medication list.   Left a voicemail to call back to [facility] at [phone] and let us  know why you have requested the Acyclovir .    Refill request routed to [facility].

## 2023-12-15 NOTE — Telephone Encounter (Signed)
 Rx pended for Mindy Ingram to be sent. Medication is not active on me

## 2023-12-15 NOTE — Telephone Encounter (Unsigned)
 Copied from CRM [id number]. Topic: Clinical - Medication Refill >> Dec 15, 2023  9:21 AM Carlatta H wrote: Medication: Acyclovir   Has the patient contacted their pharmacy? Yes (Agent: If no, request that the patient contact the pharmacy for the refill. If patient does not wish to contact the pharmacy document the reason why and proceed with request.) (Agent: If yes, when and what did the pharmacy advise?)  This is the patient's preferred pharmacy:    [facility] - [place], [place] - [address] [place] [address] Phone: [phone] Fax: [phone]  Is this the correct pharmacy for this prescription? Yes If no, delete pharmacy and type the correct one.   Has the prescription been filled recently? No  Is the patient out of the medication? Yes  Has the patient been seen for an appointment in the last year OR does the patient have an upcoming appointment? Yes  Can we respond through MyChart? No  Agent: Please be advised that Rx refills may take up to 3 business days. We ask that you follow-up with your pharmacy.

## 2023-12-17 ENCOUNTER — Other Ambulatory Visit: Payer: Self-pay

## 2023-12-17 ENCOUNTER — Telehealth: Payer: Self-pay

## 2023-12-17 ENCOUNTER — Other Ambulatory Visit: Payer: Self-pay | Admitting: Family Medicine

## 2023-12-17 MED ORDER — VALACYCLOVIR HCL 1 G PO TABS: 1000.0000 mg | ORAL_TABLET | Freq: Two times a day (BID) | ORAL | 0 refills | Status: DC | PRN

## 2023-12-17 NOTE — Telephone Encounter (Signed)
 Copied from CRM [id number]. Topic: Clinical - Prescription Issue >> Dec 17, 2023 12:12 PM DeAngela L wrote: Reason for CRM: patient called and says her prescription was sent to the [facility] pharmacy and this prescription should have went to the [facility] home delivery pharm and the patient states this is free to refill with [facility] and cost a lot out of pocket with [facility], and she is out of medication at this time. Patient say she takes this medication twice a day as needed and now she have none left and she is wondering how she can get this corrected today The patient states this prescription she has been on this medication for many years   [facility] - [place], [place] - [address] [place] [address] Phone: [phone] Fax: [phone]  Pt num [phone] (H) cellphone  [phone]

## 2023-12-17 NOTE — Telephone Encounter (Signed)
 Rx sent to [facility] per patients request.

## 2023-12-22 ENCOUNTER — Other Ambulatory Visit: Payer: Self-pay | Admitting: Family Medicine

## 2023-12-24 ENCOUNTER — Ambulatory Visit (INDEPENDENT_AMBULATORY_CARE_PROVIDER_SITE_OTHER): Admitting: Physician Assistant

## 2023-12-24 ENCOUNTER — Encounter: Payer: Self-pay | Admitting: Physician Assistant

## 2023-12-24 VITALS — BP 110/70 | HR 77 | Temp 97.1°F | Resp 16 | Ht 59.5 in | Wt 137.5 lb

## 2023-12-24 DIAGNOSIS — I10 Essential (primary) hypertension: Secondary | ICD-10-CM

## 2023-12-24 DIAGNOSIS — E1169 Type 2 diabetes mellitus with other specified complication: Secondary | ICD-10-CM | POA: Diagnosis not present

## 2023-12-24 DIAGNOSIS — Z1212 Encounter for screening for malignant neoplasm of rectum: Secondary | ICD-10-CM | POA: Diagnosis not present

## 2023-12-24 DIAGNOSIS — E782 Mixed hyperlipidemia: Secondary | ICD-10-CM | POA: Diagnosis not present

## 2023-12-24 DIAGNOSIS — M7741 Metatarsalgia, right foot: Secondary | ICD-10-CM

## 2023-12-24 DIAGNOSIS — M7742 Metatarsalgia, left foot: Secondary | ICD-10-CM | POA: Diagnosis not present

## 2023-12-24 DIAGNOSIS — Z1211 Encounter for screening for malignant neoplasm of colon: Secondary | ICD-10-CM | POA: Diagnosis not present

## 2023-12-24 HISTORY — DX: Encounter for screening for malignant neoplasm of rectum: Z12.11

## 2023-12-24 MED ORDER — NITROGLYCERIN 0.4 MG SL SUBL: 0.4000 mg | SUBLINGUAL_TABLET | SUBLINGUAL | 1 refills | Status: AC | PRN

## 2023-12-24 NOTE — Assessment & Plan Note (Signed)
 Elevated blood glucose levels, recent readings up to 221 mg/dL. Recent weight loss, dietary control challenges. Currently on metformin , considering dosage increase cautiously. - Check A1c level to assess glycemic control. - Discuss potential medication adjustments with pharmacist Jacob based on A1c results. - Consider alternative dosing strategies for metformin  if needed.

## 2023-12-24 NOTE — Patient Instructions (Signed)
 VISIT SUMMARY:  Today, you came in for a follow-up visit to discuss your chronic conditions, including diabetes, hypertension, and foot pain. We also talked about your recent health concerns and planned some necessary evaluations and adjustments to your treatment plan.  YOUR PLAN:  -FOOT PAIN: You have chronic foot pain, likely due to previous surgeries and pressure issues. We will refer you to Dr. Alphonza, a podiatrist, for further evaluation and management to explore both surgical and non-surgical options.  -DIABETES MELLITUS: Your blood sugar levels have been fluctuating, and we need to check your A1c level to better understand your diabetes control. Depending on the results, we may adjust your metformin  dosage. Please discuss any medication changes with your pharmacist, Lang.  -HYPERTENSION: Your blood pressure is well-controlled with your current medications. We will reduce your metoprolol  dosage to half and monitor your blood pressure to avoid any side effects or rebound hypertension.  -COLON CANCER SCREENING: You are overdue for a colonoscopy, which is a screening test for colon cancer. We will refer you to Dr. Charlanne or Dr. Vicci for this procedure.  -GENERAL HEALTH MAINTENANCE: We will perform routine blood tests to monitor your kidney and liver function, electrolytes, blood count, and thyroid  function to ensure your overall health is maintained.  INSTRUCTIONS:  Please schedule an appointment with Dr. Alphonza for your foot pain and with Dr. Charlanne or Dr. Vicci for your colonoscopy. Follow up with your pharmacist, Lang, to discuss any potential changes to your diabetes medication after we get your A1c results. Continue to monitor your blood pressure at home and report any significant changes. Await further instructions based on your test results.

## 2023-12-24 NOTE — Progress Notes (Signed)
 Subjective:  Patient ID: Mindy Ingram, female    DOB: [birth year]  Age: 78 y.o. MRN: [id number]  Chief Complaint  Patient presents with   Medical Management of Chronic Issues    HPI:   Diabetes:  Not well controlled Glucose checking: regularly Glucose logs:Blood sugar runs between 120-240 Hypoglycemia: no  Most recent A1C: 7.6 Current medications: Metformin  XR 500 mg Twice a day. Last Eye Exam:Up to date  Foot checks: daily  Hyperlipidemia: Current medications:Rosuvastatin  5 mg, Zetia  10 mg daily, Fish Oil 1000 mg daily. Intolerant to statins. Patient states she needs Brand name Crestor  instead of the Generic because she states that the Generics give her bad Muscle aches and pains.  GERD: Taking Omeprazole  20 mg daily.  Hypertension: Current medications: Aspirin 81 mg daily, Metoprolol  200 mg every day, valsartan -hydrochlorothiazide  80-12.5 mg daily.  OSA: Sees Dr. Neysa. She has continued on CPAP.   Discussed the use of AI scribe software for clinical note transcription with the patient, who gave verbal consent to proceed.  History of Present Illness   Mindy Ingram is a 78 year old female with diabetes and hypertension who presents for a chronic follow-up visit.  She experiences significant pain in her toes, which she manages by applying cream originally intended for her knee. The pain is severe enough to cause difficulty in identifying which toe is affected. She last saw a podiatrist 5-6 years ago, who suggested surgical intervention to relieve pressure. She has had multiple foot surgeries in the past, including bone removal from her toes.  She is concerned about her blood sugar levels, which have been fluctuating between 135 and 221 over the past month. Despite losing 4-5 pounds, she is worried about her diabetes management, especially with dietary temptations from family and church events. She manages her diabetes with a combination of medications, including  metoprolol  and Diovan , and has discussed medication adjustments with her pharmacist.  She experiences dizziness when lying down, which she attributes to vertigo. She has a history of high blood pressure and cholesterol, but no recent heart-related diagnoses. She has not used nitroglycerin  in three years and questions the necessity of continuing it. She manages her blood pressure with a combination of medications and has recently returned to her original Diovan  dosage.  She reports occasional episodes of watery discharge when she believes she is passing gas, occurring about twice a month. She has a family history of similar issues, as her cousin experiences loss of bowel control. No complete loss of bowel function or control.          08/06/2023   10:51 AM 02/03/2023   11:26 AM 05/26/2022    1:02 PM 02/28/2022    9:20 AM 08/06/2021   10:12 AM  Depression screen PHQ 2/9  Decreased Interest 0 0 0 0 0  Down, Depressed, Hopeless 1 0 0 0 0  PHQ - 2 Score 1 0 0 0 0  Altered sleeping 0 0     Tired, decreased energy 0 0     Change in appetite 0 0     Feeling bad or failure about yourself  0 0     Trouble concentrating 0 0     Moving slowly or fidgety/restless 0 0     Suicidal thoughts 0 0     PHQ-9 Score 1 0     Difficult doing work/chores Not difficult at all Somewhat difficult           11/19/2023  9:30 AM  Fall Risk   Falls in the past year? 0    Patient Care Team: Milon Cleaves, [place] as PCP - General (Physician Assistant) Towana Charleston, MD (Gastroenterology) Mai Lynwood FALCON, MD as Consulting Physician (Rheumatology) Myrick Elspeth PARAS., DPM (Podiatry) Lavonia Lye, MD as Consulting Physician (Ophthalmology) Laurice Grice, OD (Optometry) Neysa Reggy BIRCH, MD as Consulting Physician (Pulmonary Disease)   Review of Systems  Constitutional:  Negative for chills, fatigue and fever.  HENT:  Negative for congestion, ear pain and sore throat.   Respiratory:  Negative for cough and  shortness of breath.   Cardiovascular:  Negative for chest pain and palpitations.  Gastrointestinal:  Negative for abdominal pain, constipation, diarrhea, nausea and vomiting.  Endocrine: Negative for polydipsia, polyphagia and polyuria.  Genitourinary:  Negative for difficulty urinating and dysuria.  Musculoskeletal:  Negative for arthralgias, back pain and myalgias.  Skin:  Negative for rash.  Neurological:  Negative for headaches.  Psychiatric/Behavioral:  Negative for dysphoric mood. The patient is not nervous/anxious.     Current Outpatient Medications on File Prior to Visit  Medication Sig Dispense Refill   aspirin EC 81 MG tablet Take 81 mg by mouth daily.     azelastine  (ASTELIN ) 0.1 % nasal spray Place 1 spray into both nostrils 2 (two) times daily as needed for allergies or rhinitis.     B-D ULTRA-FINE 33 LANCETS MISC 1 each by Does not apply route in the morning and at bedtime. 180 each 1   Blood Glucose Monitoring Suppl (ONE TOUCH ULTRA 2) w/Device KIT Use as instructed five times daily. E11.69 (Patient taking differently: 1 each by Does not apply route 2 (two) times daily. Use as instructed five times daily. E11.69) 1 kit 0   cetirizine  (ZYRTEC ) 10 MG tablet Take 1 tablet (10 mg total) by mouth daily as needed for allergies. 90 tablet 3   Chlorophyll 10 MG TABS Take 1 tablet by mouth daily.     Cholecalciferol (VITAMIN D PO) Take 2,000 Units by mouth daily.      CRESTOR  20 MG tablet Take 1 tablet (20 mg total) by mouth daily. [facility] billed secondary to Medicare to cover copay: ID [id number] BIN [id number] PCN [id number] Group [id number] 90 tablet 3   diclofenac  Sodium (VOLTAREN ) 1 % GEL Apply 4 g topically 4 (four) times daily as needed (joint pain). 350 g 2   fluticasone  (FLONASE ) 50 MCG/ACT nasal spray USE 1 SPRAY IN EACH NOSTRIL EVERY DAY 48 g 1   Glucose Blood (BLOOD GLUCOSE TEST STRIPS 333) STRP 100 each by In Vitro route 3 (three) times daily before meals. 300 strip 3    metFORMIN  (GLUCOPHAGE -XR) 500 MG 24 hr tablet TAKE 1 TABLET BY MOUTH TWICE  DAILY 200 tablet 2   methocarbamol  (ROBAXIN ) 500 MG tablet Take 1 tablet (500 mg total) by mouth 2 (two) times daily. (Patient taking differently: Take 500 mg by mouth 2 (two) times daily. As needed) 180 tablet 0   metoprolol  (TOPROL -XL) 200 MG 24 hr tablet TAKE 1 TABLET(200 MG) BY MOUTH DAILY 90 tablet 1   Multiple Vitamins-Minerals (COMPLETE ENERGY) TABS Take 1 tablet by mouth daily.     Omega-3 Fatty Acids (FISH OIL) 1000 MG CAPS Take 1 capsule by mouth daily.     omeprazole  (PRILOSEC) 20 MG capsule TAKE 1 CAPSULE BY MOUTH DAILY 100 capsule 2   valACYclovir  (VALTREX ) 1000 MG tablet Take 1 tablet (1,000 mg total) by mouth 2 (two) times daily as needed. 100  tablet 0   Elderberry 575 MG/5ML SYRP Take 5 mLs by mouth daily. (Patient not taking: Reported on 12/24/2023)     prednisoLONE acetate (PRED FORTE) 1 % ophthalmic suspension Place 1 drop into the left eye 4 (four) times daily.     traMADol  (ULTRAM ) 50 MG tablet Take 50 mg by mouth 2 (two) times daily as needed for pain.     triamcinolone  ointment (KENALOG ) 0.1 % Apply 1 application topically 2 (two) times daily. (Patient taking differently: Apply 1 application  topically 2 (two) times daily. As needed) 80 g 0   Turmeric (QC TUMERIC COMPLEX PO) Take 1,000 mg by mouth daily. Every other day     valsartan -hydrochlorothiazide  (DIOVAN -HCT) 80-12.5 MG tablet Take 1 tablet by mouth daily. 90 tablet 3   No current facility-administered medications on file prior to visit.   Past Medical History:  Diagnosis Date   Acute URI 02/09/2023   Allergic asthma, mild intermittent, uncomplicated 10/17/2007   Office Spirometry 04/03/2015- WNL FVC 2.14/110%, FEV1 1.94/128%, FEV1/FVC 0.91  Office spirometry- 11/04/16--by allergy  office- FEV1/FVC 0.77 WNL   Bilateral impacted cerumen 07/01/2018   BMI 29.0-29.9,adult 06/10/2022   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   Cervical  radicular pain 01/20/2022   Chronic back pain    Contusion of head, initial encounter 03/18/2021   Corn of toe 06/25/2020   Degeneration of lumbosacral intervertebral disc 01/26/2012   DJD (degenerative joint disease)    DM type 2 with diabetic mixed hyperlipidemia (HCC) 04/08/2021   Dyslipidemia 01/23/2015   Essential hypertension 01/23/2015   Fibromyalgia    GERD 10/17/2007   Qualifier: Diagnosis of  By: Neysa MD, Clinton D    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   Hyperlipidemia 06/25/2020   Hypertension associated with diabetes (HCC) 01/23/2015   Lumbar facet arthropathy 01/20/2022   Lumbar spondylolysis 02/10/2012   Menopause 06/25/2020   Metatarsalgia of both feet 09/18/2016   Multiple benign melanocytic nevi 06/25/2020   Myalgia 03/03/2022   Myalgia due to statin 03/03/2022   Neck pain 12/12/2021   Obesity 07/10/2021   OSA (obstructive sleep apnea)    cpap setting of 10   Pain 03/24/2018   Preop cardiovascular exam 06/10/2022   Right rotator cuff tear 06/25/2020   Right temporal frontal scalp contusions 03/18/2021   S/P lumbar fusion 03/16/2012   Seasonal and perennial allergic rhinitis 10/17/2007   Allergy  vaccine 2002- dc'd   Shingles    Shoulder joint pain 06/25/2020   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   Statin myopathy 06/10/2022   Thoracic spine pain 02/23/2013   Type II or unspecified type diabetes mellitus without mention of complication, not stated as uncontrolled    Past Surgical History:  Procedure Laterality Date   APPENDECTOMY  1995   BACK SURGERY  2013   lower back with plates and screws   benign lump right axilla     BILATERAL SALPINGOOPHORECTOMY  1995   CERVICAL SPINE SURGERY  1992   at [facility], slight limitation with turning neck   CHOLECYSTECTOMY  1995   EUS N/A 07/21/2013   Procedure: UPPER ENDOSCOPIC ULTRASOUND (EUS) LINEAR;  Surgeon: Toribio SHAUNNA Cedar, MD;  Location: [facility];  Service: Endoscopy;   Laterality: N/A;   FOOT SURGERY     2007 - right great toe, 2000 - right fifth digit .   NEUROPLASTY / TRANSPOSITION MEDIAN NERVE AT CARPAL TUNNEL BILATERAL  1995   PARTIAL COLECTOMY  1995   benign adhesions  PARTIAL HYSTERECTOMY  1980s.   abdominal   right foot surgery  2000   right great toe with artificial bone inserted   right knee arthroscopy Right 02/2017    Family History  Problem Relation Age of Onset   Heart disease Mother    Prostate cancer Father    Heart attack Sister    Breast cancer Maternal Aunt    Prostate cancer Paternal Uncle    Allergies Neg Hx    Asthma Neg Hx    Eczema Neg Hx    Immunodeficiency Neg Hx    Social History   Socioeconomic History   Marital status: Widowed    Spouse name: Not on file   Number of children: 1   Years of education: Not on file   Highest education level: Not on file  Occupational History   Occupation: Retired  Tobacco Use   Smoking status: Never   Smokeless tobacco: Never  Vaping Use   Vaping status: Never Used  Substance and Sexual Activity   Alcohol use: No   Drug use: No   Sexual activity: Not on file  Other Topics Concern   Not on file  Social History Narrative   Not on file   Social Drivers of Health   Financial Resource Strain: Low Risk  (08/06/2023)   Overall Financial Resource Strain (CARDIA)    Difficulty of Paying Living Expenses: Not very hard  Food Insecurity: No Food Insecurity (08/06/2023)   Hunger Vital Sign    Worried About Running Out of Food in the Last Year: Never true    Ran Out of Food in the Last Year: Never true  Transportation Needs: No Transportation Needs (08/06/2023)   PRAPARE - Administrator, Civil Service (Medical): No    Lack of Transportation (Non-Medical): No  Physical Activity: Insufficiently Active (08/06/2023)   Exercise Vital Sign    Days of Exercise per Week: 2 days    Minutes of Exercise per Session: 40 min  Stress: No Stress Concern Present (08/06/2023)   [employer] of Occupational Health - Occupational Stress Questionnaire    Feeling of Stress : Not at all  Social Connections: Moderately Isolated (08/06/2023)   Social Connection and Isolation Panel    Frequency of Communication with Friends and Family: More than three times a week    Frequency of Social Gatherings with Friends and Family: More than three times a week    Attends Religious Services: More than 4 times per year    Active Member of [employer] or Organizations: No    Attends Banker Meetings: Never    Marital Status: Widowed    Objective:  BP 110/70   Pulse 77   Temp (!) 97.1 F (36.2 C)   Resp 16   Ht 4' 11.5 (1.511 m)   Wt 137 lb 8 oz (62.4 kg)   SpO2 97%   BMI 27.31 kg/m      12/24/2023   10:32 AM 12/02/2023    2:51 PM 12/02/2023    1:42 PM  BP/Weight  Systolic BP 110 140 144  Diastolic BP 70 62 68  Wt. (Lbs) 137.5  144.2  BMI 27.31 kg/m2  28.64 kg/m2    Physical Exam Vitals reviewed.  Constitutional:      Appearance: Normal appearance.   Cardiovascular:     Rate and Rhythm: Normal rate and regular rhythm.     Heart sounds: Normal heart sounds.  Pulmonary:     Effort: Pulmonary effort  is normal.     Breath sounds: Normal breath sounds.  Abdominal:     General: Bowel sounds are normal.     Palpations: Abdomen is soft.     Tenderness: There is no abdominal tenderness.   Musculoskeletal:     Right foot: Prominent metatarsal heads and tenderness present.     Comments: Hammer toe of the third toe on the right foot. Has began causing severe pain causing her to wear only sandals.    Neurological:     Mental Status: She is alert and oriented to person, place, and time.   Psychiatric:        Mood and Affect: Mood normal.        Behavior: Behavior normal.         Lab Results  Component Value Date   WBC 5.5 09/01/2023   HGB 12.9 09/01/2023   HCT 39.3 09/01/2023   PLT 292 09/01/2023   GLUCOSE 85 09/01/2023   CHOL 122 09/01/2023   TRIG 159  (H) 09/01/2023   HDL 44 09/01/2023   LDLCALC 51 09/01/2023   ALT 28 09/01/2023   AST 24 09/01/2023   NA 141 09/01/2023   K 3.9 09/01/2023   CL 101 09/01/2023   CREATININE 0.92 09/01/2023   BUN 16 09/01/2023   CO2 23 09/01/2023   TSH 1.370 11/19/2021   INR 0.9 06/10/2022   HGBA1C 7.6 (H) 09/01/2023   MICROALBUR 10 12/25/2020      Assessment & Plan:  Metatarsalgia of both feet Assessment & Plan: Chronic foot pain with previous surgeries. Significant pain and footwear difficulty. Last podiatrist visit over five years ago. Surgery versus conservative management considered. - Refer to Dr. Alphonza, podiatrist, for evaluation and management.  Orders: -     Ambulatory referral to Podiatry  DM type 2 with diabetic mixed hyperlipidemia (HCC) Assessment & Plan: Elevated blood glucose levels, recent readings up to 221 mg/dL. Recent weight loss, dietary control challenges. Currently on metformin , considering dosage increase cautiously. - Check A1c level to assess glycemic control. - Discuss potential medication adjustments with pharmacist Jacob based on A1c results. - Consider alternative dosing strategies for metformin  if needed.   Essential hypertension, benign Assessment & Plan: Blood pressure well-controlled with metoprolol  and Diovan . Plan to reduce metoprolol  dosage to avoid side effects and rebound hypertension. - Reduce metoprolol  dosage to half and monitor blood pressure response.  Orders: -     Nitroglycerin ; Place 1 tablet (0.4 mg total) under the tongue every 5 (five) minutes as needed for chest pain.  Dispense: 30 tablet; Refill: 1 -     Lipid panel -     CBC with Differential/Platelet -     Comprehensive metabolic panel with GFR -     Hemoglobin A1c -     TSH  Screening for colorectal cancer Assessment & Plan: Overdue for colonoscopy, last in 2013. History of minor finding and occasional bowel incontinence. - Refer to Dr. Charlanne or Dr. Vicci for  colonoscopy.  Orders: -     Ambulatory referral to Gastroenterology    General Health Maintenance Routine blood work planned to monitor overall health. - Perform blood tests for kidney and liver function, electrolytes, blood count, and thyroid  function.  Follow-up Awaiting specialist appointments and further evaluation based on test results.      Meds ordered this encounter  Medications   nitroGLYCERIN  (NITROSTAT ) 0.4 MG SL tablet    Sig: Place 1 tablet (0.4 mg total) under the tongue every 5 (five)  minutes as needed for chest pain.    Dispense:  30 tablet    Refill:  1    Orders Placed This Encounter  Procedures   Lipid panel   CBC with Differential/Platelet   Comprehensive metabolic panel with GFR   Hemoglobin A1c   TSH   Ambulatory referral to Podiatry   Ambulatory referral to Gastroenterology     Follow-up: Return in about 3 months (around 03/25/2024) for Chronic, Nola.   I,Marla I Leal-Borjas,acting as a scribe for [employer], PA.,have documented all relevant documentation on the behalf of Nola Angles, PA,as directed by  Nola Angles, PA while in the presence of Nola Angles, [place].   An After Visit Summary was printed and given to the patient.  Nola Angles, [place] [facility] [phone]

## 2023-12-24 NOTE — Assessment & Plan Note (Signed)
 Blood pressure well-controlled with metoprolol  and Diovan . Plan to reduce metoprolol  dosage to avoid side effects and rebound hypertension. - Reduce metoprolol  dosage to half and monitor blood pressure response.

## 2023-12-24 NOTE — Assessment & Plan Note (Signed)
 Overdue for colonoscopy, last in 2013. History of minor finding and occasional bowel incontinence. - Refer to Dr. Charlanne or Dr. Vicci for colonoscopy.

## 2023-12-24 NOTE — Assessment & Plan Note (Signed)
 Chronic foot pain with previous surgeries. Significant pain and footwear difficulty. Last podiatrist visit over five years ago. Surgery versus conservative management considered. - Refer to Dr. Alphonza, podiatrist, for evaluation and management.

## 2023-12-25 LAB — CBC WITH DIFFERENTIAL/PLATELET
Basophils Absolute: 0 10*3/uL (ref 0.0–0.2)
Basos: 1 %
EOS (ABSOLUTE): 0.1 10*3/uL (ref 0.0–0.4)
Eos: 2 %
Hematocrit: 41.9 % (ref 34.0–46.6)
Hemoglobin: 13.5 g/dL (ref 11.1–15.9)
Immature Grans (Abs): 0 10*3/uL (ref 0.0–0.1)
Immature Granulocytes: 0 %
Lymphocytes Absolute: 2.1 10*3/uL (ref 0.7–3.1)
Lymphs: 48 %
MCH: 31.3 pg (ref 26.6–33.0)
MCHC: 32.2 g/dL (ref 31.5–35.7)
MCV: 97 fL (ref 79–97)
Monocytes Absolute: 0.4 10*3/uL (ref 0.1–0.9)
Monocytes: 8 %
Neutrophils Absolute: 1.8 10*3/uL (ref 1.4–7.0)
Neutrophils: 41 %
Platelets: 285 10*3/uL (ref 150–450)
RBC: 4.32 x10E6/uL (ref 3.77–5.28)
RDW: 12.4 % (ref 11.7–15.4)
WBC: 4.3 10*3/uL (ref 3.4–10.8)

## 2023-12-25 LAB — LIPID PANEL
Chol/HDL Ratio: 2.1 ratio (ref 0.0–4.4)
Cholesterol, Total: 113 mg/dL (ref 100–199)
HDL: 53 mg/dL (ref >39)
LDL Chol Calc (NIH): 39 mg/dL (ref 0–99)
Triglycerides: 121 mg/dL (ref 0–149)
VLDL Cholesterol Cal: 21 mg/dL (ref 5–40)

## 2023-12-25 LAB — COMPREHENSIVE METABOLIC PANEL WITH GFR
ALT: 11 IU/L (ref 0–32)
AST: 19 IU/L (ref 0–40)
Albumin: 4.7 g/dL (ref 3.8–4.8)
Alkaline Phosphatase: 45 IU/L (ref 44–121)
BUN/Creatinine Ratio: 22 (ref 12–28)
BUN: 20 mg/dL (ref 8–27)
Bilirubin Total: 0.4 mg/dL (ref 0.0–1.2)
CO2: 22 mmol/L (ref 20–29)
Calcium: 10.1 mg/dL (ref 8.7–10.3)
Chloride: 102 mmol/L (ref 96–106)
Creatinine, Ser: 0.91 mg/dL (ref 0.57–1.00)
Globulin, Total: 2.6 g/dL (ref 1.5–4.5)
Glucose: 119 mg/dL — ABNORMAL HIGH (ref 70–99)
Potassium: 4 mmol/L (ref 3.5–5.2)
Sodium: 142 mmol/L (ref 134–144)
Total Protein: 7.3 g/dL (ref 6.0–8.5)
eGFR: 65 mL/min/{1.73_m2} (ref >59)

## 2023-12-25 LAB — TSH: TSH: 0.693 u[IU]/mL (ref 0.450–4.500)

## 2023-12-25 LAB — HEMOGLOBIN A1C
Est. average glucose Bld gHb Est-mCnc: 163 mg/dL
Hgb A1c MFr Bld: 7.3 % — ABNORMAL HIGH (ref 4.8–5.6)

## 2023-12-27 ENCOUNTER — Ambulatory Visit: Payer: Self-pay | Admitting: Physician Assistant

## 2023-12-28 ENCOUNTER — Other Ambulatory Visit: Payer: Self-pay

## 2023-12-28 NOTE — Progress Notes (Signed)
 12/28/2023 Name: Mindy Ingram MRN: [id number] DOB: 1946-02-05  Chief Complaint  Patient presents with   Diabetes   Medication Management    Mindy Ingram is a 78 y.o. year old female who presented for a telephone visit.   They were referred to the pharmacist by their PCP for assistance in managing diabetes.    Subjective:  Care Team: Primary Care Provider: Milon Ingram, Mindy Ingram ; Next Scheduled Visit:  Future Appointments  Date Time Provider Department Center  01/25/2024 11:00 AM Mindy Ingram, [facility] None  03/29/2024 10:00 AM Mindy Cleaves, PA [facility] None     Medication Access/Adherence  Current Pharmacy:  [facility] [id number] - Mindy Ingram, [place] - [address] [place]  [address] Phone: [phone] Fax: [phone]  [facility] - [place], [place] - [address] [place] [address] Phone: [phone] Fax: [phone]   Patient reports affordability concerns with their medications: No  Patient reports access/transportation concerns to their pharmacy: No  Patient reports adherence concerns with their medications:  No     Diabetes:  Current medications:  Medications tried in the past:   Current glucose readings:  --Fasting: 131, 132, 141, 128, 145, 105, 106, 141 --PPD after dinner: 195, 173, 144, 173, 143, 179, 141, 149 Using glucose meter; testing two times daily  Patient denies hypoglycemic s/sx including dizziness, shakiness, sweating. Patient denies hyperglycemic symptoms including polyuria, polydipsia, polyphagia, nocturia, neuropathy, blurred vision.  Current meal patterns:  - Biggest issue is late night snacking, will have some popcorn, skinny dip.  - Ok with having almonds, peanuts, etc., as snack   Current physical activity: limited  Current medication access support: none   Objective:  Lab Results  Component Value Date   HGBA1C 7.3 (H) 12/24/2023     Lab Results  Component Value Date   CREATININE 0.91 12/24/2023   BUN 20 12/24/2023   NA 142 12/24/2023   K 4.0 12/24/2023   CL 102 12/24/2023   CO2 22 12/24/2023    Lab Results  Component Value Date   CHOL 113 12/24/2023   HDL 53 12/24/2023   LDLCALC 39 12/24/2023   TRIG 121 12/24/2023   CHOLHDL 2.1 12/24/2023    Medications Reviewed Today     Reviewed by Mindy Ingram, [facility] (Pharmacist) on 12/28/23 at 1129  Med List Status: <None>   Medication Order Taking? Sig Documenting Provider Last Dose Status Informant  aspirin EC 81 MG tablet [id number]  Take 81 mg by mouth daily. [provider]  Active   azelastine  (ASTELIN ) 0.1 % nasal spray [id number]  Place 1 spray into both nostrils 2 (two) times daily as needed for allergies or rhinitis. [provider]  Active   B-D ULTRA-FINE 33 LANCETS MISC [id number]  1 each by Does not apply route in the morning and at bedtime. Mindy Cleaves, PA  Active   Blood Glucose Monitoring Suppl (ONE TOUCH ULTRA 2) w/Device KIT [id number]  Use as instructed five times daily. E11.69  Patient taking differently: 1 each by Does not apply route 2 (two) times daily. Use as instructed five times daily. E11.69   CoxAbigail, MD  Active   cetirizine  (ZYRTEC ) 10 MG tablet [id number]  Take 1 tablet (10 mg total) by mouth daily as needed for allergies. Curt Conine, FNP  Active   Chlorophyll 10 MG TABS [id number]  Take 1 tablet by mouth daily. [provider]  Active  Cholecalciferol (VITAMIN D PO) [id number]  Take 2,000 Units by mouth daily.  [provider]  Active   CRESTOR  20 MG tablet [id number]  Take 1 tablet (20 mg total) by mouth daily. [facility] billed secondary to Medicare to cover copay: ID [id number] BIN [id number] PCN [id number] Group [id number] Mindy Cleaves, PA  Active   diclofenac  Sodium (VOLTAREN ) 1 % GEL [id number]  Apply 4 g topically 4 (four) times daily as needed (joint pain). Sherre Clapper, MD  Active   Elderberry 575  MG/5ML SYRP [id number]  Take 5 mLs by mouth daily.  Patient not taking: Reported on 12/24/2023   [provider]  Active   fluticasone  (FLONASE ) 50 MCG/ACT nasal spray [id number]  USE 1 SPRAY IN EACH NOSTRIL EVERY DAY Cox, Kirsten, MD  Active   Glucose Blood (BLOOD GLUCOSE TEST STRIPS 333) STRP [id number]  100 each by In Vitro route 3 (three) times daily before meals. Cox, Kirsten, MD  Active   metFORMIN  (GLUCOPHAGE -XR) 500 MG 24 hr tablet [id number]  TAKE 1 TABLET BY MOUTH TWICE  DAILY Sirivol, Mamatha, MD  Active   metoprolol  (TOPROL -XL) 200 MG 24 hr tablet [id number]  TAKE 1 TABLET(200 MG) BY MOUTH DAILY Craft, Brady, PA  Active   Multiple Vitamins-Minerals (COMPLETE ENERGY) TABS [id number]  Take 1 tablet by mouth daily. [provider]  Active Self  nitroGLYCERIN  (NITROSTAT ) 0.4 MG SL tablet [id number]  Place 1 tablet (0.4 mg total) under the tongue every 5 (five) minutes as needed for chest pain. Mindy Ingram, Mindy Ingram  Active   Omega-3 Fatty Acids (FISH OIL) 1000 MG CAPS [id number]  Take 1 capsule by mouth daily. [provider]  Active   omeprazole  (PRILOSEC) 20 MG capsule [id number]  TAKE 1 CAPSULE BY MOUTH DAILY Cox, Kirsten, MD  Active   prednisoLONE acetate (PRED FORTE) 1 % ophthalmic suspension [id number] No Place 1 drop into the left eye 4 (four) times daily. [provider] Taking Active   traMADol  (ULTRAM ) 50 MG tablet [id number] No Take 50 mg by mouth 2 (two) times daily as needed for pain. [provider] Taking Active   triamcinolone  ointment (KENALOG ) 0.1 % [id number] No Apply 1 application topically 2 (two) times daily.  Patient taking differently: Apply 1 application  topically 2 (two) times daily. As needed   Cox, Clapper, MD Taking Active   Turmeric (QC TUMERIC COMPLEX PO) [id number] No Take 1,000 mg by mouth daily. Every other day [provider] Taking Active   valACYclovir  (VALTREX ) 1000 MG tablet [id number]  Take 1 tablet (1,000 mg total) by  mouth 2 (two) times daily as needed. Mindy Cleaves, PA  Active   valsartan -hydrochlorothiazide  (DIOVAN -HCT) 80-12.5 MG tablet [id number] No Take 1 tablet by mouth daily. Mindy Cleaves, PA Taking Active   Med List Note Vernel, [place] D, MD 03/09/11 1408): CPAP 12, reduced to 10 as of 03/06/2011              Assessment/Plan:   Diabetes: - Currently uncontrolled - Reviewed long term cardiovascular and renal outcomes of uncontrolled blood sugar - Reviewed goal A1c, goal fasting, and goal 2 hour post prandial glucose - Reviewed dietary modifications including: in attempt to to limit late night snacking, we agreed to change from 815pm to 7pm for cut off.  - Patient denies personal or family history of multiple endocrine neoplasia type 2, medullary thyroid  cancer; personal history of pancreatitis or gallbladder disease. - Recommend to check glucose twice daily as is fasting and ppd dinner -  discuss current and previous readings, now has good understanding of how to test properly and that snacks are going to impact any PPD readings. . Maintains on metformin  500 mg BID, would be ok with increase if needed in the near future.     Follow Up Plan: 4wk f/u telephone call.   11/30/2023 update: -FBGs ranging 120s-160s; PPD 130s-190d, one 242.  - no formal exercise currently, does plan to start incorporating exercise 2-3 times per week. Is not sedentary, always on her feet or engaged in community/social activity.  - doing well with current medications, including metformin  500mg  Bid, would be open to bump up to 1500mg /day if needed - is going to work on swapping out yogurt and nuts for her night time snack.  12/28/2023 update: - reviewed labs with patient from last week, will continue to work on lifestyle changes and keep log of BG; informed there was no medication related changes.  - FBG 125, 130, 132, 144, 113 PPD 251 (following, church event), 153, 180, 98, 178, 138, 167, 131 - continues to work on  snacking, especially late at night - plan for 1 month televisit.    Lang Sieve, PharmD, BCGP Clinical Pharmacist  [phone]

## 2023-12-31 ENCOUNTER — Other Ambulatory Visit: Payer: Self-pay | Admitting: Physician Assistant

## 2023-12-31 DIAGNOSIS — I152 Hypertension secondary to endocrine disorders: Secondary | ICD-10-CM

## 2024-01-25 ENCOUNTER — Other Ambulatory Visit: Payer: Self-pay | Admitting: Physician Assistant

## 2024-01-25 ENCOUNTER — Other Ambulatory Visit: Payer: Self-pay

## 2024-01-25 NOTE — Progress Notes (Signed)
 01/25/2024 Name: Mindy Ingram MRN: [id number] DOB: [birth year]  Chief Complaint  Patient presents with   Medication Management   Diabetes    Mindy Ingram is a 78 y.o. year old female who presented for a telephone visit.   They were referred to the pharmacist by their PCP for assistance in managing diabetes.    Subjective:  Care Team: Primary Care Provider: Milon Cleaves, [place] ; Next Scheduled Visit:  Future Appointments  Date Time Provider Department Center  02/22/2024 11:00 AM Pandora Cadet, [facility] None  03/29/2024 10:00 AM Milon Cleaves, PA [facility] None     Medication Access/Adherence  Current Pharmacy:  [facility] [phone] - [place], [place] - [phone] [place] [address] Phone: [phone] Fax: [phone]  [facility] Delivery - [place], [place] - [address] [place] [address] Phone: [phone] Fax: [phone]   Patient reports affordability concerns with their medications: No  Patient reports access/transportation concerns to their pharmacy: No  Patient reports adherence concerns with their medications:  No     Diabetes:  Current medications:  Medications tried in the past:   Current glucose readings:  --Fasting: 131, 132, 141, 128, 145, 105, 106, 141 --PPD after dinner: 195, 173, 144, 173, 143, 179, 141, 149 Using glucose meter; testing two times daily  Patient denies hypoglycemic s/sx including dizziness, shakiness, sweating. Patient denies hyperglycemic symptoms including polyuria, polydipsia, polyphagia, nocturia, neuropathy, blurred vision.  Current meal patterns:  - Biggest issue is late night snacking, will have some popcorn, skinny dip.  - Ok with having almonds, peanuts, etc., as snack   Current physical activity: limited  Current medication access support: none  01/25/2024 update: - concern about popcorn causing higher numbers; some eating  out - FBG: 147, 149, 146, 145, 160. - PPD: highest 232. - walking is difficult d/t safety concerns with dogs near house; is going to try exercising a couple times a week.    Objective:  Lab Results  Component Value Date   HGBA1C 7.3 (H) 12/24/2023    Lab Results  Component Value Date   CREATININE 0.91 12/24/2023   BUN 20 12/24/2023   NA 142 12/24/2023   K 4.0 12/24/2023   CL 102 12/24/2023   CO2 22 12/24/2023    Lab Results  Component Value Date   CHOL 113 12/24/2023   HDL 53 12/24/2023   LDLCALC 39 12/24/2023   TRIG 121 12/24/2023   CHOLHDL 2.1 12/24/2023    Medications Reviewed Today   Medications were not reviewed in this encounter       Assessment/Plan:   Diabetes: - Currently uncontrolled - Reviewed long term cardiovascular and renal outcomes of uncontrolled blood sugar - Reviewed goal A1c, goal fasting, and goal 2 hour post prandial glucose - Reviewed dietary modifications including: in attempt to to limit late night snacking, we agreed to change from 815pm to 7pm for cut off.  - Patient denies personal or family history of multiple endocrine neoplasia type 2, medullary thyroid  cancer; personal history of pancreatitis or gallbladder disease. - Recommend to check glucose twice daily as is fasting and ppd dinner - discuss current and previous readings, now has good understanding of how to test properly and that snacks are going to impact any PPD readings. . Maintains on metformin  500 mg BID, would be ok with increase if needed in the near future.     Follow Up Plan:  4wk f/u telephone call.   11/30/2023 update: -FBGs ranging 120s-160s; PPD 130s-190d, one 242.  - no formal exercise currently, does plan to start incorporating exercise 2-3 times per week. Is not sedentary, always on her feet or engaged in community/social activity.  - doing well with current medications, including metformin  500mg  Bid, would be open to bump up to 1500mg /day if needed - is going  to work on swapping out yogurt and nuts for her night time snack.  12/28/2023 update: - reviewed labs with patient from last week, will continue to work on lifestyle changes and keep log of BG; informed there was no medication related changes.  - FBG 125, 130, 132, 144, 113 PPD 251 (following, church event), 153, 180, 98, 178, 138, 167, 131 - continues to work on snacking, especially late at night - plan for 1 month televisit.   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 Work on cutting out higher carb snacks.  - no med changes at this time d/t pt preference and willingness to continue to make lifestyle changes.  - BG not quite at goal, would consider metformin  xr 750mg  BID next month or time of next pcp visit if sugars worsen. Patient remains open to this if needed.    Lang Sieve, PharmD, BCGP Clinical Pharmacist  [phone]

## 2024-01-30 ENCOUNTER — Other Ambulatory Visit: Payer: Self-pay

## 2024-02-03 ENCOUNTER — Other Ambulatory Visit: Payer: Self-pay | Admitting: Family Medicine

## 2024-02-10 DIAGNOSIS — K635 Polyp of colon: Secondary | ICD-10-CM | POA: Diagnosis not present

## 2024-02-10 DIAGNOSIS — R194 Change in bowel habit: Secondary | ICD-10-CM | POA: Diagnosis not present

## 2024-02-11 ENCOUNTER — Other Ambulatory Visit: Payer: Self-pay | Admitting: Family Medicine

## 2024-02-15 ENCOUNTER — Telehealth: Payer: Self-pay

## 2024-02-15 ENCOUNTER — Other Ambulatory Visit: Payer: Self-pay | Admitting: Physician Assistant

## 2024-02-15 MED ORDER — ACYCLOVIR 400 MG PO TABS: 400.0000 mg | ORAL_TABLET | Freq: Two times a day (BID) | ORAL | 3 refills | Status: AC

## 2024-02-15 NOTE — Telephone Encounter (Unsigned)
 Copied from CRM #[id number]. Topic: Clinical - Prescription Issue >> Feb 12, 2024  2:58 PM Nathanel BROCKS wrote: Reason for CRM:   acyclovir  (ZOVIRAX ) 400 MG tablet pt normally takes this medication and has for a long time. What she was sent was valACYclovir  (VALTREX ) 1000 MG tablet and it cost her 89.00 and she can't pay that. Can you please check on this for the patient.

## 2024-02-22 ENCOUNTER — Other Ambulatory Visit: Payer: Self-pay

## 2024-02-22 NOTE — Progress Notes (Unsigned)
 02/22/2024 Name: ARNIE MAIOLO MRN: [id number] DOB: [birth year]  No chief complaint on file.   JAYLANIE BOSCHEE is a 78 y.o. year old female who presented for a telephone visit.   They were referred to the pharmacist by their PCP for assistance in managing diabetes.    Subjective:  Care Team: Primary Care Provider: Milon Cleaves, [place] ; Next Scheduled Visit:  Future Appointments  Date Time Provider Department Center  03/29/2024 10:00 AM Milon Cleaves, [place] [facility] None   Lab Results  Component Value Date   HGBA1C 7.3 (H) 12/24/2023   HGBA1C 7.6 (H) 09/01/2023   HGBA1C 6.6 (H) 01/29/2023   HGBA1C 7.6 (H) 06/10/2022   HGBA1C 7.4 (H) 02/28/2022   HGBA1C 7.2 (H) 11/19/2021   HGBA1C 6.9 (H) 08/06/2021   HGBA1C 6.8 (H) 04/08/2021   HGBA1C 6.8 (H) 12/25/2020   HGBA1C 6.7 (H) 09/20/2020     Medication Access/Adherence  Current Pharmacy:  [id number] - [place], [place] - [address] [place] [address] Phone: [phone] Fax: [phone]  [facility] - [place], [place] - [address] [place] [address] Phone: [phone] Fax: [phone]   Patient reports affordability concerns with their medications: No  Patient reports access/transportation concerns to their pharmacy: No  Patient reports adherence concerns with their medications:  No     Diabetes:  Current medications:  Medications tried in the past:   Current glucose readings:  --Fasting: 131, 132, 141, 128, 145, 105, 106, 141 --PPD after dinner: 195, 173, 144, 173, 143, 179, 141, 149 Using glucose meter; testing two times daily  Patient denies hypoglycemic s/sx including dizziness, shakiness, sweating. Patient denies hyperglycemic symptoms including polyuria, polydipsia, polyphagia, nocturia, neuropathy, blurred vision.  Current meal patterns:  - Biggest issue is late night snacking, will have some popcorn,  skinny dip.  - Ok with having almonds, peanuts, etc., as snack   Current physical activity: limited  Current medication access support: none  01/25/2024 update: - concern about popcorn causing higher numbers; some eating out - FBG: 147, 149, 146, 145, 160. - PPD: highest 232. - walking is difficult d/t safety concerns with dogs near house; is going to try exercising a couple times a week.    Objective:  Lab Results  Component Value Date   HGBA1C 7.3 (H) 12/24/2023    Lab Results  Component Value Date   CREATININE 0.91 12/24/2023   BUN 20 12/24/2023   NA 142 12/24/2023   K 4.0 12/24/2023   CL 102 12/24/2023   CO2 22 12/24/2023    Lab Results  Component Value Date   CHOL 113 12/24/2023   HDL 53 12/24/2023   LDLCALC 39 12/24/2023   TRIG 121 12/24/2023   CHOLHDL 2.1 12/24/2023    Medications Reviewed Today   Medications were not reviewed in this encounter       Assessment/Plan:   Diabetes: - Currently uncontrolled - Reviewed long term cardiovascular and renal outcomes of uncontrolled blood sugar - Reviewed goal A1c, goal fasting, and goal 2 hour post prandial glucose - Reviewed dietary modifications including: in attempt to to limit late night snacking, we agreed to change from 815pm to 7pm for cut off.  - Patient denies personal or family history of multiple endocrine neoplasia type 2, medullary thyroid  cancer; personal history of pancreatitis or gallbladder disease. - Recommend to check glucose twice daily as is fasting and ppd dinner -  discuss current and previous readings, now has good understanding of how to test properly and that snacks are going to impact any PPD readings. . Maintains on metformin  500 mg BID, would be ok with increase if needed in the near future.     Follow Up Plan: 4wk f/u telephone call.   11/30/2023 update: -FBGs ranging 120s-160s; PPD 130s-190d, one 242.  - no formal exercise currently, does plan to start incorporating exercise 2-3  times per week. Is not sedentary, always on her feet or engaged in community/social activity.  - doing well with current medications, including metformin  500mg  Bid, would be open to bump up to 1500mg /day if needed - is going to work on swapping out yogurt and nuts for her night time snack.  12/28/2023 update: - reviewed labs with patient from last week, will continue to work on lifestyle changes and keep log of BG; informed there was no medication related changes.  - FBG 125, 130, 132, 144, 113 PPD 251 (following, church event), 153, 180, 98, 178, 138, 167, 131 - continues to work on snacking, especially late at night - plan for 1 month televisit.   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 Work on cutting out higher carb snacks.  - no med changes at this time d/t pt preference and willingness to continue to make lifestyle changes.  - BG not quite at goal, would consider metformin  xr 750mg  BID next month or time of next pcp visit if sugars worsen. Patient remains open to this if needed.   02/22/2024: - 7 day avg - 168. Mixed FBG and PPD 120s-170s - noted to be taking chromium supplement - no concerns noted. - had not started walking   Lang Sieve, PharmD, [facility] Clinical Pharmacist  [phone]

## 2024-02-26 DIAGNOSIS — G4733 Obstructive sleep apnea (adult) (pediatric): Secondary | ICD-10-CM | POA: Insufficient documentation

## 2024-03-01 ENCOUNTER — Encounter: Payer: Self-pay | Admitting: Cardiology

## 2024-03-01 ENCOUNTER — Ambulatory Visit: Attending: Cardiology | Admitting: Cardiology

## 2024-03-01 ENCOUNTER — Telehealth: Payer: Self-pay | Admitting: Physician Assistant

## 2024-03-01 VITALS — BP 136/82 | HR 74 | Ht 59.6 in | Wt 138.6 lb

## 2024-03-01 DIAGNOSIS — E782 Mixed hyperlipidemia: Secondary | ICD-10-CM | POA: Diagnosis not present

## 2024-03-01 DIAGNOSIS — G4733 Obstructive sleep apnea (adult) (pediatric): Secondary | ICD-10-CM

## 2024-03-01 DIAGNOSIS — E1169 Type 2 diabetes mellitus with other specified complication: Secondary | ICD-10-CM | POA: Diagnosis not present

## 2024-03-01 DIAGNOSIS — E1159 Type 2 diabetes mellitus with other circulatory complications: Secondary | ICD-10-CM

## 2024-03-01 DIAGNOSIS — I10 Essential (primary) hypertension: Secondary | ICD-10-CM

## 2024-03-01 DIAGNOSIS — I152 Hypertension secondary to endocrine disorders: Secondary | ICD-10-CM

## 2024-03-01 NOTE — Patient Instructions (Signed)

## 2024-03-01 NOTE — Telephone Encounter (Signed)
 Mindy Ingram,  Our office tried to contact you by phone regarding your upcoming appointment for 03/03/2024. Unfortunately, the appointment had to be canceled due to the provider being out of the office. Please call the office back at [phone] to get this appointment rescheduled.  Thank you for your time and we look forward to speaking with you.

## 2024-03-01 NOTE — Progress Notes (Signed)
 Cardiology Office Note:    Date:  03/01/2024   ID:  Mindy Ingram, DOB 01-Feb-1946, MRN [id number]  PCP:  Milon Cleaves, PA  Cardiologist:  Jennifer JONELLE Crape, MD   Referring MD: Milon Cleaves, PA    ASSESSMENT:    1. Mixed hyperlipidemia   2. Essential hypertension   3. Hypertension associated with diabetes (HCC)   4. Obstructive sleep apnea syndrome   5. DM type 2 with diabetic mixed hyperlipidemia (HCC)    PLAN:    In order of problems listed above:  Primary prevention stressed with the patient.  Importance of compliance with diet medication stressed and patient verbalized standing. She was advised to ambulate to the best of her ability. Essential hypertension: Blood pressure is stable and diet was emphasized. Mixed dyslipidemia: On lipid-lowering medications followed by primary care.  Lipids are reviewed from [facility] sheet and found to be at goal Diabetes mellitus: Managed by primary care.  Diet emphasized.  Weight reduction stressed.  A1c discussed from [facility] sheet and I told her to be cautious she promises to do so. Results of echo and stress test discussed with her at length. Patient will be seen in follow-up appointment in 12 months or earlier if the patient has any concerns.    Medication Adjustments/Labs and Tests Ordered: Current medicines are reviewed at length with the patient today.  Concerns regarding medicines are outlined above.  Orders Placed This Encounter  Procedures   EKG 12-Lead   No orders of the defined types were placed in this encounter.    No chief complaint on file.    History of Present Illness:    Mindy Ingram is a 78 y.o. female.  Patient has past medical history of essential hypertension, mixed dyslipidemia, diabetes mellitus.  She denies any problems at this time and takes care of activities of daily living.  She leads a sedentary lifestyle because of orthopedic issues involving left knee.  At the time of my evaluation, the patient is  alert awake oriented and in no distress.  Past Medical History:  Diagnosis Date   Acute URI 02/09/2023   Allergic asthma, mild intermittent, uncomplicated 10/17/2007   Office Spirometry 04/03/2015- WNL FVC 2.14/110%, FEV1 1.94/128%, FEV1/FVC 0.91  Office spirometry- 11/04/16--by allergy  office- FEV1/FVC 0.77 WNL   Bilateral impacted cerumen 07/01/2018   BMI 29.0-29.9,adult 06/10/2022   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   Cervical radicular pain 01/20/2022   Chronic back pain    Chronic pain of both knees 02/07/2023   Contusion of head, initial encounter 03/18/2021   Corn of toe 06/25/2020   Cough, persistent 09/15/2023   Degeneration of lumbosacral intervertebral disc 01/26/2012   DJD (degenerative joint disease)    DM type 2 with diabetic mixed hyperlipidemia (HCC) 04/08/2021   Dyslipidemia 01/23/2015   Essential hypertension 01/23/2015   Essential hypertension, benign 01/23/2015   Fibromyalgia    GERD 10/17/2007   Qualifier: Diagnosis of  By: Neysa MD, Clinton D    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   History of influenza 09/15/2023   Hyperlipidemia 06/25/2020   Hypertension associated with diabetes (HCC) 01/23/2015   Influenza A virus present 08/25/2023   Lab test positive for detection of COVID-19 virus 02/09/2023   Lumbar facet arthropathy 01/20/2022   Lumbar spondylolysis 02/10/2012   Menopause 06/25/2020   Metatarsalgia of both feet 09/18/2016   Multiple benign melanocytic nevi 06/25/2020   Myalgia 03/03/2022   Myalgia due to statin 03/03/2022   Neck pain  12/12/2021   Need for hepatitis C screening test 02/08/2023   Obesity 07/10/2021   Obstructive sleep apnea syndrome    NPSG 11/12/10- AHI  17.9/ hr, desat to 76% titrated to CPAP 12, body weight 165 lbs     Using CPAP every night     OSA (obstructive sleep apnea)    cpap setting of 10   Pain 03/24/2018   Preop cardiovascular exam 06/10/2022   Right rotator cuff tear 06/25/2020   Right temporal  frontal scalp contusions 03/18/2021   S/P lumbar fusion 03/16/2012   Screening for colorectal cancer 12/24/2023   Seasonal and perennial allergic rhinitis 10/17/2007   Allergy  vaccine 2002- dc'd   Shingles    Shoulder joint pain 06/25/2020   Sore throat 08/25/2023   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   Statin myopathy 06/10/2022   Thoracic spine pain 02/23/2013   Type II or unspecified type diabetes mellitus without mention of complication, not stated as uncontrolled     Past Surgical History:  Procedure Laterality Date   APPENDECTOMY  1995   BACK SURGERY  2013   lower back with plates and screws   benign lump right axilla     BILATERAL SALPINGOOPHORECTOMY  1995   CERVICAL SPINE SURGERY  1992   at [facility], slight limitation with turning neck   CHOLECYSTECTOMY  1995   EUS N/A 07/21/2013   Procedure: UPPER ENDOSCOPIC ULTRASOUND (EUS) LINEAR;  Surgeon: Toribio SHAUNNA Cedar, MD;  Location: [facility];  Service: Endoscopy;  Laterality: N/A;   FOOT SURGERY     2007 - right great toe, 2000 - right fifth digit .   NEUROPLASTY / TRANSPOSITION MEDIAN NERVE AT CARPAL TUNNEL BILATERAL  1995   PARTIAL COLECTOMY  1995   benign adhesions   PARTIAL HYSTERECTOMY  1980s.   abdominal   right foot surgery  2000   right great toe with artificial bone inserted   right knee arthroscopy Right 02/2017    Current Medications: Current Meds  Medication Sig   acyclovir  (ZOVIRAX ) 400 MG tablet Take 1 tablet (400 mg total) by mouth 2 (two) times daily. (Patient taking differently: Take 400 mg by mouth as needed (rash outbreak).)   aspirin EC 81 MG tablet Take 81 mg by mouth daily.   azelastine  (ASTELIN ) 0.1 % nasal spray Place 1 spray into both nostrils 2 (two) times daily as needed for allergies or rhinitis.   B-D ULTRA-FINE 33 LANCETS MISC 1 each by Does not apply route in the morning and at bedtime.   Blood Glucose Monitoring Suppl (ONE TOUCH ULTRA 2) w/Device KIT  Use as instructed five times daily. E11.69   cetirizine  (ZYRTEC ) 10 MG tablet Take 1 tablet (10 mg total) by mouth daily as needed for allergies.   Chlorophyll 10 MG TABS Take 1 tablet by mouth daily.   Cholecalciferol (VITAMIN D PO) Take 2,000 Units by mouth daily.    CRESTOR  20 MG tablet Take 1 tablet (20 mg total) by mouth daily. [facility] billed secondary to Medicare to cover copay: ID [id number] BIN [id number] PCN [id number] Group [id number]   diclofenac  Sodium (VOLTAREN ) 1 % GEL Apply 4 g topically 4 (four) times daily as needed (joint pain).   Elderberry 575 MG/5ML SYRP Take 5 mLs by mouth daily.   fluticasone  (FLONASE ) 50 MCG/ACT nasal spray USE 1 SPRAY IN EACH NOSTRIL EVERY DAY   Glucose Blood (BLOOD GLUCOSE TEST STRIPS 333) STRP 100 each by In Vitro route 3 (three) times  daily before meals.   hydroxypropyl methylcellulose / hypromellose (ISOPTO TEARS / GONIOVISC) 2.5 % ophthalmic solution Place 1 drop into both eyes daily.   metFORMIN  (GLUCOPHAGE -XR) 500 MG 24 hr tablet TAKE 1 TABLET BY MOUTH TWICE  DAILY   metoprolol  (TOPROL -XL) 200 MG 24 hr tablet TAKE 1 TABLET(200 MG) BY MOUTH DAILY   Multiple Vitamins-Minerals (COMPLETE ENERGY) TABS Take 1 tablet by mouth daily.   nitroGLYCERIN  (NITROSTAT ) 0.4 MG SL tablet Place 1 tablet (0.4 mg total) under the tongue every 5 (five) minutes as needed for chest pain.   Omega-3 Fatty Acids (FISH OIL) 1000 MG CAPS Take 1 capsule by mouth daily.   omeprazole  (PRILOSEC) 20 MG capsule TAKE 1 CAPSULE BY MOUTH DAILY (Patient taking differently: Take 20 mg by mouth as needed (heartburn or indigestion).)   traMADol  (ULTRAM ) 50 MG tablet Take 50 mg by mouth 2 (two) times daily as needed for pain.   triamcinolone  ointment (KENALOG ) 0.1 % Apply 1 application topically 2 (two) times daily. (Patient taking differently: Apply 1 application  topically as needed (rash).)   valsartan -hydrochlorothiazide  (DIOVAN -HCT) 80-12.5 MG tablet TAKE 1 TABLET BY MOUTH DAILY    [DISCONTINUED] Turmeric (QC TUMERIC COMPLEX PO) Take 1,000 mg by mouth daily. Every other day     Allergies:   Dairy aid [tilactase], Lactose, Atorvastatin, Hydrocodone-acetaminophen, Hydrocodone-acetaminophen, Milk-related compounds, Mirabegron, Oxycontin [oxycodone hcl], Prochlorperazine edisylate, Rosuvastatin , Shellfish allergy , Umeclidinium-vilanterol, and Percodan [oxycodone-aspirin]   Social History   Socioeconomic History   Marital status: Widowed    Spouse name: Not on file   Number of children: 1   Years of education: Not on file   Highest education level: Not on file  Occupational History   Occupation: Retired  Tobacco Use   Smoking status: Never   Smokeless tobacco: Never  Vaping Use   Vaping status: Never Used  Substance and Sexual Activity   Alcohol use: No   Drug use: No   Sexual activity: Not on file  Other Topics Concern   Not on file  Social History Narrative   Not on file   Social Drivers of Health   Financial Resource Strain: Low Risk  (08/06/2023)   Overall Financial Resource Strain (CARDIA)    Difficulty of Paying Living Expenses: Not very hard  Food Insecurity: No Food Insecurity (08/06/2023)   Hunger Vital Sign    Worried About Running Out of Food in the Last Year: Never true    Ran Out of Food in the Last Year: Never true  Transportation Needs: No Transportation Needs (08/06/2023)   PRAPARE - Administrator, Civil Service (Medical): No    Lack of Transportation (Non-Medical): No  Physical Activity: Insufficiently Active (08/06/2023)   Exercise Vital Sign    Days of Exercise per Week: 2 days    Minutes of Exercise per Session: 40 min  Stress: No Stress Concern Present (08/06/2023)   [employer] of Occupational Health - Occupational Stress Questionnaire    Feeling of Stress : Not at all  Social Connections: Moderately Isolated (08/06/2023)   Social Connection and Isolation Panel    Frequency of Communication with Friends and Family: More  than three times a week    Frequency of Social Gatherings with Friends and Family: More than three times a week    Attends Religious Services: More than 4 times per year    Active Member of [employer] or Organizations: No    Attends Banker Meetings: Never    Marital Status: Widowed  Family History: The patient's family history includes Breast cancer in her maternal aunt; Heart attack in her sister; Heart disease in her mother; Prostate cancer in her father and paternal uncle. There is no history of Allergies, Asthma, Eczema, or Immunodeficiency.  ROS:   Please see the history of present illness.    All other systems reviewed and are negative.  EKGs/Labs/Other Studies Reviewed:    The following studies were reviewed today: I discussed my findings with the patient at length   Recent Labs: 12/24/2023: ALT 11; BUN 20; Creatinine, Ser 0.91; Hemoglobin 13.5; Platelets 285; Potassium 4.0; Sodium 142; TSH 0.693  Recent Lipid Panel    Component Value Date/Time   CHOL 113 12/24/2023 1145   TRIG 121 12/24/2023 1145   HDL 53 12/24/2023 1145   CHOLHDL 2.1 12/24/2023 1145   LDLCALC 39 12/24/2023 1145    Physical Exam:    VS:  BP 136/82   Pulse 74   Ht 4' 11.6 (1.514 m)   Wt 138 lb 9.6 oz (62.9 kg)   SpO2 96%   BMI 27.43 kg/m     Wt Readings from Last 3 Encounters:  03/01/24 138 lb 9.6 oz (62.9 kg)  12/24/23 137 lb 8 oz (62.4 kg)  12/02/23 144 lb 3.2 oz (65.4 kg)     GEN: Patient is in no acute distress HEENT: Normal NECK: No JVD; No carotid bruits LYMPHATICS: No lymphadenopathy CARDIAC: Hear sounds regular, 2/6 systolic murmur at the apex. RESPIRATORY:  Clear to auscultation without rales, wheezing or rhonchi  ABDOMEN: Soft, non-tender, non-distended MUSCULOSKELETAL:  No edema; No deformity  SKIN: Warm and dry NEUROLOGIC:  Alert and oriented x 3 PSYCHIATRIC:  Normal affect   Signed, Jennifer JONELLE Crape, MD  03/01/2024 11:08 AM    [facility]  [facility]

## 2024-03-03 ENCOUNTER — Ambulatory Visit: Admitting: Physician Assistant

## 2024-03-09 ENCOUNTER — Ambulatory Visit: Admitting: Physician Assistant

## 2024-03-21 ENCOUNTER — Telehealth: Payer: Self-pay | Admitting: Internal Medicine

## 2024-03-21 DIAGNOSIS — G4733 Obstructive sleep apnea (adult) (pediatric): Secondary | ICD-10-CM

## 2024-03-21 NOTE — Telephone Encounter (Signed)
 New order sent to [facility]

## 2024-03-21 NOTE — Telephone Encounter (Signed)
 Copied from CRM [id number]. Topic: Medical Record Request - Provider/Facility Request >> Mar 21, 2024  1:47 PM Leotis ORN wrote: Reason for CRM: Delon with [facility] is requesting the most  recent OV notes as well as a copy of the patient's Rx for CPAP supplies be faxed too [phone]  Callback number [phone]

## 2024-03-22 NOTE — Telephone Encounter (Signed)
 Order has been sent. NFN

## 2024-03-29 ENCOUNTER — Ambulatory Visit: Admitting: Physician Assistant

## 2024-03-29 ENCOUNTER — Encounter: Payer: Self-pay | Admitting: Physician Assistant

## 2024-03-29 VITALS — BP 118/72 | HR 76 | Temp 97.8°F | Ht 59.5 in | Wt 137.8 lb

## 2024-03-29 DIAGNOSIS — G4733 Obstructive sleep apnea (adult) (pediatric): Secondary | ICD-10-CM

## 2024-03-29 DIAGNOSIS — M7742 Metatarsalgia, left foot: Secondary | ICD-10-CM | POA: Diagnosis not present

## 2024-03-29 DIAGNOSIS — L219 Seborrheic dermatitis, unspecified: Secondary | ICD-10-CM | POA: Diagnosis not present

## 2024-03-29 DIAGNOSIS — D229 Melanocytic nevi, unspecified: Secondary | ICD-10-CM | POA: Diagnosis not present

## 2024-03-29 DIAGNOSIS — K219 Gastro-esophageal reflux disease without esophagitis: Secondary | ICD-10-CM

## 2024-03-29 DIAGNOSIS — E1169 Type 2 diabetes mellitus with other specified complication: Secondary | ICD-10-CM | POA: Diagnosis not present

## 2024-03-29 DIAGNOSIS — I152 Hypertension secondary to endocrine disorders: Secondary | ICD-10-CM

## 2024-03-29 DIAGNOSIS — E782 Mixed hyperlipidemia: Secondary | ICD-10-CM

## 2024-03-29 DIAGNOSIS — E1159 Type 2 diabetes mellitus with other circulatory complications: Secondary | ICD-10-CM | POA: Diagnosis not present

## 2024-03-29 DIAGNOSIS — M7741 Metatarsalgia, right foot: Secondary | ICD-10-CM | POA: Diagnosis not present

## 2024-03-29 DIAGNOSIS — H6123 Impacted cerumen, bilateral: Secondary | ICD-10-CM | POA: Diagnosis not present
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
## 2024-03-29 NOTE — Assessment & Plan Note (Signed)
 New and existing lesions with discoloration and size changes. Last dermatology evaluation was years ago. - Refer to dermatology for evaluation. Orders:   Ambulatory referral to Dermatology

## 2024-03-29 NOTE — Assessment & Plan Note (Addendum)
 Type 2 diabetes mellitus with diabetic neuropathy Blood sugar fluctuates with morning hyperglycemia. Diabetic neuropathy indicated by burning sensation in extremities. Metformin  ER better tolerated. Gabapentin discussed but not started due to infrequent symptoms. - Order A1c test. - Continue dietary modifications. - Consider gabapentin if symptoms worsen. Orders:   Hemoglobin A1c

## 2024-03-29 NOTE — Assessment & Plan Note (Addendum)
 Metatarsalgia, right foot Post-surgical pain with plantar fasciitis symptoms. Previous boot use was beneficial. - Use previous boot.

## 2024-03-29 NOTE — Assessment & Plan Note (Deleted)
 Mindy Ingram

## 2024-03-29 NOTE — Assessment & Plan Note (Addendum)
 Well controlled.  Continue to work on eating a healthy diet and exercise.  Labs drawn today.   No major side effects reported, and no issues with compliance. The current medical regimen is effective;  continue present plan with Metoprolol  200mg , amlodipine  5mg , Hyzaar Will adjust medication as needed depending on labs BP Readings from Last 3 Encounters:  03/29/24 118/72  03/01/24 136/82  12/24/23 110/70    Orders:   CBC with Differential/Platelet   Comprehensive metabolic panel with GFR

## 2024-03-29 NOTE — Assessment & Plan Note (Signed)
 Irrigated and removed impaction today Continue to monitor

## 2024-03-29 NOTE — Assessment & Plan Note (Addendum)
The current medical regimen is effective; continue present plan and medications. Omeprazole 20 mg daily. 

## 2024-03-29 NOTE — Assessment & Plan Note (Addendum)
 Controlled Continue to monitor diet and exercise Labs drawn today Will adjust treatment based on results Lab Results  Component Value Date   LDLCALC 39 12/24/2023    Orders:   Lipid panel

## 2024-03-29 NOTE — Assessment & Plan Note (Addendum)
 Controlled Denies any new or worsening symptoms Will adjust treatment based on symptoms

## 2024-03-29 NOTE — Assessment & Plan Note (Addendum)
 Controlled Continue to monitor for any new or worsening rashes Follow up with dermatology Orders:   Ambulatory referral to Dermatology

## 2024-03-30 LAB — CBC WITH DIFFERENTIAL/PLATELET
Basophils Absolute: 0 x10E3/uL (ref 0.0–0.2)
Basos: 1 %
EOS (ABSOLUTE): 0.1 x10E3/uL (ref 0.0–0.4)
Eos: 2 %
Hematocrit: 41 % (ref 34.0–46.6)
Hemoglobin: 13.2 g/dL (ref 11.1–15.9)
Immature Grans (Abs): 0 x10E3/uL (ref 0.0–0.1)
Immature Granulocytes: 0 %
Lymphocytes Absolute: 2.1 x10E3/uL (ref 0.7–3.1)
Lymphs: 46 %
MCH: 30.9 pg (ref 26.6–33.0)
MCHC: 32.2 g/dL (ref 31.5–35.7)
MCV: 96 fL (ref 79–97)
Monocytes Absolute: 0.4 x10E3/uL (ref 0.1–0.9)
Monocytes: 9 %
Neutrophils Absolute: 1.8 x10E3/uL (ref 1.4–7.0)
Neutrophils: 42 %
Platelets: 299 x10E3/uL (ref 150–450)
RBC: 4.27 x10E6/uL (ref 3.77–5.28)
RDW: 13.5 % (ref 11.7–15.4)
WBC: 4.3 x10E3/uL (ref 3.4–10.8)

## 2024-03-30 LAB — COMPREHENSIVE METABOLIC PANEL WITH GFR
ALT: 12 IU/L (ref 0–32)
AST: 22 IU/L (ref 0–40)
Albumin: 4.6 g/dL (ref 3.8–4.8)
Alkaline Phosphatase: 46 IU/L — ABNORMAL LOW (ref 49–135)
BUN/Creatinine Ratio: 16 (ref 12–28)
BUN: 15 mg/dL (ref 8–27)
Bilirubin Total: 0.3 mg/dL (ref 0.0–1.2)
CO2: 24 mmol/L (ref 20–29)
Calcium: 10 mg/dL (ref 8.7–10.3)
Chloride: 101 mmol/L (ref 96–106)
Creatinine, Ser: 0.94 mg/dL (ref 0.57–1.00)
Globulin, Total: 2.7 g/dL (ref 1.5–4.5)
Glucose: 125 mg/dL — ABNORMAL HIGH (ref 70–99)
Potassium: 3.9 mmol/L (ref 3.5–5.2)
Sodium: 142 mmol/L (ref 134–144)
Total Protein: 7.3 g/dL (ref 6.0–8.5)
eGFR: 62 mL/min/1.73 (ref >59)

## 2024-03-30 LAB — LIPID PANEL
Chol/HDL Ratio: 2.2 ratio (ref 0.0–4.4)
Cholesterol, Total: 111 mg/dL (ref 100–199)
HDL: 50 mg/dL (ref >39)
LDL Chol Calc (NIH): 44 mg/dL (ref 0–99)
Triglycerides: 90 mg/dL (ref 0–149)
VLDL Cholesterol Cal: 17 mg/dL (ref 5–40)

## 2024-03-30 LAB — HEMOGLOBIN A1C
Est. average glucose Bld gHb Est-mCnc: 169 mg/dL
Hgb A1c MFr Bld: 7.5 % — ABNORMAL HIGH (ref 4.8–5.6)

## 2024-03-31 ENCOUNTER — Ambulatory Visit: Payer: Self-pay | Admitting: Physician Assistant

## 2024-04-05 ENCOUNTER — Other Ambulatory Visit (INDEPENDENT_AMBULATORY_CARE_PROVIDER_SITE_OTHER): Payer: Self-pay

## 2024-04-05 DIAGNOSIS — E1169 Type 2 diabetes mellitus with other specified complication: Secondary | ICD-10-CM

## 2024-04-05 DIAGNOSIS — E782 Mixed hyperlipidemia: Secondary | ICD-10-CM

## 2024-04-05 DIAGNOSIS — E1159 Type 2 diabetes mellitus with other circulatory complications: Secondary | ICD-10-CM

## 2024-04-05 DIAGNOSIS — I152 Hypertension secondary to endocrine disorders: Secondary | ICD-10-CM

## 2024-04-05 NOTE — Progress Notes (Signed)
 04/05/2024 Name: Mindy Ingram MRN: [id number] DOB: 02-Jan-1946  Chief Complaint  Patient presents with   Diabetes    Mindy Ingram is a 78 y.o. year old female who presented for a telephone visit.   They were referred to the pharmacist by their PCP for assistance in managing diabetes.    Subjective:  Care Team: Primary Care Provider: Milon Cleaves, [place] ; Next Scheduled Visit:  Future Appointments  Date Time Provider Department Center  04/05/2024 11:00 AM Pandora Cadet, [facility] None  05/02/2024  9:30 AM Pandora Cadet, RPH [facility] None  07/12/2024 11:00 AM Milon Cleaves, PA [facility] [place]   Lab Results  Component Value Date   HGBA1C 7.5 (H) 03/29/2024   HGBA1C 7.3 (H) 12/24/2023   HGBA1C 7.6 (H) 09/01/2023   HGBA1C 6.6 (H) 01/29/2023   HGBA1C 7.6 (H) 06/10/2022   HGBA1C 7.4 (H) 02/28/2022   HGBA1C 7.2 (H) 11/19/2021   HGBA1C 6.9 (H) 08/06/2021   HGBA1C 6.8 (H) 04/08/2021   HGBA1C 6.8 (H) 12/25/2020   HGBA1C 6.7 (H) 09/20/2020     Medication Access/Adherence  Current Pharmacy:  [id number] - [place], [place] - [address] [place] [address] Phone: [phone] Fax: [phone]  [facility] Delivery - [place], [place] - [address] [place] [address] Phone: [phone] Fax: [phone]   Patient reports affordability concerns with their medications: No  Patient reports access/transportation concerns to their pharmacy: No  Patient reports adherence concerns with their medications:  No     Diabetes:  Current medications:  Medications tried in the past:   Current glucose readings:  --Fasting: 150s or less Using glucose meter; testing two times daily  Patient denies hypoglycemic s/sx including dizziness, shakiness, sweating. Patient denies hyperglycemic symptoms including polyuria, polydipsia, polyphagia, nocturia, neuropathy, blurred vision.  Current meal  patterns:  - Biggest issue is late night snacking, will have some popcorn, skinny dip.  - Ok with having almonds, peanuts, etc., as snack   Current physical activity: limited  Current medication access support: none  Objective:  Lab Results  Component Value Date   HGBA1C 7.5 (H) 03/29/2024    Lab Results  Component Value Date   CREATININE 0.94 03/29/2024   BUN 15 03/29/2024   NA 142 03/29/2024   K 3.9 03/29/2024   CL 101 03/29/2024   CO2 24 03/29/2024    Lab Results  Component Value Date   CHOL 111 03/29/2024   HDL 50 03/29/2024   LDLCALC 44 03/29/2024   TRIG 90 03/29/2024   CHOLHDL 2.2 03/29/2024    Medications Reviewed Today     Reviewed by Pandora Cadet, [facility] (Pharmacist) on 04/05/24 at 1013  Med List Status: <None>   Medication Order Taking? Sig Documenting Provider Last Dose Status Informant  acyclovir  (ZOVIRAX ) 400 MG tablet [id number]  Take 1 tablet (400 mg total) by mouth 2 (two) times daily.  Patient taking differently: Take 400 mg by mouth as needed (rash outbreak).   Milon Cleaves, [place]  Active   aspirin EC 81 MG tablet [id number]  Take 81 mg by mouth daily. [provider]  Active   azelastine  (ASTELIN ) 0.1 % nasal spray [id number]  Place 1 spray into both nostrils 2 (two) times daily as needed for allergies or rhinitis. [provider]  Active   B-D ULTRA-FINE 33 LANCETS MISC [id number]  1 each by Does not apply route in the  morning and at bedtime. Milon Cleaves, PA  Active   Blood Glucose Monitoring Suppl (ONE TOUCH ULTRA 2) w/Device KIT [id number]  Use as instructed five times daily. E11.69 CoxAbigail, MD  Active   cetirizine  (ZYRTEC ) 10 MG tablet [id number]  Take 1 tablet (10 mg total) by mouth daily as needed for allergies. Curt Conine, FNP  Active   Chlorophyll 10 MG TABS [id number]  Take 1 tablet by mouth daily. [provider]  Active   Cholecalciferol (VITAMIN D PO) [id number]  Take 2,000 Units by mouth daily.  [provider]  Active   CRESTOR  20 MG tablet [id number]  Take 1 tablet (20 mg total) by mouth daily. [employer] billed secondary to Medicare to cover copay: ID [id number] BIN [id number] PCN [id number] Group [id number] Milon Cleaves, PA  Active   diclofenac  Sodium (VOLTAREN ) 1 % GEL [id number]  Apply 4 g topically 4 (four) times daily as needed (joint pain). Sherre Abigail, MD  Active   Elderberry 575 MG/5ML SYRP [id number]  Take 5 mLs by mouth daily. [provider]  Active   fluticasone  (FLONASE ) 50 MCG/ACT nasal spray [id number]  USE 1 SPRAY IN EACH NOSTRIL EVERY DAY Cox, Kirsten, MD  Active   Glucose Blood (BLOOD GLUCOSE TEST STRIPS 333) STRP [id number]  100 each by In Vitro route 3 (three) times daily before meals. Cox, Kirsten, MD  Active   hydroxypropyl methylcellulose / hypromellose (ISOPTO TEARS / GONIOVISC) 2.5 % ophthalmic solution [id number]  Place 1 drop into both eyes daily. [provider]  Active   metFORMIN  (GLUCOPHAGE -XR) 500 MG 24 hr tablet [id number]  TAKE 1 TABLET BY MOUTH TWICE  DAILY Craft, Cleaves, [place]  Active   metoprolol  (TOPROL -XL) 200 MG 24 hr tablet [id number]  TAKE 1 TABLET(200 MG) BY MOUTH DAILY Craft, Cleaves, PA  Active   Multiple Vitamins-Minerals (COMPLETE ENERGY) TABS [id number]  Take 1 tablet by mouth daily. [provider]  Active Self  nitroGLYCERIN  (NITROSTAT ) 0.4 MG SL tablet [id number]  Place 1 tablet (0.4 mg total) under the tongue every 5 (five) minutes as needed for chest pain. Milon Cleaves, [place]  Active   Omega-3 Fatty Acids (FISH OIL) 1000 MG CAPS [id number]  Take 1 capsule by mouth daily. [provider]  Active   omeprazole  (PRILOSEC) 20 MG capsule [id number]  TAKE 1 CAPSULE BY MOUTH DAILY Cox, Kirsten, MD  Active   traMADol  (ULTRAM ) 50 MG tablet [id number]  Take 50 mg by mouth 2 (two) times daily as needed for pain. [provider]  Active   triamcinolone  ointment (KENALOG ) 0.1 % [id number]  Apply 1 application topically 2 (two) times daily.   Patient taking differently: Apply 1 application  topically as needed (rash).   Cox, Kirsten, MD  Active   valsartan -hydrochlorothiazide  (DIOVAN -HCT) 80-12.5 MG tablet [id number]  TAKE 1 TABLET BY MOUTH DAILY Milon Cleaves, [place]  Active   Med List Note Vernel, [place] D, MD 03/09/11 1408): CPAP 12, reduced to 10 as of 03/06/2011              Assessment/Plan:   Diabetes: - Currently uncontrolled - Reviewed long term cardiovascular and renal outcomes of uncontrolled blood sugar - Reviewed goal A1c, goal fasting, and goal 2 hour post prandial glucose - Reviewed dietary modifications including: in attempt to to limit late night snacking, we agreed to change from 815pm to 7pm for cut off.  - Patient denies personal or family history of multiple endocrine neoplasia type 2, medullary thyroid  cancer; personal  history of pancreatitis or gallbladder disease. - Recommend to check glucose twice daily as is fasting and ppd dinner - discuss current and previous readings, now has good understanding of how to test properly and that snacks are going to impact any PPD readings. . Maintains on metformin  500 mg BID, would be ok with increase if needed in the near future.     Follow Up Plan: 4wk f/u telephone call.   11/30/2023 update: -FBGs ranging 120s-160s; PPD 130s-190d, one 242.  - no formal exercise currently, does plan to start incorporating exercise 2-3 times per week. Is not sedentary, always on her feet or engaged in community/social activity.  - doing well with current medications, including metformin  500mg  Bid, would be open to bump up to 1500mg /day if needed - is going to work on swapping out yogurt and nuts for her night time snack.  12/28/2023 update: - reviewed labs with patient from last week, will continue to work on lifestyle changes and keep log of BG; informed there was no medication related changes.  - FBG 125, 130, 132, 144, 113 PPD 251 (following, church event), 153, 180, 98, 178,  138, 167, 131 - continues to work on snacking, especially late at night - plan for 1 month televisit.   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 Work on cutting out higher carb snacks.  - no med changes at this time d/t pt preference and willingness to continue to make lifestyle changes.  - BG not quite at goal, would consider metformin  xr 750mg  BID next month or time of next pcp visit if sugars worsen. Patient remains open to this if needed.   02/22/2024: - 7 day avg - 168. Mixed FBG and PPD 120s-170s - noted to be taking chromium supplement - no concerns noted. - had not started walking  04/05/2024 update: - reviewed A1c and FBG (150s or less) - reasonably controlled based off of last OV A1c 7.5% and success with focusing on intensive lifestyle changes.  - no changes noted following recent PCP visit, lipids and A1c at goal.  - reviewed medication adherence - no problems noted PDC 100% for ARB/DM Meds/Statin therapy - at this time recommending for patient to focus on her previous goal of walking a couple times per week, no Rx recommendations at this time.   Lang Sieve, PharmD, BCGP Clinical Pharmacist  [phone]

## 2024-04-29 ENCOUNTER — Other Ambulatory Visit: Payer: Self-pay | Admitting: Physician Assistant

## 2024-05-01 ENCOUNTER — Other Ambulatory Visit: Payer: Self-pay | Admitting: Family Medicine

## 2024-05-02 ENCOUNTER — Other Ambulatory Visit: Payer: Self-pay

## 2024-05-02 NOTE — Progress Notes (Signed)
 05/02/2024 Name: Mindy Ingram MRN: [id number] DOB: July 12, 1945  Chief Complaint  Patient presents with   Diabetes    Mindy Ingram is a 78 y.o. year old female who presented for a telephone visit.   They were referred to the pharmacist by their PCP for assistance in managing diabetes.    Subjective:  Care Team: Primary Care Provider: Milon Cleaves, [place] ; Next Scheduled Visit:  Future Appointments  Date Time Provider Department Center  06/07/2024 10:00 AM Pandora Cadet, [facility] None  07/12/2024 11:00 AM Milon Cleaves, PA [facility] [place]   Lab Results  Component Value Date   HGBA1C 7.5 (H) 03/29/2024   HGBA1C 7.3 (H) 12/24/2023   HGBA1C 7.6 (H) 09/01/2023   HGBA1C 6.6 (H) 01/29/2023   HGBA1C 7.6 (H) 06/10/2022   HGBA1C 7.4 (H) 02/28/2022   HGBA1C 7.2 (H) 11/19/2021   HGBA1C 6.9 (H) 08/06/2021   HGBA1C 6.8 (H) 04/08/2021   HGBA1C 6.8 (H) 12/25/2020   HGBA1C 6.7 (H) 09/20/2020     Medication Access/Adherence  Current Pharmacy:  [id number] - [place], [place] - [address] AT [address] [place] [address] Phone: [phone] Fax: [phone]  [facility] Delivery - [place], [place] - [address] [place]  [address] Phone: [phone] Fax: [phone]   Patient reports affordability concerns with their medications: No  Patient reports access/transportation concerns to their pharmacy: No  Patient reports adherence concerns with their medications:  No     Diabetes:  Current medications: metformin  500mg  BID, did not do well with 850mg  bid. Is okay with tyrying 750mg  bid if needed.    Patient denies hypoglycemic s/sx including dizziness, shakiness, sweating. Patient denies hyperglycemic symptoms including polyuria, polydipsia, polyphagia, nocturia, neuropathy, blurred vision.  Current meal patterns:  - Biggest issue is late night snacking, will have some popcorn, skinny dip.   - Ok with having almonds, peanuts, etc., as snack   Current physical activity: no formal exercise but will walk to mailbox, stays active in community. Is not sedentary.   Current medication access support: none required.   05/02/24 update: - Fbg generally 120-160 range, PPD <200.  - has had some health concerns related to family members so had not really established a wlaking routine, however this remains a goal for her.   Objective:  Lab Results  Component Value Date   HGBA1C 7.5 (H) 03/29/2024   Lab Results  Component Value Date   HGBA1C 7.5 (H) 03/29/2024   HGBA1C 7.3 (H) 12/24/2023   HGBA1C 7.6 (H) 09/01/2023   HGBA1C 6.6 (H) 01/29/2023   HGBA1C 7.6 (H) 06/10/2022   HGBA1C 7.4 (H) 02/28/2022   HGBA1C 7.2 (H) 11/19/2021   HGBA1C 6.9 (H) 08/06/2021   HGBA1C 6.8 (H) 04/08/2021   HGBA1C 6.8 (H) 12/25/2020   HGBA1C 6.7 (H) 09/20/2020     Lab Results  Component Value Date   CREATININE 0.94 03/29/2024   BUN 15 03/29/2024   NA 142 03/29/2024   K 3.9 03/29/2024   CL 101 03/29/2024   CO2 24 03/29/2024    Lab Results  Component Value Date   CHOL 111 03/29/2024   HDL 50 03/29/2024   LDLCALC 44 03/29/2024   TRIG 90 03/29/2024   CHOLHDL 2.2 03/29/2024    Medications Reviewed Today   Medications were not reviewed in this encounter       Assessment/Plan:   Diabetes: - Currently uncontrolled. - reviewed  A1c FBG (160s or less) - reasonably controlled based off of last OV A1c 7.5%  - Reviewed long term cardiovascular and renal outcomes of uncontrolled blood sugar - Reviewed goal A1c, goal fasting, and goal 2 hour post prandial glucose - Reviewed dietary modifications including: in attempt to to limit late night snacking, we agreed to change from 815pm to 7pm for cut off.  - Recommend to check glucose twice daily as is fasting and ppd dinner - discuss current and previous readings, now has good understanding of how to test properly and that snacks are going to  impact any PPD readings. . Maintains on metformin  500 mg BID, would be ok with increase if needed in the near future.    Lang Sieve, PharmD, BCGP Clinical Pharmacist  [phone]

## 2024-05-11 ENCOUNTER — Other Ambulatory Visit: Payer: Self-pay | Admitting: Family Medicine

## 2024-05-11 DIAGNOSIS — E1169 Type 2 diabetes mellitus with other specified complication: Secondary | ICD-10-CM

## 2024-05-24 ENCOUNTER — Telehealth: Payer: Self-pay | Admitting: Physician Assistant

## 2024-05-24 MED ORDER — ACCU-CHEK GUIDE W/DEVICE KIT: 1.0000 | PACK | Freq: Once | 0 refills | Status: AC

## 2024-05-24 NOTE — Telephone Encounter (Signed)
 Copied from CRM #[id number]. Topic: Clinical - Prescription Issue >> May 24, 2024  4:13 PM Wess RAMAN wrote: Reason for CRM: Pharmacy stated patient's insurance will not cover [facility] VERIO test strip. They would like a prescription for ACCU Check instead  Pharmacy: [id number] - [place], [place] - [address] [place] [address] Phone: [phone] Fax: [phone] Hours: Not open 24 hours

## 2024-05-24 NOTE — Telephone Encounter (Unsigned)
 Copied from CRM #[id number]. Topic: Clinical - Medication Question >> May 24, 2024  3:13 PM Tiffini S wrote: Reason for CRM: Patient called asking if the Contour test strip will fit into the Blood Glucose Monitoring Suppl (ONE TOUCH ULTRA 2) w/Device KIT- her insurance company would like for her to switch  She will take her monitor to the pharmacy to see if Contour strips will fit and if so which ones to request with pcp.   Please call the patient back at [phone] to discuss patient question.

## 2024-05-24 NOTE — Telephone Encounter (Signed)
 Called patient and left voicemail informing her that she will need to call her insurance to find out what glucometer is covered per her insurance and to call us  back if she needs a new glucometer called in and what kind and to what pharmacy.

## 2024-06-03 NOTE — Progress Notes (Signed)
   06/03/2024  Patient ID: Mindy Ingram, female   DOB: May 12, 1946, 78 y.o.   MRN: [id number]  Inbound VM from patient just wanting to give me a heads up about grant through [employer] expiring in march 2025 in advance of our visit next week.   Future Appointments  Date Time Provider Department Center  06/07/2024 10:00 AM Pandora Cadet, [facility] None  07/12/2024 11:00 AM Craft, [place], PA [facility] [place]

## 2024-06-07 ENCOUNTER — Other Ambulatory Visit: Payer: Self-pay

## 2024-06-07 DIAGNOSIS — E1169 Type 2 diabetes mellitus with other specified complication: Secondary | ICD-10-CM

## 2024-06-07 NOTE — Progress Notes (Signed)
 06/07/2024 Name: Mindy Ingram MRN: [id number] DOB: August 29, 1945  Chief Complaint  Patient presents with   Diabetes    Mindy Ingram is a 78 y.o. year old female who presented for a telephone visit.   They were referred to the pharmacist by their PCP for assistance in managing diabetes.    Subjective:  Care Team: Primary Care Provider: Milon Cleaves, [place] ; Next Scheduled Visit:  Future Appointments  Date Time Provider Department Center  07/12/2024 11:00 AM Milon Cleaves, [place] [facility] [place]  07/20/2024 10:00 AM [facility] [place]   Lab Results  Component Value Date   HGBA1C 7.5 (H) 03/29/2024   HGBA1C 7.3 (H) 12/24/2023   HGBA1C 7.6 (H) 09/01/2023   HGBA1C 6.6 (H) 01/29/2023   HGBA1C 7.6 (H) 06/10/2022   HGBA1C 7.4 (H) 02/28/2022   HGBA1C 7.2 (H) 11/19/2021   HGBA1C 6.9 (H) 08/06/2021   HGBA1C 6.8 (H) 04/08/2021   HGBA1C 6.8 (H) 12/25/2020   HGBA1C 6.7 (H) 09/20/2020     Medication Access/Adherence  Current Pharmacy:  [id number] - [place], [place] - [address] AT [address] [place] [address] Phone: [phone] Fax: [phone]  [facility] Delivery - [place], [place] - [address] [place] [address] Phone: [phone] Fax: [phone]   Patient reports affordability concerns with their medications: No  Patient reports access/transportation concerns to their pharmacy: No  Patient reports adherence concerns with their medications:  No     Diabetes:  Current medications: metformin  500mg  BID, did not do well with 850mg  bid. Is okay with tyrying 750mg  bid if needed.    Patient denies hypoglycemic s/sx including dizziness, shakiness, sweating. Patient denies hyperglycemic symptoms including polyuria, polydipsia, polyphagia, nocturia, neuropathy, blurred vision.  Current meal patterns:  - Biggest issue is late night snacking, will have some popcorn, skinny  dip.  - Ok with having almonds, peanuts, etc., as snack   Current physical activity: no formal exercise but will walk to mailbox, stays active in community. Is not sedentary.   Current medication access support: none required.   05/02/24: - Fbg generally 120-160 range, PPD <200.  - has had some health concerns related to family members so had not really established a wlaking routine, however this remains a goal for her.   06/07/24 update: - 54 y.o. cousin had passed 1 yr ago 07/28/23, making things difficult for patient recently   - dealing with constipation/gas - continues to be active/not sedentary, will consider going to [facility] with Grandson and start walking     Objective:  Lab Results  Component Value Date   HGBA1C 7.5 (H) 03/29/2024   Lab Results  Component Value Date   HGBA1C 7.5 (H) 03/29/2024   HGBA1C 7.3 (H) 12/24/2023   HGBA1C 7.6 (H) 09/01/2023   HGBA1C 6.6 (H) 01/29/2023   HGBA1C 7.6 (H) 06/10/2022   HGBA1C 7.4 (H) 02/28/2022   HGBA1C 7.2 (H) 11/19/2021   HGBA1C 6.9 (H) 08/06/2021   HGBA1C 6.8 (H) 04/08/2021   HGBA1C 6.8 (H) 12/25/2020   HGBA1C 6.7 (H) 09/20/2020     Lab Results  Component Value Date   CREATININE 0.94 03/29/2024   BUN 15 03/29/2024   NA 142 03/29/2024   K 3.9 03/29/2024   CL 101 03/29/2024   CO2 24 03/29/2024    Lab Results  Component Value Date   CHOL 111 03/29/2024   HDL 50 03/29/2024   LDLCALC  44 03/29/2024   TRIG 90 03/29/2024   CHOLHDL 2.2 03/29/2024    Assessment/Plan:   Diabetes: - Currently uncontrolled. - reviewed A1c FBG (160s or less) - reasonably controlled based off of last OV A1c 7.5%  - Reviewed long term cardiovascular and renal outcomes of uncontrolled blood sugar - Reviewed goal A1c, goal fasting, and goal 2 hour post prandial glucose - Recommend to check glucose twice daily as is fasting and ppd dinner - Maintains on metformin  500 mg BID, would be ok with increase if needed in the near future.  - reviewed  dietary changes related to DM and constipation  - will need to reenroll in [employer] grant next month, visit scheduled    Lang Sieve, PharmD, BCGP Clinical Pharmacist  [phone]

## 2024-06-24 ENCOUNTER — Other Ambulatory Visit: Payer: Self-pay | Admitting: Physician Assistant

## 2024-06-24 DIAGNOSIS — Z1231 Encounter for screening mammogram for malignant neoplasm of breast: Secondary | ICD-10-CM

## 2024-07-03 ENCOUNTER — Other Ambulatory Visit: Payer: Self-pay | Admitting: Family Medicine

## 2024-07-03 DIAGNOSIS — E1169 Type 2 diabetes mellitus with other specified complication: Secondary | ICD-10-CM

## 2024-07-06 ENCOUNTER — Other Ambulatory Visit: Payer: Self-pay

## 2024-07-06 DIAGNOSIS — I152 Hypertension secondary to endocrine disorders: Secondary | ICD-10-CM

## 2024-07-06 MED ORDER — VALSARTAN-HYDROCHLOROTHIAZIDE 80-12.5 MG PO TABS: 1.0000 | ORAL_TABLET | Freq: Every day | ORAL | 3 refills | Status: AC

## 2024-07-12 ENCOUNTER — Encounter: Payer: Self-pay | Admitting: Physician Assistant

## 2024-07-12 ENCOUNTER — Ambulatory Visit: Admitting: Physician Assistant

## 2024-07-12 VITALS — BP 140/64 | HR 80 | Temp 97.8°F | Resp 16 | Ht 59.5 in | Wt 139.0 lb

## 2024-07-12 DIAGNOSIS — E1169 Type 2 diabetes mellitus with other specified complication: Secondary | ICD-10-CM

## 2024-07-12 DIAGNOSIS — M25562 Pain in left knee: Secondary | ICD-10-CM | POA: Diagnosis not present

## 2024-07-12 DIAGNOSIS — E1159 Type 2 diabetes mellitus with other circulatory complications: Secondary | ICD-10-CM

## 2024-07-12 DIAGNOSIS — G8929 Other chronic pain: Secondary | ICD-10-CM

## 2024-07-12 DIAGNOSIS — E782 Mixed hyperlipidemia: Secondary | ICD-10-CM

## 2024-07-12 DIAGNOSIS — M25561 Pain in right knee: Secondary | ICD-10-CM

## 2024-07-12 DIAGNOSIS — L219 Seborrheic dermatitis, unspecified: Secondary | ICD-10-CM

## 2024-07-12 DIAGNOSIS — I152 Hypertension secondary to endocrine disorders: Secondary | ICD-10-CM

## 2024-07-12 DIAGNOSIS — Z7984 Long term (current) use of oral hypoglycemic drugs: Secondary | ICD-10-CM

## 2024-07-12 MED ORDER — DICLOFENAC SODIUM 1 % EX GEL: 4.0000 g | Freq: Four times a day (QID) | CUTANEOUS | 2 refills | Status: DC | PRN

## 2024-07-12 NOTE — Assessment & Plan Note (Addendum)
 Well controlled.  Continue to work on eating a healthy diet and exercise.  Labs drawn today.   No major side effects reported, and no issues with compliance. The current medical regimen is effective;  continue present plan with Metoprolol  200mg , Diovan  80-12.5mg  Will adjust medication as needed depending on labs BP Readings from Last 3 Encounters:  07/12/24 (!) 140/64  03/29/24 118/72  03/01/24 136/82   Orders:   CBC with Differential/Platelet   Comprehensive metabolic panel with GFR

## 2024-07-12 NOTE — Assessment & Plan Note (Addendum)
 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 Weight decreased from 142 lbs to 139 lbs. Discussed potential long-term side effects of metformin  and possible dosage increase if blood sugar levels rise. - Continue metformin  as prescribed. - Monitor blood sugar levels. - Consider increasing metformin  to 750 mg extended release if blood sugar levels rise significantly. Lab Results  Component Value Date   HGBA1C 7.0 (H) 07/12/2024   HGBA1C 7.5 (H) 03/29/2024   HGBA1C 7.3 (H) 12/24/2023    Orders:   Hemoglobin A1c   Microalbumin / creatinine urine ratio

## 2024-07-12 NOTE — Progress Notes (Unsigned)
 "  Subjective:  Patient ID: Mindy Ingram, female    DOB: [birth year]  Age: 79 y.o. MRN: [id number]  No chief complaint on file.   HPI: Discussed the use of AI scribe software for clinical note transcription with the patient, who gave verbal consent to proceed.  History of Present Illness           08/06/2023   10:51 AM 02/03/2023   11:26 AM 05/26/2022    1:02 PM 02/28/2022    9:20 AM 08/06/2021   10:12 AM  Depression screen PHQ 2/9  Decreased Interest 0 0 0 0 0  Down, Depressed, Hopeless 1 0 0 0 0  PHQ - 2 Score 1 0 0 0 0  Altered sleeping 0 0     Tired, decreased energy 0 0     Change in appetite 0 0     Feeling bad or failure about yourself  0 0     Trouble concentrating 0 0     Moving slowly or fidgety/restless 0 0     Suicidal thoughts 0 0     PHQ-9 Score 1  0      Difficult doing work/chores Not difficult at all Somewhat difficult        Data saved with a previous flowsheet row definition        11/19/2023    9:30 AM  Fall Risk   Falls in the past year? 0    Patient Care Team: Milon Cleaves, [place] as PCP - General (Physician Assistant) Towana Charleston, MD (Gastroenterology) Mai Lynwood FALCON, MD as Consulting Physician (Rheumatology) Myrick Elspeth PARAS., DPM (Podiatry) Lavonia Lye, MD as Consulting Physician (Ophthalmology) Laurice Grice, OD (Optometry) Neysa Reggy BIRCH, MD as Consulting Physician (Pulmonary Disease)   Review of Systems  Constitutional:  Negative for chills, fatigue and fever.  HENT:  Negative for congestion, ear pain and sore throat.   Respiratory:  Negative for cough and shortness of breath.   Cardiovascular:  Negative for chest pain and palpitations.  Gastrointestinal:  Negative for abdominal pain, constipation, diarrhea, nausea and vomiting.  Endocrine: Negative for polydipsia, polyphagia and polyuria.  Genitourinary:  Negative for difficulty urinating and dysuria.  Musculoskeletal:  Negative for arthralgias, back pain and myalgias.   Skin:  Negative for rash.  Neurological:  Negative for headaches.  Psychiatric/Behavioral:  Negative for dysphoric mood. The patient is not nervous/anxious.     Medications Ordered Prior to Encounter[1] Past Medical History:  Diagnosis Date   Acute URI 02/09/2023   Allergic asthma, mild intermittent, uncomplicated 10/17/2007   Office Spirometry 04/03/2015- WNL FVC 2.14/110%, FEV1 1.94/128%, FEV1/FVC 0.91  Office spirometry- 11/04/16--by allergy  office- FEV1/FVC 0.77 WNL   Bilateral impacted cerumen 07/01/2018   BMI 29.0-29.9,adult 06/10/2022   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   Cervical radicular pain 01/20/2022   Chronic back pain    Chronic pain of both knees 02/07/2023   Contusion of head, initial encounter 03/18/2021   Corn of toe 06/25/2020   Cough, persistent 09/15/2023   Degeneration of lumbosacral intervertebral disc 01/26/2012   DJD (degenerative joint disease)    DM type 2 with diabetic mixed hyperlipidemia (HCC) 04/08/2021   Dyslipidemia 01/23/2015   Essential hypertension 01/23/2015   Essential hypertension, benign 01/23/2015   Fibromyalgia    GERD 10/17/2007   Qualifier: Diagnosis of  By: Neysa MD, Clinton D    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   History of influenza 09/15/2023   Hyperlipidemia 06/25/2020   Hypertension  associated with diabetes (HCC) 01/23/2015   Influenza A virus present 08/25/2023   Lab test positive for detection of COVID-19 virus 02/09/2023   Lumbar facet arthropathy 01/20/2022   Lumbar spondylolysis 02/10/2012   Menopause 06/25/2020   Metatarsalgia of both feet 09/18/2016   Multiple benign melanocytic nevi 06/25/2020   Myalgia 03/03/2022   Myalgia due to statin 03/03/2022   Neck pain 12/12/2021   Need for hepatitis C screening test 02/08/2023   Obesity 07/10/2021   Obstructive sleep apnea syndrome    NPSG 11/12/10- AHI  17.9/ hr, desat to 76% titrated to CPAP 12, body weight 165 lbs     Using CPAP every night     OSA  (obstructive sleep apnea)    cpap setting of 10   Pain 03/24/2018   Preop cardiovascular exam 06/10/2022   Right rotator cuff tear 06/25/2020   Right temporal frontal scalp contusions 03/18/2021   S/P lumbar fusion 03/16/2012   Screening for colorectal cancer 12/24/2023   Seasonal and perennial allergic rhinitis 10/17/2007   Allergy  vaccine 2002- dc'd   Shingles    Shoulder joint pain 06/25/2020   Sore throat 08/25/2023   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   Statin myopathy 06/10/2022   Thoracic spine pain 02/23/2013   Type II or unspecified type diabetes mellitus without mention of complication, not stated as uncontrolled    Past Surgical History:  Procedure Laterality Date   APPENDECTOMY  1995   BACK SURGERY  2013   lower back with plates and screws   benign lump right axilla     BILATERAL SALPINGOOPHORECTOMY  1995   CERVICAL SPINE SURGERY  1992   at [facility], slight limitation with turning neck   CHOLECYSTECTOMY  1995   EUS N/A 07/21/2013   Procedure: UPPER ENDOSCOPIC ULTRASOUND (EUS) LINEAR;  Surgeon: Toribio SHAUNNA Cedar, MD;  Location: [facility];  Service: Endoscopy;  Laterality: N/A;   FOOT SURGERY     2007 - right great toe, 2000 - right fifth digit .   NEUROPLASTY / TRANSPOSITION MEDIAN NERVE AT CARPAL TUNNEL BILATERAL  1995   PARTIAL COLECTOMY  1995   benign adhesions   PARTIAL HYSTERECTOMY  1980s.   abdominal   right foot surgery  2000   right great toe with artificial bone inserted   right knee arthroscopy Right 02/2017    Family History  Problem Relation Age of Onset   Heart disease Mother    Prostate cancer Father    Heart attack Sister    Breast cancer Maternal Aunt    Prostate cancer Paternal Uncle    Allergies Neg Hx    Asthma Neg Hx    Eczema Neg Hx    Immunodeficiency Neg Hx    Social History   Socioeconomic History   Marital status: Widowed    Spouse name: Not on file   Number of children: 1   Years of  education: Not on file   Highest education level: Not on file  Occupational History   Occupation: Retired  Tobacco Use   Smoking status: Never   Smokeless tobacco: Never  Vaping Use   Vaping status: Never Used  Substance and Sexual Activity   Alcohol use: No   Drug use: No   Sexual activity: Not on file  Other Topics Concern   Not on file  Social History Narrative   Not on file   Social Drivers of Health   Tobacco Use: Low Risk (03/29/2024)   Patient  History    Smoking Tobacco Use: Never    Smokeless Tobacco Use: Never    Passive Exposure: Not on file  Financial Resource Strain: Low Risk (08/06/2023)   Overall Financial Resource Strain (CARDIA)    Difficulty of Paying Living Expenses: Not very hard  Food Insecurity: No Food Insecurity (08/06/2023)   Hunger Vital Sign    Worried About Running Out of Food in the Last Year: Never true    Ran Out of Food in the Last Year: Never true  Transportation Needs: No Transportation Needs (08/06/2023)   PRAPARE - Administrator, Civil Service (Medical): No    Lack of Transportation (Non-Medical): No  Physical Activity: Insufficiently Active (08/06/2023)   Exercise Vital Sign    Days of Exercise per Week: 2 days    Minutes of Exercise per Session: 40 min  Stress: No Stress Concern Present (08/06/2023)   [employer] of Occupational Health - Occupational Stress Questionnaire    Feeling of Stress : Not at all  Social Connections: Moderately Isolated (08/06/2023)   Social Connection and Isolation Panel    Frequency of Communication with Friends and Family: More than three times a week    Frequency of Social Gatherings with Friends and Family: More than three times a week    Attends Religious Services: More than 4 times per year    Active Member of [employer] or Organizations: No    Attends Banker Meetings: Never    Marital Status: Widowed  Depression (PHQ2-9): Low Risk (08/06/2023)   Depression (PHQ2-9)    PHQ-2 Score: 1   Alcohol Screen: Low Risk (08/06/2023)   Alcohol Screen    Last Alcohol Screening Score (AUDIT): 0  Housing: Low Risk (08/06/2023)   Housing Stability Vital Sign    Unable to Pay for Housing in the Last Year: No    Number of Times Moved in the Last Year: 0    Homeless in the Last Year: No  Utilities: Not At Risk (08/06/2023)   AHC Utilities    Threatened with loss of utilities: No  Health Literacy: Adequate Health Literacy (08/06/2023)   B1300 Health Literacy    Frequency of need for help with medical instructions: Never    Objective:  There were no vitals taken for this visit.     03/29/2024   10:05 AM 03/01/2024   10:42 AM 12/24/2023   10:32 AM  BP/Weight  Systolic BP 118 136 110  Diastolic BP 72 82 70  Wt. (Lbs) 137.8 138.6 137.5  BMI 27.37 kg/m2 27.43 kg/m2 27.31 kg/m2    Physical Exam Vitals reviewed.  Constitutional:      Appearance: Normal appearance.  Cardiovascular:     Rate and Rhythm: Normal rate and regular rhythm.     Heart sounds: Normal heart sounds.  Pulmonary:     Effort: Pulmonary effort is normal.     Breath sounds: Normal breath sounds.  Abdominal:     General: Bowel sounds are normal.     Palpations: Abdomen is soft.     Tenderness: There is no abdominal tenderness.  Neurological:     Mental Status: She is alert and oriented to person, place, and time.  Psychiatric:        Mood and Affect: Mood normal.        Behavior: Behavior normal.     {Perform Simple Foot Exam  Perform Detailed exam:1} {Insert foot Exam (Optional):30965}   Lab Results  Component Value Date   WBC  4.3 03/29/2024   HGB 13.2 03/29/2024   HCT 41.0 03/29/2024   PLT 299 03/29/2024   GLUCOSE 125 (H) 03/29/2024   CHOL 111 03/29/2024   TRIG 90 03/29/2024   HDL 50 03/29/2024   LDLCALC 44 03/29/2024   ALT 12 03/29/2024   AST 22 03/29/2024   NA 142 03/29/2024   K 3.9 03/29/2024   CL 101 03/29/2024   CREATININE 0.94 03/29/2024   BUN 15 03/29/2024   CO2 24 03/29/2024   TSH  0.693 12/24/2023   INR 0.9 06/10/2022   HGBA1C 7.5 (H) 03/29/2024    Results for orders placed or performed in visit on 03/29/24  CBC with Differential/Platelet   Collection Time: 03/29/24 11:06 AM  Result Value Ref Range   WBC 4.3 3.4 - 10.8 x10E3/uL   RBC 4.27 3.77 - 5.28 x10E6/uL   Hemoglobin 13.2 11.1 - 15.9 g/dL   Hematocrit 58.9 65.9 - 46.6 %   MCV 96 79 - 97 fL   MCH 30.9 26.6 - 33.0 pg   MCHC 32.2 31.5 - 35.7 g/dL   RDW 86.4 88.2 - 84.5 %   Platelets 299 150 - 450 x10E3/uL   Neutrophils 42 Not Estab. %   Lymphs 46 Not Estab. %   Monocytes 9 Not Estab. %   Eos 2 Not Estab. %   Basos 1 Not Estab. %   Neutrophils Absolute 1.8 1.4 - 7.0 x10E3/uL   Lymphocytes Absolute 2.1 0.7 - 3.1 x10E3/uL   Monocytes Absolute 0.4 0.1 - 0.9 x10E3/uL   EOS (ABSOLUTE) 0.1 0.0 - 0.4 x10E3/uL   Basophils Absolute 0.0 0.0 - 0.2 x10E3/uL   Immature Granulocytes 0 Not Estab. %   Immature Grans (Abs) 0.0 0.0 - 0.1 x10E3/uL  Comprehensive metabolic panel with GFR   Collection Time: 03/29/24 11:06 AM  Result Value Ref Range   Glucose 125 (H) 70 - 99 mg/dL   BUN 15 8 - 27 mg/dL   Creatinine, Ser 9.05 0.57 - 1.00 mg/dL   eGFR 62 >40 fO/fpw/8.26   BUN/Creatinine Ratio 16 12 - 28   Sodium 142 134 - 144 mmol/L   Potassium 3.9 3.5 - 5.2 mmol/L   Chloride 101 96 - 106 mmol/L   CO2 24 20 - 29 mmol/L   Calcium  10.0 8.7 - 10.3 mg/dL   Total Protein 7.3 6.0 - 8.5 g/dL   Albumin 4.6 3.8 - 4.8 g/dL   Globulin, Total 2.7 1.5 - 4.5 g/dL   Bilirubin Total 0.3 0.0 - 1.2 mg/dL   Alkaline Phosphatase 46 (L) 49 - 135 IU/L   AST 22 0 - 40 IU/L   ALT 12 0 - 32 IU/L  Hemoglobin A1c   Collection Time: 03/29/24 11:06 AM  Result Value Ref Range   Hgb A1c MFr Bld 7.5 (H) 4.8 - 5.6 %   Est. average glucose Bld gHb Est-mCnc 169 mg/dL  Lipid panel   Collection Time: 03/29/24 11:06 AM  Result Value Ref Range   Cholesterol, Total 111 100 - 199 mg/dL   Triglycerides 90 0 - 149 mg/dL   HDL 50 >60 mg/dL   VLDL  Cholesterol Cal 17 5 - 40 mg/dL   LDL Chol Calc (NIH) 44 0 - 99 mg/dL   Chol/HDL Ratio 2.2 0.0 - 4.4 ratio  .  Assessment & Plan:   Assessment & Plan Hypertension associated with diabetes (HCC)  Orders:   CBC with Differential/Platelet   Comprehensive metabolic panel with GFR  DM type 2 with diabetic  mixed hyperlipidemia (HCC)  Orders:   Hemoglobin A1c   Microalbumin / creatinine urine ratio  Mixed hyperlipidemia  Orders:   Lipid panel    There is no height or weight on file to calculate BMI.  Assessment and Plan Assessment & Plan       No orders of the defined types were placed in this encounter.   No orders of the defined types were placed in this encounter.      Follow-up: No follow-ups on file.  An After Visit Summary was printed and given to the patient.  Nola Angles, [place] [facility] [phone]     [1]  Current Outpatient Medications on File Prior to Visit  Medication Sig Dispense Refill   acyclovir  (ZOVIRAX ) 400 MG tablet Take 1 tablet (400 mg total) by mouth 2 (two) times daily. (Patient taking differently: Take 400 mg by mouth as needed (rash outbreak).) 60 tablet 3   aspirin EC 81 MG tablet Take 81 mg by mouth daily.     azelastine  (ASTELIN ) 0.1 % nasal spray Place 1 spray into both nostrils 2 (two) times daily as needed for allergies or rhinitis.     cetirizine  (ZYRTEC ) 10 MG tablet Take 1 tablet (10 mg total) by mouth daily as needed for allergies. 90 tablet 3   Chlorophyll 10 MG TABS Take 1 tablet by mouth daily.     Cholecalciferol (VITAMIN D PO) Take 2,000 Units by mouth daily.      CRESTOR  20 MG tablet Take 1 tablet (20 mg total) by mouth daily. [facility] billed secondary to Medicare to cover copay: ID [id number] BIN [id number] PCN [id number] Group [id number] 90 tablet 3   diclofenac  Sodium (VOLTAREN ) 1 % GEL Apply 4 g topically 4 (four) times daily as needed (joint pain). 350 g 2   Elderberry 575 MG/5ML SYRP Take 5 mLs by mouth  daily.     fluticasone  (FLONASE ) 50 MCG/ACT nasal spray USE 1 SPRAY IN EACH NOSTRIL EVERY DAY 48 g 1   hydroxypropyl methylcellulose / hypromellose (ISOPTO TEARS / GONIOVISC) 2.5 % ophthalmic solution Place 1 drop into both eyes daily.     Lancets (ONETOUCH DELICA PLUS LANCET33G) MISC TEST BLOOD SUGAR TWICE DAILY MORNING AND EVENING 200 each 1   metFORMIN  (GLUCOPHAGE -XR) 500 MG 24 hr tablet TAKE 1 TABLET BY MOUTH TWICE  DAILY 200 tablet 2   metoprolol  (TOPROL -XL) 200 MG 24 hr tablet TAKE 1 TABLET(200 MG) BY MOUTH DAILY 90 tablet 1   Multiple Vitamins-Minerals (COMPLETE ENERGY) TABS Take 1 tablet by mouth daily.     nitroGLYCERIN  (NITROSTAT ) 0.4 MG SL tablet Place 1 tablet (0.4 mg total) under the tongue every 5 (five) minutes as needed for chest pain. 30 tablet 1   Omega-3 Fatty Acids (FISH OIL) 1000 MG CAPS Take 1 capsule by mouth daily.     omeprazole  (PRILOSEC) 20 MG capsule TAKE 1 CAPSULE BY MOUTH DAILY 100 capsule 2   ONETOUCH VERIO test strip USE TO CHECK BLOOD SUGAR THREE TIMES DAILY BEFORE MEALS 300 strip 3   traMADol  (ULTRAM ) 50 MG tablet Take 50 mg by mouth 2 (two) times daily as needed for pain.     triamcinolone  ointment (KENALOG ) 0.1 % Apply 1 application topically 2 (two) times daily. (Patient taking differently: Apply 1 application  topically as needed (rash).) 80 g 0   valsartan -hydrochlorothiazide  (DIOVAN -HCT) 80-12.5 MG tablet Take 1 tablet by mouth daily. 90 tablet 3   No current facility-administered medications on file prior to  visit.   "

## 2024-07-12 NOTE — Assessment & Plan Note (Addendum)
 Orders:    Lipid panel

## 2024-07-13 LAB — CBC WITH DIFFERENTIAL/PLATELET
Basophils Absolute: 0 x10E3/uL (ref 0.0–0.2)
Basos: 1 %
EOS (ABSOLUTE): 0.1 x10E3/uL (ref 0.0–0.4)
Eos: 2 %
Hematocrit: 41 % (ref 34.0–46.6)
Hemoglobin: 12.9 g/dL (ref 11.1–15.9)
Immature Grans (Abs): 0 x10E3/uL (ref 0.0–0.1)
Immature Granulocytes: 0 %
Lymphocytes Absolute: 2 x10E3/uL (ref 0.7–3.1)
Lymphs: 49 %
MCH: 30.4 pg (ref 26.6–33.0)
MCHC: 31.5 g/dL (ref 31.5–35.7)
MCV: 97 fL (ref 79–97)
Monocytes Absolute: 0.3 x10E3/uL (ref 0.1–0.9)
Monocytes: 8 %
Neutrophils Absolute: 1.6 x10E3/uL (ref 1.4–7.0)
Neutrophils: 40 %
Platelets: 307 x10E3/uL (ref 150–450)
RBC: 4.24 x10E6/uL (ref 3.77–5.28)
RDW: 12.5 % (ref 11.7–15.4)
WBC: 4.1 x10E3/uL (ref 3.4–10.8)

## 2024-07-13 LAB — COMPREHENSIVE METABOLIC PANEL WITH GFR
ALT: 10 IU/L (ref 0–32)
AST: 19 IU/L (ref 0–40)
Albumin: 4.5 g/dL (ref 3.8–4.8)
Alkaline Phosphatase: 50 IU/L (ref 49–135)
BUN/Creatinine Ratio: 17 (ref 12–28)
BUN: 13 mg/dL (ref 8–27)
Bilirubin Total: 0.3 mg/dL (ref 0.0–1.2)
CO2: 23 mmol/L (ref 20–29)
Calcium: 9.8 mg/dL (ref 8.7–10.3)
Chloride: 103 mmol/L (ref 96–106)
Creatinine, Ser: 0.78 mg/dL (ref 0.57–1.00)
Globulin, Total: 2.9 g/dL (ref 1.5–4.5)
Glucose: 112 mg/dL — ABNORMAL HIGH (ref 70–99)
Potassium: 3.9 mmol/L (ref 3.5–5.2)
Sodium: 145 mmol/L — ABNORMAL HIGH (ref 134–144)
Total Protein: 7.4 g/dL (ref 6.0–8.5)
eGFR: 78 mL/min/1.73

## 2024-07-13 LAB — MICROALBUMIN / CREATININE URINE RATIO
Creatinine, Urine: 87.6 mg/dL
Microalb/Creat Ratio: 10 mg/g{creat} (ref 0–29)
Microalbumin, Urine: 8.8 ug/mL

## 2024-07-13 LAB — LIPID PANEL
Chol/HDL Ratio: 3.3 ratio (ref 0.0–4.4)
Cholesterol, Total: 199 mg/dL (ref 100–199)
HDL: 60 mg/dL
LDL Chol Calc (NIH): 123 mg/dL — ABNORMAL HIGH (ref 0–99)
Triglycerides: 89 mg/dL (ref 0–149)
VLDL Cholesterol Cal: 16 mg/dL (ref 5–40)

## 2024-07-13 LAB — HEMOGLOBIN A1C
Est. average glucose Bld gHb Est-mCnc: 154 mg/dL
Hgb A1c MFr Bld: 7 % — ABNORMAL HIGH (ref 4.8–5.6)

## 2024-07-14 ENCOUNTER — Ambulatory Visit: Payer: Self-pay | Admitting: Physician Assistant

## 2024-07-14 NOTE — Assessment & Plan Note (Signed)
 Chronic bilateral knee pain Managed with Voltaren  gel with good effect. No recent exacerbations reported. - Continue Voltaren  gel as needed for knee pain. Orders:   diclofenac  Sodium (VOLTAREN ) 1 % GEL; Apply 4 g topically 4 (four) times daily as needed (joint pain).

## 2024-07-14 NOTE — Assessment & Plan Note (Signed)
 Seborrheic dermatitis Managed with acyclovir  as needed. No recent outbreaks reported. Previous misdiagnosis of shingles corrected. - Continue acyclovir  as needed for outbreaks.

## 2024-07-15 ENCOUNTER — Ambulatory Visit
Admission: RE | Admit: 2024-07-15 | Discharge: 2024-07-15 | Disposition: A | Discharge status: Home / Self Care | Source: Ambulatory Visit | Attending: Physician Assistant | Admitting: Physician Assistant

## 2024-07-15 DIAGNOSIS — Z1231 Encounter for screening mammogram for malignant neoplasm of breast: Secondary | ICD-10-CM

## 2024-07-20 ENCOUNTER — Ambulatory Visit: Payer: Self-pay | Admitting: Physician Assistant

## 2024-07-20 ENCOUNTER — Ambulatory Visit

## 2024-07-20 ENCOUNTER — Telehealth: Payer: Self-pay

## 2024-07-20 NOTE — Progress Notes (Signed)
" ° °  07/20/2024  Patient ID: Mindy Ingram, female   DOB: 04-15-1946, 79 y.o.   MRN: [id number]  Received call from patient - requesting RS OV with pharmacist d/t realizing time of appt was 10am last minute.   RS to next week.   Future Appointments  Date Time Provider Department Center  07/27/2024 10:00 AM [facility]  11/15/2024 11:00 AM Craft, Nola, PA [facility]    Lang Sieve, PharmD, BCGP Clinical Pharmacist  [phone]

## 2024-07-27 ENCOUNTER — Ambulatory Visit

## 2024-07-28 ENCOUNTER — Ambulatory Visit

## 2024-07-28 VITALS — BP 130/76 | HR 99 | Temp 97.5°F | Ht 59.5 in | Wt 140.3 lb

## 2024-07-28 DIAGNOSIS — M5030 Other cervical disc degeneration, unspecified cervical region: Secondary | ICD-10-CM

## 2024-07-28 DIAGNOSIS — R202 Paresthesia of skin: Secondary | ICD-10-CM

## 2024-07-28 DIAGNOSIS — M5137 Other intervertebral disc degeneration, lumbosacral region with discogenic back pain only: Secondary | ICD-10-CM | POA: Diagnosis not present

## 2024-07-28 NOTE — Assessment & Plan Note (Signed)
 Could be due to radiculopathy from degenerative cervical and lumbar disc disease. Advised to try biofreeze or salon pas for numbing/ counter irritant effect. Report back if any worsening symptoms or call 911 for any severe symptoms

## 2024-07-28 NOTE — Assessment & Plan Note (Signed)
 Has chronic low back pain, radiculopathy. Hesitant to take any medications at this time. States she has not taken tramadol  in nearly 3 years. Does not want to be on any muscle relaxants either. Was not keen on gabapentin. States she just wanted reassurance that this was not stroke. Explained to her that since her symptoms are not unilateral, and the only symptoms of tingling was intermittent, with a normal exam today, it is reassuring. But if any severe symptoms, she should go to the ED.

## 2024-07-28 NOTE — Progress Notes (Signed)
 "  Acute Office Visit  Subjective:    Patient ID: Mindy Ingram, female    DOB: February 17, 1946, 79 y.o.   MRN: [id number]  Chief Complaint  Patient presents with   Tingling    HPI: Patient is in today for a problem visit.   Discussed the use of AI scribe software for clinical note transcription with the patient, who gave verbal consent to proceed.  History of Present Illness   Mindy Ingram is a 79 year old female with a history of neck surgery and arthritis who presents with tingling sensations in the neck, waist, and limbs.  Paresthesia and sensory disturbance - Tingling sensations present in the neck, waist, legs, and arms for approximately two weeks - Sensations described as 'sting or burn' that are transient and intermittent - Initial onset in the neck around the shoulders, predominantly on the left side - Tingling occurs with deep breaths - No associated pain, only abnormal sensation - No issues with sleep  Musculoskeletal symptoms and history - History of major neck surgery with removal of degenerated discs and placement of brunescent plates in the back - MRI in June 2023 showed fusion of neck vertebrae from C4 to C6 and arthritis from C3 to T11 - No pain along the spine or significant pain in the low back, except for slight tenderness in one area - Muscle tenderness present on both sides of the neck without pain on pressure  Shoulder injury and sequelae - Fall nearly two years ago resulting in shoulder surgery - Persistent discoloration and tenderness in the shoulder area since the fall - Discoloration described as 'purplish' and area is tender to touch  Pain management and medications - No use of tramadol  for pain in the past three years - Uses Voltaren  gel for pain management - No use of gabapentin or other medications for tingling sensation         Past Medical History:  Diagnosis Date   Acute URI 02/09/2023   Allergic asthma, mild intermittent,  uncomplicated 10/17/2007   Office Spirometry 04/03/2015- WNL FVC 2.14/110%, FEV1 1.94/128%, FEV1/FVC 0.91  Office spirometry- 11/04/16--by allergy  office- FEV1/FVC 0.77 WNL   Bilateral impacted cerumen 07/01/2018   BMI 29.0-29.9,adult 06/10/2022   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   Cervical radicular pain 01/20/2022   Chronic back pain    Chronic pain of both knees 02/07/2023   Contusion of head, initial encounter 03/18/2021   Corn of toe 06/25/2020   Cough, persistent 09/15/2023   Degeneration of lumbosacral intervertebral disc 01/26/2012   DJD (degenerative joint disease)    DM type 2 with diabetic mixed hyperlipidemia (HCC) 04/08/2021   Dyslipidemia 01/23/2015   Essential hypertension 01/23/2015   Essential hypertension, benign 01/23/2015   Fibromyalgia    GERD 10/17/2007   Qualifier: Diagnosis of  By: Neysa MD, Clinton D    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   History of influenza 09/15/2023   Hyperlipidemia 06/25/2020   Hypertension associated with diabetes (HCC) 01/23/2015   Influenza A virus present 08/25/2023   Lab test positive for detection of COVID-19 virus 02/09/2023   Lumbar facet arthropathy 01/20/2022   Lumbar spondylolysis 02/10/2012   Menopause 06/25/2020   Metatarsalgia of both feet 09/18/2016   Multiple benign melanocytic nevi 06/25/2020   Myalgia 03/03/2022   Myalgia due to statin 03/03/2022   Neck pain 12/12/2021   Need for hepatitis C screening test 02/08/2023   Obesity 07/10/2021   Obstructive sleep apnea syndrome  NPSG 11/12/10- AHI  17.9/ hr, desat to 76% titrated to CPAP 12, body weight 165 lbs     Using CPAP every night     OSA (obstructive sleep apnea)    cpap setting of 10   Pain 03/24/2018   Preop cardiovascular exam 06/10/2022   Right rotator cuff tear 06/25/2020   Right temporal frontal scalp contusions 03/18/2021   S/P lumbar fusion 03/16/2012   Screening for colorectal cancer 12/24/2023   Seasonal and perennial allergic  rhinitis 10/17/2007   Allergy  vaccine 2002- dc'd   Shingles    Shoulder joint pain 06/25/2020   Sore throat 08/25/2023   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   Statin myopathy 06/10/2022   Thoracic spine pain 02/23/2013   Type II or unspecified type diabetes mellitus without mention of complication, not stated as uncontrolled     Past Surgical History:  Procedure Laterality Date   APPENDECTOMY  1995   BACK SURGERY  2013   lower back with plates and screws   benign lump right axilla     BILATERAL SALPINGOOPHORECTOMY  1995   CERVICAL SPINE SURGERY  1992   at [facility], slight limitation with turning neck   CHOLECYSTECTOMY  1995   EUS N/A 07/21/2013   Procedure: UPPER ENDOSCOPIC ULTRASOUND (EUS) LINEAR;  Surgeon: Toribio SHAUNNA Cedar, MD;  Location: [facility];  Service: Endoscopy;  Laterality: N/A;   FOOT SURGERY     2007 - right great toe, 2000 - right fifth digit .   NEUROPLASTY / TRANSPOSITION MEDIAN NERVE AT CARPAL TUNNEL BILATERAL  1995   PARTIAL COLECTOMY  1995   benign adhesions   PARTIAL HYSTERECTOMY  1980s.   abdominal   right foot surgery  2000   right great toe with artificial bone inserted   right knee arthroscopy Right 02/2017    Family History  Problem Relation Age of Onset   Heart disease Mother    Prostate cancer Father    Heart attack Sister    Breast cancer Maternal Aunt    Prostate cancer Paternal Uncle    Allergies Neg Hx    Asthma Neg Hx    Eczema Neg Hx    Immunodeficiency Neg Hx     Social History   Socioeconomic History   Marital status: Widowed    Spouse name: Not on file   Number of children: 1   Years of education: Not on file   Highest education level: Not on file  Occupational History   Occupation: Retired  Tobacco Use   Smoking status: Never   Smokeless tobacco: Never  Vaping Use   Vaping status: Never Used  Substance and Sexual Activity   Alcohol use: No   Drug use: No   Sexual activity: Not  on file  Other Topics Concern   Not on file  Social History Narrative   Not on file   Social Drivers of Health   Tobacco Use: Low Risk (07/28/2024)   Patient History    Smoking Tobacco Use: Never    Smokeless Tobacco Use: Never    Passive Exposure: Not on file  Financial Resource Strain: Low Risk (08/06/2023)   Overall Financial Resource Strain (CARDIA)    Difficulty of Paying Living Expenses: Not very hard  Food Insecurity: No Food Insecurity (08/06/2023)   Hunger Vital Sign    Worried About Running Out of Food in the Last Year: Never true    Ran Out of Food in the Last Year: Never true  Transportation Needs: No Transportation Needs (08/06/2023)   PRAPARE - Administrator, Civil Service (Medical): No    Lack of Transportation (Non-Medical): No  Physical Activity: Insufficiently Active (08/06/2023)   Exercise Vital Sign    Days of Exercise per Week: 2 days    Minutes of Exercise per Session: 40 min  Stress: No Stress Concern Present (08/06/2023)   [employer] of Occupational Health - Occupational Stress Questionnaire    Feeling of Stress : Not at all  Social Connections: Moderately Isolated (08/06/2023)   Social Connection and Isolation Panel    Frequency of Communication with Friends and Family: More than three times a week    Frequency of Social Gatherings with Friends and Family: More than three times a week    Attends Religious Services: More than 4 times per year    Active Member of [employer] or Organizations: No    Attends Banker Meetings: Never    Marital Status: Widowed  Intimate Partner Violence: Not At Risk (08/06/2023)   Humiliation, Afraid, Rape, and Kick questionnaire    Fear of Current or Ex-Partner: No    Emotionally Abused: No    Physically Abused: No    Sexually Abused: No  Depression (PHQ2-9): Low Risk (07/28/2024)   Depression (PHQ2-9)    PHQ-2 Score: 0  Alcohol Screen: Low Risk (08/06/2023)   Alcohol Screen    Last Alcohol Screening Score  (AUDIT): 0  Housing: Low Risk (08/06/2023)   Housing Stability Vital Sign    Unable to Pay for Housing in the Last Year: No    Number of Times Moved in the Last Year: 0    Homeless in the Last Year: No  Utilities: Not At Risk (08/06/2023)   AHC Utilities    Threatened with loss of utilities: No  Health Literacy: Adequate Health Literacy (08/06/2023)   B1300 Health Literacy    Frequency of need for help with medical instructions: Never    Outpatient Medications Prior to Visit  Medication Sig Dispense Refill   acyclovir  (ZOVIRAX ) 400 MG tablet Take 1 tablet (400 mg total) by mouth 2 (two) times daily. (Patient taking differently: Take 400 mg by mouth as needed (rash outbreak).) 60 tablet 3   aspirin EC 81 MG tablet Take 81 mg by mouth daily.     Blood Glucose Monitoring Suppl (ACCU-CHEK GUIDE) w/Device KIT daily.     cetirizine  (ZYRTEC ) 10 MG tablet Take 1 tablet (10 mg total) by mouth daily as needed for allergies. 90 tablet 3   Chlorophyll 10 MG TABS Take 1 tablet by mouth daily.     Cholecalciferol (VITAMIN D PO) Take 2,000 Units by mouth daily.      CRESTOR  20 MG tablet Take 1 tablet (20 mg total) by mouth daily. [facility] billed secondary to Medicare to cover copay: ID [id number] BIN [id number] PCN [id number] Group [id number] 90 tablet 3   diclofenac  Sodium (VOLTAREN ) 1 % GEL Apply 4 g topically 4 (four) times daily as needed (joint pain). 350 g 2   fluticasone  (FLONASE ) 50 MCG/ACT nasal spray USE 1 SPRAY IN EACH NOSTRIL EVERY DAY 48 g 1   Lancets (ONETOUCH DELICA PLUS LANCET33G) MISC TEST BLOOD SUGAR TWICE DAILY MORNING AND EVENING 200 each 1   metFORMIN  (GLUCOPHAGE -XR) 500 MG 24 hr tablet TAKE 1 TABLET BY MOUTH TWICE  DAILY 200 tablet 2   metoprolol  (TOPROL -XL) 200 MG 24 hr tablet TAKE 1 TABLET(200 MG) BY MOUTH DAILY 90 tablet 1  Multiple Vitamins-Minerals (COMPLETE ENERGY) TABS Take 1 tablet by mouth daily.     nitroGLYCERIN  (NITROSTAT ) 0.4 MG SL tablet Place 1 tablet (0.4 mg total) under  the tongue every 5 (five) minutes as needed for chest pain. 30 tablet 1   Omega-3 Fatty Acids (FISH OIL) 1000 MG CAPS Take 1 capsule by mouth daily.     omeprazole  (PRILOSEC) 20 MG capsule TAKE 1 CAPSULE BY MOUTH DAILY 100 capsule 2   ONETOUCH VERIO test strip USE TO CHECK BLOOD SUGAR THREE TIMES DAILY BEFORE MEALS 300 strip 3   traMADol  (ULTRAM ) 50 MG tablet Take 50 mg by mouth 2 (two) times daily as needed for pain.     triamcinolone  ointment (KENALOG ) 0.1 % Apply 1 application topically 2 (two) times daily. (Patient taking differently: Apply 1 application  topically as needed (rash).) 80 g 0   valsartan -hydrochlorothiazide  (DIOVAN -HCT) 80-12.5 MG tablet Take 1 tablet by mouth daily. 90 tablet 3   No facility-administered medications prior to visit.    Allergies[1]  Review of Systems  Constitutional:  Negative for chills, fatigue and fever.  HENT:  Negative for congestion, ear pain and sinus pain.   Respiratory:  Negative for cough and shortness of breath.   Cardiovascular:  Negative for chest pain.  Gastrointestinal:  Negative for abdominal pain, constipation, diarrhea, nausea and vomiting.  Musculoskeletal:  Positive for arthralgias, back pain and neck pain. Negative for myalgias.  Neurological:  Negative for headaches.       Occasional tingling       Objective:        07/28/2024    2:51 PM 07/12/2024   10:23 AM 03/29/2024   10:05 AM  Vitals with BMI  Height 4' 11.5 4' 11.5 4' 11.5  Weight 140 lbs 5 oz 139 lbs 137 lbs 13 oz  BMI 27.87 27.62 27.38  Systolic 130 140 881  Diastolic 76 64 72  Pulse 99 80 76    No data found.   Physical Exam Vitals and nursing note reviewed.  Constitutional:      Appearance: Normal appearance.  HENT:     Head: Normocephalic and atraumatic.  Cardiovascular:     Rate and Rhythm: Normal rate and regular rhythm.  Pulmonary:     Effort: Pulmonary effort is normal.     Breath sounds: Normal breath sounds.  Musculoskeletal:      Comments: Mild bilateral trapezius tenderness, no cervical spinal tenderness  Mild lumbar spinal tenderness  Neurological:     General: No focal deficit present.     Mental Status: She is alert and oriented to person, place, and time.     Cranial Nerves: No cranial nerve deficit.     Sensory: No sensory deficit.     Motor: No weakness.     Coordination: Coordination normal.     Gait: Gait normal.     Deep Tendon Reflexes: Reflexes normal.  Psychiatric:        Mood and Affect: Mood normal.     Health Maintenance Due  Topic Date Due   Colonoscopy  06/12/2022   COVID-19 Vaccine (5 - 2025-26 season) 02/29/2024   Medicare Annual Wellness (AWV)  08/05/2024    There are no preventive care reminders to display for this patient.   Lab Results  Component Value Date   TSH 0.693 12/24/2023   Lab Results  Component Value Date   WBC 4.1 07/12/2024   HGB 12.9 07/12/2024   HCT 41.0 07/12/2024   MCV 97 07/12/2024  PLT 307 07/12/2024   Lab Results  Component Value Date   NA 145 (H) 07/12/2024   K 3.9 07/12/2024   CO2 23 07/12/2024   GLUCOSE 112 (H) 07/12/2024   BUN 13 07/12/2024   CREATININE 0.78 07/12/2024   BILITOT 0.3 07/12/2024   ALKPHOS 50 07/12/2024   AST 19 07/12/2024   ALT 10 07/12/2024   PROT 7.4 07/12/2024   ALBUMIN 4.5 07/12/2024   CALCIUM  9.8 07/12/2024   EGFR 78 07/12/2024   Lab Results  Component Value Date   CHOL 199 07/12/2024   Lab Results  Component Value Date   HDL 60 07/12/2024   Lab Results  Component Value Date   LDLCALC 123 (H) 07/12/2024   Lab Results  Component Value Date   TRIG 89 07/12/2024   Lab Results  Component Value Date   CHOLHDL 3.3 07/12/2024   Lab Results  Component Value Date   HGBA1C 7.0 (H) 07/12/2024        Results for orders placed or performed in visit on 07/12/24  CBC with Differential/Platelet   Collection Time: 07/12/24 12:15 PM  Result Value Ref Range   WBC 4.1 3.4 - 10.8 x10E3/uL   RBC 4.24 3.77 -  5.28 x10E6/uL   Hemoglobin 12.9 11.1 - 15.9 g/dL   Hematocrit 58.9 65.9 - 46.6 %   MCV 97 79 - 97 fL   MCH 30.4 26.6 - 33.0 pg   MCHC 31.5 31.5 - 35.7 g/dL   RDW 87.4 88.2 - 84.5 %   Platelets 307 150 - 450 x10E3/uL   Neutrophils 40 Not Estab. %   Lymphs 49 Not Estab. %   Monocytes 8 Not Estab. %   Eos 2 Not Estab. %   Basos 1 Not Estab. %   Neutrophils Absolute 1.6 1.4 - 7.0 x10E3/uL   Lymphocytes Absolute 2.0 0.7 - 3.1 x10E3/uL   Monocytes Absolute 0.3 0.1 - 0.9 x10E3/uL   EOS (ABSOLUTE) 0.1 0.0 - 0.4 x10E3/uL   Basophils Absolute 0.0 0.0 - 0.2 x10E3/uL   Immature Granulocytes 0 Not Estab. %   Immature Grans (Abs) 0.0 0.0 - 0.1 x10E3/uL  Comprehensive metabolic panel with GFR   Collection Time: 07/12/24 12:15 PM  Result Value Ref Range   Glucose 112 (H) 70 - 99 mg/dL   BUN 13 8 - 27 mg/dL   Creatinine, Ser 9.21 0.57 - 1.00 mg/dL   eGFR 78 >40 fO/fpw/8.26   BUN/Creatinine Ratio 17 12 - 28   Sodium 145 (H) 134 - 144 mmol/L   Potassium 3.9 3.5 - 5.2 mmol/L   Chloride 103 96 - 106 mmol/L   CO2 23 20 - 29 mmol/L   Calcium  9.8 8.7 - 10.3 mg/dL   Total Protein 7.4 6.0 - 8.5 g/dL   Albumin 4.5 3.8 - 4.8 g/dL   Globulin, Total 2.9 1.5 - 4.5 g/dL   Bilirubin Total 0.3 0.0 - 1.2 mg/dL   Alkaline Phosphatase 50 49 - 135 IU/L   AST 19 0 - 40 IU/L   ALT 10 0 - 32 IU/L  Hemoglobin A1c   Collection Time: 07/12/24 12:15 PM  Result Value Ref Range   Hgb A1c MFr Bld 7.0 (H) 4.8 - 5.6 %   Est. average glucose Bld gHb Est-mCnc 154 mg/dL  Lipid panel   Collection Time: 07/12/24 12:15 PM  Result Value Ref Range   Cholesterol, Total 199 100 - 199 mg/dL   Triglycerides 89 0 - 149 mg/dL   HDL 60 >60  mg/dL   VLDL Cholesterol Cal 16 5 - 40 mg/dL   LDL Chol Calc (NIH) 876 (H) 0 - 99 mg/dL   Chol/HDL Ratio 3.3 0.0 - 4.4 ratio  Microalbumin / creatinine urine ratio   Collection Time: 07/12/24 12:15 PM  Result Value Ref Range   Creatinine, Urine 87.6 Not Estab. mg/dL   Microalbumin, Urine  8.8 Not Estab. ug/mL   Microalb/Creat Ratio 10 0 - 29 mg/g creat     Assessment & Plan:   Assessment & Plan Paresthesias Could be due to radiculopathy from degenerative cervical and lumbar disc disease. Advised to try biofreeze or salon pas for numbing/ counter irritant effect. Report back if any worsening symptoms or call 911 for any severe symptoms    Degeneration of intervertebral disc of lumbosacral region with discogenic back pain Has chronic low back pain, radiculopathy. Hesitant to take any medications at this time. States she has not taken tramadol  in nearly 3 years. Does not want to be on any muscle relaxants either. Was not keen on gabapentin. States she just wanted reassurance that this was not stroke. Explained to her that since her symptoms are not unilateral, and the only symptoms of tingling was intermittent, with a normal exam today, it is reassuring. But if any severe symptoms, she should go to the ED.     DDD (degenerative disc disease), cervical Intermittent tingling and burning sensation in the neck, shoulders, and low back for two weeks, more pronounced on the left side. No associated pain, but muscle tenderness present. Normal neurological exam with good strength and reflexes.   Differential includes pinched nerve due to known cervical spondylosis and radiculopathy.   Stroke ruled out due to symptom duration and normal exam.  - Monitor symptoms and report if she worsens. - Will consider gabapentin if symptoms become constant and bothersome. - Use Biofreeze or Bengay for symptomatic relief. - Will repeat x-rays and MRIs if symptoms worsen.       History of shoulder surgery with chronic post-surgical changes Chronic post-surgical changes following shoulder surgery two years ago. Persistent tenderness and discoloration at the surgical site, but no abnormal findings on examination. Discoloration likely residual from previous trauma and surgery.  General Health  Maintenance Received flu shot in December at [facility] pharmacy. - Continue routine health maintenance and vaccinations as scheduled.        Body mass index is 27.86 kg/m.SABRA  No orders of the defined types were placed in this encounter.   No orders of the defined types were placed in this encounter.    Follow-up: Return if symptoms worsen or fail to improve.  An After Visit Summary was printed and given to the patient.  Tommy Schimke, MD [facility] [phone]     [1]  Allergies Allergen Reactions   Dairy Aid [Tilactase]     Hives, diarrhea   Lactose Anaphylaxis and Other (See Comments)   Atorvastatin Other (See Comments)    Severe myalgias   Hydrocodone-Acetaminophen     REACTION: sore throat with vicodin   Hydrocodone-Acetaminophen Other (See Comments)    sore throat with vicodin    Milk-Related Compounds Other (See Comments)   Mirabegron Other (See Comments)    Headache    Oxycontin [Oxycodone Hcl]     makes me crazy   Prochlorperazine Edisylate     REACTION: stroke-like symptoms with companzine   Rosuvastatin      Can take brand crestor -severe myalgias   Shellfish Allergy     Umeclidinium-Vilanterol Other (  See Comments)    Lactose allergy    Percodan [Oxycodone-Aspirin] Palpitations    Fast heartbeat with percodan   "

## 2024-07-28 NOTE — Assessment & Plan Note (Signed)
 Intermittent tingling and burning sensation in the neck, shoulders, and low back for two weeks, more pronounced on the left side. No associated pain, but muscle tenderness present. Normal neurological exam with good strength and reflexes.   Differential includes pinched nerve due to known cervical spondylosis and radiculopathy.   Stroke ruled out due to symptom duration and normal exam.  - Monitor symptoms and report if she worsens. - Will consider gabapentin if symptoms become constant and bothersome. - Use Biofreeze or Bengay for symptomatic relief. - Will repeat x-rays and MRIs if symptoms worsen.

## 2024-07-28 NOTE — Patient Instructions (Signed)
" °  VISIT SUMMARY: During your visit, we discussed the tingling sensations you have been experiencing in your neck, waist, and limbs. We also reviewed your history of neck and shoulder surgeries and your current pain management strategies.  YOUR PLAN: CERVICAL SPONDYLOSIS WITH RADICULOPATHY: You have intermittent tingling and burning sensations in your neck, shoulders, and low back, which may be due to a pinched nerve from your cervical spondylosis. -Monitor your symptoms and report any worsening. -Consider using Biofreeze or Bengay for relief. -If symptoms become constant and bothersome, we may consider gabapentin. -We will repeat x-rays and MRIs if your symptoms worsen.  HISTORY OF SHOULDER SURGERY WITH CHRONIC POST-SURGICAL CHANGES: You have persistent tenderness and discoloration at your shoulder surgery site, which is likely residual from previous trauma and surgery. -No specific treatment is needed at this time.  GENERAL HEALTH MAINTENANCE: You received your flu shot in December. -Continue with routine health maintenance and vaccinations as scheduled.    Contains text generated by [employer]bridge.   "

## 2024-08-02 ENCOUNTER — Other Ambulatory Visit: Payer: Self-pay

## 2024-08-02 MED ORDER — CETIRIZINE HCL 10 MG PO TABS: 10.0000 mg | ORAL_TABLET | Freq: Every day | ORAL | 3 refills | Status: AC | PRN

## 2024-08-03 ENCOUNTER — Ambulatory Visit

## 2024-08-03 DIAGNOSIS — E782 Mixed hyperlipidemia: Secondary | ICD-10-CM

## 2024-08-03 DIAGNOSIS — G8929 Other chronic pain: Secondary | ICD-10-CM

## 2024-08-03 NOTE — Progress Notes (Unsigned)
 "  08/03/2024 Name: Mindy Ingram MRN: [id number] DOB: [birth year]  Chief Complaint  Patient presents with   Diabetes   Medication Management    Mindy Ingram is a 79 y.o. year old female who presented for a telephone visit.   They were referred to the pharmacist by their PCP for assistance in managing diabetes.    Subjective:  Care Team: Primary Care Provider: Milon Ingram, Ingram ; Next Scheduled Visit:  Future Appointments  Date Time Provider Department Center  08/03/2024 10:20 AM [facility]  11/15/2024 11:00 AM Milon Cleaves, Ingram [facility]   Lab Results  Component Value Date   HGBA1C 7.0 (H) 07/12/2024   HGBA1C 7.5 (H) 03/29/2024   HGBA1C 7.3 (H) 12/24/2023   HGBA1C 7.6 (H) 09/01/2023   HGBA1C 6.6 (H) 01/29/2023   HGBA1C 7.6 (H) 06/10/2022   HGBA1C 7.4 (H) 02/28/2022   HGBA1C 7.2 (H) 11/19/2021   HGBA1C 6.9 (H) 08/06/2021   HGBA1C 6.8 (H) 04/08/2021   HGBA1C 6.8 (H) 12/25/2020   HGBA1C 6.7 (H) 09/20/2020     Medication Access/Adherence  Current Pharmacy:  [id number] - [place], [place] - [address] [place] [address] Phone: [phone] Fax: [phone]  [facility] - [place], [place] - [address] [place] [address] Phone: [phone] Fax: [phone]   Patient reports affordability concerns with their medications: No  Patient reports access/transportation concerns to their pharmacy: No  Patient reports adherence concerns with their medications:  No     Diabetes:  Current medications: metformin  500mg  BID, did not do well with 850mg  bid. Is okay with tyrying 750mg  bid if needed.    Patient denies hypoglycemic s/sx including dizziness, shakiness, sweating. Patient denies hyperglycemic symptoms including polyuria, polydipsia, polyphagia, nocturia, neuropathy, blurred vision.  Current meal patterns:  - Biggest issue is  late night snacking, will have some popcorn, skinny dip.  - Ok with having almonds, peanuts, etc., as snack   Current physical activity: no formal exercise but will walk to mailbox, stays active in community. Is not sedentary.   Current medication access support: none required.   05/02/24: - Fbg generally 120-160 range, PPD <200.  - has had some health concerns related to family members so had not really established a wlaking routine, however this remains a goal for her.   06/07/24 update: - 87 y.o. cousin had passed 1 yr ago 08/01/2024, making things difficult for patient recently   - dealing with constipation/gas - continues to be active/not sedentary, will consider going to [facility] with Grandson and start walking   08/03/2024 office visit:  - presents for in person OV, did not have log of BG with her at this time. Is happy with her most recent A1c dropping to 7.0, continues with metformin  XR 500mg  BID  - aware of [employer] grant running out in the near future, will need for crestor .    Objective:  Lab Results  Component Value Date   HGBA1C 7.0 (H) 07/12/2024   Lab Results  Component Value Date   HGBA1C 7.0 (H) 07/12/2024   HGBA1C 7.5 (H) 03/29/2024   HGBA1C 7.3 (H) 12/24/2023   HGBA1C 7.6 (H) 09/01/2023   HGBA1C 6.6 (H) 01/29/2023   HGBA1C 7.6 (H) 06/10/2022   HGBA1C 7.4 (H) 02/28/2022   HGBA1C 7.2 (H) 11/19/2021   HGBA1C 6.9 (H) 08/06/2021   HGBA1C 6.8 (H) 04/08/2021   HGBA1C  6.8 (H) 12/25/2020   HGBA1C 6.7 (H) 09/20/2020     Lab Results  Component Value Date   CREATININE 0.78 07/12/2024   BUN 13 07/12/2024   NA 145 (H) 07/12/2024   K 3.9 07/12/2024   CL 103 07/12/2024   CO2 23 07/12/2024    Lab Results  Component Value Date   CHOL 199 07/12/2024   HDL 60 07/12/2024   LDLCALC 123 (H) 07/12/2024   TRIG 89 07/12/2024   CHOLHDL 3.3 07/12/2024    Assessment/Plan:   Diabetes:   06/07/2024 telephone visit: - Currently uncontrolled. - reviewed A1c FBG (160s or  less) - reasonably controlled based off of last OV A1c 7.5%  - Reviewed long term cardiovascular and renal outcomes of uncontrolled blood sugar - Reviewed goal A1c, goal fasting, and goal 2 hour post prandial glucose - Recommend to check glucose twice daily as is fasting and ppd dinner - Maintains on metformin  500 mg BID, would be ok with increase if needed in the near future.  - reviewed dietary changes related to DM and constipation  - will need to reenroll in [employer] grant next month, visit scheduled   08/03/2024 - currently controlled at A1c 7% - reviewed need for financial assistance - approved for [employer] - got patient re-enrolled.  Pharmacy Card Information Sent to Patient.  ID [id number] BIN [id number] PCN [id number] Group [id number]     Ends 08/09/2025 can reenroll 1/10  Lang Sieve, PharmD, BCGP Clinical Pharmacist  [phone]    "

## 2024-08-04 MED ORDER — DICLOFENAC SODIUM 1 % EX GEL: 4.0000 g | Freq: Four times a day (QID) | CUTANEOUS | 2 refills | Status: AC | PRN

## 2024-08-04 MED ORDER — CRESTOR 20 MG PO TABS: ORAL_TABLET | ORAL | 3 refills | Status: AC

## 2024-08-05 ENCOUNTER — Telehealth: Payer: Self-pay

## 2024-08-05 NOTE — Progress Notes (Signed)
" ° °  08/05/2024  Patient ID: Mindy Ingram, female   DOB: [birth year], 79 y.o.   MRN: [id number]  Called patient to update on approval status of [employer] grant, grant information included on Rx to the pharmacy. Scheduled march telephone f/u.   Lang Sieve, PharmD, BCGP Clinical Pharmacist  [phone]  "

## 2024-09-05 ENCOUNTER — Other Ambulatory Visit

## 2024-11-15 ENCOUNTER — Ambulatory Visit: Admitting: Physician Assistant
